# Patient Record
Sex: Female | Born: 1970
Health system: Southern US, Community
[De-identification: ages and names within clinical notes are randomized; demographics above are authoritative.]

## PROBLEM LIST (undated history)

## (undated) DIAGNOSIS — R011 Cardiac murmur, unspecified: Secondary | ICD-10-CM

## (undated) DIAGNOSIS — F32A Depression, unspecified: Secondary | ICD-10-CM

## (undated) DIAGNOSIS — F329 Major depressive disorder, single episode, unspecified: Secondary | ICD-10-CM

## (undated) DIAGNOSIS — I10 Essential (primary) hypertension: Secondary | ICD-10-CM

## (undated) DIAGNOSIS — E1159 Type 2 diabetes mellitus with other circulatory complications: Secondary | ICD-10-CM

## (undated) DIAGNOSIS — K219 Gastro-esophageal reflux disease without esophagitis: Secondary | ICD-10-CM

## (undated) DIAGNOSIS — G35 Multiple sclerosis: Secondary | ICD-10-CM

## (undated) DIAGNOSIS — Z87442 Personal history of urinary calculi: Secondary | ICD-10-CM

## (undated) DIAGNOSIS — E785 Hyperlipidemia, unspecified: Secondary | ICD-10-CM

## (undated) DIAGNOSIS — M1990 Unspecified osteoarthritis, unspecified site: Secondary | ICD-10-CM

## (undated) HISTORY — DX: Hyperlipidemia, unspecified: E78.5

## (undated) HISTORY — PX: EXPLORATORY LAPAROTOMY: SUR591

## (undated) HISTORY — DX: Essential (primary) hypertension: I10

## (undated) HISTORY — DX: Multiple sclerosis: G35

## (undated) HISTORY — PX: WISDOM TOOTH EXTRACTION: SHX21

## (undated) HISTORY — DX: Depression, unspecified: F32.A

## (undated) HISTORY — DX: Major depressive disorder, single episode, unspecified: F32.9

## (undated) HISTORY — PX: LUMBAR DISC SURGERY: SHX700

## (undated) HISTORY — DX: Cardiac murmur, unspecified: R01.1

## (undated) HISTORY — DX: Gastro-esophageal reflux disease without esophagitis: K21.9

## (undated) HISTORY — DX: Type 2 diabetes mellitus with other circulatory complications: E11.59

---

## 1998-05-23 ENCOUNTER — Emergency Department (HOSPITAL_COMMUNITY): Admission: EM | Admit: 1998-05-23 | Discharge: 1998-05-23 | Payer: Self-pay | Admitting: Internal Medicine

## 1998-08-18 ENCOUNTER — Ambulatory Visit (HOSPITAL_COMMUNITY): Admission: RE | Admit: 1998-08-18 | Discharge: 1998-08-18 | Payer: Self-pay | Admitting: Anesthesiology

## 1998-08-18 ENCOUNTER — Encounter: Payer: Self-pay | Admitting: Family Medicine

## 1999-05-05 ENCOUNTER — Encounter (INDEPENDENT_AMBULATORY_CARE_PROVIDER_SITE_OTHER): Payer: Self-pay

## 1999-05-05 ENCOUNTER — Ambulatory Visit (HOSPITAL_COMMUNITY): Admission: RE | Admit: 1999-05-05 | Discharge: 1999-05-05 | Payer: Self-pay | Admitting: Obstetrics and Gynecology

## 1999-05-29 ENCOUNTER — Encounter: Admission: RE | Admit: 1999-05-29 | Discharge: 1999-05-29 | Payer: Self-pay | Admitting: Family Medicine

## 1999-05-29 ENCOUNTER — Encounter: Payer: Self-pay | Admitting: Family Medicine

## 1999-06-02 ENCOUNTER — Encounter: Payer: Self-pay | Admitting: Family Medicine

## 1999-06-02 ENCOUNTER — Ambulatory Visit (HOSPITAL_COMMUNITY): Admission: RE | Admit: 1999-06-02 | Discharge: 1999-06-02 | Payer: Self-pay

## 1999-11-03 ENCOUNTER — Encounter (INDEPENDENT_AMBULATORY_CARE_PROVIDER_SITE_OTHER): Payer: Self-pay

## 1999-11-03 ENCOUNTER — Other Ambulatory Visit: Admission: RE | Admit: 1999-11-03 | Discharge: 1999-11-03 | Payer: Self-pay | Admitting: *Deleted

## 2000-01-15 ENCOUNTER — Other Ambulatory Visit: Admission: RE | Admit: 2000-01-15 | Discharge: 2000-01-15 | Payer: Self-pay | Admitting: *Deleted

## 2000-01-15 ENCOUNTER — Encounter (INDEPENDENT_AMBULATORY_CARE_PROVIDER_SITE_OTHER): Payer: Self-pay

## 2000-07-25 ENCOUNTER — Other Ambulatory Visit: Admission: RE | Admit: 2000-07-25 | Discharge: 2000-07-25 | Payer: Self-pay | Admitting: *Deleted

## 2001-08-29 ENCOUNTER — Other Ambulatory Visit: Admission: RE | Admit: 2001-08-29 | Discharge: 2001-08-29 | Payer: Self-pay | Admitting: *Deleted

## 2002-09-22 ENCOUNTER — Other Ambulatory Visit: Admission: RE | Admit: 2002-09-22 | Discharge: 2002-09-22 | Payer: Self-pay | Admitting: *Deleted

## 2004-07-10 ENCOUNTER — Ambulatory Visit: Payer: Self-pay | Admitting: Internal Medicine

## 2004-08-08 ENCOUNTER — Other Ambulatory Visit: Admission: RE | Admit: 2004-08-08 | Discharge: 2004-08-08 | Payer: Self-pay | Admitting: Obstetrics and Gynecology

## 2004-08-21 ENCOUNTER — Ambulatory Visit: Payer: Self-pay | Admitting: Internal Medicine

## 2004-08-28 ENCOUNTER — Ambulatory Visit: Payer: Self-pay | Admitting: Internal Medicine

## 2005-02-21 ENCOUNTER — Emergency Department (HOSPITAL_COMMUNITY): Admission: EM | Admit: 2005-02-21 | Discharge: 2005-02-21 | Payer: Self-pay | Admitting: Emergency Medicine

## 2005-02-28 ENCOUNTER — Ambulatory Visit: Payer: Self-pay | Admitting: Internal Medicine

## 2005-07-16 ENCOUNTER — Ambulatory Visit: Payer: Self-pay | Admitting: Internal Medicine

## 2006-05-07 ENCOUNTER — Ambulatory Visit: Payer: Self-pay | Admitting: Cardiology

## 2006-05-07 ENCOUNTER — Ambulatory Visit: Payer: Self-pay | Admitting: Internal Medicine

## 2006-07-04 ENCOUNTER — Ambulatory Visit: Payer: Self-pay | Admitting: Internal Medicine

## 2006-07-05 DIAGNOSIS — E119 Type 2 diabetes mellitus without complications: Secondary | ICD-10-CM | POA: Insufficient documentation

## 2006-07-05 DIAGNOSIS — I10 Essential (primary) hypertension: Secondary | ICD-10-CM | POA: Insufficient documentation

## 2006-07-05 DIAGNOSIS — N809 Endometriosis, unspecified: Secondary | ICD-10-CM | POA: Insufficient documentation

## 2006-07-05 DIAGNOSIS — G35 Multiple sclerosis: Secondary | ICD-10-CM | POA: Insufficient documentation

## 2006-07-18 ENCOUNTER — Ambulatory Visit: Payer: Self-pay | Admitting: Internal Medicine

## 2006-07-22 ENCOUNTER — Encounter: Admission: RE | Admit: 2006-07-22 | Discharge: 2006-09-05 | Payer: Self-pay | Admitting: Internal Medicine

## 2006-08-29 ENCOUNTER — Ambulatory Visit: Payer: Self-pay | Admitting: Internal Medicine

## 2006-08-29 DIAGNOSIS — E785 Hyperlipidemia, unspecified: Secondary | ICD-10-CM | POA: Insufficient documentation

## 2006-08-29 LAB — CONVERTED CEMR LAB
Cholesterol, target level: 200 mg/dL
HDL goal, serum: 40 mg/dL
LDL Goal: 100 mg/dL

## 2006-09-03 LAB — CONVERTED CEMR LAB
ALT: 37 units/L — ABNORMAL HIGH (ref 0–35)
AST: 40 units/L — ABNORMAL HIGH (ref 0–37)
Albumin: 3.3 g/dL — ABNORMAL LOW (ref 3.5–5.2)
Alkaline Phosphatase: 64 units/L (ref 39–117)
BUN: 10 mg/dL (ref 6–23)
Bilirubin, Direct: 0.1 mg/dL (ref 0.0–0.3)
CO2: 30 meq/L (ref 19–32)
Calcium: 9.2 mg/dL (ref 8.4–10.5)
Chloride: 104 meq/L (ref 96–112)
Cholesterol: 163 mg/dL (ref 0–200)
Creatinine, Ser: 0.9 mg/dL (ref 0.4–1.2)
Creatinine,U: 144.5 mg/dL
GFR calc Af Amer: 91 mL/min
GFR calc non Af Amer: 75 mL/min
Glucose, Bld: 170 mg/dL — ABNORMAL HIGH (ref 70–99)
HDL: 37.5 mg/dL — ABNORMAL LOW (ref >39.0)
Hgb A1c MFr Bld: 8.3 % — ABNORMAL HIGH (ref 4.6–6.0)
LDL Cholesterol: 86 mg/dL (ref 0–99)
Microalb Creat Ratio: 13.8 mg/g (ref 0.0–30.0)
Microalb, Ur: 2 mg/dL — ABNORMAL HIGH (ref 0.0–1.9)
Potassium: 4.3 meq/L (ref 3.5–5.1)
Sodium: 141 meq/L (ref 135–145)
Total Bilirubin: 0.9 mg/dL (ref 0.3–1.2)
Total CHOL/HDL Ratio: 4.3
Total Protein: 6.4 g/dL (ref 6.0–8.3)
Triglycerides: 198 mg/dL — ABNORMAL HIGH (ref 0–149)
VLDL: 40 mg/dL (ref 0–40)

## 2007-03-04 ENCOUNTER — Encounter: Admission: RE | Admit: 2007-03-04 | Discharge: 2007-03-04 | Payer: Self-pay | Admitting: Allergy and Immunology

## 2007-05-02 ENCOUNTER — Telehealth (INDEPENDENT_AMBULATORY_CARE_PROVIDER_SITE_OTHER): Payer: Self-pay | Admitting: *Deleted

## 2007-12-05 ENCOUNTER — Encounter: Payer: Self-pay | Admitting: Internal Medicine

## 2008-02-25 ENCOUNTER — Ambulatory Visit: Payer: Self-pay | Admitting: Internal Medicine

## 2008-02-25 DIAGNOSIS — F32A Depression, unspecified: Secondary | ICD-10-CM | POA: Insufficient documentation

## 2008-02-25 DIAGNOSIS — F329 Major depressive disorder, single episode, unspecified: Secondary | ICD-10-CM

## 2008-02-26 LAB — CONVERTED CEMR LAB
ALT: 27 units/L (ref 0–35)
AST: 28 units/L (ref 0–37)
BUN: 12 mg/dL (ref 6–23)
CO2: 30 meq/L (ref 19–32)
Calcium: 8.5 mg/dL (ref 8.4–10.5)
Chloride: 107 meq/L (ref 96–112)
Cholesterol: 175 mg/dL (ref 0–200)
Creatinine, Ser: 0.8 mg/dL (ref 0.4–1.2)
GFR calc Af Amer: 103 mL/min
GFR calc non Af Amer: 85 mL/min
Glucose, Bld: 110 mg/dL — ABNORMAL HIGH (ref 70–99)
HDL: 46.1 mg/dL (ref >39.0)
Hgb A1c MFr Bld: 6.6 % — ABNORMAL HIGH (ref 4.6–6.0)
LDL Cholesterol: 105 mg/dL — ABNORMAL HIGH (ref 0–99)
Potassium: 3.9 meq/L (ref 3.5–5.1)
Sodium: 141 meq/L (ref 135–145)
Total CHOL/HDL Ratio: 3.8
Triglycerides: 119 mg/dL (ref 0–149)
VLDL: 24 mg/dL (ref 0–40)

## 2008-03-01 ENCOUNTER — Ambulatory Visit: Payer: Self-pay | Admitting: Gastroenterology

## 2008-03-08 ENCOUNTER — Ambulatory Visit: Payer: Self-pay | Admitting: Gastroenterology

## 2008-03-09 ENCOUNTER — Telehealth: Payer: Self-pay | Admitting: Gastroenterology

## 2008-04-05 ENCOUNTER — Ambulatory Visit: Payer: Self-pay | Admitting: Gastroenterology

## 2008-05-10 ENCOUNTER — Encounter: Payer: Self-pay | Admitting: Internal Medicine

## 2008-06-29 ENCOUNTER — Ambulatory Visit: Payer: Self-pay | Admitting: Internal Medicine

## 2008-06-30 LAB — CONVERTED CEMR LAB
ALT: 27 units/L (ref 0–35)
AST: 26 units/L (ref 0–37)
Albumin: 3.1 g/dL — ABNORMAL LOW (ref 3.5–5.2)
Alkaline Phosphatase: 61 units/L (ref 39–117)
BUN: 12 mg/dL (ref 6–23)
Bilirubin, Direct: 0 mg/dL (ref 0.0–0.3)
CO2: 30 meq/L (ref 19–32)
Calcium: 8.9 mg/dL (ref 8.4–10.5)
Chloride: 103 meq/L (ref 96–112)
Cholesterol: 166 mg/dL (ref 0–200)
Creatinine, Ser: 0.8 mg/dL (ref 0.4–1.2)
GFR calc non Af Amer: 84.91 mL/min (ref >60)
Glucose, Bld: 136 mg/dL — ABNORMAL HIGH (ref 70–99)
HDL: 45.6 mg/dL (ref >39.00)
Hgb A1c MFr Bld: 6.6 % — ABNORMAL HIGH (ref 4.6–6.5)
LDL Cholesterol: 84 mg/dL (ref 0–99)
Potassium: 4.1 meq/L (ref 3.5–5.1)
Sodium: 143 meq/L (ref 135–145)
TSH: 4.79 microintl units/mL (ref 0.35–5.50)
Total Bilirubin: 0.6 mg/dL (ref 0.3–1.2)
Total CHOL/HDL Ratio: 4
Total Protein: 6.7 g/dL (ref 6.0–8.3)
Triglycerides: 180 mg/dL — ABNORMAL HIGH (ref 0.0–149.0)
VLDL: 36 mg/dL (ref 0.0–40.0)

## 2008-08-11 ENCOUNTER — Telehealth: Payer: Self-pay | Admitting: Internal Medicine

## 2008-08-13 ENCOUNTER — Telehealth: Payer: Self-pay | Admitting: Internal Medicine

## 2008-08-13 ENCOUNTER — Ambulatory Visit: Payer: Self-pay | Admitting: Internal Medicine

## 2008-09-17 ENCOUNTER — Ambulatory Visit: Payer: Self-pay | Admitting: Internal Medicine

## 2008-09-29 ENCOUNTER — Ambulatory Visit: Payer: Self-pay | Admitting: Family Medicine

## 2008-09-29 LAB — CONVERTED CEMR LAB: Rapid Strep: NEGATIVE

## 2008-10-22 ENCOUNTER — Ambulatory Visit: Payer: Self-pay | Admitting: Internal Medicine

## 2008-12-15 LAB — HM DIABETES EYE EXAM

## 2009-01-18 ENCOUNTER — Ambulatory Visit: Payer: Self-pay | Admitting: Internal Medicine

## 2009-01-18 LAB — CONVERTED CEMR LAB
CO2: 26 meq/L (ref 19–32)
Chloride: 105 meq/L (ref 96–112)
Glucose, Bld: 171 mg/dL — ABNORMAL HIGH (ref 70–99)
HDL: 43.2 mg/dL (ref >39.00)
Potassium: 4.6 meq/L (ref 3.5–5.1)
Sodium: 139 meq/L (ref 135–145)
Total Bilirubin: 0.7 mg/dL (ref 0.3–1.2)
Total CHOL/HDL Ratio: 4
VLDL: 37.8 mg/dL (ref 0.0–40.0)

## 2009-01-24 ENCOUNTER — Ambulatory Visit: Payer: Self-pay | Admitting: Internal Medicine

## 2009-06-07 ENCOUNTER — Ambulatory Visit: Payer: Self-pay | Admitting: Internal Medicine

## 2009-06-07 LAB — CONVERTED CEMR LAB
AST: 24 units/L (ref 0–37)
Albumin: 3.2 g/dL — ABNORMAL LOW (ref 3.5–5.2)
CO2: 31 meq/L (ref 19–32)
Calcium: 8.5 mg/dL (ref 8.4–10.5)
Chloride: 105 meq/L (ref 96–112)
HDL: 42.2 mg/dL (ref >39.00)
Hgb A1c MFr Bld: 7.4 % — ABNORMAL HIGH (ref 4.6–6.5)
LDL Cholesterol: 91 mg/dL (ref 0–99)
Sodium: 141 meq/L (ref 135–145)
Total CHOL/HDL Ratio: 4
Triglycerides: 159 mg/dL — ABNORMAL HIGH (ref 0.0–149.0)

## 2009-06-14 ENCOUNTER — Ambulatory Visit: Payer: Self-pay | Admitting: Internal Medicine

## 2009-07-05 ENCOUNTER — Telehealth: Payer: Self-pay | Admitting: Internal Medicine

## 2009-08-04 ENCOUNTER — Encounter: Payer: Self-pay | Admitting: Internal Medicine

## 2009-10-13 ENCOUNTER — Ambulatory Visit: Payer: Self-pay | Admitting: Internal Medicine

## 2009-10-13 LAB — CONVERTED CEMR LAB
Albumin: 3.3 g/dL — ABNORMAL LOW (ref 3.5–5.2)
BUN: 13 mg/dL (ref 6–23)
Bilirubin, Direct: 0.1 mg/dL (ref 0.0–0.3)
CO2: 29 meq/L (ref 19–32)
Calcium: 9 mg/dL (ref 8.4–10.5)
Cholesterol: 173 mg/dL (ref 0–200)
Creatinine, Ser: 0.9 mg/dL (ref 0.4–1.2)
Direct LDL: 112.1 mg/dL
Hgb A1c MFr Bld: 7.3 % — ABNORMAL HIGH (ref 4.6–6.5)
Total CHOL/HDL Ratio: 4
Total Protein: 6.6 g/dL (ref 6.0–8.3)
Triglycerides: 215 mg/dL — ABNORMAL HIGH (ref 0.0–149.0)

## 2009-10-21 ENCOUNTER — Ambulatory Visit: Payer: Self-pay | Admitting: Internal Medicine

## 2009-10-21 DIAGNOSIS — L919 Hypertrophic disorder of the skin, unspecified: Secondary | ICD-10-CM

## 2009-10-21 DIAGNOSIS — L909 Atrophic disorder of skin, unspecified: Secondary | ICD-10-CM | POA: Insufficient documentation

## 2010-01-15 LAB — HM DIABETES EYE EXAM

## 2010-02-14 NOTE — Assessment & Plan Note (Signed)
Summary: 4 MNTH ROV//SLM   Vital Signs:  Patient profile:   40 year old female Weight:      216 pounds Temp:     98.2 degrees F oral BP sitting:   122 / 90  (left arm) Cuff size:   large  Vitals Entered By: Sid Falcon LPN (October 21, 2009 8:15 AM)  Primary Care Provider:  [name], MD   History of Present Illness:  Follow-Up Visit      This is a 40 year old woman who presents for Follow-up visit.  The patient denies chest pain and palpitations.  Since the last visit the patient notes no new problems or concerns.  The patient reports taking meds as prescribed but not monitoring blood sugars.  When questioned about possible medication side effects, the patient notes none.  doesn't check CBGs regularly.  All other systems reviewed and were negative   Current Problems (verified): 1)  Depression  (ICD-311) 2)  Hyperlipidemia Nec/nos  (ICD-272.4) 3)  Endometriosis Nos  (ICD-617.9) 4)  Multiple Sclerosis  (ICD-340) 5)  Hypertension  (ICD-401.9) 6)  Diabetes Mellitus, Type II  (ICD-250.00)  Current Medications (verified): 1)  Cymbalta 60 Mg Cpep (Duloxetine Hcl) .... Take 1 Capsule By Mouth At Bedtime 2)  Hydrochlorothiazide 25 Mg Tabs (Hydrochlorothiazide) .... Take 1 Tablet By Mouth Every Morning 3)  Rebif 44 Mcg/0.78ml  Soln (Interferon Beta-1a) .... M-W-F As Directed 1/2 At Present 4)  Lybrel 90-20 Mcg  Tabs (Levonorgestrel-Ethinyl Estrad) .... One Daily 5)  Metformin Hcl 1000 Mg Tabs (Metformin Hcl) .... Take 1 Tablet By Mouth At Bedtime 6)  Provigil 200 Mg Tabs (Modafinil) .... As Needed 7)  Fexofenadine Hcl 180 Mg Tabs (Fexofenadine Hcl) .Marland Kitchen.. 1 Once Daily As Needed Allergies 8)  Insupen Ultrafin 29g X 12mm Misc (Insulin Pen Needle) 9)  Lantus Solostar 100 Unit/ml  Soln (Insulin Glargine) .... Use As Directed--38units Once Daily Subcutaneous 10)  Amlodipine Besylate 10 Mg  Tabs (Amlodipine Besylate) .... Once Daily 11)  Benazepril Hcl 20 Mg  Tabs (Benazepril Hcl) .Marland Kitchen.. 1  By Mouth Daily 12)  Victoza 18 Mg/1ml Soln (Liraglutide) .... 0.6mg  Subcutaneously Once Daily For One Week and Then 1.2 Mg Subcutaneously Once Daily  Allergies (verified): No Known Drug Allergies  Physical Exam  General:  alert and well-developed.   Head:  normocephalic and atraumatic.   Eyes:  pupils equal and pupils round.   Ears:  R ear normal and L ear normal.   Neck:  supple and full ROM.   Chest Wall:  No deformities, masses, or tenderness noted. Lungs:  normal respiratory effort and no intercostal retractions.   Heart:  normal rate and regular rhythm.   Abdomen:  Bowel sounds positive,abdomen soft and non-tender without masses, organomegaly or hernias noted.  overweight Msk:  No deformity or scoliosis noted of thoracic or lumbar spine.   Neurologic:  cranial nerves II-XII intact and gait normal.   Skin:  turgor normal and color normal.     Impression & Recommendations:  Problem # 1:  DEPRESSION (ICD-311) doing well  continue current medications  Her updated medication list for this problem includes:    Cymbalta 60 Mg Cpep (Duloxetine hcl) .Marland Kitchen... Take 1 capsule by mouth at bedtime  Problem # 2:  HYPERLIPIDEMIA NEC/NOS (ICD-272.4) no meds and adequate control Labs Reviewed: SGOT: 23 (10/13/2009)   SGPT: 23 (10/13/2009)  Lipid Goals: Chol Goal: 200 (08/29/2006)   HDL Goal: 40 (08/29/2006)   LDL Goal: 100 (08/29/2006)  TG Goal: 150 (08/29/2006)  Prior 10 Yr Risk Heart Disease: Not enough information (08/29/2006)   HDL:39.00 (10/13/2009), 42.20 (06/07/2009)  LDL:91 (06/07/2009), 104 (04/54/0981)  Chol:173 (10/13/2009), 165 (06/07/2009)  Trig:215.0 (10/13/2009), 159.0 (06/07/2009)  Problem # 3:  MULTIPLE SCLEROSIS (ICD-340) followed by neurology  Problem # 4:  DIABETES MELLITUS, TYPE II (ICD-250.00) adequate control continue current medications  Her updated medication list for this problem includes:    Metformin Hcl 1000 Mg Tabs (Metformin hcl) .Marland Kitchen... Take 1 tablet  by mouth at bedtime    Lantus Solostar 100 Unit/ml Soln (Insulin glargine) ..... Use as directed--44 units once daily subcutaneous    Benazepril Hcl 20 Mg Tabs (Benazepril hcl) .Marland Kitchen... 1 by mouth daily    Victoza 18 Mg/34ml Soln (Liraglutide) .Marland Kitchen... 0.6mg  subcutaneously once daily for one week and then 1.2 mg subcutaneously once daily  Labs Reviewed: Creat: 0.9 (10/13/2009)     Last Eye Exam: normalpt's report (12/15/2008) Reviewed HgBA1c results: 7.3 (10/13/2009)  7.4 (06/07/2009)  Problem # 5:  SKIN TAG (ICD-701.9) excised in office  Complete Medication List: 1)  Cymbalta 60 Mg Cpep (Duloxetine hcl) .... Take 1 capsule by mouth at bedtime 2)  Hydrochlorothiazide 25 Mg Tabs (Hydrochlorothiazide) .... Take 1 tablet by mouth every morning 3)  Rebif 44 Mcg/0.53ml Soln (Interferon beta-1a) .... M-w-f as directed 1/2 at present 4)  Lybrel 90-20 Mcg Tabs (Levonorgestrel-ethinyl estrad) .... One daily 5)  Metformin Hcl 1000 Mg Tabs (Metformin hcl) .... Take 1 tablet by mouth at bedtime 6)  Provigil 200 Mg Tabs (Modafinil) .... As needed 7)  Fexofenadine Hcl 180 Mg Tabs (Fexofenadine hcl) .Marland Kitchen.. 1 once daily as needed allergies 8)  Insupen Ultrafin 29g X 12mm Misc (Insulin pen needle) 9)  Lantus Solostar 100 Unit/ml Soln (Insulin glargine) .... Use as directed--44 units once daily subcutaneous 10)  Amlodipine Besylate 10 Mg Tabs (Amlodipine besylate) .... Once daily 11)  Benazepril Hcl 20 Mg Tabs (Benazepril hcl) .Marland Kitchen.. 1 by mouth daily 12)  Victoza 18 Mg/2ml Soln (Liraglutide) .... 0.6mg  subcutaneously once daily for one week and then 1.2 mg subcutaneously once daily  Patient Instructions: 1)  Please schedule a follow-up appointment in 4 months. 2)  labs one week prior to visit 3)  lipids---272.4 4)  lfts-995.2 5)  bmet-995.2 6)  A1C-250.02 7)

## 2010-02-14 NOTE — Assessment & Plan Note (Signed)
Summary: 3 mo rov/mm   Vital Signs:  Patient profile:   40 year old female Weight:      220 pounds Temp:     97.8 degrees F Pulse rate:   76 / minute Resp:     12 per minute BP sitting:   128 / 94  (left arm)  Vitals Entered By: Gladis Riffle, RN (January 24, 2009 8:43 AM)  Serial Vital Signs/Assessments:  Time      Position  BP       Pulse  Resp  Temp     By                     120/84                         [name] MD    Primary Care Provider:  [name], MD   History of Present Illness:  Follow-Up Visit      This is a 40 year old woman who presents for Follow-up visit.  The patient denies chest pain, palpitations, dizziness, syncope, edema, SOB, DOE, PND, and orthopnea.  Since the last visit the patient notes being seen by a specialist.  The patient reports taking meds as prescribed.  When questioned about possible medication side effects, the patient notes none.    Sees Dr. Epimenio Foot regularly for MS follow up she tells me that LFTS have been elevated with transaminases in the "90s"  All other systems reviewed and were negative   Preventive Screening-Counseling & Management  Alcohol-Tobacco     Alcohol drinks/day: <1     Smoking Status: never  Current Problems (verified): 1)  Depression  (ICD-311) 2)  Hyperlipidemia Nec/nos  (ICD-272.4) 3)  Family History Diabetes 1st Degree Relative  (ICD-V18.0) 4)  Family History Breast Cancer 1st Degree Relative <50  (ICD-V16.3) 5)  Endometriosis Nos  (ICD-617.9) 6)  Multiple Sclerosis  (ICD-340) 7)  Hypertension  (ICD-401.9) 8)  Diabetes Mellitus, Type II  (ICD-250.00)  Current Medications (verified): 1)  Cymbalta 60 Mg Cpep (Duloxetine Hcl) .... Take 1 Capsule By Mouth At Bedtime 2)  Hydrochlorothiazide 25 Mg Tabs (Hydrochlorothiazide) .... Take 1 Tablet By Mouth Every Morning 3)  Lotrel 10-20 Mg Caps (Amlodipine Besy-Benazepril Hcl) .... Take 1 Capsule By Mouth Once A Day 4)  Rebif 44 Mcg/0.41ml  Soln (Interferon  Beta-1a) .... M-W-F As Directed 1/2 At Present 5)  Lybrel 90-20 Mcg  Tabs (Levonorgestrel-Ethinyl Estrad) .... One Daily 6)  Metformin Hcl 1000 Mg Tabs (Metformin Hcl) .... Take 1 Tablet By Mouth At Bedtime 7)  Provigil 200 Mg Tabs (Modafinil) .... As Needed 8)  Fexofenadine Hcl 180 Mg Tabs (Fexofenadine Hcl) .Marland Kitchen.. 1 Once Daily As Needed Allergies 9)  Insupen Ultrafin 29g X 12mm Misc (Insulin Pen Needle) 10)  Lantus Solostar 100 Unit/ml  Soln (Insulin Glargine) .... Use As Directed--26u Once Daily Subcutaneous  Allergies (verified): No Known Drug Allergies  Comments:  Nurse/Medical Assistant: 3 month rov--CBGs not done at home as needs battery--labs done  The patient's medications and allergies were reviewed with the patient and were updated in the Medication and Allergy Lists. Gladis Riffle, RN (January 24, 2009 8:44 AM)  Past History:  Past Medical History: Last updated: 02/25/2008 Diabetes mellitus, type II Hypertension multiple sclerosis endometriosis Depression  Past Surgical History: Last updated: 07/05/2006 discectomy LS spine wisdom teeth Laparotomy-exploratory  Family History: Last updated: 03/01/2008 mother-dm, breast ca Family History Breast cancer 1st degree relative <  50 Family History Diabetes 1st degree relative father-died progressive supranuclear palsy 41 yo, dm No FH of Colon Cancer:  Social History: Last updated: 04/05/2008 Occupation: [employer] Single Never Smoked Alcohol use-yes Regular exercise-no Illicit Drug Use - no  Risk Factors: Alcohol Use: <1 (01/24/2009) Exercise: no (08/29/2006)  Risk Factors: Smoking Status: never (01/24/2009)  Physical Exam  General:  patient is alert nontoxic in appearance Head:  normocephalic and atraumatic.   Ears:  R ear normal and L ear normal.   Neck:  supple with tender enlarged anterior cervical nodes bilaterally Lungs:  Normal respiratory effort, chest expands symmetrically. Lungs are clear  to auscultation, no crackles or wheezes. Heart:  Normal rate and regular rhythm. S1 and S2 normal without gallop, murmur, click, rub or other extra sounds. Abdomen:  Bowel sounds positive,abdomen soft and non-tender without masses, organomegaly or hernias noted.  overweight Msk:  No deformity or scoliosis noted of thoracic or lumbar spine.   Pulses:  R radial normal and L radial normal.   Skin:  turgor normal and color normal.    Diabetes Management Exam:    Eye Exam:       Eye Exam done elsewhere          Date: 12/15/2008          Results: normalpt's report          Done by: ophthal   Impression & Recommendations:  Problem # 1:  DIABETES MELLITUS, TYPE II (ICD-250.00) Assessment Deteriorated she has not done well with diet and exercise she is going to reemphasize diet I will not change meds Her updated medication list for this problem includes:    Lotrel 10-20 Mg Caps (Amlodipine besy-benazepril hcl) .Marland Kitchen... Take 1 capsule by mouth once a day    Metformin Hcl 1000 Mg Tabs (Metformin hcl) .Marland Kitchen... Take 1 tablet by mouth at bedtime    Lantus Solostar 100 Unit/ml Soln (Insulin glargine) ..... Use as directed--26u once daily subcutaneous  Problem # 2:  MULTIPLE SCLEROSIS (ICD-340) followed by dr Epimenio Foot  Problem # 3:  HYPERTENSION (ICD-401.9)  see serial assessment Her updated medication list for this problem includes:    Hydrochlorothiazide 25 Mg Tabs (Hydrochlorothiazide) .Marland Kitchen... Take 1 tablet by mouth every morning    Lotrel 10-20 Mg Caps (Amlodipine besy-benazepril hcl) .Marland Kitchen... Take 1 capsule by mouth once a day  BP today: 128/94 Prior BP: 122/64 (10/22/2008)  Prior 10 Yr Risk Heart Disease: Not enough information (08/29/2006)  Labs Reviewed: K+: 4.6 (01/18/2009) Creat: : 0.8 (01/18/2009)   Chol: 185 (01/18/2009)   HDL: 43.20 (01/18/2009)   LDL: 104 (01/18/2009)   TG: 189.0 (01/18/2009)  Problem # 4:  HYPERLIPIDEMIA NEC/NOS (ICD-272.4) discussed potential need for  treatment Labs Reviewed: SGOT: 40 (01/18/2009)   SGPT: 43 (01/18/2009)  Lipid Goals: Chol Goal: 200 (08/29/2006)   HDL Goal: 40 (08/29/2006)   LDL Goal: 100 (08/29/2006)   TG Goal: 150 (08/29/2006)  Prior 10 Yr Risk Heart Disease: Not enough information (08/29/2006)   HDL:43.20 (01/18/2009), 45.60 (06/29/2008)  LDL:104 (01/18/2009), 84 (28/41/3244)  Chol:185 (01/18/2009), 166 (06/29/2008)  Trig:189.0 (01/18/2009), 180.0 (06/29/2008)  Complete Medication List: 1)  Cymbalta 60 Mg Cpep (Duloxetine hcl) .... Take 1 capsule by mouth at bedtime 2)  Hydrochlorothiazide 25 Mg Tabs (Hydrochlorothiazide) .... Take 1 tablet by mouth every morning 3)  Lotrel 10-20 Mg Caps (Amlodipine besy-benazepril hcl) .... Take 1 capsule by mouth once a day 4)  Rebif 44 Mcg/0.43ml Soln (Interferon beta-1a) .... M-w-f as directed 1/2  at present 5)  Lybrel 90-20 Mcg Tabs (Levonorgestrel-ethinyl estrad) .... One daily 6)  Metformin Hcl 1000 Mg Tabs (Metformin hcl) .... Take 1 tablet by mouth at bedtime 7)  Provigil 200 Mg Tabs (Modafinil) .... As needed 8)  Fexofenadine Hcl 180 Mg Tabs (Fexofenadine hcl) .Marland Kitchen.. 1 once daily as needed allergies 9)  Insupen Ultrafin 29g X 12mm Misc (Insulin pen needle) 10)  Lantus Solostar 100 Unit/ml Soln (Insulin glargine) .... Use as directed--26u once daily subcutaneous  Patient Instructions: 1)  Please schedule a follow-up appointment in 4 months. 2)  labs one week prior to visit 3)  lipids---272.4 4)  lfts-995.2 5)  bmet-995.2 6)  A1C-250.02 7)

## 2010-02-14 NOTE — Progress Notes (Signed)
Summary: Victoza  Phone Note Call from Patient   Caller: Patient Call For: [name] MD Summary of Call: [phone] Pt went to a DM class and would like to try Victoza if Dr. Cato Mulligan would agree? Initial call taken by: Lynann Beaver CMA,  July 05, 2009 3:08 PM  Follow-up for Phone Call        have her review Byetta---I use it more frequently Follow-up by: [name] MD,  July 06, 2009 8:14 AM     Appended Document: Victoza LMTCB  Appended Document: Victoza Pt advised.  Appended Document: Victoza Pt would still like to try victoza as byetta is two times a day and said to cause more nausea.  Pharmacy is [facility].  Appended Document: Victoza    Clinical Lists Changes  Medications: Added new medication of VICTOZA 18 MG/3ML SOLN (LIRAGLUTIDE) 0.6mg  Subcutaneously once daily for one week and then 1.2 mg Subcutaneously once daily - Signed Rx of VICTOZA 18 MG/3ML SOLN (LIRAGLUTIDE) 0.6mg  Subcutaneously once daily for one week and then 1.2 mg Subcutaneously once daily;  #1 x PRN;  Signed;  Entered by: [name] MD;  Authorized by: [name] MD;  Method used: [facility]ically to [facility]  [phone]*, [address], [place], [place]  [address], Ph: [phone] or [phone], Fax: [phone]    Prescriptions: VICTOZA 18 MG/3ML SOLN (LIRAGLUTIDE) 0.6mg  Subcutaneously once daily for one week and then 1.2 mg Subcutaneously once daily  #1 x PRN   Entered and Authorized by:   [name] MD   Signed by:   [name] MD on 07/08/2009   Method used:   [facility]ically to        [facility]  [phone]* (retail)       [address] [place], [place]  [address]       Ph: [phone] or [phone]       Fax: [phone]   RxID:   [id number]

## 2010-02-14 NOTE — Assessment & Plan Note (Signed)
Summary: 4 month rov/njr   Vital Signs:  Patient profile:   40 year old female Weight:      221 pounds Temp:     98.2 degrees F oral Pulse rate:   81 / minute Resp:     12 per minute BP sitting:   110 / 80  Vitals Entered By: Lynann Beaver CMA (Jun 14, 2009 8:31 AM) CC: 4 month rechek Is Patient Diabetic? Yes Pain Assessment Patient in pain? no        Primary Care Provider:  [name], MD  CC:  4 month rechek.  History of Present Illness:  Follow-Up Visit      This is a 40 year old woman who presents for Follow-up visit.  The patient denies chest pain and palpitations.  Since the last visit the patient notes no new problems or concerns and being seen by a specialist.  The patient reports taking meds as prescribed and monitoring blood sugars.  When questioned about possible medication side effects, the patient notes none.  Has regular f/u with neurology  All other systems reviewed and were negative except for chronic fatigue and chronic neurologic complaints she relates to MS  All other systems reviewed and were negative   Current Problems (verified): 1)  Depression  (ICD-311) 2)  Hyperlipidemia Nec/nos  (ICD-272.4) 3)  Family History Diabetes 1st Degree Relative  (ICD-V18.0) 4)  Family History Breast Cancer 1st Degree Relative <50  (ICD-V16.3) 5)  Endometriosis Nos  (ICD-617.9) 6)  Multiple Sclerosis  (ICD-340) 7)  Hypertension  (ICD-401.9) 8)  Diabetes Mellitus, Type II  (ICD-250.00)  Current Medications (verified): 1)  Cymbalta 60 Mg Cpep (Duloxetine Hcl) .... Take 1 Capsule By Mouth At Bedtime 2)  Hydrochlorothiazide 25 Mg Tabs (Hydrochlorothiazide) .... Take 1 Tablet By Mouth Every Morning 3)  Lotrel 10-20 Mg Caps (Amlodipine Besy-Benazepril Hcl) .... Take 1 Capsule By Mouth Once A Day 4)  Rebif 44 Mcg/0.51ml  Soln (Interferon Beta-1a) .... M-W-F As Directed 1/2 At Present 5)  Lybrel 90-20 Mcg  Tabs (Levonorgestrel-Ethinyl Estrad) .... One Daily 6)  Metformin  Hcl 1000 Mg Tabs (Metformin Hcl) .... Take 1 Tablet By Mouth At Bedtime 7)  Provigil 200 Mg Tabs (Modafinil) .... As Needed 8)  Fexofenadine Hcl 180 Mg Tabs (Fexofenadine Hcl) .Marland Kitchen.. 1 Once Daily As Needed Allergies 9)  Insupen Ultrafin 29g X 12mm Misc (Insulin Pen Needle) 10)  Lantus Solostar 100 Unit/ml  Soln (Insulin Glargine) .... Use As Directed--38units Once Daily Subcutaneous  Allergies (verified): No Known Drug Allergies  Past History:  Past Medical History: Last updated: 02/25/2008 Diabetes mellitus, type II Hypertension multiple sclerosis endometriosis Depression  Past Surgical History: Last updated: 07/05/2006 discectomy LS spine wisdom teeth Laparotomy-exploratory  Family History: Last updated: 03/01/2008 mother-dm, breast ca Family History Breast cancer 1st degree relative <50 Family History Diabetes 1st degree relative father-died progressive supranuclear palsy 41 yo, dm No FH of Colon Cancer:  Social History: Last updated: 04/05/2008 Occupation: [employer] fast [employer] Single Never Smoked Alcohol use-yes Regular exercise-no Illicit Drug Use - no  Risk Factors: Alcohol Use: <1 (01/24/2009) Exercise: no (08/29/2006)  Risk Factors: Smoking Status: never (01/24/2009)  Physical Exam  General:  patient is alert nontoxic in appearance Head:  normocephalic and atraumatic.   Eyes:  pupils equal and pupils round.   Ears:  R ear normal and L ear normal.   Neck:  supple and full ROM.   Lungs:  normal respiratory effort and no intercostal retractions.   Heart:  normal rate and regular rhythm.   Abdomen:  Bowel sounds positive,abdomen soft and non-tender without masses, organomegaly or hernias noted.  overweight Msk:  No deformity or scoliosis noted of thoracic or lumbar spine.   Neurologic:  cranial nerves II-XII intact and gait normal.   Skin:  turgor normal and color normal.   Cervical Nodes:  no anterior cervical adenopathy and no posterior cervical  adenopathy.   Psych:  normally interactive and good eye contact.     Impression & Recommendations:  Problem # 1:  MULTIPLE SCLEROSIS (ICD-340) has regular f/u with neurology  Problem # 2:  HYPERTENSION (ICD-401.9) controlled  continue current medications  The following medications were removed from the medication list:    Lotrel 10-20 Mg Caps (Amlodipine besy-benazepril hcl) .Marland Kitchen... Take 1 capsule by mouth once a day Her updated medication list for this problem includes:    Hydrochlorothiazide 25 Mg Tabs (Hydrochlorothiazide) .Marland Kitchen... Take 1 tablet by mouth every morning    Amlodipine Besylate 10 Mg Tabs (Amlodipine besylate) ..... Once daily    Benazepril Hcl 20 Mg Tabs (Benazepril hcl) .Marland Kitchen... 1 by mouth daily  BP today: 110/80 Prior BP: 128/94 (01/24/2009)  Prior 10 Yr Risk Heart Disease: Not enough information (08/29/2006)  Labs Reviewed: K+: 4.3 (06/07/2009) Creat: : 0.7 (06/07/2009)   Chol: 165 (06/07/2009)   HDL: 42.20 (06/07/2009)   LDL: 91 (06/07/2009)   TG: 159.0 (06/07/2009)  Problem # 3:  DIABETES MELLITUS, TYPE II (ICD-250.00) Assessment: Improved better advised weight loss, diet. continue current medications except change lotrel ($) The following medications were removed from the medication list:    Lotrel 10-20 Mg Caps (Amlodipine besy-benazepril hcl) .Marland Kitchen... Take 1 capsule by mouth once a day Her updated medication list for this problem includes:    Metformin Hcl 1000 Mg Tabs (Metformin hcl) .Marland Kitchen... Take 1 tablet by mouth at bedtime    Lantus Solostar 100 Unit/ml Soln (Insulin glargine) ..... Use as directed--38units once daily subcutaneous    Benazepril Hcl 20 Mg Tabs (Benazepril hcl) .Marland Kitchen... 1 by mouth daily  Labs Reviewed: Creat: 0.7 (06/07/2009)     Last Eye Exam: normalpt's report (12/15/2008) Reviewed HgBA1c results: 7.4 (06/07/2009)  8.4 (01/18/2009)  Complete Medication List: 1)  Cymbalta 60 Mg Cpep (Duloxetine hcl) .... Take 1 capsule by mouth at  bedtime 2)  Hydrochlorothiazide 25 Mg Tabs (Hydrochlorothiazide) .... Take 1 tablet by mouth every morning 3)  Rebif 44 Mcg/0.59ml Soln (Interferon beta-1a) .... M-w-f as directed 1/2 at present 4)  Lybrel 90-20 Mcg Tabs (Levonorgestrel-ethinyl estrad) .... One daily 5)  Metformin Hcl 1000 Mg Tabs (Metformin hcl) .... Take 1 tablet by mouth at bedtime 6)  Provigil 200 Mg Tabs (Modafinil) .... As needed 7)  Fexofenadine Hcl 180 Mg Tabs (Fexofenadine hcl) .Marland Kitchen.. 1 once daily as needed allergies 8)  Insupen Ultrafin 29g X 12mm Misc (Insulin pen needle) 9)  Lantus Solostar 100 Unit/ml Soln (Insulin glargine) .... Use as directed--38units once daily subcutaneous 10)  Amlodipine Besylate 10 Mg Tabs (Amlodipine besylate) .... Once daily 11)  Benazepril Hcl 20 Mg Tabs (Benazepril hcl) .Marland Kitchen.. 1 by mouth daily  Patient Instructions: 1)  Please schedule a follow-up appointment in 4 months. 2)  labs one week prior to visit 3)  lipids---272.4 4)  lfts-995.2 5)  bmet-995.2 6)  A1C-250.02 7)     Prescriptions: BENAZEPRIL HCL 20 MG  TABS (BENAZEPRIL HCL) 1 by mouth daily  #90 x 3   Entered and Authorized by:   [name] MD  Signed by:   [name] MD on 06/14/2009   Method used:   Electronically to        [facility]* (mail-order)             ,          Ph: [phone]       Fax: [phone]   RxID:   [id number] AMLODIPINE BESYLATE 10 MG  TABS (AMLODIPINE BESYLATE) once daily  #90 x 3   Entered and Authorized by:   [name] MD   Signed by:   [name] MD on 06/14/2009   Method used:   Electronically to        [facility]* (mail-order)             ,          Ph: [phone]       Fax: [phone]   RxID:   [id number]

## 2010-02-14 NOTE — Letter (Signed)
Summary: [facility]  [facility]   Imported By: Maryln Gottron 08/18/2009 10:55:23  _____________________________________________________________________  External Attachment:    Type:   Image     Comment:   External Document

## 2010-02-17 ENCOUNTER — Ambulatory Visit: Admit: 2010-02-17 | Payer: Self-pay | Admitting: Internal Medicine

## 2010-02-17 ENCOUNTER — Other Ambulatory Visit: Payer: BC Managed Care – PPO | Admitting: Internal Medicine

## 2010-02-17 DIAGNOSIS — E119 Type 2 diabetes mellitus without complications: Secondary | ICD-10-CM

## 2010-02-17 DIAGNOSIS — E785 Hyperlipidemia, unspecified: Secondary | ICD-10-CM

## 2010-02-17 DIAGNOSIS — I10 Essential (primary) hypertension: Secondary | ICD-10-CM

## 2010-02-17 DIAGNOSIS — T887XXA Unspecified adverse effect of drug or medicament, initial encounter: Secondary | ICD-10-CM

## 2010-02-17 LAB — LIPID PANEL
Cholesterol: 170 mg/dL (ref 0–200)
HDL: 39.2 mg/dL (ref >39.00)
LDL Cholesterol: 93 mg/dL (ref 0–99)
Total CHOL/HDL Ratio: 4
Triglycerides: 190 mg/dL — ABNORMAL HIGH (ref 0.0–149.0)
VLDL: 38 mg/dL (ref 0.0–40.0)

## 2010-02-17 LAB — BASIC METABOLIC PANEL
CO2: 29 mEq/L (ref 19–32)
Calcium: 8.9 mg/dL (ref 8.4–10.5)
Creatinine, Ser: 0.9 mg/dL (ref 0.4–1.2)
GFR: 78.51 mL/min (ref >60.00)
Sodium: 142 mEq/L (ref 135–145)

## 2010-02-17 LAB — HEPATIC FUNCTION PANEL
Alkaline Phosphatase: 75 U/L (ref 39–117)
Bilirubin, Direct: 0 mg/dL (ref 0.0–0.3)
Total Bilirubin: 0.1 mg/dL — ABNORMAL LOW (ref 0.3–1.2)

## 2010-02-20 ENCOUNTER — Other Ambulatory Visit: Payer: Self-pay | Admitting: Internal Medicine

## 2010-03-06 ENCOUNTER — Encounter: Payer: Self-pay | Admitting: Internal Medicine

## 2010-03-07 ENCOUNTER — Encounter: Payer: Self-pay | Admitting: Internal Medicine

## 2010-03-07 ENCOUNTER — Ambulatory Visit (INDEPENDENT_AMBULATORY_CARE_PROVIDER_SITE_OTHER): Payer: BC Managed Care – PPO | Admitting: Internal Medicine

## 2010-03-07 DIAGNOSIS — G35 Multiple sclerosis: Secondary | ICD-10-CM

## 2010-03-07 DIAGNOSIS — E785 Hyperlipidemia, unspecified: Secondary | ICD-10-CM

## 2010-03-07 DIAGNOSIS — I10 Essential (primary) hypertension: Secondary | ICD-10-CM

## 2010-03-07 DIAGNOSIS — E119 Type 2 diabetes mellitus without complications: Secondary | ICD-10-CM

## 2010-03-07 MED ORDER — EXENATIDE 5 MCG/0.02ML ~~LOC~~ SOPN: PEN_INJECTOR | SUBCUTANEOUS | Status: DC

## 2010-03-07 MED ORDER — INSULIN GLARGINE 100 UNIT/ML ~~LOC~~ SOLN: 50.0000 [IU] | Freq: Every day | SUBCUTANEOUS | Status: DC

## 2010-03-11 NOTE — Assessment & Plan Note (Signed)
She has regular followup with neurology. Continue current medications.

## 2010-03-11 NOTE — Assessment & Plan Note (Signed)
Tolerating medications. Coninue curent medications.

## 2010-03-11 NOTE — Assessment & Plan Note (Signed)
Poor control. Needs additional therapy. More importantly needs progressive weight loss, diet and exercise therapy. She needs to concentrate on aggressive weight loss. In the meantime will change medications. See medication list. Side effects discussed.

## 2010-03-11 NOTE — Progress Notes (Signed)
  Subjective:    Patient ID: Kristin Mathews, female    DOB: 03/15/1970, 40 y.o.   MRN: [id number]  HPI   patient comes in for followup of multiple medical problems including type 2 diabetes, hyperlipidemia, hypertension. The patient does not check blood sugar or blood pressure at home. The patetient does not follow an exercise or diet program. The patient denies any polyuria, polydipsia.  In the past the patient has gone to diabetic treatment center. The patient is tolerating medications  Without difficulty. The patient does admit to medication compliance.   Past Medical History  Diagnosis Date  . Diabetes mellitus     Type 2  . Hypertension   . Multiple sclerosis   . Endometriosis   . Depression    Past Surgical History  Procedure Date  . Lumbar disc surgery     LS spine  . Wisdom tooth extraction   . Exploratory laparotomy     reports that she has never smoked. She does not have any smokeless tobacco history on file. She reports that she drinks alcohol. She reports that she does not use illicit drugs. family history includes Cancer in her mother; Diabetes in her father and mother; and Other in her father.    No Known Allergies   Review of Systems  patient denies chest pain, shortness of breath, orthopnea. Denies lower extremity edema, abdominal pain, change in appetite, change in bowel movements. Patient denies rashes, musculoskeletal complaints. No other specific complaints in a complete review of systems.      Objective:   Physical Exam  Well-developed well-nourished female in no acute distress. HEENT exam atraumatic, normocephalic, extraocular muscles are intact. Neck is supple. No jugular venous distention no thyromegaly. Chest clear to auscultation without increased work of breathing. Cardiac exam S1 and S2 are regular. Abdominal exam active bowel sounds, soft, nontende, obeser. Extremities no edema. Neurologic exam she is alert without any motor sensory deficits. Gait is  normal.        Assessment & Plan:

## 2010-03-11 NOTE — Assessment & Plan Note (Signed)
Well-controlled. Contine current medications.

## 2010-04-25 ENCOUNTER — Telehealth: Payer: Self-pay | Admitting: *Deleted

## 2010-04-25 MED ORDER — INSULIN NPH ISOPHANE & REGULAR (70-30) 100 UNIT/ML ~~LOC~~ SUSP: 12.0000 [IU] | Freq: Two times a day (BID) | SUBCUTANEOUS | Status: DC

## 2010-04-25 NOTE — Telephone Encounter (Signed)
D/c byetta Start insulin (humalin) 70/30 12 units with breakfast and dinner

## 2010-04-25 NOTE — Telephone Encounter (Addendum)
Pt is concerned that the Byetta is not helping control her BS.  After lunch, it was 220, and her FBSs are running in the 90's.  Interested in insulin.  [facility] ([address])  DC Lantus Solostar???

## 2010-04-26 NOTE — Telephone Encounter (Signed)
Per Dr. Cato Mulligan DC Lantus Solastar.

## 2010-04-26 NOTE — Telephone Encounter (Signed)
Notified pt. 

## 2010-05-29 ENCOUNTER — Other Ambulatory Visit: Payer: Self-pay | Admitting: Internal Medicine

## 2010-06-02 NOTE — Op Note (Signed)
[facility]  Patient:    Kristin Mathews, Kristin Mathews                       MRN: [id number] Adm. Date:  [id number] Attending:  Leonard Schwartz CC:         Dario Guardian, M.D.                           Operative Report  PREOPERATIVE DIAGNOSIS:       Worsening dysmenorrhea.  POSTOPERATIVE DIAGNOSES:      1. Worsening dysmenorrhea.                               2. Endometriosis.                               3. Pelvic adhesions.  PROCEDURE:                    1. Diagnostic laparoscopy.                               2. Laparoscopic lysis of adhesions.                               3. Laparoscopic pelvic biopsies.                               4. Laparoscopic ablation of endometriosis.                               5. ______.  SURGEON:                      Janine Limbo, M.D.  ANESTHESIA:                   General.  DISPOSITION:                  The patient is a 40 year old female gravida 0 who  presents with worsening dysmenorrhea.  She wants to proceed with laparoscopy. he understands the indications for her procedure and she accepts the risks of, but not limited to, anesthetic complications, bleeding, infection, and possible damage o the surrounding organs.  She understands that no guarantees can be given concerning the total relief of her discomfort.  FINDINGS:                     The uterus was normal size and retroflexed.  The fallopian tubes were slightly dilated, but otherwise were normal.  The fimbriated ends of the fallopian tubes were delicate.  The ovaries were slightly larger than average, but again were normal.  The right ovary did have a sign of a recent ovulation.  They were somewhat smooth and pearly white and there is a question f whether or not this patient may have polycystic ovary syndrome.  There were adhesions between the left ovary and the left posterior cul-de-sac.  There was  hyperpigmented lesion measuring 0.5 x  1 cm in the posterior cul-de-sac to the right of the midline.  This  seems consistent with endometriosis.  On the right broad ligament posterior to the fundus of the uterus there were several hyperpigmented lesions measuring less than 0.3 mm in size.  These were felt to be consistent with endometriosis.  The fallopian tubes did fill, although slowly, with dye.  They seemed slightly dilated once the dye entered the tube.  Dye did spill from both  fimbriated ends of the fallopian tubes.  The appendix and the bowel appeared normal.  The upper abdomen appeared normal.  PROCEDURE:                    The patient was taken to the operating room where a general anesthetic was given.  The patients abdomen, perineum, and vagina were prepped with multiple layers of Betadine.  An acorn cannula was placed inside the uterus and Foley catheter was placed in the ______.  The patient was sterilely draped.  The subumbilical area was injected with 4 cc of .25% Marcaine and an incision was made.  The Veress needle was inserted.  Proper placement was confirmed using the saline drop test.  A pneumoperitoneum was obtained.  The laparoscopic  trocar and then the laparoscope were substituted for the Veress needle.  The pelvic structures were visualized with findings as mentioned above.  Two suprapubic incisions were made in the lower abdomen after the skin was injected with .25% MOROCCAN.  Two 5 mm probes were inserted.  Pictures were taken of the patients pelvic and abdominal structures.  The adhesions in the left posterior cul-de-sac were lysed.  Biopsies were taken from the hyperpigmented area consistent with endometriosis.  The remaining hyperpigmented layers were ablated using the bipolar cautery.  Care was taken not to damage the underlying or surrounding structures. Dye was then injected through the acorn cannula and although it was somewhat difficult to fill the fallopian tubes, the tubes did fill  and dye was noted to spill bilaterally.  The pelvis was then vigorously irrigated.  A final check was made for hemostasis and hemostasis was again confirmed to be adequate.  The bowel was checked carefully and there was no evidence of damage from the trocar or other instruments.  The pneumoperitoneum was allowed to escape.  All instruments were  removed.  The incisions were closed using deep and superficial sutures of 4-0 Vicryl.  Sponge, needle, and instrument counts were correct on two occasions. he estimated blood loss was 20 cc.  The patient tolerated her procedure well.  She was awakened from her anesthetic and taken to the recovery room in stable condition.  FOLLOW-UP INSTRUCTIONS:       The patient was given Demerol by her request 50 mg-100 mg q.4h. as needed for pain.  She was given 30 tablets with no refills. She will use ibuprofen or acetaminophen for her discomfort if they will suffice. She was also given a copy of the postoperative instruction sheet as prepared by the [facility] for patients who have undergone laparoscopy.  The  patient will return to see Dr. Stefano Gaul in two weeks for follow-up examination. DD:  05/05/99 TD:  05/07/99 Job: [id number]

## 2010-07-04 ENCOUNTER — Other Ambulatory Visit (INDEPENDENT_AMBULATORY_CARE_PROVIDER_SITE_OTHER): Payer: BC Managed Care – PPO

## 2010-07-04 DIAGNOSIS — Z0000 Encounter for general adult medical examination without abnormal findings: Secondary | ICD-10-CM

## 2010-07-04 LAB — MICROALBUMIN / CREATININE URINE RATIO
Creatinine,U: 169 mg/dL
Microalb Creat Ratio: 1.8 mg/g (ref 0.0–30.0)

## 2010-07-04 LAB — CBC WITH DIFFERENTIAL/PLATELET
Basophils Absolute: 0 10*3/uL (ref 0.0–0.1)
Basophils Relative: 0.5 % (ref 0.0–3.0)
Eosinophils Absolute: 0.4 10*3/uL (ref 0.0–0.7)
Hemoglobin: 13.6 g/dL (ref 12.0–15.0)
Lymphs Abs: 3.2 10*3/uL (ref 0.7–4.0)
MCHC: 33.1 g/dL (ref 30.0–36.0)
MCV: 90.1 fl (ref 78.0–100.0)
Monocytes Absolute: 0.5 10*3/uL (ref 0.1–1.0)
Neutro Abs: 3.6 10*3/uL (ref 1.4–7.7)
RBC: 4.54 Mil/uL (ref 3.87–5.11)
RDW: 14.1 % (ref 11.5–14.6)

## 2010-07-04 LAB — HEPATIC FUNCTION PANEL
Albumin: 3.5 g/dL (ref 3.5–5.2)
Total Protein: 6.6 g/dL (ref 6.0–8.3)

## 2010-07-04 LAB — BASIC METABOLIC PANEL
CO2: 28 mEq/L (ref 19–32)
Chloride: 105 mEq/L (ref 96–112)
Creatinine, Ser: 0.9 mg/dL (ref 0.4–1.2)
Glucose, Bld: 156 mg/dL — ABNORMAL HIGH (ref 70–99)
Sodium: 141 mEq/L (ref 135–145)

## 2010-07-04 LAB — POCT URINALYSIS DIPSTICK
Glucose, UA: NEGATIVE
Leukocytes, UA: NEGATIVE
Nitrite, UA: NEGATIVE
Urobilinogen, UA: 0.2

## 2010-07-04 LAB — HEMOGLOBIN A1C: Hgb A1c MFr Bld: 7.3 % — ABNORMAL HIGH (ref 4.6–6.5)

## 2010-07-04 LAB — LIPID PANEL
Cholesterol: 187 mg/dL (ref 0–200)
HDL: 40.8 mg/dL (ref >39.00)
Triglycerides: 176 mg/dL — ABNORMAL HIGH (ref 0.0–149.0)

## 2010-07-11 ENCOUNTER — Ambulatory Visit (INDEPENDENT_AMBULATORY_CARE_PROVIDER_SITE_OTHER): Payer: BC Managed Care – PPO | Admitting: Internal Medicine

## 2010-07-11 ENCOUNTER — Encounter: Payer: Self-pay | Admitting: Internal Medicine

## 2010-07-11 VITALS — BP 114/80 | HR 94 | Temp 97.6°F | Ht 66.0 in | Wt 222.0 lb

## 2010-07-11 DIAGNOSIS — I10 Essential (primary) hypertension: Secondary | ICD-10-CM

## 2010-07-11 DIAGNOSIS — E119 Type 2 diabetes mellitus without complications: Secondary | ICD-10-CM

## 2010-07-11 DIAGNOSIS — E785 Hyperlipidemia, unspecified: Secondary | ICD-10-CM

## 2010-07-11 NOTE — Assessment & Plan Note (Signed)
Adequate control. Continue current medications. 

## 2010-07-11 NOTE — Assessment & Plan Note (Signed)
She is not on any medications. I'd like her LDL less than 100. I gave her 4 months to accomplish this. She'll concentrate on weight loss, diet and exercise.

## 2010-07-11 NOTE — Progress Notes (Signed)
  Subjective:    Patient ID: Kristin Mathews, female    DOB: 12-Mar-1970, 40 y.o.   MRN: [id number]  HPI  patient comes in for followup of multiple medical problems including type 2 diabetes, hyperlipidemia, hypertension. The patient does not check blood sugar or blood pressure at home. The patetient does not follow an exercise or diet program. The patient denies any polyuria, polydipsia.  In the past the patient has gone to diabetic treatment center. The patient is tolerating medications  Without difficulty. The patient does admit to medication compliance.  Past Medical History  Diagnosis Date  . Diabetes mellitus     Type 2  . Hypertension   . Multiple sclerosis   . Endometriosis   . Depression    Past Surgical History  Procedure Date  . Lumbar disc surgery     LS spine  . Wisdom tooth extraction   . Exploratory laparotomy     reports that she has never smoked. She does not have any smokeless tobacco history on file. She reports that she drinks alcohol. She reports that she does not use illicit drugs. family history includes Cancer in her mother; Diabetes in her father and mother; and Other in her father. No Known Allergies    Review of Systems  patient denies chest pain, shortness of breath, orthopnea. Denies lower extremity edema, abdominal pain, change in appetite, change in bowel movements. Patient denies rashes, musculoskeletal complaints. No other specific complaints in a complete review of systems.      Objective:   Physical Exam  Well-developed well-nourished female in no acute distress. HEENT exam atraumatic, normocephalic, extraocular muscles are intact. Neck is supple. No jugular venous distention no thyromegaly. Chest clear to auscultation without increased work of breathing. Cardiac exam S1 and S2 are regular. Abdominal exam active bowel sounds, soft, nontender. Extremities no edema. Neurologic exam she is alert without any motor sensory deficits. Gait is normal.         Assessment & Plan:

## 2010-07-11 NOTE — Assessment & Plan Note (Signed)
Improved control. Continue current medications. She has started walking some. She has lost about 4 pounds since last visit. I've encouraged her to continue her exercise program and weight loss program.

## 2010-08-03 ENCOUNTER — Other Ambulatory Visit: Payer: Self-pay | Admitting: *Deleted

## 2010-08-03 MED ORDER — HYDROCHLOROTHIAZIDE 25 MG PO TABS: 25.0000 mg | ORAL_TABLET | Freq: Every day | ORAL | Status: DC

## 2010-08-09 ENCOUNTER — Other Ambulatory Visit: Payer: Self-pay | Admitting: *Deleted

## 2010-08-09 MED ORDER — INSULIN NPH ISOPHANE & REGULAR (70-30) 100 UNIT/ML ~~LOC~~ SUSP: 25.0000 [IU] | Freq: Two times a day (BID) | SUBCUTANEOUS | Status: DC

## 2010-08-10 ENCOUNTER — Other Ambulatory Visit: Payer: Self-pay | Admitting: *Deleted

## 2010-08-10 MED ORDER — "INSULIN SYRINGE-NEEDLE U-100 31G X 5/16"" 0.5 ML MISC": Status: DC

## 2010-11-15 ENCOUNTER — Other Ambulatory Visit (INDEPENDENT_AMBULATORY_CARE_PROVIDER_SITE_OTHER): Payer: BC Managed Care – PPO

## 2010-11-15 DIAGNOSIS — E119 Type 2 diabetes mellitus without complications: Secondary | ICD-10-CM

## 2010-11-15 LAB — HEMOGLOBIN A1C: Hgb A1c MFr Bld: 6.5 % (ref 4.6–6.5)

## 2010-11-22 ENCOUNTER — Ambulatory Visit: Payer: BC Managed Care – PPO | Admitting: Internal Medicine

## 2011-03-21 ENCOUNTER — Other Ambulatory Visit: Payer: Self-pay | Admitting: *Deleted

## 2011-03-21 MED ORDER — METFORMIN HCL 1000 MG PO TABS: 1000.0000 mg | ORAL_TABLET | Freq: Two times a day (BID) | ORAL | Status: DC

## 2011-05-07 ENCOUNTER — Other Ambulatory Visit: Payer: Self-pay | Admitting: Internal Medicine

## 2011-06-12 ENCOUNTER — Other Ambulatory Visit: Payer: Self-pay | Admitting: *Deleted

## 2011-06-12 MED ORDER — AMLODIPINE BESYLATE 10 MG PO TABS: 10.0000 mg | ORAL_TABLET | Freq: Every day | ORAL | Status: DC

## 2011-07-30 ENCOUNTER — Other Ambulatory Visit: Payer: Self-pay | Admitting: Internal Medicine

## 2011-08-21 ENCOUNTER — Encounter: Payer: Self-pay | Admitting: Internal Medicine

## 2011-08-21 ENCOUNTER — Ambulatory Visit (INDEPENDENT_AMBULATORY_CARE_PROVIDER_SITE_OTHER): Payer: BC Managed Care – PPO | Admitting: Internal Medicine

## 2011-08-21 VITALS — BP 110/70 | HR 72 | Temp 98.4°F | Resp 16 | Ht 66.0 in | Wt 226.0 lb

## 2011-08-21 DIAGNOSIS — F3289 Other specified depressive episodes: Secondary | ICD-10-CM

## 2011-08-21 DIAGNOSIS — I10 Essential (primary) hypertension: Secondary | ICD-10-CM

## 2011-08-21 DIAGNOSIS — E119 Type 2 diabetes mellitus without complications: Secondary | ICD-10-CM

## 2011-08-21 DIAGNOSIS — E785 Hyperlipidemia, unspecified: Secondary | ICD-10-CM

## 2011-08-21 DIAGNOSIS — F329 Major depressive disorder, single episode, unspecified: Secondary | ICD-10-CM

## 2011-08-21 LAB — BASIC METABOLIC PANEL
BUN: 13 mg/dL (ref 6–23)
Chloride: 101 mEq/L (ref 96–112)
Glucose, Bld: 93 mg/dL (ref 70–99)
Potassium: 3.9 mEq/L (ref 3.5–5.1)

## 2011-08-21 LAB — HEMOGLOBIN A1C: Hgb A1c MFr Bld: 6.8 % — ABNORMAL HIGH (ref 4.6–6.5)

## 2011-08-21 LAB — LIPID PANEL
Cholesterol: 162 mg/dL (ref 0–200)
LDL Cholesterol: 87 mg/dL (ref 0–99)

## 2011-08-21 LAB — HEPATIC FUNCTION PANEL
ALT: 28 U/L (ref 0–35)
AST: 25 U/L (ref 0–37)
Albumin: 3.4 g/dL — ABNORMAL LOW (ref 3.5–5.2)

## 2011-08-21 NOTE — Assessment & Plan Note (Signed)
soing well on meds

## 2011-08-21 NOTE — Progress Notes (Signed)
  Subjective:    Patient ID: Kristin Mathews, female    DOB: 1970-11-16, 41 y.o.   MRN: [id number]  HPI   patient comes in for followup of multiple medical problems including type 2 diabetes, hyperlipidemia, hypertension. The patient does not check blood sugar or blood pressure at home. The patetient does not follow an exercise or diet program. The patient denies any polyuria, polydipsia.  In the past the patient has gone to diabetic treatment center. The patient is tolerating medications  Without difficulty. The patient does admit to medication compliance.   Past Medical History  Diagnosis Date  . Diabetes mellitus     Type 2  . Hypertension   . Multiple sclerosis   . Endometriosis   . Depression    Past Surgical History  Procedure Date  . Lumbar disc surgery     LS spine  . Wisdom tooth extraction   . Exploratory laparotomy     reports that she has never smoked. She does not have any smokeless tobacco history on file. She reports that she drinks alcohol. She reports that she does not use illicit drugs. family history includes Cancer in her mother; Diabetes in her father and mother; and Other in her father. No Known Allergies   Review of Systems  patient denies chest pain, shortness of breath, orthopnea. Denies lower extremity edema, abdominal pain, change in appetite, change in bowel movements. Patient denies rashes, musculoskeletal complaints. No other specific complaints in a complete review of systems.      Objective:   Physical Exam  Well-developed well-nourished female in no acute distress. HEENT exam atraumatic, normocephalic, extraocular muscles are intact. Neck is supple. No jugular venous distention no thyromegaly. Chest clear to auscultation without increased work of breathing. Cardiac exam S1 and S2 are regular. Abdominal exam active bowel sounds, soft, nontender. Extremities no edema. Neurologic exam she is alert without any motor sensory deficits. Gait is  normal.        Assessment & Plan:

## 2011-08-21 NOTE — Assessment & Plan Note (Signed)
BP Readings from Last 3 Encounters:  08/21/11 110/70  07/11/10 114/80  03/07/10 122/84   Well controlled- continue same meds

## 2011-08-21 NOTE — Assessment & Plan Note (Signed)
Needs labs at least every 6 months Scheduled for today and 6 months  Reviewed HM for DM

## 2011-09-28 ENCOUNTER — Other Ambulatory Visit: Payer: Self-pay | Admitting: Internal Medicine

## 2011-11-01 ENCOUNTER — Other Ambulatory Visit: Payer: Self-pay | Admitting: *Deleted

## 2011-11-01 MED ORDER — HYDROCHLOROTHIAZIDE 25 MG PO TABS: 25.0000 mg | ORAL_TABLET | Freq: Every day | ORAL | Status: DC

## 2011-12-19 ENCOUNTER — Other Ambulatory Visit: Payer: Self-pay | Admitting: *Deleted

## 2011-12-19 MED ORDER — AMLODIPINE BESYLATE 10 MG PO TABS: 10.0000 mg | ORAL_TABLET | Freq: Every day | ORAL | Status: DC

## 2011-12-21 ENCOUNTER — Other Ambulatory Visit: Payer: Self-pay | Admitting: *Deleted

## 2011-12-21 MED ORDER — AMLODIPINE BESYLATE 10 MG PO TABS: 10.0000 mg | ORAL_TABLET | Freq: Every day | ORAL | Status: DC

## 2012-01-15 ENCOUNTER — Other Ambulatory Visit: Payer: Self-pay | Admitting: *Deleted

## 2012-01-15 MED ORDER — METFORMIN HCL 1000 MG PO TABS: 1000.0000 mg | ORAL_TABLET | Freq: Two times a day (BID) | ORAL | Status: DC

## 2012-01-18 ENCOUNTER — Other Ambulatory Visit: Payer: Self-pay | Admitting: *Deleted

## 2012-01-18 MED ORDER — AMLODIPINE BESY-BENAZEPRIL HCL 10-20 MG PO CAPS: 1.0000 | ORAL_CAPSULE | Freq: Every day | ORAL | Status: DC

## 2012-02-08 ENCOUNTER — Other Ambulatory Visit: Payer: Self-pay | Admitting: Internal Medicine

## 2012-02-08 MED ORDER — DULOXETINE HCL 60 MG PO CPEP: 60.0000 mg | ORAL_CAPSULE | Freq: Every day | ORAL | Status: DC

## 2012-02-08 MED ORDER — AMLODIPINE BESY-BENAZEPRIL HCL 10-20 MG PO CAPS: 1.0000 | ORAL_CAPSULE | Freq: Every day | ORAL | Status: DC

## 2012-02-08 MED ORDER — HYDROCHLOROTHIAZIDE 25 MG PO TABS: 25.0000 mg | ORAL_TABLET | Freq: Every day | ORAL | Status: DC

## 2012-02-08 MED ORDER — METFORMIN HCL 1000 MG PO TABS: 1000.0000 mg | ORAL_TABLET | Freq: Two times a day (BID) | ORAL | Status: DC

## 2012-02-08 NOTE — Telephone Encounter (Signed)
Pt needs to transfer scripts to new company. [facility]. They require new [place].ript[facility] for first order.. Pt needs  hydrochlorothiazide (HYDRODIURIL) 25 MG tablet. (90 day supply) now Pt is almost out of med. Please change patients pharm info to [facility] from now on. (at least this year) Pt other scripts needed next month are: DULoxetine (CYMBALTA) 60 MG capsule metFORMIN (GLUCOPHAGE) 1000 MG tablet amLODipine-benazepril (LOTREL) 10-20 MG per capsule

## 2012-02-08 NOTE — Telephone Encounter (Signed)
[facility]  Rx's sent in electronically

## 2012-04-29 ENCOUNTER — Telehealth: Payer: Self-pay | Admitting: Internal Medicine

## 2012-04-29 NOTE — Telephone Encounter (Signed)
PT requesting to have her thyroid levels checked. Please add order, if necessary. I'll be calling her soon to schedule additional labs that orders are in for. Thank you!

## 2012-04-29 NOTE — Telephone Encounter (Signed)
Per Dr Cato Mulligan last office note pt only needs lab appt only for cpx labs and hgb a1c.  Please schedule

## 2012-05-01 NOTE — Telephone Encounter (Signed)
[id number]

## 2012-05-04 ENCOUNTER — Other Ambulatory Visit: Payer: Self-pay | Admitting: Internal Medicine

## 2012-05-08 NOTE — Telephone Encounter (Signed)
Patient Information:  Caller Name: Dustin  Phone: [phone]  Patient: Kristin Mathews, Kristin Mathews  Gender: Female  DOB: [birth year]  Age: 42 Years  PCP: [name] (Adults only)  Pregnant: No  Office Follow Up:  Does the office need to follow up with this patient?: Yes  Instructions For The Office: Patient declined triage; she wants to get thyroid tests done with her other labs.  She asks for lab appointment only. Thank you.   Symptoms  Reason For Call & Symptoms: Patient states she requests to have additional tests for her yearly exam; she is currently on birth control and requests to have Free T4 added to the labs to be drawn as she has read that it can be influenced by the use of oral contraceptives which she is on continuously.  States, "I hope there is something wrong with my thyroid that explains why I feel so lousy."  Extreme fatigue, drowsiness/ sleeping; itching skin on calves, and  extreme appetite.  Relates she has to come in for labs every few months and knows A1C will be out of range due to flare up of MS and steroid therapy.  She declined triage; states she only wants to get the thyroid tests completed with her other labs.  Note to office per No Guideline protocol.  Reviewed Health History In EMR: Yes  Reviewed Medications In EMR: Yes  Reviewed Allergies In EMR: Yes  Reviewed Surgeries / Procedures: Yes  Date of Onset of Symptoms: Unknown OB / GYN:  LMP: Unknown  Guideline(s) Used:  No Protocol Available - Sick Adult  Disposition Per Guideline:   See Within 2 Weeks in Office  Reason For Disposition Reached:   Nursing judgment  Advice Given:  Call Back If:  New symptoms develop  You become worse.  Patient Refused Recommendation:  Patient Refused Care Advice  Patient declined triage; she wants to get thyroid tests done with her other labs.  She asks for lab appointment only. Thank you.

## 2012-05-09 NOTE — Telephone Encounter (Signed)
I scheduled lab appt for 05/12/12.  Is it ok to add the T4?

## 2012-05-10 NOTE — Telephone Encounter (Signed)
yes

## 2012-05-12 NOTE — Telephone Encounter (Signed)
Lab added

## 2012-05-13 ENCOUNTER — Other Ambulatory Visit (INDEPENDENT_AMBULATORY_CARE_PROVIDER_SITE_OTHER): Payer: BC Managed Care – PPO

## 2012-05-13 DIAGNOSIS — Z0000 Encounter for general adult medical examination without abnormal findings: Secondary | ICD-10-CM

## 2012-05-13 LAB — CBC WITH DIFFERENTIAL/PLATELET
Basophils Relative: 0.5 % (ref 0.0–3.0)
Eosinophils Relative: 2.8 % (ref 0.0–5.0)
HCT: 41.6 % (ref 36.0–46.0)
Hemoglobin: 14.6 g/dL (ref 12.0–15.0)
Lymphs Abs: 2.5 10*3/uL (ref 0.7–4.0)
Monocytes Relative: 4.8 % (ref 3.0–12.0)
Platelets: 417 10*3/uL — ABNORMAL HIGH (ref 150.0–400.0)
RBC: 4.65 Mil/uL (ref 3.87–5.11)
WBC: 10.1 10*3/uL (ref 4.5–10.5)

## 2012-05-13 LAB — LIPID PANEL: Cholesterol: 164 mg/dL (ref 0–200)

## 2012-05-13 LAB — HEPATIC FUNCTION PANEL
ALT: 25 U/L (ref 0–35)
AST: 24 U/L (ref 0–37)
Bilirubin, Direct: 0.1 mg/dL (ref 0.0–0.3)
Total Bilirubin: 0.7 mg/dL (ref 0.3–1.2)

## 2012-05-13 LAB — POCT URINALYSIS DIPSTICK
Bilirubin, UA: NEGATIVE
Ketones, UA: NEGATIVE
Leukocytes, UA: NEGATIVE

## 2012-05-13 LAB — BASIC METABOLIC PANEL
BUN: 13 mg/dL (ref 6–23)
GFR: 77.66 mL/min (ref >60.00)
Potassium: 4.1 mEq/L (ref 3.5–5.1)
Sodium: 141 mEq/L (ref 135–145)

## 2012-05-13 LAB — MICROALBUMIN / CREATININE URINE RATIO
Microalb Creat Ratio: 2.9 mg/g (ref 0.0–30.0)
Microalb, Ur: 3.5 mg/dL — ABNORMAL HIGH (ref 0.0–1.9)

## 2012-05-13 LAB — HEMOGLOBIN A1C: Hgb A1c MFr Bld: 7.3 % — ABNORMAL HIGH (ref 4.6–6.5)

## 2012-07-14 ENCOUNTER — Telehealth: Payer: Self-pay | Admitting: Internal Medicine

## 2012-07-14 MED ORDER — HYDROCHLOROTHIAZIDE 25 MG PO TABS: 25.0000 mg | ORAL_TABLET | Freq: Every day | ORAL | Status: DC

## 2012-07-14 MED ORDER — DULOXETINE HCL 60 MG PO CPEP: 60.0000 mg | ORAL_CAPSULE | Freq: Every day | ORAL | Status: DC

## 2012-07-14 MED ORDER — AMLODIPINE BESY-BENAZEPRIL HCL 10-20 MG PO CAPS: 1.0000 | ORAL_CAPSULE | Freq: Every day | ORAL | Status: DC

## 2012-07-14 NOTE — Telephone Encounter (Signed)
3 mth supply sent in to [facility] but pt will need an appt before she runs out.  Dr Cato Mulligan seen pt last August 2013

## 2012-07-14 NOTE — Telephone Encounter (Signed)
Called pt adn LMOM that she needs an appt with Dr. Cato Mulligan within 90 days.

## 2012-07-14 NOTE — Telephone Encounter (Signed)
Pt called [facility] to get refills. They told her that her rx were expired - at least 3 of them. They also told her that she had to call us and request that we send in all new rx to [facility]. She needs new rx on:  amLODipine-benazepril (LOTREL) 10-20 MG per capsule DULoxetine (CYMBALTA) 60 MG capsule hydrochlorothiazide (HYDRODIURIL) 25 MG tablet

## 2012-11-04 ENCOUNTER — Telehealth: Payer: Self-pay | Admitting: Internal Medicine

## 2012-11-04 NOTE — Telephone Encounter (Signed)
Ok to see [employer]

## 2012-11-04 NOTE — Telephone Encounter (Signed)
Pt needs cpe before end of Nov. Is it OK for pt to see padonda?

## 2012-11-06 ENCOUNTER — Other Ambulatory Visit: Payer: Self-pay | Admitting: Internal Medicine

## 2012-11-12 ENCOUNTER — Other Ambulatory Visit (INDEPENDENT_AMBULATORY_CARE_PROVIDER_SITE_OTHER): Payer: BC Managed Care – PPO

## 2012-11-12 DIAGNOSIS — Z0000 Encounter for general adult medical examination without abnormal findings: Secondary | ICD-10-CM

## 2012-11-12 LAB — BASIC METABOLIC PANEL
BUN: 16 mg/dL (ref 6–23)
CO2: 29 mEq/L (ref 19–32)
Calcium: 8.9 mg/dL (ref 8.4–10.5)
Creatinine, Ser: 0.9 mg/dL (ref 0.4–1.2)
Glucose, Bld: 150 mg/dL — ABNORMAL HIGH (ref 70–99)

## 2012-11-12 LAB — CBC WITH DIFFERENTIAL/PLATELET
Basophils Absolute: 0.1 10*3/uL (ref 0.0–0.1)
Eosinophils Absolute: 0.3 10*3/uL (ref 0.0–0.7)
Hemoglobin: 14.2 g/dL (ref 12.0–15.0)
Lymphocytes Relative: 40.9 % (ref 12.0–46.0)
MCHC: 33.6 g/dL (ref 30.0–36.0)
Neutro Abs: 3.5 10*3/uL (ref 1.4–7.7)
Platelets: 379 10*3/uL (ref 150.0–400.0)
RDW: 13 % (ref 11.5–14.6)

## 2012-11-12 LAB — HEPATIC FUNCTION PANEL
Albumin: 3.4 g/dL — ABNORMAL LOW (ref 3.5–5.2)
Alkaline Phosphatase: 73 U/L (ref 39–117)

## 2012-11-12 LAB — LIPID PANEL: Cholesterol: 178 mg/dL (ref 0–200)

## 2012-11-12 LAB — HEMOGLOBIN A1C: Hgb A1c MFr Bld: 8 % — ABNORMAL HIGH (ref 4.6–6.5)

## 2012-11-13 LAB — POCT URINALYSIS DIPSTICK
Ketones, UA: NEGATIVE
Leukocytes, UA: NEGATIVE
Protein, UA: NEGATIVE
Urobilinogen, UA: 0.2

## 2012-11-13 LAB — MICROALBUMIN / CREATININE URINE RATIO: Microalb, Ur: 1.8 mg/dL (ref 0.0–1.9)

## 2012-11-14 ENCOUNTER — Other Ambulatory Visit: Payer: Self-pay | Admitting: Internal Medicine

## 2012-11-14 DIAGNOSIS — E119 Type 2 diabetes mellitus without complications: Secondary | ICD-10-CM

## 2012-11-24 ENCOUNTER — Encounter: Payer: Self-pay | Admitting: Internal Medicine

## 2012-11-24 ENCOUNTER — Ambulatory Visit (INDEPENDENT_AMBULATORY_CARE_PROVIDER_SITE_OTHER): Payer: BC Managed Care – PPO | Admitting: Internal Medicine

## 2012-11-24 VITALS — BP 132/88 | HR 100 | Temp 98.1°F | Resp 10 | Ht 66.0 in | Wt 230.9 lb

## 2012-11-24 DIAGNOSIS — E119 Type 2 diabetes mellitus without complications: Secondary | ICD-10-CM

## 2012-11-24 NOTE — Patient Instructions (Addendum)
Please stop insulin 70/30. Start Lantus 25 units at night. Increase metformin to 2000 mg with dinner >> Change to extended release Metformin.  Start Victoza at 0.6 mg daily and increase to 1.2 mg in 1 week  - inject in abdomen once a day in am. Please return in 1 month with your sugar log.   PATIENT INSTRUCTIONS FOR TYPE 2 DIABETES:  **Please join MyChart!** - see attached instructions about how to join   DIET AND EXERCISE Diet and exercise is an important part of diabetic treatment.  We recommended aerobic exercise in the form of brisk walking (working between 40-60% of maximal aerobic capacity, similar to brisk walking) for 150 minutes per week (such as 30 minutes five days per week) along with 3 times per week performing 'resistance' training (using various gauge rubber tubes with handles) 5-10 exercises involving the major muscle groups (upper body, lower body and core) performing 10-15 repetitions (or near fatigue) each exercise. Start at half the above goal but build slowly to reach the above goals. If limited by weight, joint pain, or disability, we recommend daily walking in a swimming pool with water up to waist to reduce pressure from joints while allow for adequate exercise.    BLOOD GLUCOSES Monitoring your blood glucoses is important for continued management of your diabetes. Please check your blood glucoses 2-4 times a day: fasting, before meals and at bedtime (you can rotate these measurements - e.g. one day check before the 3 meals, the next day check before 2 of the meals and before bedtime, etc.   HYPOGLYCEMIA (low blood sugar) Hypoglycemia is usually a reaction to not eating, exercising, or taking too much insulin/ other diabetes drugs.  Symptoms include tremors, sweating, hunger, confusion, headache, etc. Treat IMMEDIATELY with 15 grams of Carbs:   4 glucose tablets    cup regular juice/soda   2 tablespoons raisins   4 teaspoons sugar   1 tablespoon honey Recheck blood  glucose in 15 mins and repeat above if still symptomatic/blood glucose <100. Please contact our office at [phone] if you have questions about how to next handle your insulin.  RECOMMENDATIONS TO REDUCE YOUR RISK OF DIABETIC COMPLICATIONS: * Take your prescribed MEDICATION(S). * Follow a DIABETIC diet: Complex carbs, fiber rich foods, heart healthy fish twice weekly, (monounsaturated and polyunsaturated) fats * AVOID saturated/trans fats, high fat foods, >2,300 mg salt per day. * EXERCISE at least 5 times a week for 30 minutes or preferably daily.  * DO NOT SMOKE OR DRINK more than 1 drink a day. * Check your FEET every day. Do not wear tightfitting shoes. Contact us if you develop an ulcer * See your EYE doctor once a year or more if needed * Get a FLU shot once a year * Get a PNEUMONIA vaccine once before and once after age 36 years  GOALS:  * Your Hemoglobin A1c of <7%  * fasting sugars need to be <130 * after meals sugars need to be <180 (2h after you start eating) * Your Systolic BP should be 140 or lower  * Your Diastolic BP should be 80 or lower  * Your HDL (Good Cholesterol) should be 40 or higher  * Your LDL (Bad Cholesterol) should be 100 or lower  * Your Triglycerides should be 150 or lower  * Your Urine microalbumin (kidney function) should be <30 * Your Body Mass Index should be 25 or lower   We will be glad to help you achieve these goals.  Our telephone number is: [phone].

## 2012-11-24 NOTE — Progress Notes (Signed)
Patient ID: Kristin Mathews, female   DOB: 1970/05/30, 42 y.o.   MRN: [id number]  HPI: Kristin Mathews is a 42 y.o.-year-old female, referred by her PCP, Dr. Cato Mulligan, for management of DM2, insulin-dependent, uncontrolled, with complications.  Patient has been diagnosed with diabetes in 2009; she started insulin 2009 while on steroids for MS. Last hemoglobin A1c was: Lab Results  Component Value Date   HGBA1C 8.0* 11/12/2012   HGBA1C 7.3* 05/13/2012   HGBA1C 6.8* 08/21/2011   Pt is on a regimen of: - Metformin 1000 mg po at night >> cannot take am dose b/c diarrhea - Humulin 70/30 25 units bid She was on Lantus before.  Januvia - did not help.   Pt checks her sugars seldom only if feels poorly.   Has lows once a mo.. Lowest sugar was 50s; she has hypoglycemia awareness at 50s.  Highest sugar was during an MS exacerebation in 02/2012 (iv solumedrol) >> HI. Off solumedrol, mid 200s.  Pt's meals are: - Breakfast: yoghurt + granola, cereals - Lunch: sandwich, sushi, [place] - Dinner: home cooked meal: stews, etc. - Snacks: 3-4  - no CKD, last BUN/creatinine:  Lab Results  Component Value Date   BUN 16 11/12/2012   CREATININE 0.9 11/12/2012  On benazepril.  - last set of lipids: Lab Results  Component Value Date   CHOL 178 11/12/2012   HDL 42.80 11/12/2012   LDLCALC 105* 11/12/2012   LDLDIRECT 112.1 10/13/2009   TRIG 153.0* 11/12/2012   CHOLHDL 4 11/12/2012  Not on a statin.  - last eye exam was last year.  No DR.  - mild numbness and tingling in her feet.  I reviewed her chart and she also has a history of MS (neuro Dr Despina Arias) - last 2 atacks: 02/2012, 2010; also HTN, borderline HL, fatty liver, endometriosis.  Pt has FH of DM in mother, father, GM.   ROS: Constitutional: no weight gain/loss, + fatigue, + subjective hyperthermia Eyes: + blurry vision, no xerophthalmia ENT: no sore throat, no nodules palpated in throat, no dysphagia/odynophagia, no  hoarseness Cardiovascular: no CP/SOB/palpitations/leg swelling Respiratory: no cough/SOB Gastrointestinal: no N/V/+ D/no C Musculoskeletal: + both  muscle/joint aches Skin: no rashes, + itching Neurological: no tremors/numbness/tingling/dizziness  Past Medical History  Diagnosis Date  . Diabetes mellitus     Type 2  . Hypertension   . Multiple sclerosis   . Endometriosis   . Depression    Past Surgical History  Procedure Laterality Date  . Lumbar disc surgery      LS spine  . Wisdom tooth extraction    . Exploratory laparotomy     History   Social History  . Marital Status: Single    Spouse Name: N/A    Number of Children: 0   Social History Main Topics  . Smoking status: Never Smoker   . Smokeless tobacco: Not on file  . Alcohol Use: Yes  . Drug Use: No  . 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 amLODipine-benazepril (LOTREL) 10-20 MG per capsule Take 1 capsule by mouth daily.  90 capsule  0  . cetirizine (ZYRTEC) 10 MG tablet Take 10 mg by mouth daily.      . DULoxetine (CYMBALTA) 60 MG capsule Take 1  capsule (60 mg total) by mouth at bedtime.  90 capsule  0  . HUMULIN 70/30 (70-30) 100 UNIT/ML injection INJECT 25 UNITS INTO THE SKIN 2 TIMES DAILY WITH BREAKFAST AND DINNER  20 mL  5  . hydrochlorothiazide (HYDRODIURIL) 25 MG tablet TAKE 1 TABLET DAILY  90 tablet  1  . Insulin Syringe-Needle U-100 (BD INSULIN SYRINGE ULTRAFINE) 31G X 5/16" 0.5 ML MISC Use twice daily as directed  100 each  11  . interferon beta-1a (REBIF) 44 MCG/0.5ML injection Inject 22 mcg into the skin 3 (three) times a week. M-W-F as directed 1/2 at present      . levonorgestrel-ethinyl estradiol (LYBREL) 90-20 MCG per tablet Take 1 tablet by mouth daily.        . metFORMIN  (GLUCOPHAGE) 1000 MG tablet Take 1 tablet (1,000 mg total) by mouth 2 (two) times daily with a meal.  180 tablet  1  . modafinil (PROVIGIL) 200 MG tablet Take 200 mg by mouth as needed.         No current facility-administered medications on file prior to visit.   No Known Allergies Family History  Problem Relation Age of Onset  . Cancer Mother     Breast  . Diabetes Mother   . Diabetes Father   . Other Father     progressive supranuclear palsy   PE: BP 132/88  Pulse 100  Temp(Src) 98.1 F (36.7 C) (Oral)  Resp 10  Ht 5\' 6"  (1.676 m)  Wt 230 lb 14.4 oz (104.736 kg)  BMI 37.29 kg/m2  SpO2 95% Wt Readings from Last 3 Encounters:  11/24/12 230 lb 14.4 oz (104.736 kg)  08/21/11 226 lb (102.513 kg)  07/11/10 222 lb (100.699 kg)   Constitutional: overweight, in NAD Eyes: PERRLA, EOMI, no exophthalmos ENT: moist mucous membranes, no thyromegaly, no cervical lymphadenopathy Cardiovascular: RRR, No MRG Respiratory: CTA B Gastrointestinal: abdomen soft, NT, ND, BS+ Musculoskeletal: no deformities, strength intact in all 4 Skin: moist, warm, no rashes Neurological: no tremor with outstretched hands  ASSESSMENT: 1. DM2, non-insulin-dependent, uncontrolled, without complications  PLAN:  1. Patient with a 5 year h/o DM2 (excarbated by steroid use for MS), recently more uncontrolled, on a regimen of Premixed insulin + metformin, which became insufficient. She is wondering whether she can come off insulin, and it may be possible, but it is difficult to know without sugar checks and while on 50 units of insulin a day >> we need her to check sugars and slowly titrate insulin down.  - We discussed about options for treatment, and I suggested to:  Please stop insulin 70/30. Start Lantus 25 units at night. Increase metformin to 2000 mg with dinner >> Change to extended release Metformin.  Start Victoza at 0.6 mg daily and increase to 1.2 mg in 1 week  - inject in abdomen once a day in  am. Please return in 1 month with your sugar log.  - given samples of Glumetza, Victoza and Levemir (we did not have Lantus). - I advised her to call me in 2 weeks to let me know how she is doing. She will need Rxs for all meds at that time, if the regimen is working well.  - will need mealtime insulin if starts solumedrol again - discussed about lower glycemic index foods (esp for b'fast) - start checking sugars at different times of the day - check 2 times a day, rotating checks - given sugar log and advised how to fill it and  to bring it at next appt  - given foot care handout and explained the principles  - given instructions for hypoglycemia management "15-15 rule"  - advised for yearly eye exams >> she is up to date - had the flu vaccine this season - Return to clinic in 1 mo with sugar log

## 2012-11-26 ENCOUNTER — Encounter: Payer: BC Managed Care – PPO | Admitting: Family

## 2012-11-27 ENCOUNTER — Encounter: Payer: Self-pay | Admitting: Family

## 2012-11-27 ENCOUNTER — Ambulatory Visit (INDEPENDENT_AMBULATORY_CARE_PROVIDER_SITE_OTHER): Payer: BC Managed Care – PPO | Admitting: Family

## 2012-11-27 VITALS — BP 138/90 | HR 82 | Ht 66.5 in | Wt 232.0 lb

## 2012-11-27 DIAGNOSIS — E7800 Pure hypercholesterolemia, unspecified: Secondary | ICD-10-CM | POA: Insufficient documentation

## 2012-11-27 DIAGNOSIS — E1159 Type 2 diabetes mellitus with other circulatory complications: Secondary | ICD-10-CM

## 2012-11-27 DIAGNOSIS — IMO0002 Reserved for concepts with insufficient information to code with codable children: Secondary | ICD-10-CM | POA: Insufficient documentation

## 2012-11-27 DIAGNOSIS — E1151 Type 2 diabetes mellitus with diabetic peripheral angiopathy without gangrene: Secondary | ICD-10-CM | POA: Insufficient documentation

## 2012-11-27 DIAGNOSIS — E119 Type 2 diabetes mellitus without complications: Secondary | ICD-10-CM | POA: Insufficient documentation

## 2012-11-27 DIAGNOSIS — Z0000 Encounter for general adult medical examination without abnormal findings: Secondary | ICD-10-CM

## 2012-11-27 HISTORY — DX: Type 2 diabetes mellitus with other circulatory complications: E11.59

## 2012-11-27 MED ORDER — SIMVASTATIN 10 MG PO TABS: 10.0000 mg | ORAL_TABLET | Freq: Every day | ORAL | Status: DC

## 2012-11-27 NOTE — Progress Notes (Signed)
Pre-visit discussion using our clinic review tool. No additional management support is needed unless otherwise documented below in the visit note.  

## 2012-11-27 NOTE — Patient Instructions (Signed)

## 2012-11-27 NOTE — Progress Notes (Signed)
Subjective:    Patient ID: Kristin Mathews, female    DOB: 1970/12/18, 42 y.o.   MRN: [id number]  HPI 42 year old WF, nonsmoker, patient of Dr. Cato Mulligan, is in for a routine physical examination for this healthy  Female. Reviewed all health maintenance protocols including mammography colonoscopy bone density and reviewed appropriate screening labs. Her immunization history was reviewed as well as her current medications and allergies refills of her chronic medications were given and the plan for yearly health maintenance was discussed all orders and referrals were made as appropriate.   Review of Systems  Constitutional: Negative.   HENT: Negative.   Eyes: Negative.   Respiratory: Negative.   Cardiovascular: Negative.   Gastrointestinal: Negative.   Endocrine: Negative.   Genitourinary: Negative.   Musculoskeletal: Negative.   Skin: Negative.   Allergic/Immunologic: Negative.   Neurological: Negative.   Hematological: Negative.   Psychiatric/Behavioral: Negative.    Past Medical History  Diagnosis Date  . Diabetes mellitus     Type 2  . Hypertension   . Multiple sclerosis   . Endometriosis   . Depression     History   Social History  . Marital Status: Single    Spouse Name: N/A    Number of Children: N/A  . Years of Education: N/A   Occupational History  . Not on file.   Social History Main Topics  . Smoking status: Never Smoker   . Smokeless tobacco: Not on file  . Alcohol Use: Yes  . Drug Use: No  . Sexual Activity: Not Currently   Other Topics Concern  . 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 Lumbar disc surgery      LS spine  . Wisdom tooth extraction    . Exploratory laparotomy      Family History  Problem Relation Age of Onset  . Cancer Mother     Breast  . Diabetes Mother   .  Diabetes Father   . Other Father     progressive supranuclear palsy    No Known Allergies  Current Outpatient Prescriptions on File Prior to Visit  Medication Sig Dispense Refill  . amLODipine-benazepril (LOTREL) 10-20 MG per capsule Take 1 capsule by mouth daily.  90 capsule  0  . cetirizine (ZYRTEC) 10 MG tablet Take 10 mg by mouth daily.      . DULoxetine (CYMBALTA) 60 MG capsule Take 1 capsule (60 mg total) by mouth at bedtime.  90 capsule  0  . HUMULIN 70/30 (70-30) 100 UNIT/ML injection INJECT 25 UNITS INTO THE SKIN 2 TIMES DAILY WITH BREAKFAST AND DINNER  20 mL  5  . hydrochlorothiazide (HYDRODIURIL) 25 MG tablet TAKE 1 TABLET DAILY  90 tablet  1  . Insulin Syringe-Needle U-100 (BD INSULIN SYRINGE ULTRAFINE) 31G X 5/16" 0.5 ML MISC Use twice daily as directed  100 each  11  . interferon beta-1a (REBIF) 44 MCG/0.5ML injection Inject 22 mcg into the skin 3 (three) times a week. M-W-F as directed 1/2 at present      . levonorgestrel-ethinyl estradiol (LYBREL) 90-20 MCG per tablet Take 1 tablet by mouth daily.        . metFORMIN (GLUCOPHAGE) 1000 MG tablet Take 1 tablet (1,000 mg total) by mouth 2 (two) times daily with a meal.  180 tablet  1  . modafinil (PROVIGIL) 200 MG tablet Take 200 mg by mouth as needed.         No current facility-administered medications on file prior to visit.    BP 138/90  Pulse 82  Ht 5' 6.5" (1.689 m)  Wt 232 lb (105.235 kg)  BMI 36.89 kg/m2chart     Objective:   Physical Exam  Constitutional: She appears well-developed and well-nourished.  HENT:  Right Ear: External ear normal.  Left Ear: External ear normal.  Nose: Nose normal.  Mouth/Throat: Oropharynx is clear and moist.  Eyes: Conjunctivae are normal. Pupils are equal, round, and reactive to light.  Neck: Normal range of motion. Neck supple.  Cardiovascular: Normal rate, regular rhythm and normal heart sounds.   Pulmonary/Chest: Effort normal and breath sounds normal.  Abdominal: Soft.  Bowel sounds are normal.  Musculoskeletal: Normal range of motion.  Neurological: She is alert. She has normal reflexes. She displays normal reflexes. No cranial nerve deficit. Coordination normal.  Skin: Skin is warm and dry.  Psychiatric: She has a normal mood and affect.          Assessment & Plan:  Assessment:  1. CPX 2. type 2 diabetes 3. Hyperlipidemia 4. Depression  Plan: Start simvastatin 10 mg once daily. Return in 6 weeks for recheck cholesterol. Continue seeing endocrinology. Encouraged healthy diet, exercise, weight reduction. Follow up in 6 weeks and sooner as needed.  Patient to schedule her mammogram follow up with gynecology as needed.Marland Kitchen

## 2012-12-01 ENCOUNTER — Telehealth: Payer: Self-pay

## 2012-12-01 NOTE — Telephone Encounter (Signed)
Lvm for pt to return call.

## 2012-12-01 NOTE — Telephone Encounter (Signed)
Carollee Herter, Can you find out what her sugars are? And on what doses of meds? Thank you.

## 2012-12-01 NOTE — Telephone Encounter (Signed)
The patient called and is hoping to get a call back from the nurse regarding her medication.  She states the new prescriptions are not working. Callback - [phone]  Thanks!

## 2012-12-02 ENCOUNTER — Other Ambulatory Visit: Payer: Self-pay | Admitting: Internal Medicine

## 2012-12-02 MED ORDER — GLUCOSE BLOOD VI STRP: ORAL_STRIP | Status: DC

## 2012-12-02 NOTE — Telephone Encounter (Signed)
Called and discussed with the patient about her sugars.  - am: 150-175 - lunch time: Last week 211-235, this week 135-154, however she mentions that she was afraid to eat much this week and she believes that is why they are better. - Dinner time: 208-228.  She just switched to the higher dose of Victoza, 1.2 milligrams, yesterday.  Advised to: - Continue Glumetza 2000 mg daily - Continue Levemir 25 units at night - Continue Victoza 1.2 mg x 5-7 days, and then increase to 1.8 mg daily - coronary back in a week if sugars still higher than 180

## 2012-12-02 NOTE — Telephone Encounter (Signed)
Pt returned call, lvm stating that she is on 2000 mg of metformin/glumetza, victoza as directed and 25 units of levemir. Last night her bg was 290 and she did 30 units of levemir.  Fasting bg has been between 140-170 with an average of 155. During the day avg 250.  Pt states she feels bad. She is concerned that her bg has been too high for too long. [phone]). Please advise

## 2012-12-05 ENCOUNTER — Other Ambulatory Visit: Payer: Self-pay | Admitting: *Deleted

## 2012-12-05 ENCOUNTER — Telehealth: Payer: Self-pay | Admitting: *Deleted

## 2012-12-05 MED ORDER — INSULIN DETEMIR 100 UNIT/ML FLEXPEN: PEN_INJECTOR | SUBCUTANEOUS | Status: DC

## 2012-12-05 MED ORDER — LIRAGLUTIDE 18 MG/3ML ~~LOC~~ SOPN: PEN_INJECTOR | SUBCUTANEOUS | Status: DC

## 2012-12-05 MED ORDER — GLUMETZA 1000 MG PO TB24: 1000.0000 mg | ORAL_TABLET | Freq: Two times a day (BID) | ORAL | Status: DC

## 2012-12-05 NOTE — Telephone Encounter (Signed)
Great! OK to refill all - please send the Rx DAW  for Glumetza.

## 2012-12-05 NOTE — Telephone Encounter (Signed)
Pt called stating that she is doing well on the medications that Dr Elvera Lennox. Pt stated she would like an rx sent to her pharmacy, [facility] on [address]. Glumetza, Levemir and Victoza. Please advise.

## 2012-12-17 ENCOUNTER — Emergency Department (HOSPITAL_COMMUNITY)
Admission: EM | Admit: 2012-12-17 | Discharge: 2012-12-17 | Disposition: A | Discharge status: Home / Self Care | Payer: BC Managed Care – PPO | Attending: Emergency Medicine | Admitting: Emergency Medicine

## 2012-12-17 ENCOUNTER — Encounter (HOSPITAL_COMMUNITY): Payer: Self-pay | Admitting: Emergency Medicine

## 2012-12-17 DIAGNOSIS — F3289 Other specified depressive episodes: Secondary | ICD-10-CM | POA: Insufficient documentation

## 2012-12-17 DIAGNOSIS — E119 Type 2 diabetes mellitus without complications: Secondary | ICD-10-CM | POA: Insufficient documentation

## 2012-12-17 DIAGNOSIS — Z79899 Other long term (current) drug therapy: Secondary | ICD-10-CM | POA: Insufficient documentation

## 2012-12-17 DIAGNOSIS — Z8742 Personal history of other diseases of the female genital tract: Secondary | ICD-10-CM | POA: Insufficient documentation

## 2012-12-17 DIAGNOSIS — F329 Major depressive disorder, single episode, unspecified: Secondary | ICD-10-CM | POA: Insufficient documentation

## 2012-12-17 DIAGNOSIS — Z794 Long term (current) use of insulin: Secondary | ICD-10-CM | POA: Insufficient documentation

## 2012-12-17 DIAGNOSIS — I10 Essential (primary) hypertension: Secondary | ICD-10-CM | POA: Insufficient documentation

## 2012-12-17 DIAGNOSIS — T783XXA Angioneurotic edema, initial encounter: Secondary | ICD-10-CM | POA: Insufficient documentation

## 2012-12-17 DIAGNOSIS — G35 Multiple sclerosis: Secondary | ICD-10-CM | POA: Insufficient documentation

## 2012-12-17 MED ORDER — FAMOTIDINE 20 MG PO TABS
20.0000 mg | ORAL_TABLET | Freq: Once | ORAL | Status: AC
  Administered 2012-12-17: 20 mg via ORAL
  Filled 2012-12-17: qty 1

## 2012-12-17 MED ORDER — AMLODIPINE BESYLATE 10 MG PO TABS: 10.0000 mg | ORAL_TABLET | Freq: Every day | ORAL | Status: DC

## 2012-12-17 MED ORDER — PREDNISONE 20 MG PO TABS
60.0000 mg | ORAL_TABLET | Freq: Once | ORAL | Status: AC
  Administered 2012-12-17: 60 mg via ORAL
  Filled 2012-12-17: qty 3

## 2012-12-17 NOTE — ED Provider Notes (Signed)
Medical screening examination/treatment/procedure(s) were performed by non-physician practitioner and as supervising physician I was immediately available for consultation/collaboration.    Dayron Odland M Dakari Cregger, MD 12/17/12 0547 

## 2012-12-17 NOTE — ED Notes (Signed)
Pt started with lip swelling at 8pm; worse thru the night; presents with large amount upper lip swelling; denies any difficulty breathing; no itching

## 2012-12-17 NOTE — ED Provider Notes (Signed)
CSN: [id number]     Arrival date & time 12/17/12  0301 History   First MD Initiated Contact with Patient 12/17/12 0308     Chief Complaint  Patient presents with  . Allergic Reaction   (Consider location/radiation/quality/duration/timing/severity/associated sxs/prior Treatment) HPI Comments: Patient states she noticed the right side of her upper lip swelling.  About 8:00 last night about 10 the swelling had spread to the left side of her upper lip.  She took 50 mg of Benadryl, without any change came to the emergency department for further evaluation.  Denies any difficulty swallowing, breathing rash, previous history of any allergic reaction to foods or medications.  Patient, states she's been taking amlodipine, benazepril for years.  She's recently seen a endocrinologist, who has changed some of her diabetic medications approximately 6 weeks, ago.  Her sugars have been under better control since that time  Patient is a 42 y.o. female presenting with allergic reaction. The history is provided by the patient.  Allergic Reaction Presenting symptoms: swelling   Presenting symptoms: no difficulty breathing, no difficulty swallowing, no itching, no rash and no wheezing   Severity:  Moderate Prior allergic episodes:  No prior episodes Context: medications   Relieved by:  Nothing Ineffective treatments:  Antihistamines   Past Medical History  Diagnosis Date  . Diabetes mellitus     Type 2  . Hypertension   . Multiple sclerosis   . Endometriosis   . Depression    Past Surgical History  Procedure Laterality Date  . Lumbar disc surgery      LS spine  . Wisdom tooth extraction    . Exploratory laparotomy     Family History  Problem Relation Age of Onset  . Cancer Mother     Breast  . Diabetes Mother   . Diabetes Father   . Other Father     progressive supranuclear palsy   History  Substance Use Topics  . Smoking status: Never Smoker   . Smokeless tobacco: Not on file  .  Alcohol Use: Yes   OB History   Grav Para Term Preterm Abortions TAB SAB Ect Mult Living                 Review of Systems  Constitutional: Negative for fever.  HENT: Positive for facial swelling. Negative for drooling, trouble swallowing and voice change.   Respiratory: Negative for cough, shortness of breath and wheezing.   Skin: Negative for itching and rash.  Neurological: Negative for dizziness and headaches.  All other systems reviewed and are negative.    Allergies  Review of patient's allergies indicates no known allergies.  Home Medications   Current Outpatient Rx  Name  Route  Sig  Dispense  Refill  . cetirizine (ZYRTEC) 10 MG tablet   Oral   Take 10 mg by mouth daily.         . DULoxetine (CYMBALTA) 60 MG capsule   Oral   Take 1 capsule (60 mg total) by mouth at bedtime.   90 capsule   0   . hydrochlorothiazide (HYDRODIURIL) 25 MG tablet      TAKE 1 TABLET DAILY   90 tablet   1   . insulin detemir (LEVEMIR) 100 UNIT/ML injection   Subcutaneous   Inject 30 Units into the skin at bedtime.         . interferon beta-1a (REBIF) 44 MCG/0.5ML injection   Subcutaneous   Inject 22 mcg into the skin 3 (three) times  a week. M-W-F as directed 1/2 at present         . levonorgestrel-ethinyl estradiol (LYBREL) 90-20 MCG per tablet   Oral   Take 1 tablet by mouth daily.           . Liraglutide 18 MG/3ML SOPN      Inject 1.2 mg daily   2 pen   2   . metFORMIN (GLUMETZA) 1000 MG (MOD) 24 hr tablet   Oral   Take 2,000 mg by mouth at bedtime.         Marland Kitchen amLODipine (NORVASC) 10 MG tablet   Oral   Take 1 tablet (10 mg total) by mouth daily.   30 tablet   3   . modafinil (PROVIGIL) 200 MG tablet   Oral   Take 200 mg by mouth daily as needed.           BP 149/98  Pulse 116  Temp(Src) 98.1 F (36.7 C) (Oral)  Resp 22  SpO2 96% Physical Exam  Nursing note and vitals reviewed. Constitutional: She is oriented to person, place, and time. She  appears well-nourished.  HENT:  Head: Normocephalic.  Mouth/Throat: Uvula is midline, oropharynx is clear and moist and mucous membranes are normal. No oropharyngeal exudate, posterior oropharyngeal edema, posterior oropharyngeal erythema or tonsillar abscesses.    Eyes: Pupils are equal, round, and reactive to light.  Cardiovascular: Normal rate and regular rhythm.   Pulmonary/Chest: Effort normal and breath sounds normal.  Musculoskeletal: Normal range of motion.  Lymphadenopathy:    She has no cervical adenopathy.  Neurological: She is alert and oriented to person, place, and time.  Skin: Skin is warm. No rash noted.    ED Course  Procedures (including critical care time) Labs Review Labs Reviewed - No data to display Imaging Review No results found.  EKG Interpretation   None       MDM   1. Angioedema of lips, initial encounter    Slight improvement in swelling  Will change meds to Norvasc 10 mg I have sent Dr. Timoteo Gaul message to contact patient with any changes he feels are necessary prior to her trip on Saturday      Arman Filter, NP 12/17/12 [phone]

## 2012-12-19 ENCOUNTER — Emergency Department (HOSPITAL_COMMUNITY)
Admission: EM | Admit: 2012-12-19 | Discharge: 2012-12-19 | Disposition: A | Discharge status: Home / Self Care | Payer: BC Managed Care – PPO | Attending: Emergency Medicine | Admitting: Emergency Medicine

## 2012-12-19 ENCOUNTER — Encounter (HOSPITAL_COMMUNITY): Payer: Self-pay | Admitting: Emergency Medicine

## 2012-12-19 ENCOUNTER — Emergency Department (HOSPITAL_COMMUNITY): Payer: BC Managed Care – PPO

## 2012-12-19 DIAGNOSIS — IMO0002 Reserved for concepts with insufficient information to code with codable children: Secondary | ICD-10-CM | POA: Insufficient documentation

## 2012-12-19 DIAGNOSIS — Z3202 Encounter for pregnancy test, result negative: Secondary | ICD-10-CM | POA: Insufficient documentation

## 2012-12-19 DIAGNOSIS — Z8742 Personal history of other diseases of the female genital tract: Secondary | ICD-10-CM | POA: Insufficient documentation

## 2012-12-19 DIAGNOSIS — M546 Pain in thoracic spine: Secondary | ICD-10-CM | POA: Insufficient documentation

## 2012-12-19 DIAGNOSIS — G35 Multiple sclerosis: Secondary | ICD-10-CM | POA: Insufficient documentation

## 2012-12-19 DIAGNOSIS — R079 Chest pain, unspecified: Secondary | ICD-10-CM

## 2012-12-19 DIAGNOSIS — F419 Anxiety disorder, unspecified: Secondary | ICD-10-CM

## 2012-12-19 DIAGNOSIS — R002 Palpitations: Secondary | ICD-10-CM | POA: Insufficient documentation

## 2012-12-19 DIAGNOSIS — R0789 Other chest pain: Secondary | ICD-10-CM | POA: Insufficient documentation

## 2012-12-19 DIAGNOSIS — I10 Essential (primary) hypertension: Secondary | ICD-10-CM | POA: Insufficient documentation

## 2012-12-19 DIAGNOSIS — Z794 Long term (current) use of insulin: Secondary | ICD-10-CM | POA: Insufficient documentation

## 2012-12-19 DIAGNOSIS — F411 Generalized anxiety disorder: Secondary | ICD-10-CM | POA: Insufficient documentation

## 2012-12-19 DIAGNOSIS — F3289 Other specified depressive episodes: Secondary | ICD-10-CM | POA: Insufficient documentation

## 2012-12-19 DIAGNOSIS — Z79899 Other long term (current) drug therapy: Secondary | ICD-10-CM | POA: Insufficient documentation

## 2012-12-19 DIAGNOSIS — F329 Major depressive disorder, single episode, unspecified: Secondary | ICD-10-CM | POA: Insufficient documentation

## 2012-12-19 DIAGNOSIS — E119 Type 2 diabetes mellitus without complications: Secondary | ICD-10-CM | POA: Insufficient documentation

## 2012-12-19 LAB — URINALYSIS, ROUTINE W REFLEX MICROSCOPIC
Glucose, UA: >1000 mg/dL — AB
Hgb urine dipstick: NEGATIVE
Ketones, ur: NEGATIVE mg/dL
Nitrite: NEGATIVE
Protein, ur: 100 mg/dL — AB
Specific Gravity, Urine: 1.028 (ref 1.005–1.030)
Urobilinogen, UA: 0.2 mg/dL (ref 0.0–1.0)

## 2012-12-19 LAB — URINE MICROSCOPIC-ADD ON

## 2012-12-19 LAB — BASIC METABOLIC PANEL
Calcium: 9.7 mg/dL (ref 8.4–10.5)
Creatinine, Ser: 0.9 mg/dL (ref 0.50–1.10)
GFR calc Af Amer: 89 mL/min — ABNORMAL LOW (ref >90)
GFR calc non Af Amer: 77 mL/min — ABNORMAL LOW (ref >90)
Glucose, Bld: 251 mg/dL — ABNORMAL HIGH (ref 70–99)
Potassium: 3.2 mEq/L — ABNORMAL LOW (ref 3.5–5.1)
Sodium: 136 mEq/L (ref 135–145)

## 2012-12-19 LAB — CBC
Hemoglobin: 15.5 g/dL — ABNORMAL HIGH (ref 12.0–15.0)
MCH: 30 pg (ref 26.0–34.0)
MCHC: 34.6 g/dL (ref 30.0–36.0)
MCV: 86.8 fL (ref 78.0–100.0)
Platelets: 412 10*3/uL — ABNORMAL HIGH (ref 150–400)
RDW: 12.2 % (ref 11.5–15.5)

## 2012-12-19 LAB — TROPONIN I: Troponin I: <0.3 ng/mL (ref <0.30)

## 2012-12-19 LAB — POCT I-STAT TROPONIN I: Troponin i, poc: 0.01 ng/mL (ref 0.00–0.08)

## 2012-12-19 MED ORDER — LORAZEPAM 1 MG PO TABS: 1.0000 mg | ORAL_TABLET | Freq: Three times a day (TID) | ORAL | Status: DC | PRN

## 2012-12-19 MED ORDER — POTASSIUM CHLORIDE CRYS ER 20 MEQ PO TBCR: 40.0000 meq | EXTENDED_RELEASE_TABLET | Freq: Every day | ORAL | Status: DC

## 2012-12-19 MED ORDER — LORAZEPAM 1 MG PO TABS
1.0000 mg | ORAL_TABLET | Freq: Once | ORAL | Status: AC
  Administered 2012-12-19: 1 mg via ORAL
  Filled 2012-12-19: qty 1

## 2012-12-19 MED ORDER — HYDROCHLOROTHIAZIDE 25 MG PO TABS: 50.0000 mg | ORAL_TABLET | Freq: Every day | ORAL | Status: DC

## 2012-12-19 NOTE — ED Notes (Signed)
Pt states for the past week or so she has had pain in her back right above her kidney area that is like a dull nagging ache  Pt states she has had nausea for about 3 weeks since she started a new medication  Pt states this afternoon she started having pain in her left chest she describes as a soreness that radiates into her left arm  Pt states she has experienced some lightheadedness today too

## 2012-12-19 NOTE — ED Provider Notes (Signed)
CSN: [id number]     Arrival date & time 12/19/12  1954 History   First MD Initiated Contact with Patient 12/19/12 2128     Chief Complaint  Patient presents with  . Chest Pain  . Back Pain   (Consider location/radiation/quality/duration/timing/severity/associated sxs/prior Treatment) Patient is a 42 y.o. female presenting with chest pain and back pain. The history is provided by the patient. No language interpreter was used.  Chest Pain Associated symptoms: back pain, palpitations and shortness of breath   Associated symptoms: no cough and no fever   Associated symptoms comment:  She presents with chest tightness, upper back discomfort, a sense of restlessness and agitation - 'can't relax, can't sit still". She was seen 2 days ago in the Ed for angioedema and taken off Benacar. She feels symptoms of facial swelling are resolved. She is having shortness of breath with the chest tightness, without cough or fever. No vomiting but she has had nausea. No fever. She also reportsa recent medication adjustment to her diabetic medications which occurred on November 12 - Metformin to [facility] and she started Victoza. She has been doing well on these medications.  Back Pain Associated symptoms: chest pain   Associated symptoms: no fever     Past Medical History  Diagnosis Date  . Diabetes mellitus     Type 2  . Hypertension   . Multiple sclerosis   . Endometriosis   . Depression    Past Surgical History  Procedure Laterality Date  . Lumbar disc surgery      LS spine  . Wisdom tooth extraction    . Exploratory laparotomy     Family History  Problem Relation Age of Onset  . Cancer Mother     Breast  . Diabetes Mother   . Diabetes Father   . Other Father     progressive supranuclear palsy   History  Substance Use Topics  . Smoking status: Never Smoker   . Smokeless tobacco: Not on file  . Alcohol Use: Yes   OB History   Grav Para Term Preterm Abortions TAB SAB Ect Mult Living                  Review of Systems  Constitutional: Negative for fever and chills.  HENT: Negative.   Respiratory: Positive for shortness of breath. Negative for cough.   Cardiovascular: Positive for chest pain and palpitations.  Gastrointestinal: Negative.   Genitourinary: Negative.   Musculoskeletal: Positive for back pain.  Skin: Negative.   Neurological: Negative.   Psychiatric/Behavioral: Positive for agitation. The patient is nervous/anxious.     Allergies  Ace inhibitors  Home Medications   Current Outpatient Rx  Name  Route  Sig  Dispense  Refill  . amLODipine (NORVASC) 10 MG tablet   Oral   Take 1 tablet (10 mg total) by mouth daily.   30 tablet   3   . cetirizine (ZYRTEC) 10 MG tablet   Oral   Take 10 mg by mouth daily.         . cholecalciferol (VITAMIN D) 1000 UNITS tablet   Oral   Take 1,000 Units by mouth daily.         . DULoxetine (CYMBALTA) 60 MG capsule   Oral   Take 1 capsule (60 mg total) by mouth at bedtime.   90 capsule   0   . hydrochlorothiazide (HYDRODIURIL) 25 MG tablet      TAKE 1 TABLET DAILY   90 tablet  1   . insulin detemir (LEVEMIR) 100 UNIT/ML injection   Subcutaneous   Inject 30 Units into the skin at bedtime.         . interferon beta-1a (REBIF) 44 MCG/0.5ML injection   Subcutaneous   Inject 22 mcg into the skin 3 (three) times a week. S-T-Th as directed 1/2 at present         . Lactobacillus Rhamnosus, GG, (CULTURELLE) CAPS   Oral   Take 2 capsules by mouth daily. Taking daily to get ready for an overseas trip out of country         . levonorgestrel-ethinyl estradiol (LYBREL) 90-20 MCG per tablet   Oral   Take 1 tablet by mouth daily.           . Liraglutide 18 MG/3ML SOPN      Inject 1.2 mg daily   2 pen   2   . metFORMIN (GLUMETZA) 1000 MG (MOD) 24 hr tablet   Oral   Take 2,000 mg by mouth at bedtime.         . modafinil (PROVIGIL) 200 MG tablet   Oral   Take 200 mg by mouth daily as needed.           . hydrochlorothiazide (HYDRODIURIL) 25 MG tablet   Oral   Take 2 tablets (50 mg total) by mouth daily.   20 tablet   0   . LORazepam (ATIVAN) 1 MG tablet   Oral   Take 1 tablet (1 mg total) by mouth every 8 (eight) hours as needed for anxiety.   15 tablet   0   . potassium chloride SA (K-DUR,KLOR-CON) 20 MEQ tablet   Oral   Take 2 tablets (40 mEq total) by mouth daily.   3 tablet   0    BP 159/100  Pulse 91  Temp(Src) 98.4 F (36.9 C) (Oral)  Resp 18  SpO2 98% Physical Exam  Constitutional: She is oriented to person, place, and time. She appears well-developed and well-nourished.  HENT:  Head: Normocephalic.  Neck: Normal range of motion. Neck supple.  Cardiovascular: Normal rate and regular rhythm.   Pulmonary/Chest: Effort normal and breath sounds normal.  Abdominal: Soft. Bowel sounds are normal. There is no tenderness. There is no rebound and no guarding.  Musculoskeletal: Normal range of motion.  Neurological: She is alert and oriented to person, place, and time.  Skin: Skin is warm and dry. No rash noted.  Psychiatric: She has a normal mood and affect.    ED Course  Procedures (including critical care time) Labs Review Labs Reviewed  CBC - Abnormal; Notable for the following:    RBC 5.16 (*)    Hemoglobin 15.5 (*)    Platelets 412 (*)    All other components within normal limits  BASIC METABOLIC PANEL - Abnormal; Notable for the following:    Potassium 3.2 (*)    Chloride 94 (*)    Glucose, Bld 251 (*)    GFR calc non Af Amer 77 (*)    GFR calc Af Amer 89 (*)    All other components within normal limits  URINALYSIS, ROUTINE W REFLEX MICROSCOPIC - Abnormal; Notable for the following:    APPearance CLOUDY (*)    Glucose, UA >1000 (*)    Bilirubin Urine SMALL (*)    Protein, ur 100 (*)    All other components within normal limits  URINE MICROSCOPIC-ADD ON - Abnormal; Notable for the following:    Squamous  Epithelial / LPF FEW (*)     Bacteria, UA MANY (*)    All other components within normal limits  URINE CULTURE  TROPONIN I  POCT I-STAT TROPONIN I  POCT PREGNANCY, URINE   Results for orders placed during the hospital encounter of 12/19/12  URINE CULTURE      Result Value Range   Specimen Description URINE, CLEAN CATCH     Special Requests NONE     Culture  Setup Time       Value: 12/20/2012 03:35     Performed at [facility] Count       Value: 55,000 COLONIES/ML     Performed at [facility]   Culture       Value: Multiple bacterial morphotypes present, none predominant. Suggest appropriate recollection if clinically indicated.     Performed at [facility]   Report Status 12/21/2012 FINAL    CBC      Result Value Range   WBC 8.0  4.0 - 10.5 K/uL   RBC 5.16 (*) 3.87 - 5.11 MIL/uL   Hemoglobin 15.5 (*) 12.0 - 15.0 g/dL   HCT 16.1  09.6 - 04.5 %   MCV 86.8  78.0 - 100.0 fL   MCH 30.0  26.0 - 34.0 pg   MCHC 34.6  30.0 - 36.0 g/dL   RDW 40.9  81.1 - 91.4 %   Platelets 412 (*) 150 - 400 K/uL  BASIC METABOLIC PANEL      Result Value Range   Sodium 136  135 - 145 mEq/L   Potassium 3.2 (*) 3.5 - 5.1 mEq/L   Chloride 94 (*) 96 - 112 mEq/L   CO2 28  19 - 32 mEq/L   Glucose, Bld 251 (*) 70 - 99 mg/dL   BUN 13  6 - 23 mg/dL   Creatinine, Ser 7.82  0.50 - 1.10 mg/dL   Calcium 9.7  8.4 - 95.6 mg/dL   GFR calc non Af Amer 77 (*) >90 mL/min   GFR calc Af Amer 89 (*) >90 mL/min  URINALYSIS, ROUTINE W REFLEX MICROSCOPIC      Result Value Range   Color, Urine YELLOW  YELLOW   APPearance CLOUDY (*) CLEAR   Specific Gravity, Urine 1.028  1.005 - 1.030   pH 6.0  5.0 - 8.0   Glucose, UA >1000 (*) NEGATIVE mg/dL   Hgb urine dipstick NEGATIVE  NEGATIVE   Bilirubin Urine SMALL (*) NEGATIVE   Ketones, ur NEGATIVE  NEGATIVE mg/dL   Protein, ur 213 (*) NEGATIVE mg/dL   Urobilinogen, UA 0.2  0.0 - 1.0 mg/dL   Nitrite NEGATIVE  NEGATIVE   Leukocytes, UA NEGATIVE  NEGATIVE  TROPONIN  I      Result Value Range   Troponin I <0.30  <0.30 ng/mL  URINE MICROSCOPIC-ADD ON      Result Value Range   Squamous Epithelial / LPF FEW (*) RARE   WBC, UA 3-6  <3 WBC/hpf   Bacteria, UA MANY (*) RARE   Urine-Other MUCOUS PRESENT    POCT I-STAT TROPONIN I      Result Value Range   Troponin i, poc 0.01  0.00 - 0.08 ng/mL   Comment 3           POCT PREGNANCY, URINE      Result Value Range   Preg Test, Ur NEGATIVE  NEGATIVE  Dg Chest Port 1 View  12/19/2012   CLINICAL DATA:  Chest  tightness.  EXAM: PORTABLE CHEST - 1 VIEW  COMPARISON:  None.  FINDINGS: The heart size and mediastinal contours are within normal limits. Both lungs are clear. The visualized skeletal structures are unremarkable.  IMPRESSION: No active disease.   Electronically Signed   By: Signa Kell M.D.   On: 12/19/2012 21:23    Imaging Review No results found.  EKG Interpretation    Date/Time:  Friday December 19 2012 20:19:54 EST Ventricular Rate:  96 PR Interval:  157 QRS Duration: 72 QT Interval:  328 QTC Calculation: 414 R Axis:   -31 Text Interpretation:  Sinus rhythm Nonspecific T wave abnormality No previous tracing Confirmed by Denton Lank  MD, KEVIN ([id number]) on 12/19/2012 10:14:51 PM            MDM   1. Chest pain   2. Anxiety    1. Chest pain 2. Anxiety  She has been comfortable here. No concerning EKG abnormalities, neg CXR/labs. History of similar symptoms and appears stable now. She can go home safely and should follow up with her PCP.    Arnoldo Hooker, PA-C 12/29/12 [phone]

## 2012-12-21 LAB — URINE CULTURE

## 2012-12-25 ENCOUNTER — Ambulatory Visit: Payer: BC Managed Care – PPO | Admitting: Internal Medicine

## 2012-12-25 DIAGNOSIS — Z0289 Encounter for other administrative examinations: Secondary | ICD-10-CM

## 2012-12-29 ENCOUNTER — Encounter: Payer: Self-pay | Admitting: Internal Medicine

## 2012-12-29 ENCOUNTER — Ambulatory Visit (INDEPENDENT_AMBULATORY_CARE_PROVIDER_SITE_OTHER): Payer: BC Managed Care – PPO | Admitting: Internal Medicine

## 2012-12-29 VITALS — BP 138/98 | HR 106 | Temp 97.8°F | Resp 10 | Wt 216.0 lb

## 2012-12-29 DIAGNOSIS — E1159 Type 2 diabetes mellitus with other circulatory complications: Secondary | ICD-10-CM

## 2012-12-29 MED ORDER — METFORMIN HCL ER (MOD) 1000 MG PO TB24: 2000.0000 mg | ORAL_TABLET | Freq: Every day | ORAL | Status: DC

## 2012-12-29 NOTE — Patient Instructions (Signed)
Continue Metformin (Glumetza) 2000 mg at night, as tolerated. Continue Victoza at 1.8 mg daily in am. Continue Levemir 30 units at night. Start Invokana 100 mg in am and increase to 300 mg after a week. Stay hydrated while on Invokana. Can stop the second HCTZ dose while you are on Invokana. Please return in 1 month with your sugar log.

## 2012-12-29 NOTE — Progress Notes (Signed)
Patient ID: Kristin Mathews, female   DOB: [birth year], 42 y.o.   MRN: [id number]  HPI: Kristin Mathews is a 42 y.o.-year-old female, referred by her PCP, Dr. Cato Mulligan, for management of DM2, dx 2009, insulin-dependent since 2009, uncontrolled, with complications.  She was in the ED with Angioedema from ACEI 12/19/2012 >>< given prednisone >> sugars subsequently 200s, then anxiety attack.   She was in [place] in vacation until last week.   Last hemoglobin A1c was: Lab Results  Component Value Date   HGBA1C 8.0* 11/12/2012   HGBA1C 7.3* 05/13/2012   HGBA1C 6.8* 08/21/2011   Pt is on a regimen of: - Glumetza 1000 mg po at night >> nausea << had to decrease from 2000 mg daily - Levemir 30 units at night - Victoza 1.8 mg daily >> tolerates it well We stopped Humulin 70/30 25 units bid. Januvia - did not help.   She lost 16 lbs in last month.  Pt checks her sugars 3-4x a day: - am: 107-231  - 2h after b'fast: 197 - lunch: 136-236 - 2h after lunch: 189-257 - dinner: 137-222 - 2h after dinner: 227-241 - bedtime: 156-251 Has lows once a mo.Lowest sugar was 50s; she has hypoglycemia awareness at 50s.  Highest sugar was during an MS exacerebation in 02/2012 (iv solumedrol) >> HI. Off solumedrol, mid 200s.  Pt's meals are: - Breakfast: yoghurt + granola, cereals - Lunch: sandwich, sushi, [place] - Dinner: home cooked meal: stews, etc. - Snacks: 3-4  - no CKD, last BUN/creatinine:  Lab Results  Component Value Date   BUN 13 12/19/2012   CREATININE 0.90 12/19/2012  On benazepril.  - last set of lipids: Lab Results  Component Value Date   CHOL 178 11/12/2012   HDL 42.80 11/12/2012   LDLCALC 105* 11/12/2012   LDLDIRECT 112.1 10/13/2009   TRIG 153.0* 11/12/2012   CHOLHDL 4 11/12/2012  Not on a statin.  - last eye exam was last year.  No DR.  - mild numbness and tingling in her feet.  She also has a history of MS (neuro Dr Despina Arias) - last 2 atacks: 02/2012, 2010; also HTN,  borderline HL, fatty liver, endometriosis.  I reviewed pt's medications, allergies, PMH, social hx, family hx and no changes required, except as mentioned above.  ROS: Constitutional: + weight loss, + decreased appetite, + fatigue, no subjective hyperthermia Eyes: + blurry vision, no xerophthalmia ENT: no sore throat, no nodules palpated in throat, no dysphagia/odynophagia, no hoarseness Cardiovascular: no CP/SOB/palpitations/leg swelling Respiratory: no cough/SOB Gastrointestinal: no N/V/D/no C Musculoskeletal: + muscle aches/no joint aches Skin: no rashes Neurological: no tremors/numbness/tingling/dizziness  PE: BP 138/98  Pulse 106  Temp(Src) 97.8 F (36.6 C) (Oral)  Resp 10  Wt 216 lb (97.977 kg)  SpO2 98% Wt Readings from Last 3 Encounters:  12/29/12 216 lb (97.977 kg)  11/27/12 232 lb (105.235 kg)  11/24/12 230 lb 14.4 oz (104.736 kg)   Constitutional: overweight, in NAD Eyes: PERRLA, EOMI, no exophthalmos ENT: moist mucous membranes, no thyromegaly, no cervical lymphadenopathy Cardiovascular: RRR, No MRG Respiratory: CTA B Gastrointestinal: abdomen soft, NT, ND, BS+ Musculoskeletal: no deformities, strength intact in all 4 Skin: moist, warm, no rashes Neurological: no tremor with outstretched hands  ASSESSMENT: 1. DM2, insulin-dependent, uncontrolled, without complications  PLAN:  1. Patient with a 5 year h/o DM2 (exacerrbated by steroid use for MS), recently more controlled after switching to Metformin + Victoza + Lantus from Premixed insulin + metformin. I congratulated her  for 16 pound weight loss in the last month! - We discussed about options for treatment, and I suggested to:  Patient Instructions  Continue Metformin (Glumetza) 2000 mg at night, as tolerated. Continue Victoza at 1.8 mg daily in am. Continue Levemir 30 units at night. Start Invokana 100 mg in am and increase to 300 mg after a week. Stay hydrated while on Invokana. Can stop the second HCTZ  dose while you are on Invokana. Please return in 1 month with your sugar log.  - we discussed about SEs of Invokana, which are: dizziness (advised to be careful when stands from sitting position), decreased BP - usually not < normal (BP today is not low), and fungal UTIs (advised to let me know if develops one).  - she was taken off the ACE inhibitor during her ED visit for angioedema, and advised to take 2 hydrochlorothiazide tablets daily (50 mg total). She is also on amlodipine. Her blood pressure today is 138/98, with slightly higher diastolic. I advised her to drop the HCTZ dose to 25 mg daily while on Invokana. - sent Rx for of Glumetza - will need mealtime insulin if starts solumedrol again, she had only 2 days of Prednisone for ACEI-related angioedema - continue checking sugars at different times of the day - check 2 times a day, rotating checks - given new sugar logs  - she is up to date with yearly eye exams - had the flu vaccine this season - Return to clinic in 1 mo with sugar log

## 2012-12-29 NOTE — ED Provider Notes (Signed)
Medical screening examination/treatment/procedure(s) were performed by non-physician practitioner and as supervising physician I was immediately available for consultation/collaboration.  EKG Interpretation    Date/Time:  Friday December 19 2012 20:19:54 EST Ventricular Rate:  96 PR Interval:  157 QRS Duration: 72 QT Interval:  328 QTC Calculation: 414 R Axis:   -31 Text Interpretation:  Sinus rhythm Nonspecific T wave abnormality No previous tracing Confirmed by Denton Lank  MD, Lorrain Rivers ([id number]) on 12/19/2012 10:14:51 PM              Lyanne Co, MD 12/29/12 [phone]

## 2012-12-30 ENCOUNTER — Telehealth: Payer: Self-pay | Admitting: Internal Medicine

## 2012-12-30 NOTE — Telephone Encounter (Signed)
Monitor bp at home 3 times weekly  Start carvedilol 6.25 mg po bid. #180/3 refills

## 2012-12-30 NOTE — Telephone Encounter (Signed)
Patient Information:  Caller Name: Shericka  Phone: [phone]  Patient: Kristin Mathews, Kristin Mathews  Gender: Female  DOB: [birth year]  Age: 42 Years  PCP: N/A  Pregnant: No  Office Follow Up:  Does the office need to follow up with this patient?: Yes  Instructions For The Office: Medication questions/changes krs/can  RN Note:  Patient states started ACE inhibitor, and her face swelled overnight 12/17/12.  Seen in ED and told to get off all BP medications except her amlodipine.  States is taking amlodipine only, but needs a different class of medications to control her blood pressure.  States she was told Dr. Cato Mulligan would call back with new Rx, but she has not heard back in over 10 days.  Is back in the country now and needs to speak with MD or have him change her BP medications.  States ED MD was to email Dr. Cato Mulligan.  Per Epic, ED note is present in chart; info to office for provder review/Rx/callback.  Uses [facility] for trial, or [facility] for long-term Rx.  May reach patient at [phone].  krs/can  Symptoms  Reason For Call & Symptoms: allergic reaction to BP medication  Reviewed Health History In EMR: Yes  Reviewed Medications In EMR: Yes  Reviewed Allergies In EMR: Yes  Reviewed Surgeries / Procedures: Yes  Date of Onset of Symptoms: 12/30/2012 OB / GYN:  LMP: Unknown  Guideline(s) Used:  No Protocol Available - Information Only  Disposition Per Guideline:   Discuss with PCP and Callback by Nurse Today  Reason For Disposition Reached:   Nursing judgment  Advice Given:  N/A  Patient Will Follow Care Advice:  YES

## 2012-12-31 ENCOUNTER — Telehealth: Payer: Self-pay | Admitting: Internal Medicine

## 2012-12-31 ENCOUNTER — Other Ambulatory Visit: Payer: Self-pay | Admitting: *Deleted

## 2012-12-31 MED ORDER — CARVEDILOL 6.25 MG PO TABS: 6.2500 mg | ORAL_TABLET | Freq: Two times a day (BID) | ORAL | Status: DC

## 2012-12-31 MED ORDER — GLUCOSE BLOOD VI STRP: ORAL_STRIP | Status: DC

## 2012-12-31 NOTE — Telephone Encounter (Signed)
Left message for pt to call back  °

## 2012-12-31 NOTE — Telephone Encounter (Signed)
30 day supply sent in to [facility] per pt request

## 2012-12-31 NOTE — Telephone Encounter (Signed)
Caller: [name]/Patient; Phone: [phone]; Reason for Call: Transferred to [place], CMA.

## 2013-01-07 ENCOUNTER — Other Ambulatory Visit: Payer: BC Managed Care – PPO

## 2013-01-12 ENCOUNTER — Other Ambulatory Visit: Payer: Self-pay | Admitting: Internal Medicine

## 2013-01-14 ENCOUNTER — Other Ambulatory Visit: Payer: Self-pay | Admitting: *Deleted

## 2013-01-14 MED ORDER — CANAGLIFLOZIN 300 MG PO TABS: ORAL_TABLET | ORAL | Status: DC

## 2013-01-14 NOTE — Telephone Encounter (Signed)
Pt called and states she is doing well on the Invokana and asked for a rx for 300 mg be sent to [facility]. Rx sent to [facility].

## 2013-01-26 ENCOUNTER — Other Ambulatory Visit: Payer: Self-pay | Admitting: *Deleted

## 2013-01-26 MED ORDER — LIRAGLUTIDE 18 MG/3ML ~~LOC~~ SOPN: PEN_INJECTOR | SUBCUTANEOUS | Status: DC

## 2013-02-02 ENCOUNTER — Ambulatory Visit: Payer: BC Managed Care – PPO | Admitting: Internal Medicine

## 2013-02-04 ENCOUNTER — Other Ambulatory Visit: Payer: Self-pay | Admitting: Internal Medicine

## 2013-02-13 ENCOUNTER — Other Ambulatory Visit: Payer: BC Managed Care – PPO

## 2013-03-30 ENCOUNTER — Other Ambulatory Visit: Payer: Self-pay | Admitting: *Deleted

## 2013-03-30 ENCOUNTER — Telehealth: Payer: Self-pay | Admitting: Internal Medicine

## 2013-03-30 MED ORDER — LIRAGLUTIDE 18 MG/3ML ~~LOC~~ SOPN: PEN_INJECTOR | SUBCUTANEOUS | Status: DC

## 2013-03-30 MED ORDER — CARVEDILOL 6.25 MG PO TABS: ORAL_TABLET | ORAL | Status: DC

## 2013-03-30 MED ORDER — INSULIN DETEMIR 100 UNIT/ML FLEXPEN: PEN_INJECTOR | SUBCUTANEOUS | Status: DC

## 2013-03-30 NOTE — Telephone Encounter (Signed)
[facility] requesting re-fill on carvedilol (COREG) 6.25 MG tablet

## 2013-03-30 NOTE — Telephone Encounter (Signed)
rx sent in electronically 

## 2013-04-01 ENCOUNTER — Other Ambulatory Visit: Payer: Self-pay | Admitting: *Deleted

## 2013-04-01 ENCOUNTER — Telehealth: Payer: Self-pay | Admitting: Internal Medicine

## 2013-04-01 ENCOUNTER — Telehealth: Payer: Self-pay | Admitting: *Deleted

## 2013-04-01 MED ORDER — AMLODIPINE BESYLATE 10 MG PO TABS: 10.0000 mg | ORAL_TABLET | Freq: Every day | ORAL | Status: DC

## 2013-04-01 MED ORDER — METFORMIN HCL ER 500 MG PO TB24: ORAL_TABLET | ORAL | Status: DC

## 2013-04-01 MED ORDER — GLUCOSE BLOOD VI STRP: ORAL_STRIP | Status: DC

## 2013-04-01 NOTE — Telephone Encounter (Signed)
Pt request 3 mo supply amLODipine (NORVASC) 10 MG tablet [facility]

## 2013-04-01 NOTE — Telephone Encounter (Signed)
Insurance states needs to change rx from [place]? To XR metformin

## 2013-04-01 NOTE — Telephone Encounter (Signed)
Rx sent 

## 2013-04-01 NOTE — Telephone Encounter (Signed)
I am not sure if she could tolarate Metformin XR in the past >> can you please check with her. If she did not try a generic XR, we can try that instead of Glumetza.... She needs a new appt soon.

## 2013-04-01 NOTE — Telephone Encounter (Signed)
No, we can try it now so we can find out by then if Metformin XR works for her. Can you please send?

## 2013-04-01 NOTE — Telephone Encounter (Signed)
Called pt and she stated she is not leaving until April 12th. Do you want to discuss change of medication during a follow up appt? Please advise.

## 2013-04-01 NOTE — Telephone Encounter (Signed)
Per Kristin Mathews, pt called stating insurance nees to change rx from [place]? To XR metformin. Please advise.

## 2013-04-01 NOTE — Telephone Encounter (Signed)
rx sent in electronically 

## 2013-04-01 NOTE — Telephone Encounter (Signed)
Spoke with pt, she needed a 30 day supply sent to her local pharmacy so she can begin her medication and a 90 day supply to [facility]. Pt will be leaving to move to the [place] for 4 mos for work.

## 2013-04-01 NOTE — Telephone Encounter (Signed)
Called pt and asked her if she has taken Metformin XR in the past. Pt stated she has not taken it before. Also, pt will be leaving the country to work abroad for 4 months. Advised pt that she needs to be seen prior to this. We will schedule an appt this afternoon for her follow up. Please advise.

## 2013-04-01 NOTE — Telephone Encounter (Signed)
I am off this afternoon.Marland KitchenMarland Kitchen

## 2013-04-03 LAB — HM DIABETES EYE EXAM

## 2013-04-10 LAB — HM DIABETES EYE EXAM

## 2013-04-14 ENCOUNTER — Encounter: Payer: Self-pay | Admitting: Internal Medicine

## 2013-04-14 ENCOUNTER — Ambulatory Visit (INDEPENDENT_AMBULATORY_CARE_PROVIDER_SITE_OTHER): Payer: BC Managed Care – PPO | Admitting: Internal Medicine

## 2013-04-14 VITALS — BP 126/82 | HR 90 | Temp 97.9°F | Resp 12 | Wt 203.0 lb

## 2013-04-14 DIAGNOSIS — E1159 Type 2 diabetes mellitus with other circulatory complications: Secondary | ICD-10-CM

## 2013-04-14 LAB — HEMOGLOBIN A1C: HEMOGLOBIN A1C: 7.1 % — AB (ref 4.6–6.5)

## 2013-04-14 MED ORDER — METFORMIN HCL ER (OSM) 1000 MG PO TB24: ORAL_TABLET | ORAL | Status: DC

## 2013-04-14 MED ORDER — FLUCONAZOLE 150 MG PO TABS: 150.0000 mg | ORAL_TABLET | Freq: Once | ORAL | Status: DC

## 2013-04-14 MED ORDER — INSULIN DETEMIR 100 UNIT/ML FLEXPEN: PEN_INJECTOR | SUBCUTANEOUS | Status: DC

## 2013-04-14 MED ORDER — CANAGLIFLOZIN 300 MG PO TABS: ORAL_TABLET | ORAL | Status: DC

## 2013-04-14 MED ORDER — LIRAGLUTIDE 18 MG/3ML ~~LOC~~ SOPN: PEN_INJECTOR | SUBCUTANEOUS | Status: DC

## 2013-04-14 NOTE — Progress Notes (Signed)
Patient ID: Kristin Mathews, female   DOB: [birth year], 43 y.o.   MRN: [id number]  HPI: Kristin Mathews is a 43 y.o.-year-old female, returning for f/u for DM2, dx 2009, insulin-dependent since 2009, uncontrolled, with complications.   Last hemoglobin A1c was: Lab Results  Component Value Date   HGBA1C 8.0* 11/12/2012   HGBA1C 7.3* 05/13/2012   HGBA1C 6.8* 08/21/2011   Pt is on a regimen of: - Metformin ER 1000 mg po bid - Levemir 30 units at night - Victoza 1.8 mg daily  - Invokana 100 >> 300 >> had few yeast infections, has one now. Tried OTC meds >> recurred.  We stopped Humulin 70/30 25 units bid. Januvia - did not help.  Glumetza not covered by insurance.  She lost 16 lbs before last visit and 13 more lbs since last visit.  Pt checks her sugars 3-4x a day (does not bring log): - am: 107-231 >> 110-120 (150) - 2h after b'fast: 197 >> n/c - lunch: 136-236 >> 150-200 - 2h after lunch: 189-257 >> n/c - dinner: 137-222 >> n/c - 2h after dinner: 227-241 >> n/c - bedtime: 156-251>> 160-210 No lows; she has hypoglycemia awareness at 50s.   Pt's meals are: - Breakfast: yoghurt + granola, cereals - Lunch: sandwich, sushi, [place] - Dinner: home cooked meal: stews, etc. - Snacks: 3-4  - no CKD, last BUN/creatinine:  Lab Results  Component Value Date   BUN 13 12/19/2012   CREATININE 0.90 12/19/2012  Was on benazepril. Angioedema from ACEI 12/19/2012! - last set of lipids: Lab Results  Component Value Date   CHOL 178 11/12/2012   HDL 42.80 11/12/2012   LDLCALC 105* 11/12/2012   LDLDIRECT 112.1 10/13/2009   TRIG 153.0* 11/12/2012   CHOLHDL 4 11/12/2012  Not on a statin.  - last eye exam was 04/10/2013.  No DR.  - mild numbness and tingling in her feet.  She also has a history of MS (neuro Dr Arlice Colt) - last 2 atacks: 02/2012, 2010; also HTN, borderline HL, fatty liver, endometriosis.  I reviewed pt's medications, allergies, PMH, social hx, family hx and no changes required,  except as mentioned above.  ROS: Constitutional: + weight loss, + decreased appetite, + fatigue, no subjective hyperthermia Eyes: + blurry vision, no xerophthalmia ENT: no sore throat, no nodules palpated in throat, no dysphagia/odynophagia, no hoarseness Cardiovascular: no CP/SOB/palpitations/leg swelling Respiratory: no cough/SOB Gastrointestinal: no N/V/D/no C Musculoskeletal: + muscle aches/no joint aches Skin: no rashes Neurological: no tremors/numbness/tingling/dizziness  PE: BP 126/82  Pulse 90  Temp(Src) 97.9 F (36.6 C) (Oral)  Resp 12  Wt 203 lb (92.08 kg)  SpO2 95% Wt Readings from Last 3 Encounters:  04/14/13 203 lb (92.08 kg)  12/29/12 216 lb (97.977 kg)  11/27/12 232 lb (105.235 kg)   Constitutional: overweight, in NAD Eyes: PERRLA, EOMI, no exophthalmos ENT: moist mucous membranes, no thyromegaly, no cervical lymphadenopathy Cardiovascular: RRR, No MRG Respiratory: CTA B Gastrointestinal: abdomen soft, NT, ND, BS+ Musculoskeletal: no deformities, strength intact in all 4 Skin: moist, warm, no rashes Neurological: no tremor with outstretched hands  ASSESSMENT: 1. DM2, insulin-dependent, uncontrolled, without complications  PLAN:  1. Patient with a 5 year h/o DM2 (exacerbated by steroid use for MS), recently more controlled after switching to Metformin + Victoza + Lantus from Premixed insulin + metformin. Sugars not quite at goal, but improved. She continues to lose weight! She is leaving next month to spend the summer in [place]. She needs refills of her  meds. -  I suggested to:  Patient Instructions  Please continue current regimen. Please return in 6 months with your sugar log.  - she has SEs of Invokana (yeast inf) but would like to continue Invokana. She tried only OTC meds for this >> I called in Fluconazole and gave her 2 tabs: take 1 now and take one with her in [place]. No dizziness, orthostasis. - sent Rx's for all her DM meds for 3 mo - will need  mealtime insulin if starts steroids for MS again - continue checking sugars at different times of the day - check 2 times a day, rotating checks >> need log at next visit - she is up to date with yearly eye exams - check A1c today - Return to clinic in 6 mo with sugar log   Office Visit on 04/14/2013  Component Date Value Ref Range Status  . HM Diabetic Eye Exam 04/10/2013 No Retinopathy  No Retinopathy Final  . Hemoglobin A1C 04/14/2013 7.1* 4.6 - 6.5 % Final   Glycemic Control Guidelines for People with Diabetes:Non Diabetic:  <6%Goal of Therapy: <7%Additional Action Suggested:  >8%    Excellent A1c!

## 2013-04-14 NOTE — Patient Instructions (Signed)
Please continue current regimen. Please return in 6 months with your sugar log.

## 2013-04-21 ENCOUNTER — Telehealth: Payer: Self-pay | Admitting: Internal Medicine

## 2013-04-21 NOTE — Telephone Encounter (Signed)
Pt is needing new rx DULoxetine (CYMBALTA) 60 MG capsule, [facility].

## 2013-04-22 NOTE — Telephone Encounter (Signed)
Pt was seen 11/27/12 by Abby Potash, pt wants to know why she has to come in, states she has a rx for DULoxetine (CYMBALTA) 60 MG capsule, for 3 months however she is needing a 6 month supply because she is leaving to go out of the country and wants to ensure she has enough to last her while she is gone.

## 2013-04-22 NOTE — Telephone Encounter (Signed)
Pt needs an appt with Padonda or Matt.  Has not been seen since 2013

## 2013-04-28 NOTE — Telephone Encounter (Signed)
Pt did see NP for CPE 11/2012, however, office note says she was to return in 6 weeks for a follow up. Cymbalta was filled by Dr. Leanne Chang in Jan 2015. Looks like pt needs a follow with [place] or [employer]

## 2013-04-28 NOTE — Telephone Encounter (Signed)
Pt seen np in nov 2014 for annual exam.

## 2013-04-28 NOTE — Telephone Encounter (Signed)
lmom for pt to return my call. 

## 2013-04-29 NOTE — Telephone Encounter (Signed)
lmom for pt to return my call. 

## 2013-04-29 NOTE — Telephone Encounter (Signed)
Pt is sch with dr Elease Hashimoto tomorrow

## 2013-04-30 ENCOUNTER — Ambulatory Visit (INDEPENDENT_AMBULATORY_CARE_PROVIDER_SITE_OTHER): Payer: BC Managed Care – PPO | Admitting: Family Medicine

## 2013-04-30 ENCOUNTER — Encounter: Payer: Self-pay | Admitting: Family Medicine

## 2013-04-30 VITALS — BP 130/78 | HR 85 | Temp 97.5°F | Wt 204.0 lb

## 2013-04-30 DIAGNOSIS — F3289 Other specified depressive episodes: Secondary | ICD-10-CM

## 2013-04-30 DIAGNOSIS — F329 Major depressive disorder, single episode, unspecified: Secondary | ICD-10-CM

## 2013-04-30 MED ORDER — DULOXETINE HCL 60 MG PO CPEP: 60.0000 mg | ORAL_CAPSULE | Freq: Every day | ORAL | Status: DC

## 2013-04-30 NOTE — Progress Notes (Signed)
   Subjective:    Patient ID: Kristin Mathews, female    DOB: 1970/11/25, 43 y.o.   MRN: [id number]  HPI Patient has multiple chronic medical problems. She is seen today basically needing refills of Cymbalta which she is taking for depression for several years. She's getting ready to move to [place] for the summer for several months and is trying to get 6 month prescriptions for all of her medication. Her depression is stable. She has other chronic problems including history of obesity, type 2 diabetes, hypertension, dyslipidemia, multiple sclerosis, and history of endometriosis. She is followed by endocrinology and neurology. She's had all of her other medications refilled. Diabetes has been improving. She has lost weight with change of diabetes medications recently.  Past Medical History  Diagnosis Date  . Diabetes mellitus     Type 2  . Hypertension   . Multiple sclerosis   . Endometriosis   . Depression    Past Surgical History  Procedure Laterality Date  . Lumbar disc surgery      LS spine  . Wisdom tooth extraction    . Exploratory laparotomy      reports that she has never smoked. She does not have any smokeless tobacco history on file. She reports that she drinks alcohol. She reports that she does not use illicit drugs. family history includes Cancer in her mother; Diabetes in her father and mother; Other in her father. Allergies  Allergen Reactions  . Ace Inhibitors Swelling      Review of Systems  Constitutional: Negative for appetite change.  Respiratory: Negative for shortness of breath.   Cardiovascular: Negative for chest pain.  Psychiatric/Behavioral: Negative for dysphoric mood.       Objective:   Physical Exam  Constitutional: She appears well-developed and well-nourished.  Cardiovascular: Normal rate and regular rhythm.   Pulmonary/Chest: Effort normal and breath sounds normal. No respiratory distress. She has no wheezes. She has no rales.    Musculoskeletal: She exhibits no edema.  Psychiatric: She has a normal mood and affect. Her behavior is normal.          Assessment & Plan:  History of depression which is currently stable. Refill Cymbalta for one year. She will continue close followup with endocrinology.

## 2013-04-30 NOTE — Progress Notes (Signed)
Pre visit review using our clinic review tool, if applicable. No additional management support is needed unless otherwise documented below in the visit note. 

## 2013-08-19 ENCOUNTER — Telehealth: Payer: Self-pay

## 2013-08-19 NOTE — Telephone Encounter (Signed)
Diabetic Bundle. Pt scheduled for 10/13/2013 with Dr. Cruzita Lederer. Lipid will not be able to be collected till after 10/29.

## 2013-09-03 ENCOUNTER — Other Ambulatory Visit: Payer: Self-pay | Admitting: Internal Medicine

## 2013-09-05 ENCOUNTER — Other Ambulatory Visit: Payer: Self-pay | Admitting: Internal Medicine

## 2013-10-13 ENCOUNTER — Ambulatory Visit: Payer: BC Managed Care – PPO | Admitting: Internal Medicine

## 2013-11-02 ENCOUNTER — Encounter: Payer: Self-pay | Admitting: Internal Medicine

## 2013-11-02 ENCOUNTER — Other Ambulatory Visit: Payer: Self-pay | Admitting: *Deleted

## 2013-11-02 ENCOUNTER — Ambulatory Visit (INDEPENDENT_AMBULATORY_CARE_PROVIDER_SITE_OTHER): Payer: BC Managed Care – PPO | Admitting: Internal Medicine

## 2013-11-02 VITALS — BP 124/82 | HR 80 | Temp 98.3°F | Resp 12 | Wt 201.8 lb

## 2013-11-02 DIAGNOSIS — Z23 Encounter for immunization: Secondary | ICD-10-CM

## 2013-11-02 DIAGNOSIS — E1159 Type 2 diabetes mellitus with other circulatory complications: Secondary | ICD-10-CM

## 2013-11-02 MED ORDER — GLIPIZIDE ER 10 MG PO TB24: 10.0000 mg | ORAL_TABLET | Freq: Every day | ORAL | Status: DC

## 2013-11-02 NOTE — Patient Instructions (Signed)
Please continue: - Metformin ER but change to 1000 mg 2x a day - Levemir 40 units at night  - Victoza 1.8 mg daily in am Add Glipizide XL 10 mg daily in am.  Please return in 1.5 month with your sugar log.   Please stop at the lab.

## 2013-11-02 NOTE — Progress Notes (Signed)
Patient ID: Kristin Mathews, female   DOB: 1970/02/16, 43 y.o.   MRN: [id number]  HPI: Kristin Mathews is a 43 y.o.-year-old female, returning for f/u for DM2, dx 2009, insulin-dependent since 2009, uncontrolled, with complications.   Last hemoglobin A1c was: Lab Results  Component Value Date   HGBA1C 7.1* 04/14/2013   HGBA1C 8.0* 11/12/2012   HGBA1C 7.3* 05/13/2012   Pt is on a regimen of: - Metformin ER 2000 mg at bedtime - Levemir 30 >> 40 units at night - Victoza 1.8 mg daily  She stopped Invokana 100 >> 300 >> had few yeast infections - stopped in 05/2013.  We stopped Humulin 70/30 25 units bid. Januvia - did not help.  Glumetza not covered by insurance.  Pt checks her sugars 3-4x a day (reviewed log) - sugars higher after stopping Invokana, especially later in the day: - am: 107-231 >> 110-120 (150) >> 111-155 - 2h after b'fast: 197 >> n/c >> 127 - lunch: 136-236 >> 150-200 >> 105-187, 271 - 2h after lunch: 189-257 >> n/c >> 240 - dinner: 137-222 >> n/c >> 98, 148-220 - 2h after dinner: 227-241 >> n/c  - bedtime: 156-251>> 160-210 >> 147, 275-316 No lows; she has hypoglycemia awareness at 50s.   Pt's meals are: - Breakfast: yoghurt + granola, cereals - Lunch: sandwich, sushi, [place] - Dinner: home cooked meal: stews, etc. - Snacks: 3-4  - no CKD, last BUN/creatinine:  Lab Results  Component Value Date   BUN 13 12/19/2012   CREATININE 0.90 12/19/2012  Was on benazepril. Angioedema from ACEI 12/19/2012! - last set of lipids: Lab Results  Component Value Date   CHOL 178 11/12/2012   HDL 42.80 11/12/2012   LDLCALC 105* 11/12/2012   LDLDIRECT 112.1 10/13/2009   TRIG 153.0* 11/12/2012   CHOLHDL 4 11/12/2012  Not on a statin.  - last eye exam was 04/10/2013.  No DR.  - mild numbness and tingling in her feet.  She also has a history of MS (neuro Dr Arlice Colt) - last 2 atacks: 02/2012, 2010; also HTN, borderline HL, fatty liver, endometriosis.  I reviewed pt's  medications, allergies, PMH, social hx, family hx and no changes required, except as mentioned above.  ROS: Constitutional: no weight loss, + fatigue, no subjective hyperthermia Eyes: no blurry vision, no xerophthalmia ENT: no sore throat, no nodules palpated in throat, no dysphagia/odynophagia, no hoarseness Cardiovascular: no CP/SOB/palpitations/leg swelling Respiratory: no cough/SOB Gastrointestinal: + N/no V/+ D/no C Musculoskeletal: no muscle aches/no joint aches Skin: no rashes, + hair loss Neurological: no tremors/numbness/tingling/dizziness  PE: BP 124/82  Pulse 80  Temp(Src) 98.3 F (36.8 C) (Oral)  Resp 12  Wt 201 lb 12.8 oz (91.536 kg)  SpO2 96% Wt Readings from Last 3 Encounters:  11/02/13 201 lb 12.8 oz (91.536 kg)  04/30/13 204 lb (92.534 kg)  04/14/13 203 lb (92.08 kg)   Constitutional: overweight, in NAD Eyes: PERRLA, EOMI, no exophthalmos ENT: moist mucous membranes, no thyromegaly, no cervical lymphadenopathy Cardiovascular: RRR, No MRG Respiratory: CTA B Gastrointestinal: abdomen soft, NT, ND, BS+ Musculoskeletal: no deformities, strength intact in all 4 Skin: moist, warm, no rashes Neurological: no tremor with outstretched hands  ASSESSMENT: 1. DM2, insulin-dependent, uncontrolled, without complications  PLAN:  1. Patient with a 5 year h/o DM2 (exacerbated by steroid use for MS), recently less well controlled after stopping Invokana -  I suggested to add Glipizide XL as her sugars have a staircase effect - sugars higher as the day goes  by, a sign of not enough mealtime coverage:  Patient Instructions  Please continue: - Metformin ER but change to 1000 mg 2x a day - Levemir 40 units at night  - Victoza 1.8 mg daily in am Add Glipizide XL 10 mg daily in am.  Please return in 1.5 month with your sugar log.   Please stop at the lab.   - will check Lipids and HbA1c today, she is not sure if a CMP was checked by her urologist >> will look and send me  the results - will need mealtime insulin if starts steroids for MS again - continue checking sugars at different times of the day - check 2 times a day, rotating checks >> need log at next visit - she is up to date with yearly eye exams - given flu vaccine today - Return to clinic in 1.5 mo with sugar log   Office Visit on 11/02/2013  Component Date Value Ref Range Status  . Hemoglobin A1C 11/02/2013 9.3* 4.6 - 6.5 % Final   Glycemic Control Guidelines for People with Diabetes:Non Diabetic:  <6%Goal of Therapy: <7%Additional Action Suggested:  >8%   . Cholesterol 11/02/2013 179  0 - 200 mg/dL Final   ATP III Classification       Desirable:  < 200 mg/dL               Borderline High:  200 - 239 mg/dL          High:  > = 240 mg/dL  . Triglycerides 11/02/2013 222.0* 0.0 - 149.0 mg/dL Final   Normal:  <150 mg/dLBorderline High:  150 - 199 mg/dL  . HDL 11/02/2013 32.70* >39.00 mg/dL Final  . VLDL 11/02/2013 44.4* 0.0 - 40.0 mg/dL Final  . Total CHOL/HDL Ratio 11/02/2013 5   Final                  Men          Women1/2 Average Risk     3.4          3.3Average Risk          5.0          4.42X Average Risk          9.6          7.13X Average Risk          15.0          11.0                      . NonHDL 11/02/2013 146.30   Final   NOTE:  Non-HDL goal should be 30 mg/dL higher than patient's LDL goal (i.e. LDL goal of < 70 mg/dL, would have non-HDL goal of < 100 mg/dL)  . Direct LDL 11/02/2013 109.2   Final   Optimal:  <100 mg/dLNear or Above Optimal:  100-129 mg/dLBorderline High:  130-159 mg/dLHigh:  160-189 mg/dLVery High:  >190 mg/dL   HbA1c higher, as expected. See plan above.

## 2013-11-03 LAB — LIPID PANEL
Cholesterol: 179 mg/dL (ref 0–200)
HDL: 32.7 mg/dL — AB (ref >39.00)
NonHDL: 146.3
Total CHOL/HDL Ratio: 5
Triglycerides: 222 mg/dL — ABNORMAL HIGH (ref 0.0–149.0)
VLDL: 44.4 mg/dL — AB (ref 0.0–40.0)

## 2013-11-03 LAB — HEMOGLOBIN A1C: Hgb A1c MFr Bld: 9.3 % — ABNORMAL HIGH (ref 4.6–6.5)

## 2013-11-03 LAB — LDL CHOLESTEROL, DIRECT: LDL DIRECT: 109.2 mg/dL

## 2013-11-04 ENCOUNTER — Encounter: Payer: Self-pay | Admitting: *Deleted

## 2013-11-04 LAB — HM PAP SMEAR: HM Pap smear: NORMAL

## 2013-12-01 ENCOUNTER — Telehealth: Payer: Self-pay | Admitting: Internal Medicine

## 2013-12-01 MED ORDER — AMLODIPINE BESYLATE 10 MG PO TABS: 10.0000 mg | ORAL_TABLET | Freq: Every day | ORAL | Status: DC

## 2013-12-01 NOTE — Telephone Encounter (Signed)
Pt needs refill on amlodipine 10 mg # 30 [facility] and also rx [facility]lodip[facility]. Pt has an appt to est with dr hunter on 05-10-14

## 2013-12-01 NOTE — Telephone Encounter (Signed)
Medication refilled and sent to both pharmacies

## 2013-12-09 LAB — HM MAMMOGRAPHY: HM Mammogram: NORMAL

## 2013-12-14 ENCOUNTER — Ambulatory Visit: Payer: BC Managed Care – PPO | Admitting: Internal Medicine

## 2013-12-21 ENCOUNTER — Encounter: Payer: Self-pay | Admitting: Internal Medicine

## 2013-12-21 ENCOUNTER — Ambulatory Visit (INDEPENDENT_AMBULATORY_CARE_PROVIDER_SITE_OTHER): Payer: BC Managed Care – PPO | Admitting: Internal Medicine

## 2013-12-21 VITALS — BP 122/78 | HR 87 | Temp 98.2°F | Resp 12 | Wt 212.0 lb

## 2013-12-21 DIAGNOSIS — E1165 Type 2 diabetes mellitus with hyperglycemia: Principal | ICD-10-CM

## 2013-12-21 DIAGNOSIS — IMO0002 Reserved for concepts with insufficient information to code with codable children: Secondary | ICD-10-CM

## 2013-12-21 DIAGNOSIS — E1151 Type 2 diabetes mellitus with diabetic peripheral angiopathy without gangrene: Secondary | ICD-10-CM

## 2013-12-21 DIAGNOSIS — E1159 Type 2 diabetes mellitus with other circulatory complications: Secondary | ICD-10-CM

## 2013-12-21 MED ORDER — INSULIN PEN NEEDLE 32G X 4 MM MISC: Status: DC

## 2013-12-21 NOTE — Patient Instructions (Signed)
Please continue: - Metformin ER but change to 1000 mg 2x a day - Levemir 40 units at night  - Victoza 1.8 mg daily in am - Glipizide XL 10 mg daily in am.  Please return in 3 months with your sugar log.   Please stop at the lab.

## 2013-12-21 NOTE — Progress Notes (Signed)
Patient ID: Kristin Mathews, female   DOB: 1970-02-19, 43 y.o.   MRN: [id number]  HPI: Kristin Mathews is a 43 y.o.-year-old female, returning for f/u for DM2, dx 2009, insulin-dependent since 2009, uncontrolled, with complications. Last visit 1.5 mo.   Last hemoglobin A1c was: Lab Results  Component Value Date   HGBA1C 9.3* 11/02/2013   HGBA1C 7.1* 04/14/2013   HGBA1C 8.0* 11/12/2012   Pt is on a regimen of: - Metformin ER 2000 mg at bedtime - Levemir 40 units at night - Victoza 1.8 mg daily  - Glipizide XL 10 mg in am  - added 10/2013 She stopped Invokana 100 >> 300 >> had few yeast infections - stopped in 05/2013.  We stopped Humulin 70/30 25 units bid. Januvia - did not help.  Glumetza not covered by insurance.  Pt checks her sugars 3-4x a day (reviewed log) - sugars greatly improved after adding Glipizide: - am: 107-231 >> 110-120 (150) >> 111-155 >> 83-122 - 2h after b'fast: 197 >> n/c >> 127 >> 148 - lunch: 136-236 >> 150-200 >> 105-187, 271 >> 105-145, 157 - 2h after lunch: 189-257 >> n/c >> 240 >> n/c - dinner: 137-222 >> n/c >> 98, 148-220 >> 115-136 - 2h after dinner: 227-241 >> n/c  >> 217 - bedtime: 156-251>> 160-210 >> 147, 275-316 >> 177-219 No lows; she has hypoglycemia awareness at 50s.   Pt's meals are: - Breakfast: yoghurt + granola, cereals - Lunch: sandwich, sushi, [place] - Dinner: home cooked meal: stews, etc. - Snacks: 3-4  - no CKD, last BUN/creatinine:  Lab Results  Component Value Date   BUN 13 12/19/2012   CREATININE 0.90 12/19/2012  Was on benazepril. Angioedema from ACEI 12/19/2012! - last set of lipids: Lab Results  Component Value Date   CHOL 179 11/02/2013   HDL 32.70* 11/02/2013   LDLCALC 105* 11/12/2012   LDLDIRECT 109.2 11/02/2013   TRIG 222.0* 11/02/2013   CHOLHDL 5 11/02/2013  Not on a statin.  - last eye exam was 04/10/2013.  No DR.  - mild numbness and tingling in her feet.  She also has a history of MS (neuro Dr Kristin Mathews)  - last 2 atacks: 02/2012, 2010; also HTN, borderline HL, fatty liver, endometriosis.  I reviewed pt's medications, allergies, PMH, social hx, family hx and no changes required, except as mentioned above. Started Tysabri - had 2 infusions.  ROS: Constitutional: + weight gain, no fatigue, no subjective hyperthermia Eyes: no blurry vision, no xerophthalmia ENT: no sore throat, no nodules palpated in throat, no dysphagia/odynophagia, no hoarseness Cardiovascular: no CP/SOB/palpitations/leg swelling Respiratory: no cough/SOB Gastrointestinal: no N/V/D/no C Musculoskeletal: no muscle aches/no joint aches Skin: no rashes, no hair loss Neurological: no tremors/numbness/tingling/dizziness  PE: BP 122/78 mmHg  Pulse 87  Temp(Src) 98.2 F (36.8 C) (Oral)  Resp 12  Wt 212 lb (96.163 kg)  SpO2 95% Wt Readings from Last 3 Encounters:  12/21/13 212 lb (96.163 kg)  11/02/13 201 lb 12.8 oz (91.536 kg)  04/30/13 204 lb (92.534 kg)   Constitutional: overweight, in NAD Eyes: PERRLA, EOMI, no exophthalmos ENT: moist mucous membranes, no thyromegaly, no cervical lymphadenopathy Cardiovascular: RRR, No MRG Respiratory: CTA B Gastrointestinal: abdomen soft, NT, ND, BS+ Musculoskeletal: no deformities, strength intact in all 4 Skin: moist, warm, no rashes Neurological: no tremor with outstretched hands  ASSESSMENT: 1. DM2, insulin-dependent, uncontrolled, without complications  PLAN:  1. Patient with a h/o DM2 (exacerbated by steroid use for MS), recently less well controlled  after stopping Invokana, but with much improved sugars after starting Glipizide.  -  I advised her to:  Patient Instructions  Please continue: - Metformin ER 1000 mg 2x a day - Levemir 40 units at night  - Victoza 1.8 mg daily in am Add Glipizide XL 10 mg daily in am.  Please return in 1.5 month with your sugar log.   Please stop at the lab.   - will check a CMP today - will need mealtime insulin if starts  steroids for MS again - continue checking sugars at different times of the day - check 2 times a day, rotating checks >> need log at next visit - she is up to date with yearly eye exams - given flu vaccine at last visit - Return to clinic in 3 mo with sugar log   Office Visit on 12/21/2013  Component Date Value Ref Range Status  . Sodium 12/21/2013 138  135 - 145 mEq/L Final  . Potassium 12/21/2013 4.7  3.5 - 5.1 mEq/L Final  . Chloride 12/21/2013 102  96 - 112 mEq/L Final  . CO2 12/21/2013 28  19 - 32 mEq/L Final  . Glucose, Bld 12/21/2013 227* 70 - 99 mg/dL Final  . BUN 12/21/2013 15  6 - 23 mg/dL Final  . Creatinine, Ser 12/21/2013 0.9  0.4 - 1.2 mg/dL Final  . Total Bilirubin 12/21/2013 0.2  0.2 - 1.2 mg/dL Final  . Alkaline Phosphatase 12/21/2013 76  39 - 117 U/L Final  . AST 12/21/2013 22  0 - 37 U/L Final  . ALT 12/21/2013 28  0 - 35 U/L Final  . Total Protein 12/21/2013 7.0  6.0 - 8.3 g/dL Final  . Albumin 12/21/2013 3.6  3.5 - 5.2 g/dL Final  . Calcium 12/21/2013 9.2  8.4 - 10.5 mg/dL Final  . GFR 12/21/2013 73.10  >60.00 mL/min Final   Glu higher then expected.

## 2013-12-22 LAB — COMPREHENSIVE METABOLIC PANEL
ALK PHOS: 76 U/L (ref 39–117)
ALT: 28 U/L (ref 0–35)
AST: 22 U/L (ref 0–37)
Albumin: 3.6 g/dL (ref 3.5–5.2)
BUN: 15 mg/dL (ref 6–23)
CO2: 28 meq/L (ref 19–32)
CREATININE: 0.9 mg/dL (ref 0.4–1.2)
Calcium: 9.2 mg/dL (ref 8.4–10.5)
Chloride: 102 mEq/L (ref 96–112)
GFR: 73.1 mL/min (ref >60.00)
GLUCOSE: 227 mg/dL — AB (ref 70–99)
Potassium: 4.7 mEq/L (ref 3.5–5.1)
SODIUM: 138 meq/L (ref 135–145)
Total Bilirubin: 0.2 mg/dL (ref 0.2–1.2)
Total Protein: 7 g/dL (ref 6.0–8.3)

## 2014-01-02 ENCOUNTER — Other Ambulatory Visit: Payer: Self-pay | Admitting: Internal Medicine

## 2014-01-25 ENCOUNTER — Telehealth: Payer: Self-pay | Admitting: Internal Medicine

## 2014-01-25 MED ORDER — CARVEDILOL 6.25 MG PO TABS: ORAL_TABLET | ORAL | Status: DC

## 2014-01-25 NOTE — Telephone Encounter (Signed)
Refills OK. 

## 2014-01-25 NOTE — Telephone Encounter (Signed)
I can't send this in under Dr Leanne Chang name since he has not seen her since 2013.  She seen you on 04/2013.  Can you approve this till she sees [place]?

## 2014-01-25 NOTE — Telephone Encounter (Signed)
Pt has an appt with dr hunter on 05/11/14. Pt needs new rx carvedilol 6.25 mg #30 with [facility]

## 2014-01-25 NOTE — Telephone Encounter (Signed)
rx sent in electronically 

## 2014-02-06 ENCOUNTER — Other Ambulatory Visit: Payer: Self-pay | Admitting: Family Medicine

## 2014-02-12 ENCOUNTER — Telehealth: Payer: Self-pay | Admitting: *Deleted

## 2014-02-12 NOTE — Telephone Encounter (Signed)
Patient was suppose to have infusion last week at [facility] but they told patient they did not have any meds. Patient is calling because she is going on week 6 and they still do not have meds. Please call patient and advise.

## 2014-02-12 NOTE — Telephone Encounter (Signed)
Spoke with Kristin Mathews--it has been 6 weeks since last Tysabri infusion. She denies worsening or new sx.  Appt moved to 02-15-14 at 1130 in order to expedite Tysabri infusion as much as possible./fim

## 2014-02-14 ENCOUNTER — Other Ambulatory Visit: Payer: Self-pay | Admitting: Internal Medicine

## 2014-02-15 ENCOUNTER — Ambulatory Visit (INDEPENDENT_AMBULATORY_CARE_PROVIDER_SITE_OTHER): Payer: BLUE CROSS/BLUE SHIELD | Admitting: Neurology

## 2014-02-15 ENCOUNTER — Encounter: Payer: Self-pay | Admitting: Neurology

## 2014-02-15 ENCOUNTER — Other Ambulatory Visit: Payer: Self-pay | Admitting: Family Medicine

## 2014-02-15 VITALS — BP 150/82 | HR 84 | Resp 14 | Ht 67.0 in | Wt 218.4 lb

## 2014-02-15 DIAGNOSIS — R202 Paresthesia of skin: Secondary | ICD-10-CM

## 2014-02-15 DIAGNOSIS — R5382 Chronic fatigue, unspecified: Secondary | ICD-10-CM

## 2014-02-15 DIAGNOSIS — R200 Anesthesia of skin: Secondary | ICD-10-CM

## 2014-02-15 DIAGNOSIS — R3915 Urgency of urination: Secondary | ICD-10-CM

## 2014-02-15 DIAGNOSIS — R260 Ataxic gait: Secondary | ICD-10-CM

## 2014-02-15 DIAGNOSIS — G35 Multiple sclerosis: Secondary | ICD-10-CM

## 2014-02-15 DIAGNOSIS — E1142 Type 2 diabetes mellitus with diabetic polyneuropathy: Secondary | ICD-10-CM

## 2014-02-15 MED ORDER — MODAFINIL 200 MG PO TABS: 200.0000 mg | ORAL_TABLET | Freq: Every day | ORAL | Status: DC | PRN

## 2014-02-15 NOTE — Progress Notes (Signed)
[facility]  PATIENT: Kristin Mathews DOB: 12/29/1970  REFERRING CLINICIAN: Dr. Leanne Chang  HISTORY FROM: Patient REASON FOR VISIT: MS   HISTORICAL  CHIEF COMPLAINT:  Chief Complaint  Patient presents with  . Multiple Sclerosis    Last Tysabri approx 01-01-14.   Has c/o some vertigo 1st thing in the morning, for the last 2 days only--vertigo resolves as the day goes on./fim    HISTORY OF PRESENT ILLNESS:  Kristin Mathews is a 44 yo woman who presented with unilateral numbness in May 2001 and was diagnosed with transverse myelitis.    MRI only showed one spot and an LP was non-diagnostic.   She had another episode of numbness 6 months later and MRI showed several newer lesions.    She was placed on REbif as part of the Rebiject study.     She had an exacerbation in 2007 and in 2010 with numbness and then optic neuritis.   In 2015, she had another numbness exacerbation and we decided to switch to Tysabri.   She has had 3 doses, last dose was 12/1/8/205.    Insurance Nurse, mental health) will not cover Tysabri in a hospital outpt center and we are trying to find out how best to continue her infusions.     She tolerates Tysabri well.  Currently, she is doing well with mild tingling and some fatigue.   Her tingling is mostly in the toes and bottoms of her feet.    Of note, she also has NIDDM, diagnosed in 2007.     She denies fixed numbness or tingling in her hands or face.     She denies any weakness but has spasticity in her legs when she wakes up in the mornings.   Also has some with sitting a long time.    She denies any significant gait issue.     Fatigue is daily.  Her fatigue ois more physical than cognitive and she gets some benefit from Provigil.   She tolerates it well.  She gets some bladder urgency and rare (q 6 months) incontinence.   Usually, she knows she has to go but has only a minute warning.  Mood is doing ok .   She denies significant depression or anxiety but has stress at  work.    Cognitively, she is doing well.   REVIEW OF SYSTEMS:  Constitutional: No fevers, chills, sweats, or change in appetite.   Has fatigue Eyes: No visual changes, double vision, eye pain Ear, nose and throat: No hearing loss, ear pain, nasal congestion, sore throat Cardiovascular: No chest pain, palpitations Respiratory:  No shortness of breath at rest or with exertion.   No wheezes GastrointestinaI: No nausea, vomiting, diarrhea, abdominal pain, fecal incontinence Genitourinary:  No dysuria, urinary retention or frequency.  No nocturia.   She reports urgency with rare incontinence Musculoskeletal:  No neck pain, back pain Integumentary: No rash, pruritus, skin lesions Neurological: as above Psychiatric: No depression at this time.  No anxiety Endocrine: No palpitations, diaphoresis, change in appetite, change in weigh or increased thirst Hematologic/Lymphatic:  No anemia, purpura, petechiae. Allergic/Immunologic: No itchy/runny eyes, nasal congestion, recent allergic reactions, rashes  ALLERGIES: Allergies  Allergen Reactions  . Ace Inhibitors Swelling    HOME MEDICATIONS: Outpatient Prescriptions Prior to Visit  Medication Sig Dispense Refill  . amLODipine (NORVASC) 10 MG tablet Take 1 tablet (10 mg total) by mouth daily. 30 tablet 0  . carvedilol (COREG) 6.25 MG tablet TAKE 1 TABLET TWICE A  DAY WITH A MEAL 30 tablet 4  . cetirizine (ZYRTEC) 10 MG tablet Take 10 mg by mouth daily.    . cholecalciferol (VITAMIN D) 1000 UNITS tablet Take 1,000 Units by mouth daily.    . DULoxetine (CYMBALTA) 60 MG capsule Take 1 capsule (60 mg total) by mouth daily. 90 capsule 3  . fluconazole (DIFLUCAN) 150 MG tablet Take 1 tablet (150 mg total) by mouth once. 2 tablet 0  . glipiZIDE (GLUCOTROL XL) 10 MG 24 hr tablet TAKE 1 TABLET (10 MG TOTAL) BY MOUTH DAILY WITH BREAKFAST. 30 tablet 2  . glucose blood test strip Test blood sugar 2 times a day, vary times as instructed. 200 each 2  .  Insulin Detemir (LEVEMIR FLEXTOUCH) 100 UNIT/ML Pen Inject 30 units into the skin at bedtime. 30 mL 3  . Insulin Pen Needle (BD PEN NEEDLE NANO U/F) 32G X 4 MM MISC Use 2x a day 200 each 3  . Lactobacillus Rhamnosus, GG, (CULTURELLE) CAPS Take 2 capsules by mouth daily. Taking daily to get ready for an [place] trip out of country    . metformin (FORTAMET) 1000 MG (OSM) 24 hr tablet Take 1,000 mg by mouth 2 (two) times daily with a meal.    . modafinil (PROVIGIL) 200 MG tablet Take 200 mg by mouth daily as needed.     Marland Kitchen amLODipine (NORVASC) 10 MG tablet TAKE 1 TABLET DAILY 90 tablet 0  . insulin detemir (LEVEMIR) 100 UNIT/ML injection Inject 30 Units into the skin at bedtime.    . interferon beta-1a (REBIF) 44 MCG/0.5ML injection Inject 22 mcg into the skin 3 (three) times a week. S-T-Th as directed 1/2 at present    . levonorgestrel-ethinyl estradiol (LYBREL) 90-20 MCG per tablet Take 1 tablet by mouth daily.      . Liraglutide 18 MG/3ML SOPN Inject 1.8 mg in the AM 9 pen 3  . LORazepam (ATIVAN) 1 MG tablet Take 1 tablet (1 mg total) by mouth every 8 (eight) hours as needed for anxiety. 15 tablet 0   No facility-administered medications prior to visit.    PAST MEDICAL HISTORY: Past Medical History  Diagnosis Date  . Diabetes mellitus     Type 2  . Hypertension   . Multiple sclerosis   . Endometriosis   . Depression   . Type II or unspecified type diabetes mellitus with peripheral circulatory disorders, uncontrolled(250.72) 11/27/2012    PAST SURGICAL HISTORY: Past Surgical History  Procedure Laterality Date  . Lumbar disc surgery      LS spine  . Wisdom tooth extraction    . Exploratory laparotomy      FAMILY HISTORY: Family History  Problem Relation Age of Onset  . Cancer Mother     Breast  . Diabetes Mother   . Diabetes Father   . Other Father     progressive supranuclear palsy    SOCIAL HISTORY:  History   Social History  . Marital Status: Single    Spouse  Name: N/A    Number of Children: N/A  . Years of Education: N/A   Occupational History  . Not on file.   Social History Main Topics  . Smoking status: Never Smoker   . Smokeless tobacco: Not on file  . Alcohol Use: 0.0 oz/week    0 Not specified per week     Comment: occasionl  . Drug Use: No  . Sexual Activity: Not Currently   Other Topics Concern  . 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   General: The patient is well-developed and well-nourished and in no acute distress  Eyes:  Funduscopic exam shows normal optic discs and retinal vessels.  Neck: The neck is supple, no carotid bruits are noted.  The neck is nontender.  Respiratory: The respiratory examination is clear.  Cardiovascular: The cardiovascular examination reveals a regular rate and rhythm, no murmurs, gallops or rubs are noted.  Skin: Extremities are without significant edema.  Neurologic Exam  Mental status: The patient is alert and oriented x 3 at the time of the examination. The patient has apparent normal recent and remote memory, with an apparently normal attention span and concentration ability.   Speech is normal.  Cranial nerves: Extraocular movements are full. Pupils are equal, round, and reactive to light and accomodation.  Visual fields are full.  Facial symmetry is present. There is good facial sensation to soft touch bilaterally.Facial strength is normal.  Trapezius and sternocleidomastoid strength is normal. No dysarthria is noted.  The tongue is midline, and the patient has symmetric elevation of the soft palate. No obvious hearing deficits are noted.  Motor:  Muscle bulk and tone are normal.  Strength is  5 / 5 in all 4 extremities.   Sensory: Sensory testing is intact to pinprick, soft touch, vibration sensation, and position sense in her hands but she reports decreased vibration sensation in both feet, predominantly at the toes   Coordination: Cerebellar testing reveals good finger-nose-finger and heel-to-shin bilaterally.  Gait and station: Station and gait are normal. Tandem gait is wide. Romberg is negative.   Reflexes: Deep tendon reflexes are symmetric and normal bilaterally. Plantar responses are normal.    DIAGNOSTIC DATA (LABS, IMAGING, TESTING) - I reviewed patient records, labs, notes, testing and imaging myself where available.  Lab Results  Component Value Date   WBC 8.0 12/19/2012   HGB 15.5* 12/19/2012   HCT 44.8 12/19/2012   MCV 86.8 12/19/2012   PLT 412* 12/19/2012      Component Value Date/Time   NA 138 12/21/2013 1705   K 4.7 12/21/2013 1705   CL 102 12/21/2013 1705   CO2 28 12/21/2013 1705   GLUCOSE 227* 12/21/2013 1705   BUN 15 12/21/2013 1705   CREATININE 0.9 12/21/2013 1705   CALCIUM 9.2 12/21/2013 1705   PROT 7.0 12/21/2013 1705   ALBUMIN 3.6 12/21/2013 1705   AST 22 12/21/2013 1705   ALT 28 12/21/2013 1705   ALKPHOS 76 12/21/2013 1705   BILITOT 0.2 12/21/2013 1705   GFRNONAA 77* 12/19/2012 2113   GFRAA 89* 12/19/2012 2113   Lab Results  Component Value Date   CHOL 179 11/02/2013   HDL 32.70* 11/02/2013   LDLCALC 105* 11/12/2012   LDLDIRECT 109.2 11/02/2013   TRIG 222.0* 11/02/2013   CHOLHDL 5 11/02/2013   Lab Results  Component Value Date   HGBA1C 9.3* 11/02/2013   No results found for: VITAMINB12 Lab Results  Component Value Date   TSH 2.77 11/12/2012   No components found for: VITAMIND     ASSESSMENT AND PLAN  Multiple sclerosis  Diabetic polyneuropathy associated with  type 2 diabetes mellitus  Numbness and tingling of both legs below knees  Chronic fatigue  Urinary urgency  Ataxic gait   In  summary, Kristin Mathews, is a 44 year old woman with multiple sclerosis for the past 15 years who had been suboptimally treated on Rebif with several exacerbations. She recently switched to Tysabri therapy. She is JCV antibody negative and wishes to continue. With the new practice location, we are having some difficulty getting her arthritis for infusion and will look at the options available. If I cannot get her to an infusion center within a couple weeks (she is already two weeks late) I will have her use some of her remaining Rebif while we continue to try to get her into an approved site.   She has some numbness in her toes, predominantly to vibration sensation. This is as likely to be due to a mild diabetic polyneuropathy as to MS. However, her mild gait ataxia is more likely due to the MS.  She will return to see [place] in 4 months as scheduled or sooner if we begin doing infusions in the office. She is to call earlier if she has new or worsening neurologic symptoms.   Kristin Mathews A. Felecia Shelling, MD, PhD 02/16/2977, 89:21 PM Certified in Neurology, Clinical Neurophysiology, Sleep Medicine, Pain Medicine and Neuroimaging  [facility] [address], [place], [place] [address] [phone]

## 2014-02-18 ENCOUNTER — Ambulatory Visit: Payer: Self-pay | Admitting: Neurology

## 2014-03-02 ENCOUNTER — Telehealth: Payer: Self-pay | Admitting: Neurology

## 2014-03-02 NOTE — Telephone Encounter (Signed)
LMTC./fim 

## 2014-03-02 NOTE — Telephone Encounter (Signed)
Patient is calling to check of status of infusion lab.  She would like to know if there is any way she can get a prescription called in for Tysabri and have it administered here.  Please call.

## 2014-03-03 NOTE — Telephone Encounter (Signed)
Patient returning [place], RN's call.

## 2014-03-04 NOTE — Telephone Encounter (Signed)
LMOM to call./fim

## 2014-03-05 ENCOUNTER — Other Ambulatory Visit: Payer: Self-pay | Admitting: Family Medicine

## 2014-03-08 NOTE — Telephone Encounter (Signed)
Will wait to hear back from pt./fim

## 2014-03-11 ENCOUNTER — Telehealth: Payer: Self-pay | Admitting: Neurology

## 2014-03-11 NOTE — Telephone Encounter (Signed)
LMOM for Kristin Mathews that pt. will be having Tysabri infusions at [facility] w/i the next 1-2 weeks/fim

## 2014-03-11 NOTE — Telephone Encounter (Signed)
[place] with [employer] @ [facility], questioning where will patient have infusions.  Please call and advise.

## 2014-03-12 NOTE — Telephone Encounter (Signed)
Attempted to contact Kristin Mathews to get update on MS sx.  VM is full/fim

## 2014-03-15 ENCOUNTER — Telehealth: Payer: Self-pay | Admitting: *Deleted

## 2014-03-15 ENCOUNTER — Encounter: Payer: Self-pay | Admitting: *Deleted

## 2014-03-15 NOTE — Telephone Encounter (Signed)
Patient wants to information about the infusion lab and wants to know when can she be set up.

## 2014-03-15 NOTE — Telephone Encounter (Signed)
Duplicate task/fim

## 2014-03-15 NOTE — Telephone Encounter (Signed)
LMTC./fim 

## 2014-03-16 NOTE — Telephone Encounter (Signed)
LMOM for Kristin Mathews--she does not need to return my call--just wanted her to know that she should be receiving a call over the next few business days to set up Tysabri infusion./fim

## 2014-03-22 ENCOUNTER — Ambulatory Visit: Payer: BC Managed Care – PPO | Admitting: Internal Medicine

## 2014-04-09 ENCOUNTER — Other Ambulatory Visit: Payer: Self-pay | Admitting: Family Medicine

## 2014-04-15 ENCOUNTER — Other Ambulatory Visit: Payer: Self-pay | Admitting: *Deleted

## 2014-04-15 MED ORDER — CARVEDILOL 6.25 MG PO TABS: ORAL_TABLET | ORAL | Status: DC

## 2014-04-19 ENCOUNTER — Encounter: Payer: Self-pay | Admitting: *Deleted

## 2014-04-20 ENCOUNTER — Emergency Department (HOSPITAL_COMMUNITY)
Admission: EM | Admit: 2014-04-20 | Discharge: 2014-04-20 | Disposition: A | Discharge status: Home / Self Care | Payer: BLUE CROSS/BLUE SHIELD | Attending: Emergency Medicine | Admitting: Emergency Medicine

## 2014-04-20 ENCOUNTER — Emergency Department (HOSPITAL_COMMUNITY): Payer: BLUE CROSS/BLUE SHIELD

## 2014-04-20 ENCOUNTER — Encounter (HOSPITAL_COMMUNITY): Payer: Self-pay | Admitting: Emergency Medicine

## 2014-04-20 DIAGNOSIS — Y9289 Other specified places as the place of occurrence of the external cause: Secondary | ICD-10-CM | POA: Insufficient documentation

## 2014-04-20 DIAGNOSIS — S0990XA Unspecified injury of head, initial encounter: Secondary | ICD-10-CM | POA: Diagnosis present

## 2014-04-20 DIAGNOSIS — Z8742 Personal history of other diseases of the female genital tract: Secondary | ICD-10-CM | POA: Insufficient documentation

## 2014-04-20 DIAGNOSIS — W07XXXA Fall from chair, initial encounter: Secondary | ICD-10-CM | POA: Insufficient documentation

## 2014-04-20 DIAGNOSIS — G35 Multiple sclerosis: Secondary | ICD-10-CM | POA: Diagnosis not present

## 2014-04-20 DIAGNOSIS — Y92009 Unspecified place in unspecified non-institutional (private) residence as the place of occurrence of the external cause: Secondary | ICD-10-CM

## 2014-04-20 DIAGNOSIS — I10 Essential (primary) hypertension: Secondary | ICD-10-CM | POA: Insufficient documentation

## 2014-04-20 DIAGNOSIS — Z793 Long term (current) use of hormonal contraceptives: Secondary | ICD-10-CM | POA: Diagnosis not present

## 2014-04-20 DIAGNOSIS — Y9389 Activity, other specified: Secondary | ICD-10-CM | POA: Insufficient documentation

## 2014-04-20 DIAGNOSIS — Y998 Other external cause status: Secondary | ICD-10-CM | POA: Diagnosis not present

## 2014-04-20 DIAGNOSIS — S0101XA Laceration without foreign body of scalp, initial encounter: Secondary | ICD-10-CM

## 2014-04-20 DIAGNOSIS — Z79899 Other long term (current) drug therapy: Secondary | ICD-10-CM | POA: Insufficient documentation

## 2014-04-20 DIAGNOSIS — E1159 Type 2 diabetes mellitus with other circulatory complications: Secondary | ICD-10-CM | POA: Diagnosis not present

## 2014-04-20 DIAGNOSIS — Z794 Long term (current) use of insulin: Secondary | ICD-10-CM | POA: Diagnosis not present

## 2014-04-20 DIAGNOSIS — F329 Major depressive disorder, single episode, unspecified: Secondary | ICD-10-CM | POA: Diagnosis not present

## 2014-04-20 DIAGNOSIS — W01198A Fall on same level from slipping, tripping and stumbling with subsequent striking against other object, initial encounter: Secondary | ICD-10-CM | POA: Insufficient documentation

## 2014-04-20 DIAGNOSIS — W19XXXA Unspecified fall, initial encounter: Secondary | ICD-10-CM

## 2014-04-20 MED ORDER — ACETAMINOPHEN 500 MG PO TABS
1000.0000 mg | ORAL_TABLET | Freq: Once | ORAL | Status: DC
  Filled 2014-04-20: qty 2

## 2014-04-20 MED ORDER — LIDOCAINE-EPINEPHRINE (PF) 2 %-1:200000 IJ SOLN
20.0000 mL | Freq: Once | INTRAMUSCULAR | Status: AC
  Administered 2014-04-20: 20 mL
  Filled 2014-04-20: qty 20

## 2014-04-20 NOTE — ED Notes (Signed)
Pt was changing the smoke detector at her house and fell off of a chair, striking her head on a dresser. No loc. Bleeding controlled at this time. Ambulatory. Hypertensive. Has not taken nighttime meds yet.

## 2014-04-20 NOTE — ED Notes (Signed)
Bed: WA03 Expected date:  Expected time:  Means of arrival:  Comments: EMS 44 yo female fall/hit back of head on corner of dresser/HTN 182/100

## 2014-04-20 NOTE — ED Provider Notes (Signed)
CSN: [id number]     Arrival date & time 04/20/14  0012 History   First MD Initiated Contact with Patient 04/20/14 0015     Chief Complaint  Patient presents with  . Head Injury     (Consider location/radiation/quality/duration/timing/severity/associated sxs/prior Treatment) HPI  Patient reports she was changing the battery in her smoke detector by standing up on a chair. She states she has MS and sometimes her legs are weak and she either lost balance or her legs gave way and she fell backwards off the chair and hit her right head on a dresser. She denies loss of consciousness. She states she does have a headache but denies neck pain. She denies any other injury including chest, abdominal pain, or other extremity pain. She denies nausea, vomiting, blurred vision, any numbness or to move her extremities. Her last tetanus is less than 10 years ago.  PCP Dr  Leanne Chang  Past Medical History  Diagnosis Date  . Diabetes mellitus     Type 2  . Hypertension   . Multiple sclerosis   . Endometriosis   . Depression   . Type II or unspecified type diabetes mellitus with peripheral circulatory disorders, uncontrolled(250.72) 11/27/2012   Past Surgical History  Procedure Laterality Date  . Lumbar disc surgery      LS spine  . Wisdom tooth extraction    . Exploratory laparotomy     Family History  Problem Relation Age of Onset  . Cancer Mother     Breast  . Diabetes Mother   . Diabetes Father   . Other Father     progressive supranuclear palsy   History  Substance Use Topics  . Smoking status: Never Smoker   . Smokeless tobacco: Not on file  . Alcohol Use: 0.0 oz/week    0 Standard drinks or equivalent per week     Comment: occasionl   Lives at home Lives alone employed  OB History    No data available     Review of Systems  All other systems reviewed and are negative.     Allergies  Ace inhibitors  Home Medications   Prior to Admission medications   Medication Sig  Start Date End Date Taking? Authorizing Provider  amLODipine (NORVASC) 10 MG tablet Take 1 tablet (10 mg total) by mouth daily. 12/01/13  Yes Marin Olp, MD  carvedilol (COREG) 6.25 MG tablet TAKE 1 TABLET BY MOUTH TWICE DAILY WITH A MEAL 04/15/14  Yes Britt Bottom, MD  DULoxetine (CYMBALTA) 60 MG capsule Take 1 capsule (60 mg total) by mouth daily. 04/30/13  Yes Eulas Post, MD  glipiZIDE (GLUCOTROL XL) 10 MG 24 hr tablet TAKE 1 TABLET (10 MG TOTAL) BY MOUTH DAILY WITH BREAKFAST. 02/14/14  Yes [name], MD  glucose blood test strip Test blood sugar 2 times a day, vary times as instructed. 04/01/13  Yes [name], MD  Insulin Detemir (LEVEMIR FLEXTOUCH) 100 UNIT/ML Pen Inject 30 units into the skin at bedtime. Patient taking differently: Inject 42 Units into the skin at bedtime. Inject 30 units into the skin at bedtime. 04/14/13  Yes [name], MD  Insulin Pen Needle (BD PEN NEEDLE NANO U/F) 32G X 4 MM MISC Use 2x a day 12/21/13  Yes [name], MD  levonorgestrel-ethinyl estradiol (LYBREL,AMETHYST) 90-20 MCG tablet Take 1 tablet by mouth daily.    Yes Historical Provider, MD  Liraglutide 18 MG/3ML SOPN Inject 1.8 mLs into the skin daily.    Yes  Historical Provider, MD  metformin (FORTAMET) 1000 MG (OSM) 24 hr tablet Take 1,000 mg by mouth 2 (two) times daily with a meal. 04/14/13  Yes [name], MD  modafinil (PROVIGIL) 200 MG tablet Take 1 tablet (200 mg total) by mouth daily as needed. Patient taking differently: Take 200 mg by mouth daily.  02/15/14  Yes Britt Bottom, MD  natalizumab (TYSABRI) 300 MG/15ML injection Inject 300 mg into the vein every 30 (thirty) days.   Yes Historical Provider, MD  cetirizine (ZYRTEC) 10 MG tablet Take 10 mg by mouth every evening.     Historical Provider, MD  fluconazole (DIFLUCAN) 150 MG tablet Take 1 tablet (150 mg total) by mouth once. Patient not taking: Reported on 04/20/2014 04/14/13   [name], MD  Naproxen  Sodium 220 MG CAPS Take 440 mg by mouth 2 (two) times daily as needed (for pain.).     Historical Provider, MD   BP 167/98 mmHg  Pulse 91  Temp(Src) 98.5 F (36.9 C) (Oral)  Resp 16  SpO2 96%  Vital signs normal   Physical Exam  Constitutional: She is oriented to person, place, and time. She appears well-developed and well-nourished.  Non-toxic appearance. She does not appear ill. No distress.  HENT:  Head: Normocephalic.  Right Ear: External ear normal.  Left Ear: External ear normal.  Nose: Nose normal. No mucosal edema or rhinorrhea.  Mouth/Throat: Oropharynx is clear and moist and mucous membranes are normal. No dental abscesses or uvula swelling.  Patient has a lot of dried blood in her hair. She appears to have a laceration of her right scalp.  Eyes: Conjunctivae and EOM are normal. Pupils are equal, round, and reactive to light.  Neck: Normal range of motion and full passive range of motion without pain. Neck supple.  Cardiovascular: Normal rate, regular rhythm and normal heart sounds.  Exam reveals no gallop and no friction rub.   No murmur heard. Pulmonary/Chest: Effort normal and breath sounds normal. No respiratory distress. She has no wheezes. She has no rhonchi. She has no rales. She exhibits no tenderness and no crepitus.  Abdominal: Soft. Normal appearance and bowel sounds are normal. She exhibits no distension. There is no tenderness. There is no rebound and no guarding.  Musculoskeletal: Normal range of motion. She exhibits no edema or tenderness.  Moves all extremities well.   Neurological: She is alert and oriented to person, place, and time. She has normal strength. No cranial nerve deficit.  Skin: Skin is warm, dry and intact. No rash noted. No erythema. No pallor.  Psychiatric: She has a normal mood and affect. Her speech is normal and behavior is normal. Her mood appears not anxious.  Nursing note and vitals reviewed.   ED Course  Procedures (including  critical care time)  Medications  lidocaine-EPINEPHrine (XYLOCAINE W/EPI) 2 %-1:200000 (PF) injection 20 mL (not administered)  acetaminophen (TYLENOL) tablet 1,000 mg (not administered)    LACERATION REPAIR Performed by: Rolland Porter L Authorized by: Janice Norrie Consent: Verbal consent obtained. Risks and benefits: risks, benefits and alternatives were discussed Consent given by: patient Patient identity confirmed: provided demographic data Prepped and Draped in normal sterile fashion Wound explored  Laceration Location: right scalp  Laceration Length: 3.5 cm  No Foreign Bodies seen or palpated  Anesthesia: local infiltration  Local anesthetic: lidocaine 1%+ 1%  epinephrine  Anesthetic total: 12 ml  Irrigation method: syringe Amount of cleaning: standard  Skin closure: staples   Number of staples #8  Patient tolerance: Patient tolerated the procedure well with no immediate complications.   Labs Review Labs Reviewed - No data to display  Imaging Review Ct Head Wo Contrast  04/20/2014   CLINICAL DATA:  [place] off of a chair wall changing battery in the smoke detector. Struck head on a dresser with laceration to the posterior parietal area. No loss of consciousness. History of multiple sclerosis.  EXAM: CT HEAD WITHOUT CONTRAST  TECHNIQUE: Contiguous axial images were obtained from the base of the skull through the vertex without intravenous contrast.  COMPARISON:  CT sinuses 03/04/2007.  FINDINGS: Ventricles and sulci appear symmetrical. No mass effect or midline shift. No abnormal extra-axial fluid collections. Gray-white matter junctions are distinct. Basal cisterns are not effaced. No evidence of acute intracranial hemorrhage. No depressed skull fractures. Small retention cysts in the right maxillary antrum. Mastoid air cells are not opacified. Subcutaneous scalp laceration over the right posterior parietal region.  IMPRESSION: No acute intracranial abnormalities.    Electronically Signed   By: Lucienne Capers M.D.   On: 04/20/2014 01:50     EKG Interpretation None      MDM   Final diagnoses:  Fall at home, initial encounter  Laceration of scalp, initial encounter    Plan discharge  Rolland Porter, MD, Barbette Or, MD 04/20/14 [phone]

## 2014-04-20 NOTE — Discharge Instructions (Signed)
Ice packs over the tender area for comfort. Take acetaminophen 1000 mg 4 times a day for pain. You need to have someone call you about every 2 hours to make sure you are okay the first 24 hrs. Return to the ED for any problems listed on the head injury sheet.

## 2014-04-21 ENCOUNTER — Telehealth: Payer: Self-pay | Admitting: Family Medicine

## 2014-04-21 NOTE — Telephone Encounter (Signed)
Pt need to have staples remove from her head from a fall. May I use SDA 1:45  4/12 /16

## 2014-04-21 NOTE — Telephone Encounter (Signed)
Ok to schedule.

## 2014-04-22 NOTE — Telephone Encounter (Signed)
yes

## 2014-04-22 NOTE — Telephone Encounter (Signed)
Pt has been scheduled.  °

## 2014-04-27 ENCOUNTER — Encounter: Payer: Self-pay | Admitting: Family Medicine

## 2014-04-27 ENCOUNTER — Ambulatory Visit (INDEPENDENT_AMBULATORY_CARE_PROVIDER_SITE_OTHER): Payer: BLUE CROSS/BLUE SHIELD | Admitting: Family Medicine

## 2014-04-27 VITALS — BP 112/80 | HR 91 | Temp 98.2°F | Wt 220.0 lb

## 2014-04-27 DIAGNOSIS — I10 Essential (primary) hypertension: Secondary | ICD-10-CM | POA: Diagnosis not present

## 2014-04-27 DIAGNOSIS — G44309 Post-traumatic headache, unspecified, not intractable: Secondary | ICD-10-CM | POA: Diagnosis not present

## 2014-04-27 DIAGNOSIS — M545 Low back pain, unspecified: Secondary | ICD-10-CM | POA: Insufficient documentation

## 2014-04-27 DIAGNOSIS — J309 Allergic rhinitis, unspecified: Secondary | ICD-10-CM | POA: Insufficient documentation

## 2014-04-27 DIAGNOSIS — Z4802 Encounter for removal of sutures: Secondary | ICD-10-CM | POA: Diagnosis not present

## 2014-04-27 MED ORDER — AMLODIPINE BESYLATE 10 MG PO TABS: 10.0000 mg | ORAL_TABLET | Freq: Every day | ORAL | Status: DC

## 2014-04-27 MED ORDER — CARVEDILOL 6.25 MG PO TABS: ORAL_TABLET | ORAL | Status: DC

## 2014-04-27 NOTE — Progress Notes (Signed)
Garret Reddish, MD Phone: [phone]  Subjective:   Kristin Mathews is a 44 y.o. year old very pleasant female patient who presents with the following:  Headache -Patient had a fall off of a chair when trying to do some work at home about a week ago. She has MS and admits her legs got weak on her with the fall (not atypical occurrence). She realizes she should not have been doing what she was doing. She went to ER with laceration and had 8 staples placed and requests removal today. She has some aching of her head near the staples but otherwise feels fine ROS- no blurry vision or diffuse headache-all local near staples  Hypertension-excellent control  BP Readings from Last 3 Encounters:  04/27/14 112/80  04/20/14 158/76  02/15/14 150/82   Home BP monitoring-no Compliant with medications-yes without side effects ROS-Denies any CP, HA, SOB, blurry vision.  Past Medical History- Patient Active Problem List   Diagnosis Date Noted  . Pure hypercholesterolemia 11/27/2012  . Uncontrolled type 2 diabetes mellitus with peripheral circulatory disorder 11/27/2012  . DEPRESSION 02/25/2008  . HYPERLIPIDEMIA NEC/NOS 08/29/2006  . DIABETES MELLITUS, TYPE II 07/05/2006  . MULTIPLE SCLEROSIS 07/05/2006  . HYPERTENSION 07/05/2006  . ENDOMETRIOSIS NOS 07/05/2006   Medications- reviewed and updated Current Outpatient Prescriptions  Medication Sig Dispense Refill  . amLODipine (NORVASC) 10 MG tablet Take 1 tablet (10 mg total) by mouth daily. 30 tablet 0  . carvedilol (COREG) 6.25 MG tablet TAKE 1 TABLET BY MOUTH TWICE DAILY WITH A MEAL 60 tablet 2  . cetirizine (ZYRTEC) 10 MG tablet Take 10 mg by mouth every evening.     . DULoxetine (CYMBALTA) 60 MG capsule Take 1 capsule (60 mg total) by mouth daily. 90 capsule 3  . glipiZIDE (GLUCOTROL XL) 10 MG 24 hr tablet TAKE 1 TABLET (10 MG TOTAL) BY MOUTH DAILY WITH BREAKFAST. 30 tablet 2  . glucose blood test strip Test blood sugar 2 times a day, vary  times as instructed. 200 each 2  . Insulin Detemir (LEVEMIR FLEXTOUCH) 100 UNIT/ML Pen Inject 30 units into the skin at bedtime. (Patient taking differently: Inject 42 Units into the skin at bedtime. Inject 30 units into the skin at bedtime.) 30 mL 3  . Insulin Pen Needle (BD PEN NEEDLE NANO U/F) 32G X 4 MM MISC Use 2x a day 200 each 3  . levonorgestrel-ethinyl estradiol (LYBREL,AMETHYST) 90-20 MCG tablet Take 1 tablet by mouth daily.     . Liraglutide 18 MG/3ML SOPN Inject 1.8 mLs into the skin daily.     . metformin (FORTAMET) 1000 MG (OSM) 24 hr tablet Take 1,000 mg by mouth 2 (two) times daily with a meal.    . Naproxen Sodium 220 MG CAPS Take 440 mg by mouth 2 (two) times daily as needed (for pain.).     Marland Kitchen natalizumab (TYSABRI) 300 MG/15ML injection Inject 300 mg into the vein every 30 (thirty) days.    . modafinil (PROVIGIL) 200 MG tablet Take 1 tablet (200 mg total) by mouth daily as needed. (Patient not taking: Reported on 04/27/2014) 30 tablet 5   Objective: BP 112/80 mmHg  Pulse 91  Temp(Src) 98.2 F (36.8 C)  Wt 220 lb (99.791 kg) Gen: NAD, resting comfortably R back scalp with 8 staples (each removed). Some brisk bleeding from one of them, stopped with direct pressure, area healing well otherwise with some scab formation CV: RRR no murmurs rubs or gallops Lungs: CTAB no crackles, wheeze, rhonchi  Abdomen: soft/nontender/nondistended/normal bowel sounds. No rebound or guarding.  Ext: no edema Skin: warm, dry, no rash   Assessment/Plan:  Essential hypertension Controlled on current rx so refilled Amlodipine 10mg , carvedilol 6.25mg  BID   Headache-all local from staples. After removal today x8, she felt some relief in her tension.   Follows up for CPE in next few weeks. Return precautions advised.   Meds ordered this encounter  Medications  . carvedilol (COREG) 6.25 MG tablet    Sig: TAKE 1 TABLET BY MOUTH TWICE DAILY WITH A MEAL    Dispense:  180 tablet    Refill:  3  .  amLODipine (NORVASC) 10 MG tablet    Sig: Take 1 tablet (10 mg total) by mouth daily.    Dispense:  90 tablet    Refill:  3

## 2014-04-27 NOTE — Assessment & Plan Note (Signed)
Controlled on current rx so refilled Amlodipine 10mg , carvedilol 6.25mg  BID

## 2014-04-27 NOTE — Patient Instructions (Signed)
8 staples removed. Wash hair just like you have been, scab may fall off  See you in a few weeks.   I would usually get CBC, CMET, Lipids, a1c, TSH as part of your physical. Either Dr. Cruzita Lederer can get these and forward to me or we can do at the time of your visit with me.

## 2014-04-30 ENCOUNTER — Encounter: Payer: Self-pay | Admitting: Internal Medicine

## 2014-04-30 ENCOUNTER — Ambulatory Visit (INDEPENDENT_AMBULATORY_CARE_PROVIDER_SITE_OTHER): Payer: Self-pay | Admitting: Internal Medicine

## 2014-04-30 VITALS — BP 114/68 | HR 98 | Temp 97.8°F | Resp 12 | Wt 219.0 lb

## 2014-04-30 DIAGNOSIS — IMO0002 Reserved for concepts with insufficient information to code with codable children: Secondary | ICD-10-CM

## 2014-04-30 DIAGNOSIS — E1151 Type 2 diabetes mellitus with diabetic peripheral angiopathy without gangrene: Secondary | ICD-10-CM

## 2014-04-30 DIAGNOSIS — E1165 Type 2 diabetes mellitus with hyperglycemia: Principal | ICD-10-CM

## 2014-04-30 DIAGNOSIS — E1159 Type 2 diabetes mellitus with other circulatory complications: Secondary | ICD-10-CM | POA: Diagnosis not present

## 2014-04-30 LAB — CBC
HCT: 41.6 % (ref 36.0–46.0)
HEMOGLOBIN: 14 g/dL (ref 12.0–15.0)
MCHC: 33.8 g/dL (ref 30.0–36.0)
MCV: 84.5 fl (ref 78.0–100.0)
PLATELETS: 370 10*3/uL (ref 150.0–400.0)
RBC: 4.92 Mil/uL (ref 3.87–5.11)
RDW: 14 % (ref 11.5–15.5)
WBC: 10 10*3/uL (ref 4.0–10.5)

## 2014-04-30 LAB — MICROALBUMIN / CREATININE URINE RATIO
CREATININE, U: 258 mg/dL
MICROALB/CREAT RATIO: 3.4 mg/g (ref 0.0–30.0)
Microalb, Ur: 8.9 mg/dL — ABNORMAL HIGH (ref 0.0–1.9)

## 2014-04-30 LAB — HEMOGLOBIN A1C: Hgb A1c MFr Bld: 8.6 % — ABNORMAL HIGH (ref 4.6–6.5)

## 2014-04-30 LAB — TSH: TSH: 2.25 u[IU]/mL (ref 0.35–4.50)

## 2014-04-30 MED ORDER — GLIPIZIDE ER 10 MG PO TB24: ORAL_TABLET | ORAL | Status: DC

## 2014-04-30 NOTE — Progress Notes (Signed)
Patient ID: Kristin Mathews, female   DOB: 01/09/1971, 44 y.o.   MRN: [id number]  HPI: Kristin Mathews is a 44 y.o.-year-old female, returning for f/u for DM2, dx 2009, insulin-dependent since 2009, uncontrolled, with complications. Last visit 4 mo.  She fell and had a head laceration 2 weeks ago.    Last hemoglobin A1c was: Lab Results  Component Value Date   HGBA1C 9.3* 11/02/2013   HGBA1C 7.1* 04/14/2013   HGBA1C 8.0* 11/12/2012   Pt is on a regimen of: - Metformin ER 1000 mg 2x a day - Levemir 40-42 units at night - Victoza 1.8 mg daily  - Glipizide XL 10 mg in am  - added 10/2013 She stopped Invokana 100 >> 300 >> had few yeast infections - stopped in 05/2013.  We stopped Humulin 70/30 25 units bid. Januvia - did not help.  Glumetza not covered by insurance.  Pt checks her sugars 0-1x a day (reviewed log - few checks!): - am: 107-231 >> 110-120 (150) >> 111-155 >> 83-122 >> 104-145 - 2h after b'fast: 197 >> n/c >> 127 >> 148 - lunch: 136-236 >> 150-200 >> 105-187, 271 >> 105-145, 157 >> n/c - 2h after lunch: 189-257 >> n/c >> 240 >> n/c - dinner: 137-222 >> n/c >> 98, 148-220 >> 115-136 >> 118-190 - 2h after dinner: 227-241 >> n/c  >> 217 >> 179-207 - bedtime: 156-251>> 160-210 >> 147, 275-316 >> 177-219 >> 207 No lows; she has hypoglycemia awareness at 50s.   Pt's meals are: - Breakfast: yoghurt + granola, cereals - Lunch: sandwich, sushi, [place] - Dinner: home cooked meal: stews, etc. - Snacks: 3-4  - no CKD, last BUN/creatinine:  Lab Results  Component Value Date   BUN 15 12/21/2013   CREATININE 0.9 12/21/2013  Was on benazepril. Angioedema from ACEI 12/19/2012! - last set of lipids: Lab Results  Component Value Date   CHOL 179 11/02/2013   HDL 32.70* 11/02/2013   LDLCALC 105* 11/12/2012   LDLDIRECT 109.2 11/02/2013   TRIG 222.0* 11/02/2013   CHOLHDL 5 11/02/2013  Not on a statin.  - last eye exam was 04/10/2013.  No DR.  - mild numbness and tingling in her  feet.  She also has a history of MS (neuro Dr Arlice Colt) - last 2 atacks: 02/2012, 2010; also HTN, borderline HL, fatty liver, endometriosis.  I reviewed pt's medications, allergies, PMH, social hx, family hx, and changes were documented in the history of present illness. Otherwise, unchanged from my initial visit note.  ROS: Constitutional: + weight gain, + fatigue, no subjective hyperthermia Eyes: no blurry vision, no xerophthalmia ENT: no sore throat, no nodules palpated in throat, no dysphagia/odynophagia, no hoarseness Cardiovascular: no CP/SOB/palpitations/leg swelling Respiratory: no cough/SOB Gastrointestinal: no N/V/+ D/no C Musculoskeletal: no muscle aches/no joint aches Skin: no rashes, no hair loss Neurological: no tremors/numbness/tingling/dizziness  PE: BP 114/68 mmHg  Pulse 98  Temp(Src) 97.8 F (36.6 C) (Oral)  Resp 12  Wt 219 lb (99.338 kg)  SpO2 96% Wt Readings from Last 3 Encounters:  04/30/14 219 lb (99.338 kg)  04/27/14 220 lb (99.791 kg)  02/15/14 218 lb 6.4 oz (99.066 kg)   Constitutional: overweight, in NAD Eyes: PERRLA, EOMI, no exophthalmos ENT: moist mucous membranes, no thyromegaly, no cervical lymphadenopathy Cardiovascular: tachycardia, RR, No MRG Respiratory: CTA B Gastrointestinal: abdomen soft, NT, ND, BS+ Musculoskeletal: no deformities, strength intact in all 4 Skin: moist, warm, no rashes Neurological: no tremor with outstretched hands  ASSESSMENT: 1.  DM2, insulin-dependent, uncontrolled, without complications  PLAN:  1. Patient with a h/o DM2 (exacerbated by steroid use for MS), recently less well controlled after an initial improvement after starting Glipizide. We discussed about possible addition of Cycloset but refuses for now. She also refuses a referral to nutrition. I made several suggestions for improving her diet and given her handouts. I also suggested mild exercise before dinner. She will work on these until next visit. -   I advised her to:  Patient Instructions  Please continue: - Metformin ER 1000 mg 2x a day - Levemir 40 units at night  - Victoza 1.8 mg daily in am - Glipizide XL 10 mg daily in am.  Please return in 3 months with your sugar log.   Please stop at the lab.   - may need mealtime insulin if starts steroids for MS again - continue checking sugars at different times of the day - check 2 times a day, rotating checks  - needs a new eye exam >> advised to schedule - refilled Glipizide XL - will check HbA1c, ACR,  and several other labs as per PCP request - Return to clinic in 3 mo with sugar log   Office Visit on 04/30/2014  Component Date Value Ref Range Status  . Hgb A1c MFr Bld 04/30/2014 8.6* 4.6 - 6.5 % Final   Glycemic Control Guidelines for People with Diabetes:Non Diabetic:  <6%Goal of Therapy: <7%Additional Action Suggested:  >8%   . TSH 04/30/2014 2.25  0.35 - 4.50 uIU/mL Final  . Sodium 04/30/2014 140  135 - 145 mEq/L Final  . Potassium 04/30/2014 4.1  3.5 - 5.3 mEq/L Final  . Chloride 04/30/2014 103  96 - 112 mEq/L Final  . CO2 04/30/2014 25  19 - 32 mEq/L Final  . Glucose, Bld 04/30/2014 222* 70 - 99 mg/dL Final  . BUN 04/30/2014 12  6 - 23 mg/dL Final  . Creat 04/30/2014 0.73  0.50 - 1.10 mg/dL Final  . Total Bilirubin 04/30/2014 0.4  0.2 - 1.2 mg/dL Final  . Alkaline Phosphatase 04/30/2014 72  39 - 117 U/L Final  . AST 04/30/2014 24  0 - 37 U/L Final  . ALT 04/30/2014 26  0 - 35 U/L Final  . Total Protein 04/30/2014 6.4  6.0 - 8.3 g/dL Final  . Albumin 04/30/2014 3.7  3.5 - 5.2 g/dL Final  . Calcium 04/30/2014 9.1  8.4 - 10.5 mg/dL Final  . GFR, Est African American 04/30/2014 >89   Final  . GFR, Est Non African American 04/30/2014 >89   Final   Comment:   The estimated GFR is a calculation valid for adults (>=88 years old) that uses the CKD-EPI algorithm to adjust for age and sex. It is   not to be used for children, pregnant women, hospitalized patients,     patients on dialysis, or with rapidly changing kidney function. According to the NKDEP, eGFR >89 is normal, 60-89 shows mild impairment, 30-59 shows moderate impairment, 15-29 shows severe impairment and <15 is ESRD.     . WBC 04/30/2014 10.0  4.0 - 10.5 K/uL Final  . RBC 04/30/2014 4.92  3.87 - 5.11 Mil/uL Final  . Platelets 04/30/2014 370.0  150.0 - 400.0 K/uL Final  . Hemoglobin 04/30/2014 14.0  12.0 - 15.0 g/dL Final  . HCT 04/30/2014 41.6  36.0 - 46.0 % Final  . MCV 04/30/2014 84.5  78.0 - 100.0 fl Final  . MCHC 04/30/2014 33.8  30.0 - 36.0  g/dL Final  . RDW 04/30/2014 14.0  11.5 - 15.5 % Final  . Microalb, Ur 04/30/2014 8.9* 0.0 - 1.9 mg/dL Final  . Creatinine,U 04/30/2014 258.0   Final  . Microalb Creat Ratio 04/30/2014 3.4  0.0 - 30.0 mg/g Final   HbA1c better. Glu high. The rest of the labs are normal, including the TSH.

## 2014-04-30 NOTE — Patient Instructions (Addendum)
Please continue: - Metformin ER 1000 mg 2x a day - Levemir 40 units at night  - Victoza 1.8 mg daily in am - Glipizide XL 10 mg daily in am.  Please return in 3 months with your sugar log.   Please stop at the lab.   Please consider the following ways to cut down carbs and fat and increase fiber and micronutrients in your diet: - substitute whole grain for white bread or pasta - substitute brown rice for white rice - substitute 90-calorie flat bread pieces for slices of bread when possible - substitute sweet potatoes or yams for white potatoes - substitute humus for margarine - substitute tofu for cheese when possible - substitute almond or rice milk for regular milk (would not drink soy milk daily due to concern for soy estrogen influence on breast cancer risk) - substitute dark chocolate for other sweets when possible - substitute water - can add lemon or orange slices for taste - for diet sodas (artificial sweeteners will trick your body that you can eat sweets without getting calories and will lead you to overeating and weight gain in the long run) - do not skip breakfast or other meals (this will slow down the metabolism and will result in more weight gain over time)  - can try smoothies made from fruit and almond/rice milk in am instead of regular breakfast - can also try old-fashioned (not instant) oatmeal made with almond/rice milk in am - order the dressing on the side when eating salad at a restaurant (pour less than half of the dressing on the salad) - eat as little meat as possible - can try juicing, but should not forget that juicing will get rid of the fiber, so would alternate with eating raw veg./fruits or drinking smoothies - use as little oil as possible, even when using olive oil - can dress a salad with a mix of balsamic vinegar and lemon juice, for e.g. - use agave nectar, stevia sugar, or regular sugar rather than artificial sweateners - steam or broil/roast veggies   - snack on veggies/fruit/nuts (unsalted, preferably) when possible, rather than processed foods - reduce or eliminate aspartame in diet (it is in diet sodas, chewing gum, etc) Read the labels!  Try to replace snacking on these with drinking/eating: * soy or almond milk * veggies with humus or other low calorie/low fat dip * low glycemic index fruits (higher glycemic index = higher risk to increase your sugars):          http://www.health.http://flores-mcbride.com/ * fruit/veggie smoothies          Ninja blender recipes:          https://www.moore-west.com/ * unsalted nuts Etc.  Try to read Dr. Janene Harvey book: "Program for Reversing Diabetes" for the vegan concept and other ideas for healthy eating.

## 2014-05-01 LAB — COMPLETE METABOLIC PANEL WITH GFR
ALK PHOS: 72 U/L (ref 39–117)
ALT: 26 U/L (ref 0–35)
AST: 24 U/L (ref 0–37)
Albumin: 3.7 g/dL (ref 3.5–5.2)
BUN: 12 mg/dL (ref 6–23)
CO2: 25 mEq/L (ref 19–32)
CREATININE: 0.73 mg/dL (ref 0.50–1.10)
Calcium: 9.1 mg/dL (ref 8.4–10.5)
Chloride: 103 mEq/L (ref 96–112)
Glucose, Bld: 222 mg/dL — ABNORMAL HIGH (ref 70–99)
POTASSIUM: 4.1 meq/L (ref 3.5–5.3)
SODIUM: 140 meq/L (ref 135–145)
Total Bilirubin: 0.4 mg/dL (ref 0.2–1.2)
Total Protein: 6.4 g/dL (ref 6.0–8.3)

## 2014-05-10 ENCOUNTER — Ambulatory Visit (INDEPENDENT_AMBULATORY_CARE_PROVIDER_SITE_OTHER): Payer: BLUE CROSS/BLUE SHIELD | Admitting: Family Medicine

## 2014-05-10 ENCOUNTER — Encounter: Payer: Self-pay | Admitting: Family Medicine

## 2014-05-10 VITALS — BP 118/84 | HR 81 | Temp 98.5°F | Wt 220.0 lb

## 2014-05-10 DIAGNOSIS — E1165 Type 2 diabetes mellitus with hyperglycemia: Secondary | ICD-10-CM

## 2014-05-10 DIAGNOSIS — E785 Hyperlipidemia, unspecified: Secondary | ICD-10-CM

## 2014-05-10 DIAGNOSIS — Z0000 Encounter for general adult medical examination without abnormal findings: Secondary | ICD-10-CM | POA: Diagnosis not present

## 2014-05-10 DIAGNOSIS — E1151 Type 2 diabetes mellitus with diabetic peripheral angiopathy without gangrene: Secondary | ICD-10-CM

## 2014-05-10 DIAGNOSIS — IMO0002 Reserved for concepts with insufficient information to code with codable children: Secondary | ICD-10-CM

## 2014-05-10 DIAGNOSIS — I10 Essential (primary) hypertension: Secondary | ICD-10-CM

## 2014-05-10 NOTE — Assessment & Plan Note (Signed)
S: followed by endocrine. A1c decreasing from >9 to 8.6. Working on transitioning cereal to other choices in Am and working on exercise though this is an uphill battle with her fatigue levels A/P: encouraged patient on healthy lifestyle changes. Also completed diabetic foot exam.

## 2014-05-10 NOTE — Assessment & Plan Note (Signed)
S: controlled on Amlodipine 10mg , carvedilol 6.25mg  BID A/P: continue current therapy.

## 2014-05-10 NOTE — Assessment & Plan Note (Signed)
S:  LDL around 110, healthy lifestyle changes planned. Exercise limited by MS and fatigue as well as stress from work. Trig also slightly above 200.  A/P:Hold off on repeat levels until some dietary/exercse changes. Discussed if not able to turn the corner with lifestyle changes, consider statin at follow up physical.

## 2014-05-10 NOTE — Patient Instructions (Addendum)
Sign release of information at the front desk for your ob/gyn for your pap smear and mammogram and breast exam report. Or  Have pap results faxed to [place] at [phone].   Please schedule eye exam and Please also have your eye doctor fax [place] their report at [phone].   Please ask your neurologist if they would recommend a repeat pneumovax vaccine based on your tysabri. You received your first one in 2009. Can schedule visit with Bevelyn Ngo for this if they recommend the 2nd one before age 86.   See you back in a year. Overall things look great. Stay off of chairs!

## 2014-05-10 NOTE — Progress Notes (Signed)
Kristin Reddish, MD Phone: [phone]  Subjective:  Patient presents today for their annual physical. Chief complaint-noted.   See problem oriented charting- ROS- full  review of systems was completed and negative  With pertinent positives: fatigue, weakness at times as part of MS. Pertinent negatives, no chest pain, shortness of breath, abdominal pain, nausea, vomiting, diarrhea.   The following were reviewed and entered/updated in epic: Past Medical History  Diagnosis Date  . Diabetes mellitus     Type 2  . Hypertension   . Multiple sclerosis   . Endometriosis   . Depression   . Type II or unspecified type diabetes mellitus with peripheral circulatory disorders, uncontrolled(250.72) 11/27/2012   Patient Active Problem List   Diagnosis Date Noted  . Uncontrolled type 2 diabetes mellitus with peripheral circulatory disorder 11/27/2012    Priority: High  . Multiple sclerosis 07/05/2006    Priority: High  . Depression 02/25/2008    Priority: Medium  . Hyperlipidemia 08/29/2006    Priority: Medium  . Essential hypertension 07/05/2006    Priority: Medium  . Allergic rhinitis 04/27/2014    Priority: Low  . Low back pain 04/27/2014    Priority: Low  . Endometriosis 07/05/2006    Priority: Low   Past Surgical History  Procedure Laterality Date  . Lumbar disc surgery      LS spine 2005 or so  . Wisdom tooth extraction    . Exploratory laparotomy      endometriosis    Family History  Problem Relation Age of Onset  . Breast cancer Mother     early 73s  . Diabetes Mother     maternal grandmother as well  . Diabetes Father   . Other Father     progressive supranuclear palsy    Medications- reviewed and updated Current Outpatient Prescriptions  Medication Sig Dispense Refill  . amLODipine (NORVASC) 10 MG tablet Take 1 tablet (10 mg total) by mouth daily. 90 tablet 3  . carvedilol (COREG) 6.25 MG tablet TAKE 1 TABLET BY MOUTH TWICE DAILY WITH A MEAL 180 tablet 3    . cetirizine (ZYRTEC) 10 MG tablet Take 10 mg by mouth every evening.     . DULoxetine (CYMBALTA) 60 MG capsule Take 1 capsule (60 mg total) by mouth daily. 90 capsule 3  . glipiZIDE (GLUCOTROL XL) 10 MG 24 hr tablet TAKE 1 TABLET (10 MG TOTAL) BY MOUTH DAILY WITH BREAKFAST. 90 tablet 3  . glucose blood test strip Test blood sugar 2 times a day, vary times as instructed. 200 each 2  . Insulin Detemir (LEVEMIR FLEXTOUCH) 100 UNIT/ML Pen Inject 30 units into the skin at bedtime. (Patient taking differently: Inject 42 Units into the skin at bedtime. Inject 30 units into the skin at bedtime.) 30 mL 3  . Insulin Pen Needle (BD PEN NEEDLE NANO U/F) 32G X 4 MM MISC Use 2x a day 200 each 3  . levonorgestrel-ethinyl estradiol (LYBREL,AMETHYST) 90-20 MCG tablet Take 1 tablet by mouth daily.     . Liraglutide 18 MG/3ML SOPN Inject 1.8 mLs into the skin daily.     . metformin (FORTAMET) 1000 MG (OSM) 24 hr tablet Take 1,000 mg by mouth 2 (two) times daily with a meal.    . modafinil (PROVIGIL) 200 MG tablet Take 1 tablet (200 mg total) by mouth daily as needed. 30 tablet 5  . Naproxen Sodium 220 MG CAPS Take 440 mg by mouth 2 (two) times daily as needed (for pain.).     Marland Kitchen  natalizumab (TYSABRI) 300 MG/15ML injection Inject 300 mg into the vein every 30 (thirty) days.    . [DISCONTINUED] amLODipine-benazepril (LOTREL) 10-20 MG per capsule Take 1 capsule by mouth daily. 90 capsule 0   No current facility-administered medications for this visit.    Allergies-reviewed and updated Allergies  Allergen Reactions  . Ace Inhibitors Swelling    Edema    History   Social History  . Marital Status: Single    Spouse Name: N/A  . Number of Children: N/A  . Years of Education: N/A   Social History Main Topics  . Smoking status: Never Smoker   . Smokeless tobacco: Not on file  . Alcohol Use: 0.0 oz/week    0 Standard drinks or equivalent per week     Comment: occasional  . Drug Use: No  . Sexual  Activity: Not Currently   Other Topics Concern  . None   Social History Narrative   Single. Lives alone. No children. Pet cat.       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 Oropharynx normal Declines breast and gyn exam to Ob/gyn.  Neck: no thyromegaly CV: RRR no murmurs rubs or gallops Lungs: CTAB no crackles, wheeze, rhonchi Abdomen: soft/nontender/nondistended/normal bowel sounds. No rebound or guarding.  Ext: no edema Skin: warm, dry, no rash, no signs of precancerous lesions or back or abdomen-declines full skin exam Neuro: grossly normal, moves all extremities, PERRLA  Diabetic Foot Exam - Simple   Simple Foot Form  Diabetic Foot exam was performed with the following findings:  Yes 05/10/2014  2:03 PM  Visual Inspection  No deformities, no ulcerations, no other skin breakdown bilaterally:  Yes  Sensation Testing  Intact to touch and monofilament testing bilaterally:  Yes  Pulse Check  Posterior Tibialis and Dorsalis pulse intact bilaterally:  Yes  Comments     Assessment/Plan:  44 y.o. female presenting for annual physical.  Health Maintenance counseling: 1. Anticipatory guidance: Patient counseled regarding regular dental exams, wearing seatbelts, wearing sunscreen. Does not see dermatologist.  2. Risk factor reduction:  Advised patient of need for regular exercise (no exercise currently-struggling with new job and fatigue) and diet rich and fruits and vegetables to reduce risk of heart attack and stroke (also working on switching breakfast). Has had weight gain and working to reverse.   3. Immunizations/screenings/ancillary studies Health Maintenance Due  Topic Date  Due  . HIV Screening - next labs 07/30/1984  . FOOT EXAM - today 11/27/2013  . PAP SMEAR - get records 01/15/2014  . OPHTHALMOLOGY EXAM - to schedule 04/11/2014   Hyperlipidemia S:  LDL around 110, healthy lifestyle changes planned. Exercise limited by MS and fatigue as well as stress from work. Trig also slightly above 200.  A/P:Hold off on repeat levels until some dietary/exercse changes. Discussed if not able to turn the corner with lifestyle changes, consider statin at follow up physical.      Essential hypertension S: controlled on Amlodipine 10mg , carvedilol 6.25mg  BID A/P: continue current therapy.     Uncontrolled type 2 diabetes mellitus with peripheral circulatory disorder S: followed by endocrine. A1c decreasing from >  9 to 8.6. Working on transitioning cereal to other choices in Am and working on exercise though this is an uphill battle with her fatigue levels A/P: encouraged patient on healthy lifestyle changes. Also completed diabetic foot exam.     Check in on pneumovax with neurology. May benefit from repeat as last in 2009 and on tysabri.  1 year CPE as long as following with endocrine and neuro.

## 2014-05-12 ENCOUNTER — Encounter: Payer: Self-pay | Admitting: Family Medicine

## 2014-05-13 ENCOUNTER — Other Ambulatory Visit: Payer: Self-pay | Admitting: *Deleted

## 2014-05-13 MED ORDER — MODAFINIL 200 MG PO TABS: 200.0000 mg | ORAL_TABLET | Freq: Every day | ORAL | Status: DC | PRN

## 2014-05-13 NOTE — Telephone Encounter (Signed)
Modafinil r/f during appt. for Tysabri infusion/fim

## 2014-05-17 ENCOUNTER — Other Ambulatory Visit: Payer: Self-pay | Admitting: *Deleted

## 2014-05-17 MED ORDER — INSULIN DETEMIR 100 UNIT/ML FLEXPEN: PEN_INJECTOR | SUBCUTANEOUS | Status: DC

## 2014-05-17 MED ORDER — LIRAGLUTIDE 18 MG/3ML ~~LOC~~ SOPN: 1.8000 mL | PEN_INJECTOR | Freq: Every day | SUBCUTANEOUS | Status: DC

## 2014-05-20 ENCOUNTER — Other Ambulatory Visit: Payer: Self-pay | Admitting: *Deleted

## 2014-05-20 MED ORDER — LIRAGLUTIDE 18 MG/3ML ~~LOC~~ SOPN: 1.8000 mL | PEN_INJECTOR | Freq: Every day | SUBCUTANEOUS | Status: DC

## 2014-05-20 NOTE — Telephone Encounter (Signed)
Pt needed 90 day supply

## 2014-06-09 ENCOUNTER — Other Ambulatory Visit: Payer: Self-pay | Admitting: Internal Medicine

## 2014-06-09 ENCOUNTER — Encounter (HOSPITAL_BASED_OUTPATIENT_CLINIC_OR_DEPARTMENT_OTHER): Payer: Self-pay

## 2014-06-09 ENCOUNTER — Emergency Department (HOSPITAL_BASED_OUTPATIENT_CLINIC_OR_DEPARTMENT_OTHER): Payer: BLUE CROSS/BLUE SHIELD

## 2014-06-09 ENCOUNTER — Emergency Department (HOSPITAL_BASED_OUTPATIENT_CLINIC_OR_DEPARTMENT_OTHER)
Admission: EM | Admit: 2014-06-09 | Discharge: 2014-06-09 | Disposition: A | Discharge status: Home / Self Care | Payer: BLUE CROSS/BLUE SHIELD | Attending: Emergency Medicine | Admitting: Emergency Medicine

## 2014-06-09 DIAGNOSIS — G35 Multiple sclerosis: Secondary | ICD-10-CM | POA: Insufficient documentation

## 2014-06-09 DIAGNOSIS — Z8742 Personal history of other diseases of the female genital tract: Secondary | ICD-10-CM | POA: Insufficient documentation

## 2014-06-09 DIAGNOSIS — R109 Unspecified abdominal pain: Secondary | ICD-10-CM

## 2014-06-09 DIAGNOSIS — N201 Calculus of ureter: Secondary | ICD-10-CM | POA: Diagnosis not present

## 2014-06-09 DIAGNOSIS — E1159 Type 2 diabetes mellitus with other circulatory complications: Secondary | ICD-10-CM | POA: Diagnosis not present

## 2014-06-09 DIAGNOSIS — R1031 Right lower quadrant pain: Secondary | ICD-10-CM | POA: Diagnosis present

## 2014-06-09 DIAGNOSIS — Z79899 Other long term (current) drug therapy: Secondary | ICD-10-CM | POA: Diagnosis not present

## 2014-06-09 DIAGNOSIS — Z794 Long term (current) use of insulin: Secondary | ICD-10-CM | POA: Diagnosis not present

## 2014-06-09 DIAGNOSIS — I10 Essential (primary) hypertension: Secondary | ICD-10-CM | POA: Insufficient documentation

## 2014-06-09 DIAGNOSIS — F329 Major depressive disorder, single episode, unspecified: Secondary | ICD-10-CM | POA: Insufficient documentation

## 2014-06-09 DIAGNOSIS — Z3202 Encounter for pregnancy test, result negative: Secondary | ICD-10-CM | POA: Insufficient documentation

## 2014-06-09 LAB — URINE MICROSCOPIC-ADD ON

## 2014-06-09 LAB — URINALYSIS, ROUTINE W REFLEX MICROSCOPIC
Bilirubin Urine: NEGATIVE
Glucose, UA: >1000 mg/dL — AB
KETONES UR: 15 mg/dL — AB
Leukocytes, UA: NEGATIVE
Nitrite: NEGATIVE
PH: 6 (ref 5.0–8.0)
Protein, ur: 30 mg/dL — AB
SPECIFIC GRAVITY, URINE: 1.04 — AB (ref 1.005–1.030)
UROBILINOGEN UA: 0.2 mg/dL (ref 0.0–1.0)

## 2014-06-09 LAB — PREGNANCY, URINE: Preg Test, Ur: NEGATIVE

## 2014-06-09 MED ORDER — OXYCODONE-ACETAMINOPHEN 5-325 MG PO TABS
1.0000 | ORAL_TABLET | ORAL | Status: DC | PRN
Start: 2014-06-09 — End: 2015-02-22

## 2014-06-09 NOTE — ED Notes (Addendum)
RLQ pain, hematuria x 4 days-sent from [facility]

## 2014-06-09 NOTE — ED Notes (Signed)
C/o rlq abd pain x 4 days   Pain is dull and mild,  Nausea at times,  Not at present,  Bilateral back pain and blood in urine

## 2014-06-09 NOTE — ED Provider Notes (Signed)
CSN: [id number]     Arrival date & time 06/09/14  1847 History  This chart was scribed for Ripley Fraise, MD by Molli Posey, ED Scribe. This patient was seen in room MH05/MH05 and the patient's care was started 7:32 PM.   Chief Complaint  Patient presents with  . Abdominal Pain   Patient is a 44 y.o. female presenting with abdominal pain. The history is provided by the patient. No language interpreter was used.  Abdominal Pain Pain location:  RLQ, R flank and L flank Pain quality: aching and dull   Pain radiates to:  Does not radiate Pain severity:  Mild Duration:  4 days Timing:  Constant Associated symptoms: hematuria   Associated symptoms: no chest pain, no dysuria, no fever, no shortness of breath, no vaginal bleeding, no vaginal discharge and no vomiting    HPI Comments: Kristin Mathews is a 44 y.o. female who presents to the Emergency Department complaining of mild bilateral flank pain and RLQ pain that started 4 days ago. Pt describes her pain as dull and achy. She reports her flank pain does not radiate. Pt was sent from UC and states that they detected blood in her urine. Pt reports a similar episode 3 months and states her OBGYN prescribed her Abx. She says she was told it was either kidney stones that self resolved or a possible infection. Pt reports a hx of kidney stones and states her last episode was a long time ago. She denies fever, vomiting, CP, SOB, dysuria, vaginal bleeding or discharge or a decrease in appetite.    Past Medical History  Diagnosis Date  . Diabetes mellitus     Type 2  . Hypertension   . Multiple sclerosis   . Endometriosis   . Depression   . Type II or unspecified type diabetes mellitus with peripheral circulatory disorders, uncontrolled(250.72) 11/27/2012   Past Surgical History  Procedure Laterality Date  . Lumbar disc surgery      LS spine 2005 or so  . Wisdom tooth extraction    . Exploratory laparotomy      endometriosis   Family  History  Problem Relation Age of Onset  . Breast cancer Mother     early 69s  . Diabetes Mother     maternal grandmother as well  . Diabetes Father   . Other Father     progressive supranuclear palsy   History  Substance Use Topics  . Smoking status: Never Smoker   . Smokeless tobacco: Not on file  . Alcohol Use: 0.0 oz/week    0 Standard drinks or equivalent per week     Comment: occasional   OB History    No data available     Review of Systems  Constitutional: Negative for fever and appetite change.  Respiratory: Negative for shortness of breath.   Cardiovascular: Negative for chest pain.  Gastrointestinal: Positive for abdominal pain. Negative for vomiting.  Genitourinary: Positive for hematuria. Negative for dysuria, vaginal bleeding, vaginal discharge and difficulty urinating.  All other systems reviewed and are negative.     Allergies  Ace inhibitors  Home Medications   Prior to Admission medications   Medication Sig Start Date End Date Taking? Authorizing Provider  amLODipine (NORVASC) 10 MG tablet Take 1 tablet (10 mg total) by mouth daily. 04/27/14   Marin Olp, MD  carvedilol (COREG) 6.25 MG tablet TAKE 1 TABLET BY MOUTH TWICE DAILY WITH A MEAL 04/27/14   Marin Olp, MD  cetirizine (ZYRTEC) 10 MG tablet Take 10 mg by mouth every evening.     Historical Provider, MD  DULoxetine (CYMBALTA) 60 MG capsule Take 1 capsule (60 mg total) by mouth daily. 04/30/13   Eulas Post, MD  glipiZIDE (GLUCOTROL XL) 10 MG 24 hr tablet TAKE 1 TABLET (10 MG TOTAL) BY MOUTH DAILY WITH BREAKFAST. 04/30/14   [name], MD  glucose blood test strip Test blood sugar 2 times a day, vary times as instructed. 04/01/13   [name], MD  Insulin Detemir (LEVEMIR FLEXTOUCH) 100 UNIT/ML Pen Inject 40 units into the skin at bedtime. 05/17/14   [name], MD  Insulin Pen Needle (BD PEN NEEDLE NANO U/F) 32G X 4 MM MISC Use 2x a day 12/21/13   [name], MD   levonorgestrel-ethinyl estradiol (LYBREL,AMETHYST) 90-20 MCG tablet Take 1 tablet by mouth daily.     Historical Provider, MD  Liraglutide 18 MG/3ML SOPN Inject 1.8 mLs (10.8 mg total) into the skin daily. 05/20/14   [name], MD  metformin (FORTAMET) 1000 MG (OSM) 24 hr tablet Take 1,000 mg by mouth 2 (two) times daily with a meal. 04/14/13   [name], MD  modafinil (PROVIGIL) 200 MG tablet Take 1 tablet (200 mg total) by mouth daily as needed. 05/13/14   Britt Bottom, MD  Naproxen Sodium 220 MG CAPS Take 440 mg by mouth 2 (two) times daily as needed (for pain.).     Historical Provider, MD  natalizumab (TYSABRI) 300 MG/15ML injection Inject 300 mg into the vein every 30 (thirty) days.    Historical Provider, MD   BP 153/101 mmHg  Pulse 92  Temp(Src) 98.6 F (37 C) (Oral)  Resp 18  Ht 5\' 7"  (1.702 m)  Wt 222 lb 10.6 oz (100.999 kg)  BMI 34.87 kg/m2  SpO2 99% Physical Exam CONSTITUTIONAL: Well developed/well nourished HEAD: Normocephalic/atraumatic EYES: EOMI/PERRL ENMT: Mucous membranes moist NECK: supple no meningeal signs SPINE/BACK:entire spine nontender CV: S1/S2 noted, no murmurs/rubs/gallops noted LUNGS: Lungs are clear to auscultation bilaterally, no apparent distress ABDOMEN: soft, mild RLQ tenderness, no rebound or guarding, bowel sounds noted throughout abdomen GU:no cva tenderness NEURO: Pt is awake/alert/appropriate, moves all extremitiesx4.  No facial droop.   EXTREMITIES: pulses normal/equal, full ROM SKIN: warm, color normal PSYCH: no abnormalities of mood noted, alert and oriented to situation  ED Course  Procedures   DIAGNOSTIC STUDIES: Oxygen Saturation is 99% on RA, normal by my interpretation.    COORDINATION OF CARE: 7:37 PM Discussed treatment plan with pt at bedside and pt agreed to plan. Pt improved She would like go home She already has urology followup  Labs Review Labs Reviewed  URINALYSIS, [place] (NOT AT  [facility]) - Abnormal; Notable for the following:    APPearance CLOUDY (*)    Specific Gravity, Urine 1.040 (*)    Glucose, UA >1000 (*)    Hgb urine dipstick LARGE (*)    Ketones, ur 15 (*)    Protein, ur 30 (*)    All other components within normal limits  URINE MICROSCOPIC-ADD ON - Abnormal; Notable for the following:    Bacteria, UA FEW (*)    All other components within normal limits  PREGNANCY, URINE    Imaging Review Ct Renal Stone Study  06/09/2014   CLINICAL DATA:  Right-sided flank pain since Sunday. Hematuria. History of renal stones.  EXAM: CT ABDOMEN AND PELVIS WITHOUT CONTRAST  TECHNIQUE: Multidetector CT imaging of the abdomen and pelvis  was performed following the standard protocol without IV contrast.  COMPARISON:  None.  FINDINGS: The lack of intravenous contrast limits the ability to evaluate solid abdominal organs.  There is a punctate (approximately 6 mm) stone within the distal aspect of the right ureter, immediately cranial to the right UPJ (representative axial image 75, series 2, coronal image 66, series 5). This finding is without associated associated upstream ureterectasis or pelvicaliectasis.  Note is made of an additional punctate (approximately 2 mm) nonobstructing stone within the superior pole of the right kidney. No left-sided nephrolithiasis. No renal stones are seen along the expected location of the left ureter. Normal noncontrast appearance of the urinary bladder given degree distention.  Normal hepatic contour. The gallbladder is underdistended and the suboptimally evaluated. No ascites.  Normal noncontrast appearance of the bilateral adrenal glands, pancreas and spleen. Incidental note is made of a small splenule.  Moderate to large colonic stool burden without evidence of enteric obstruction. The bowel is normal in course and caliber without wall thickening. Normal noncontrast appearance of the retrocecal appendix. No pneumoperitoneum, pneumatosis or portal venous  gas.  Normal caliber of the abdominal aorta.  No bulky retroperitoneal, mesenteric, pelvic or inguinal lymphadenopathy.  Normal noncontrast appearance of the pelvic organs. No discrete adnexal lesion. No free fluid in the pelvic cul-de-sac.  Limited visualization of lower thorax demonstrates minimal subpleural dependent ground-glass atelectasis. No focal airspace opacities. No pleural effusion.  Normal heart size.  No pericardial effusion.  No acute or aggressive osseous abnormalities. Mild to moderate multilevel lumbar spine DDD, worse at L5-S1 and to a lesser extent, L4-L5 with disc space height loss, endplate irregularity and sclerosis.  Calcifications are noted within the subcutaneous tissues about the ventral aspect of the lower abdomen, likely granulomas of the sequela of prior subcutaneous medication administration. There is a minimal on subcutaneous edema at the level of the midline of the low back.  IMPRESSION: 1. Punctate (approximately 6 mm) nonobstructing stone within the distal aspect of the right ureter likely serves as the etiology of the patient's right-sided flank pain and hematuria. 2. Additional punctate (approximately 2 mm) nonobstructing stone within the superior pole of the right kidney. 3. No evidence of left-sided nephrolithiasis or urinary obstruction.   Electronically Signed   By: Sandi Mariscal M.D.   On: 06/09/2014 20:26      MDM   Final diagnoses:  Flank pain  Right ureteral stone    Nursing notes including past medical history and social history reviewed and considered in documentation Labs/vital reviewed myself and considered during evaluation    I personally performed the services described in this documentation, which was scribed in my presence. The recorded information has been reviewed and is accurate.        Ripley Fraise, MD 06/09/14 2044

## 2014-06-14 ENCOUNTER — Other Ambulatory Visit: Payer: Self-pay | Admitting: Family Medicine

## 2014-08-03 ENCOUNTER — Encounter: Payer: Self-pay | Admitting: Internal Medicine

## 2014-08-03 ENCOUNTER — Ambulatory Visit (INDEPENDENT_AMBULATORY_CARE_PROVIDER_SITE_OTHER): Payer: BLUE CROSS/BLUE SHIELD | Admitting: Internal Medicine

## 2014-08-03 ENCOUNTER — Other Ambulatory Visit (INDEPENDENT_AMBULATORY_CARE_PROVIDER_SITE_OTHER): Payer: BLUE CROSS/BLUE SHIELD | Admitting: *Deleted

## 2014-08-03 VITALS — BP 122/80 | HR 92 | Temp 98.0°F | Resp 12 | Wt 218.0 lb

## 2014-08-03 DIAGNOSIS — E1159 Type 2 diabetes mellitus with other circulatory complications: Secondary | ICD-10-CM

## 2014-08-03 DIAGNOSIS — IMO0002 Reserved for concepts with insufficient information to code with codable children: Secondary | ICD-10-CM

## 2014-08-03 DIAGNOSIS — E1151 Type 2 diabetes mellitus with diabetic peripheral angiopathy without gangrene: Secondary | ICD-10-CM

## 2014-08-03 DIAGNOSIS — E1165 Type 2 diabetes mellitus with hyperglycemia: Secondary | ICD-10-CM

## 2014-08-03 LAB — POCT GLYCOSYLATED HEMOGLOBIN (HGB A1C): Hemoglobin A1C: 8.2

## 2014-08-03 MED ORDER — INSULIN LISPRO 100 UNIT/ML (KWIKPEN): 6.0000 [IU] | PEN_INJECTOR | Freq: Every day | SUBCUTANEOUS | Status: DC

## 2014-08-03 MED ORDER — INSULIN DETEMIR 100 UNIT/ML FLEXPEN: PEN_INJECTOR | SUBCUTANEOUS | Status: DC

## 2014-08-03 NOTE — Patient Instructions (Signed)
Please continue: - Metformin ER 1000 mg 2x a day - Victoza 1.8 mg daily  - Glipizide XL 10 mg in am   Decrease Levemir to 35 units at bedtime.  Please add Novolog: - 6 units with a smaller meal >> if sugars at bedtime still >180 >> increase to 8 units - 10 units with a larger meal >> if sugars at bedtime still >180 >> increase to 12 units  Please return in 1.5 month with your sugar log.

## 2014-08-03 NOTE — Progress Notes (Signed)
Patient ID: Kristin Mathews, female   DOB: 10-20-1970, 44 y.o.   MRN: [id number]  HPI: Kristin Mathews is a 44 y.o.-year-old female, returning for f/u for DM2, dx 2009, insulin-dependent since 2009, uncontrolled, with complications. Last visit 3 mo ago.  She fell and had a kidney stone 05/2014 >> very high sugars.   Last hemoglobin A1c was: Lab Results  Component Value Date   HGBA1C 8.6* 04/30/2014   HGBA1C 9.3* 11/02/2013   HGBA1C 7.1* 04/14/2013   Pt is on a regimen of: - Metformin ER 1000 mg 2x a day - Levemir 42 units at night - Victoza 1.8 mg daily  - Glipizide XL 10 mg in am  - added 10/2013 She stopped Invokana 100 >> 300 >> had few yeast infections - stopped in 05/2013.  We stopped Humulin 70/30 25 units bid. Januvia - did not help.  Glumetza not covered by insurance.  Pt checks her sugars 0-1x a day (reviewed log) >> above target esp. after meals: - am: 107-231 >> 110-120 (150) >> 111-155 >> 83-122 >> 104-145 >> 84-129,153 - 2h after b'fast: 197 >> n/c >> 127 >> 148 >> 147-193 - lunch: 136-236 >> 150-200 >> 105-187, 271 >> 105-145, 157 >> n/c >> 128-149 - 2h after lunch: 189-257 >> n/c >> 240 >> n/c >> 105-194, 237, 295 - dinner: 137-222 >> n/c >> 98, 148-220 >> 115-136 >> 118-190 >> 110-139 - 2h after dinner: 227-241 >> n/c  >> 217 >> 179-207 >> 170-230 - bedtime: 156-251>> 160-210 >> 147, 275-316 >> 177-219 >> 207 >> 181-280 No lows; she has hypoglycemia awareness at 50s.   Pt's meals are: - Breakfast: yoghurt + granola, cereals - Lunch: sandwich, sushi, [place] - Dinner: home cooked meal: stews, etc. - Snacks: 3-4  - no CKD, last BUN/creatinine:  Lab Results  Component Value Date   BUN 12 04/30/2014   CREATININE 0.73 04/30/2014  Was on benazepril. Angioedema from ACEI 12/19/2012! - last set of lipids: Lab Results  Component Value Date   CHOL 179 11/02/2013   HDL 32.70* 11/02/2013   LDLCALC 105* 11/12/2012   LDLDIRECT 109.2 11/02/2013   TRIG 222.0* 11/02/2013    CHOLHDL 5 11/02/2013  Not on a statin.  - last eye exam was 04/10/2013.  No DR.  - mild numbness and tingling in her feet.  She also has a history of MS (neuro Dr Arlice Colt) - last 2 atacks: 02/2012, 2010; also HTN, borderline HL, fatty liver, endometriosis.  I reviewed pt's medications, allergies, PMH, social hx, family hx, and changes were documented in the history of present illness. Otherwise, unchanged from my initial visit note.  ROS: Constitutional: no weight gain, no fatigue, no subjective hyperthermia Eyes: no blurry vision, no xerophthalmia ENT: no sore throat, no nodules palpated in throat, no dysphagia/odynophagia, no hoarseness Cardiovascular: no CP/SOB/palpitations/leg swelling Respiratory: no cough/SOB Gastrointestinal: no N/V/D/C Musculoskeletal: no muscle aches/no joint aches Skin: no rashes, no hair loss Neurological: no tremors/numbness/tingling/dizziness  PE: BP 122/80 mmHg  Pulse 92  Temp(Src) 98 F (36.7 C) (Oral)  Resp 12  Wt 218 lb (98.884 kg)  SpO2 97% Body mass index is 34.14 kg/(m^2). Wt Readings from Last 3 Encounters:  08/03/14 218 lb (98.884 kg)  06/09/14 222 lb 10.6 oz (100.999 kg)  05/10/14 220 lb (99.791 kg)   Constitutional: overweight, in NAD Eyes: PERRLA, EOMI, no exophthalmos ENT: moist mucous membranes, no thyromegaly, no cervical lymphadenopathy Cardiovascular: tachycardia, RR, No MRG Respiratory: CTA B Gastrointestinal: abdomen soft,  NT, ND, BS+ Musculoskeletal: no deformities, strength intact in all 4 Skin: moist, warm, no rashes Neurological: no tremor with outstretched hands  ASSESSMENT: 1. DM2, insulin-dependent, uncontrolled, without complications  PLAN:  1. Patient with a h/o DM2 (exacerbated by steroid use for MS), recently less well controlled after an initial improvement after starting Glipizide. We will add Humalog before dinner, but I anticipate will need to add this with all meals. Will decrease Levemir as she  drops her sugars from eve to am by ~50%. - discussed about possibly adding a VGo  - she appears interested in this, if needed -  I advised her to:  Patient Instructions  Please continue: - Metformin ER 1000 mg 2x a day - Victoza 1.8 mg daily  - Glipizide XL 10 mg in am   Decrease Levemir to 35 units at bedtime.  Please add Novolog: - 6 units with a smaller meal >> if sugars at bedtime still >180 >> increase to 8 units - 10 units with a larger meal >> if sugars at bedtime still >180 >> increase to 12 units  Please return in 1.5 month with your sugar log.    - continue checking sugars at different times of the day - check 2 times a day, rotating checks  - needs a new eye exam >> advised to schedule! - will check HbA1c today >> 8.2% (improved!) - Return to clinic in 1.5 mo with sugar log

## 2014-08-28 ENCOUNTER — Other Ambulatory Visit: Payer: Self-pay | Admitting: Internal Medicine

## 2014-09-23 LAB — HM DIABETES EYE EXAM

## 2014-09-27 ENCOUNTER — Ambulatory Visit: Payer: BLUE CROSS/BLUE SHIELD | Admitting: Internal Medicine

## 2014-10-12 ENCOUNTER — Other Ambulatory Visit: Payer: Self-pay | Admitting: *Deleted

## 2014-10-12 ENCOUNTER — Other Ambulatory Visit (INDEPENDENT_AMBULATORY_CARE_PROVIDER_SITE_OTHER): Payer: Self-pay

## 2014-10-12 DIAGNOSIS — Z79899 Other long term (current) drug therapy: Secondary | ICD-10-CM

## 2014-10-12 DIAGNOSIS — G35 Multiple sclerosis: Secondary | ICD-10-CM

## 2014-10-12 DIAGNOSIS — Z0289 Encounter for other administrative examinations: Secondary | ICD-10-CM

## 2014-10-13 LAB — HEPATIC FUNCTION PANEL
ALT: 33 IU/L — AB (ref 0–32)
AST: 25 IU/L (ref 0–40)
Albumin: 4.1 g/dL (ref 3.5–5.5)
Alkaline Phosphatase: 92 IU/L (ref 39–117)
BILIRUBIN, DIRECT: 0.11 mg/dL (ref 0.00–0.40)
Bilirubin Total: 0.3 mg/dL (ref 0.0–1.2)
Total Protein: 6.7 g/dL (ref 6.0–8.5)

## 2014-10-14 ENCOUNTER — Telehealth: Payer: Self-pay | Admitting: *Deleted

## 2014-10-14 NOTE — Telephone Encounter (Signed)
-----   Message from Britt Bottom, MD sent at 10/13/2014  7:13 PM EDT ----- Liver tests are fine. JCV is not back yet and we can let her know those results at her next infusion.

## 2014-10-14 NOTE — Telephone Encounter (Signed)
I have spoken with Kristin Mathews this morning, and per RAS, advised that lft's are ok, jcv ab is not back--I can give her these results at next infusion, or will call her if they come back high.  She verbalized understanding of same/fim

## 2014-10-21 ENCOUNTER — Ambulatory Visit (INDEPENDENT_AMBULATORY_CARE_PROVIDER_SITE_OTHER): Payer: BLUE CROSS/BLUE SHIELD | Admitting: Internal Medicine

## 2014-10-21 ENCOUNTER — Encounter: Payer: Self-pay | Admitting: Internal Medicine

## 2014-10-21 VITALS — BP 124/88 | HR 103 | Temp 98.5°F | Resp 12 | Wt 218.8 lb

## 2014-10-21 DIAGNOSIS — E1151 Type 2 diabetes mellitus with diabetic peripheral angiopathy without gangrene: Secondary | ICD-10-CM

## 2014-10-21 DIAGNOSIS — IMO0002 Reserved for concepts with insufficient information to code with codable children: Secondary | ICD-10-CM

## 2014-10-21 DIAGNOSIS — E1165 Type 2 diabetes mellitus with hyperglycemia: Secondary | ICD-10-CM

## 2014-10-21 MED ORDER — V-GO 40 KIT: PACK | Status: DC

## 2014-10-21 MED ORDER — INSULIN LISPRO 100 UNIT/ML ~~LOC~~ SOLN: 76.0000 [IU] | Freq: Once | SUBCUTANEOUS | Status: DC

## 2014-10-21 NOTE — Patient Instructions (Addendum)
Please Continue Please continue: - Metformin ER 1000 mg 2x a day - Victoza 1.8 mg daily  - Glipizide XL 10 mg in am  - Levemir 35 units at bedtime. - Humalog 15-18 units with lunch and dinner  When you get the VGo pump, continue: - Metformin ER 1000 mg 2x a day - Victoza 1.8 mg daily  - Glipizide XL 10 mg in am  - Bolus 7-9 pushes before lunch and dinner.  Please schedule an appt with Kristin Mathews for diabetes education.  Please return in 1.5 month with your sugar log.

## 2014-10-21 NOTE — Progress Notes (Signed)
Patient ID: Kristin Mathews, female   DOB: Jan 21, 1970, 44 y.o.   MRN: [id number]  HPI: Kristin Mathews is a 44 y.o.-year-old female, returning for f/u for DM2, dx 2009, insulin-dependent since 2009, uncontrolled, with complications. Last visit 2.5 mo ago.   Last hemoglobin A1c was: Lab Results  Component Value Date   HGBA1C 8.2 08/03/2014   HGBA1C 8.6* 04/30/2014   HGBA1C 9.3* 11/02/2013   Pt is on a regimen of: - Metformin ER 1000 mg 2x a day - Levemir 42 >> 35 units at night - Victoza 1.8 mg daily  - Glipizide XL 10 mg in am  - added 10/2013 - Humalog - added 07/2014: - 15 units with a smaller dinner - 18 units with a larger dinner  She stopped Invokana 100 >> 300 >> had few yeast infections - stopped in 05/2013.  We stopped Humulin 70/30 25 units bid. Januvia - did not help.  Glumetza not covered by insurance.  Pt checks her sugars 0-1x a day (reviewed log) >> above target esp. after meals: - am: 107-231 >> 110-120 (150) >> 111-155 >> 83-122 >> 104-145 >> 84-129,153 >> 86-147 - 2h after b'fast: 197 >> n/c >> 127 >> 148 >> 147-193 >> 166 - lunch: 136-236 >> 150-200 >> 105-187, 271 >> 105-145, 157 >> n/c >> 128-149 >> 83-134, 160 - 2h after lunch: 189-257 >> n/c >> 240 >> n/c >> 105-194, 237, 295 >> 169-206, 273 - dinner: 137-222 >> n/c >> 98, 148-220 >> 115-136 >> 118-190 >> 110-139 >> 108-213, 260 - 2h after dinner: 227-241 >> n/c  >> 217 >> 179-207 >> 170-230 >> 153-272 - bedtime: 156-251>> 160-210 >> 147, 275-316 >> 177-219 >> 207 >> 181-280 >> 128-243 No lows; she has hypoglycemia awareness at 50s.   Pt's meals are: - Breakfast: yoghurt + granola, cereals - Lunch: sandwich, sushi, [place] - Dinner: home cooked meal: stews, etc. - Snacks: 3-4  - no CKD, last BUN/creatinine:  Lab Results  Component Value Date   BUN 12 04/30/2014   CREATININE 0.73 04/30/2014  Was on benazepril. Angioedema from ACEI 12/19/2012! - last set of lipids: Lab Results  Component Value Date   CHOL 179 11/02/2013   HDL 32.70* 11/02/2013   LDLCALC 105* 11/12/2012   LDLDIRECT 109.2 11/02/2013   TRIG 222.0* 11/02/2013   CHOLHDL 5 11/02/2013  Not on a statin.  - last eye exam was 09/2014.  No DR.  - mild numbness and tingling in her feet.  She also has a history of MS (neuro Dr Arlice Colt) - last 2 atacks: 02/2012, 2010; also HTN, borderline HL, fatty liver, endometriosis.  I reviewed pt's medications, allergies, PMH, social hx, family hx, and changes were documented in the history of present illness. Otherwise, unchanged from my initial visit note.  She is going to [place] next week.  ROS: Constitutional: no weight gain, + fatigue, no subjective hyperthermia Eyes: no blurry vision, no xerophthalmia ENT: no sore throat, no nodules palpated in throat, no dysphagia/odynophagia, no hoarseness Cardiovascular: no CP/SOB/palpitations/leg swelling Respiratory: no cough/SOB Gastrointestinal: no N/V/D/C Musculoskeletal: no muscle aches/no joint aches Skin: no rashes, no hair loss Neurological: no tremors/numbness/tingling/dizziness  PE: BP 124/88 mmHg  Pulse 103  Temp(Src) 98.5 F (36.9 C) (Oral)  Resp 12  Wt 218 lb 12.8 oz (99.247 kg)  SpO2 97% Body mass index is 34.26 kg/(m^2). Wt Readings from Last 3 Encounters:  10/21/14 218 lb 12.8 oz (99.247 kg)  08/03/14 218 lb (98.884 kg)  06/09/14  222 lb 10.6 oz (100.999 kg)   Constitutional: overweight, in NAD Eyes: PERRLA, EOMI, no exophthalmos ENT: moist mucous membranes, no thyromegaly, no cervical lymphadenopathy Cardiovascular: tachycardia, RR, No MRG Respiratory: CTA B Gastrointestinal: abdomen soft, NT, ND, BS+ Musculoskeletal: no deformities, strength intact in all 4 Skin: moist, warm, no rashes Neurological: no tremor with outstretched hands  ASSESSMENT: 1. DM2, insulin-dependent, uncontrolled, without complications  PLAN:  1. Patient with a h/o DM2 (exacerbated by steroid use for MS), with high sugars after  lunch and dinner. At last visit, we started rapid acting insulin before dinner >> this is not enough and she needs this before lunch, also. We discussed about possibly adding a VGo at last visit >> she agrees with this. -  I advised her to:  Patient Instructions  Please continue: - Metformin ER 1000 mg 2x a day - Victoza 1.8 mg daily  - Glipizide XL 10 mg in am  - Levemir 35 units at bedtime. - Humalog 15-18 units with lunch and dinner  When you get the VGo pump, continue: - Metformin ER 1000 mg 2x a day - Victoza 1.8 mg daily  - Glipizide XL 10 mg in am  - Bolus 7-9 pushes before lunch and dinner.  Please schedule an appt with Leonia Reader for diabetes education.  Please return in 1.5 month with your sugar log.   - continue checking sugars at different times of the day - check 2-3 times a day, rotating checks  - UTD with eye exams - will reviewed HbA1c from last visit >> improved - Return to clinic in 1.5 mo with sugar log

## 2014-10-22 ENCOUNTER — Encounter: Payer: Self-pay | Admitting: *Deleted

## 2014-10-25 ENCOUNTER — Encounter: Payer: Self-pay | Admitting: Internal Medicine

## 2014-11-09 ENCOUNTER — Other Ambulatory Visit: Payer: Self-pay | Admitting: Internal Medicine

## 2014-11-15 ENCOUNTER — Other Ambulatory Visit: Payer: Self-pay | Admitting: Internal Medicine

## 2014-11-22 ENCOUNTER — Encounter: Payer: BLUE CROSS/BLUE SHIELD | Attending: Internal Medicine | Admitting: Nutrition

## 2014-11-22 DIAGNOSIS — Z794 Long term (current) use of insulin: Secondary | ICD-10-CM | POA: Insufficient documentation

## 2014-11-22 DIAGNOSIS — IMO0002 Reserved for concepts with insufficient information to code with codable children: Secondary | ICD-10-CM

## 2014-11-22 DIAGNOSIS — Z713 Dietary counseling and surveillance: Secondary | ICD-10-CM | POA: Diagnosis not present

## 2014-11-22 DIAGNOSIS — E1165 Type 2 diabetes mellitus with hyperglycemia: Secondary | ICD-10-CM | POA: Insufficient documentation

## 2014-11-22 DIAGNOSIS — E1151 Type 2 diabetes mellitus with diabetic peripheral angiopathy without gangrene: Secondary | ICD-10-CM | POA: Diagnosis present

## 2014-11-22 NOTE — Patient Instructions (Signed)
Fill and attach a new V-go every day. Take no more Levemir Give 7 to 8 button presses before lunch and supper. Test blood sugar readings before all meals and at bedtime. Call me tomorrow morning with blood sugar readings Fax or call Larene [place] on Friday with blood sugar readings

## 2014-11-22 NOTE — Progress Notes (Signed)
Patient was instructed on the how to fill, apply and use the V-go 40.  She filled a V-go 40 with Humalog vial that she brought from home.  She took her Levemir last night at 9PM, so was told to attach this V-go tonight at [facility], and to take no more levemir.   She filled a V-go as instructed, and we reviewed how and where  to attach this, and how to give the meal time boluses.  She re demonstrated how to do this, and reverbalized that she will do 7-8 button presses before lunch and supper.   She was told that each button press equals 2 units of insulin, and reported good understanding of this.   She was told to test her blood sugars before meals and at bedtime and was given a sheet to record these, along with the amount of insulin taken before lunch and supper.  She will call me in the AM and let me know how she did tonight, and will fax/call Larene [facility] with the blood sugar readings on Friday.  Fax number and telephone numbers given.    She had no final questions.

## 2014-11-23 ENCOUNTER — Telehealth: Payer: Self-pay | Admitting: Nutrition

## 2014-11-23 NOTE — Telephone Encounter (Signed)
Message left on my voice mail.  Pt. Inserted the V-go 40 acS without difficulty.  Blood sugar was 193 at 7PM, at 9PM she ate some raspberries and took 4u for this.  Blood sugar at HS was 106.  She was afraid that blood sugar would drop low, so she took it off overnight.  FBS today was 150.   I phoned her back at 4:55PM and left a message on her voice mail, saying that she should not take any additional insulin for the fruit  I told her to call me in the AM

## 2014-11-24 NOTE — Telephone Encounter (Signed)
Phone call to patient.  She had no difficulty filling V-go and applying it.  Said FBS was 90 today with no low blood sugars during the night. Reminded her to call readings on Friday.

## 2014-12-24 ENCOUNTER — Other Ambulatory Visit: Payer: Self-pay | Admitting: Internal Medicine

## 2014-12-27 ENCOUNTER — Ambulatory Visit: Payer: BLUE CROSS/BLUE SHIELD | Admitting: Internal Medicine

## 2014-12-28 ENCOUNTER — Other Ambulatory Visit: Payer: Self-pay | Admitting: *Deleted

## 2014-12-28 ENCOUNTER — Ambulatory Visit: Payer: BLUE CROSS/BLUE SHIELD | Admitting: Internal Medicine

## 2014-12-28 MED ORDER — V-GO 40 KIT: PACK | Status: DC

## 2014-12-28 MED ORDER — INSULIN LISPRO 100 UNIT/ML ~~LOC~~ SOLN: 76.0000 [IU] | Freq: Once | SUBCUTANEOUS | Status: DC

## 2015-02-22 ENCOUNTER — Ambulatory Visit (INDEPENDENT_AMBULATORY_CARE_PROVIDER_SITE_OTHER): Payer: BLUE CROSS/BLUE SHIELD | Admitting: Internal Medicine

## 2015-02-22 ENCOUNTER — Encounter: Payer: Self-pay | Admitting: Internal Medicine

## 2015-02-22 ENCOUNTER — Telehealth: Payer: Self-pay | Admitting: Internal Medicine

## 2015-02-22 VITALS — BP 116/68 | HR 95 | Temp 98.7°F | Resp 12 | Wt 222.0 lb

## 2015-02-22 DIAGNOSIS — E1151 Type 2 diabetes mellitus with diabetic peripheral angiopathy without gangrene: Secondary | ICD-10-CM | POA: Diagnosis not present

## 2015-02-22 DIAGNOSIS — E1165 Type 2 diabetes mellitus with hyperglycemia: Secondary | ICD-10-CM

## 2015-02-22 DIAGNOSIS — IMO0002 Reserved for concepts with insufficient information to code with codable children: Secondary | ICD-10-CM

## 2015-02-22 LAB — LIPID PANEL
CHOL/HDL RATIO: 5
CHOLESTEROL: 195 mg/dL (ref 0–200)
HDL: 42.5 mg/dL (ref >39.00)
NONHDL: 152.77
Triglycerides: 211 mg/dL — ABNORMAL HIGH (ref 0.0–149.0)
VLDL: 42.2 mg/dL — AB (ref 0.0–40.0)

## 2015-02-22 LAB — HEMOGLOBIN A1C: Hgb A1c MFr Bld: 8.5 % — ABNORMAL HIGH (ref 4.6–6.5)

## 2015-02-22 LAB — LDL CHOLESTEROL, DIRECT: LDL DIRECT: 125 mg/dL

## 2015-02-22 NOTE — Patient Instructions (Signed)
Please continue: - Metformin ER 1000 mg 2x a day - Victoza 1.8 mg daily  - Glipizide XL 10 mg in am   Please continue VGo 40 increase: - Breakfast: 3-4 pushes - Lunch: 5 pushes >> 6 >> 7  - Dinner: 7 pushes - Snacks: 2 pushes  Please let me know if the sugars are consistently <80 or >200.  Please stop at the lab.  Please come back for a follow-up appointment in 3 months.

## 2015-02-22 NOTE — Progress Notes (Signed)
Patient ID: Kristin Mathews, female   DOB: August 08, 1970, 45 y.o.   MRN: [id number]  HPI: Kristin Mathews is a 45 y.o.-year-old female, returning for f/u for DM2, dx 2009, insulin-dependent since 2009, uncontrolled, with complications. Last visit 4 mo ago.  She just came back from [place] 2 weeks ago (was there for 2.5 weeks  - forgot insulin at home). Since she cam back: 50,    Last hemoglobin A1c was: Lab Results  Component Value Date   HGBA1C 8.2 08/03/2014   HGBA1C 8.6* 04/30/2014   HGBA1C 9.3* 11/02/2013   She uses frequent steroid courses for MS.  Pt was on a regimen of: - Metformin ER 1000 mg 2x a day - Levemir 42 >> 35 units at night - Victoza 1.8 mg daily  - Glipizide XL 10 mg in am  - added 10/2013 - Humalog - added 07/2014: - 15 units with a smaller dinner - 18 units with a larger dinner  After last visit, she started a VGo 40 >> she likes it: - Bolus 7-9 pushes before lunch and dinner. - Metformin ER 1000 mg 2x a day - Victoza 1.8 mg daily  - Glipizide XL 10 mg in am   She was on Invokana 100 >> 300 >> had few yeast infections - stopped in 05/2013.  We stopped Humulin 70/30 25 units bid. Januvia - did not help.  Glumetza not covered by insurance.  Pt checks her sugars 0-1x a day (forgot log): - am: 107-231 >> 110-120 (150) >> 111-155 >> 83-122 >> 104-145 >> 84-129,153 >> 86-147 >> 50s, 77-130  - 2h after b'fast: 197 >> n/c >> 127 >> 148 >> 147-193 >> 166 >> n/c - lunch: 136-236 >> 150-200 >> 105-187, 271 >> 105-145, 157 >> n/c >> 128-149 >> 83-134, 160 >> 130s - 2h after lunch: 189-257 >> n/c >> 240 >> n/c >> 105-194, 237, 295 >> 169-206, 273 >> n/c - dinner: 137-222 >> n/c >> 98, 148-220 >> 115-136 >> 118-190 >> 110-139 >> 108-213, 260 >> 120-190 - 2h after dinner: 227-241 >> n/c  >> 217 >> 179-207 >> 170-230 >> 153-272 - bedtime: 156-251>> 160-210 >> 147, 275-316 >> 177-219 >> 207 >> 181-280 >> 128-243 >> 150s No lows; she has hypoglycemia awareness at 50s. Highest  200s.  Pt's meals are: - Breakfast: yoghurt + granola, cereals (3-4 pushes) - Lunch: sandwich, sushi, [place] (5 pushes) - Dinner: home cooked meal: stews, etc.(7 pushes) - Snacks: 3-4 (2 pushes)   - no CKD, last BUN/creatinine:  Lab Results  Component Value Date   BUN 12 04/30/2014   CREATININE 0.73 04/30/2014  Was on benazepril. Angioedema from ACEI 12/19/2012! - last set of lipids: Lab Results  Component Value Date   CHOL 179 11/02/2013   HDL 32.70* 11/02/2013   LDLCALC 105* 11/12/2012   LDLDIRECT 109.2 11/02/2013   TRIG 222.0* 11/02/2013   CHOLHDL 5 11/02/2013  Not on a statin.  - last eye exam was 09/2014.  No DR.  - mild numbness and tingling in her feet.  She also has a history of MS (neuro Dr Arlice Colt) - last 2 atacks: 02/2012, 2010; also HTN, borderline HL, fatty liver, endometriosis.  I reviewed pt's medications, allergies, PMH, social hx, family hx, and changes were documented in the history of present illness. Otherwise, unchanged from my initial visit note.  ROS: Constitutional: no weight gain, + fatigue, no subjective hyperthermia Eyes: no blurry vision, no xerophthalmia ENT: no sore throat, no nodules  palpated in throat, no dysphagia/odynophagia, no hoarseness Cardiovascular: no CP/SOB/palpitations/leg swelling Respiratory: no cough/SOB Gastrointestinal: no N/V/D/C Musculoskeletal: no muscle aches/no joint aches Skin: no rashes, no hair loss Neurological: no tremors/numbness/tingling/dizziness  PE: BP 116/68 mmHg  Pulse 95  Temp(Src) 98.7 F (37.1 C) (Oral)  Resp 12  Wt 222 lb (100.699 kg)  SpO2 96% Body mass index is 34.76 kg/(m^2). Wt Readings from Last 3 Encounters:  02/22/15 222 lb (100.699 kg)  10/21/14 218 lb 12.8 oz (99.247 kg)  08/03/14 218 lb (98.884 kg)   Constitutional: overweight, in NAD Eyes: PERRLA, EOMI, no exophthalmos ENT: moist mucous membranes, no thyromegaly, no cervical lymphadenopathy Cardiovascular: tachycardia, RR, No  MRG Respiratory: CTA B Gastrointestinal: abdomen soft, NT, ND, BS+ Musculoskeletal: no deformities, strength intact in all 4 Skin: moist, warm, no rashes Neurological: no tremor with outstretched hands  ASSESSMENT: 1. DM2, insulin-dependent, uncontrolled, without complications  PLAN:  1. Patient with a h/o DM2 (exacerbated by steroid use for MS), with high sugars after lunch but improved after adding the VGo. Will increase insulin with lunch. She had 1 low in am >> advised her to let me know if this happens again >> will need to switch then to a VGo 30. -  I advised her to:  Patient Instructions  Please continue: - Metformin ER 1000 mg 2x a day - Victoza 1.8 mg daily  - Glipizide XL 10 mg in am   Please continue VGo 40 increase: - Breakfast: 3-4 pushes - Lunch: 5 pushes >> 6 >> 7  - Dinner: 7 pushes - Snacks: 2 pushes  Please let me know if the sugars are consistently <80 or >200.  Please stop at the lab.  Please come back for a follow-up appointment in 3 months.  - continue checking sugars at different times of the day - check 2-3 times a day, rotating checks  - UTD with eye exams - check HbA1c and Lipids today - Return to clinic in 65mo with sugar log   Component     Latest Ref Rng 02/22/2015  Cholesterol     0 - 200 mg/dL 195  Triglycerides     0.0 - 149.0 mg/dL 211.0 (H)  HDL Cholesterol     >39.00 mg/dL 42.50  VLDL     0.0 - 40.0 mg/dL 42.2 (H)  Total CHOL/HDL Ratio      5  NonHDL      152.77  Hemoglobin A1C     4.6 - 6.5 % 8.5 (H)  Direct LDL      125.0  LDL and HbA1c higher... Possibly b/c of being w/o the insulin for almost 3 weeks recently. Please see plan above. Will need to discuss about adding a statin  - at next visit. In the meantime >> work on diet and exercise.

## 2015-03-11 ENCOUNTER — Other Ambulatory Visit: Payer: Self-pay | Admitting: Family Medicine

## 2015-03-25 ENCOUNTER — Other Ambulatory Visit: Payer: Self-pay | Admitting: Internal Medicine

## 2015-04-04 ENCOUNTER — Telehealth: Payer: Self-pay | Admitting: Internal Medicine

## 2015-04-04 NOTE — Telephone Encounter (Signed)
Spoke to [place] at [facility] and was advised that the Metformin ordered will need a Prior authorization. [place] stated that they have Metformin ER 500 mg and 750 mg tablets and wants to know if the script can be changed to either of one of these? [place] requested a call back ASAP.

## 2015-04-04 NOTE — Telephone Encounter (Signed)
Pharmacy Tech from [facility] called regarding Pt's Metformin Rx that is not covered under her preferred list and needs to speak with someone about changing this. She said it is time sensative. CB# [phone] PT reference # [id number]

## 2015-04-04 NOTE — Telephone Encounter (Signed)
Absolutely - can use Metformin ER 500 mg

## 2015-04-05 MED ORDER — METFORMIN HCL ER 500 MG PO TB24: 1000.0000 mg | ORAL_TABLET | Freq: Two times a day (BID) | ORAL | Status: DC

## 2015-04-05 NOTE — Addendum Note (Signed)
Addended by: Rockie Neighbours B on: 04/05/2015 02:53 PM   Modules accepted: Orders, Medications

## 2015-04-05 NOTE — Telephone Encounter (Signed)
Called [facility] and advised them per Dr Arman Filter note. New rx sent to [facility] with the correct dosage of Metformin.

## 2015-04-23 ENCOUNTER — Other Ambulatory Visit: Payer: Self-pay | Admitting: Internal Medicine

## 2015-04-25 ENCOUNTER — Encounter: Payer: Self-pay | Admitting: *Deleted

## 2015-04-25 ENCOUNTER — Other Ambulatory Visit: Payer: Self-pay | Admitting: *Deleted

## 2015-04-30 ENCOUNTER — Other Ambulatory Visit: Payer: Self-pay | Admitting: Internal Medicine

## 2015-05-11 ENCOUNTER — Ambulatory Visit (INDEPENDENT_AMBULATORY_CARE_PROVIDER_SITE_OTHER): Payer: BLUE CROSS/BLUE SHIELD | Admitting: Family Medicine

## 2015-05-11 ENCOUNTER — Other Ambulatory Visit: Payer: Self-pay | Admitting: Internal Medicine

## 2015-05-11 ENCOUNTER — Other Ambulatory Visit: Payer: Self-pay | Admitting: Family Medicine

## 2015-05-11 ENCOUNTER — Encounter: Payer: Self-pay | Admitting: Family Medicine

## 2015-05-11 VITALS — BP 130/78 | HR 86 | Temp 98.1°F | Ht 66.0 in | Wt 228.0 lb

## 2015-05-11 DIAGNOSIS — E785 Hyperlipidemia, unspecified: Secondary | ICD-10-CM

## 2015-05-11 DIAGNOSIS — Z20828 Contact with and (suspected) exposure to other viral communicable diseases: Secondary | ICD-10-CM | POA: Diagnosis not present

## 2015-05-11 DIAGNOSIS — I10 Essential (primary) hypertension: Secondary | ICD-10-CM

## 2015-05-11 DIAGNOSIS — E1151 Type 2 diabetes mellitus with diabetic peripheral angiopathy without gangrene: Secondary | ICD-10-CM

## 2015-05-11 DIAGNOSIS — IMO0002 Reserved for concepts with insufficient information to code with codable children: Secondary | ICD-10-CM

## 2015-05-11 DIAGNOSIS — E1165 Type 2 diabetes mellitus with hyperglycemia: Secondary | ICD-10-CM

## 2015-05-11 MED ORDER — PRAVASTATIN SODIUM 20 MG PO TABS: 20.0000 mg | ORAL_TABLET | Freq: Every day | ORAL | Status: DC

## 2015-05-11 NOTE — Assessment & Plan Note (Signed)
S:  controlled on amlodipine 10mg , coreg 6.25mg  BID BP Readings from Last 3 Encounters:  05/11/15 130/78  02/22/15 116/68  10/21/14 124/88  A/P:Continue current meds, doing well

## 2015-05-11 NOTE — Assessment & Plan Note (Signed)
S: follows with endocrine- on glipizide, humalog, metformin, victoza Exercise and diet- really struggles to exercise, working on cutting refined sugars  Lab Results  Component Value Date   HGBA1C 8.5* 02/22/2015   HGBA1C 8.2 08/03/2014   HGBA1C 8.6* 04/30/2014   A/P: manged by endocrine but we discussed some healthy lifestyle changes (seems to have low motivation due to work stressors and issues with MS). Also updated ofot exam

## 2015-05-11 NOTE — Progress Notes (Signed)
Phone: [phone]  Subjective:  Patient presents today for their annual physical. Chief complaint-noted.   See problem oriented charting- ROS- full  review of systems was completed and negative except for: some muscle involuntarily movements at times, some numbness and tingling in extremities. No chest pain or shortness of breath. No headache or blurry vision.   The following were reviewed and entered/updated in epic: Past Medical History  Diagnosis Date  . Diabetes mellitus     Type 2  . Hypertension   . Multiple sclerosis (HCC)   . Endometriosis   . Depression   . Type II or unspecified type diabetes mellitus with peripheral circulatory disorders, uncontrolled(250.72) 11/27/2012   Patient Active Problem List   Diagnosis Date Noted  . Uncontrolled type 2 diabetes mellitus with peripheral circulatory disorder (HCC) 11/27/2012    Priority: High  . Multiple sclerosis (HCC) 07/05/2006    Priority: High  . Depression 02/25/2008    Priority: Medium  . Hyperlipidemia 08/29/2006    Priority: Medium  . Essential hypertension 07/05/2006    Priority: Medium  . Allergic rhinitis 04/27/2014    Priority: Low  . Low back pain 04/27/2014    Priority: Low  . Endometriosis 07/05/2006    Priority: Low   Past Surgical History  Procedure Laterality Date  . Lumbar disc surgery      LS spine 2005 or so  . Wisdom tooth extraction    . Exploratory laparotomy      endometriosis    Family History  Problem Relation Age of Onset  . Breast cancer Mother     early 22s  . Diabetes Mother     maternal grandmother as well  . Diabetes Father   . Other Father     progressive supranuclear palsy    Medications- reviewed and updated Current Outpatient Prescriptions  Medication Sig Dispense Refill  . amLODipine (NORVASC) 10 MG tablet TAKE 1 TABLET DAILY 90 tablet 3  . carvedilol (COREG) 6.25 MG tablet TAKE 1 TABLET BY MOUTH TWICE DAILY WITH A MEAL 180 tablet 3  . cetirizine (ZYRTEC) 10 MG  tablet Take 10 mg by mouth every evening.     . DULoxetine (CYMBALTA) 60 MG capsule TAKE 1 CAPSULE DAILY 90 capsule 1  . FREESTYLE TEST STRIPS test strip TEST BLOOD SUGAR TWICE A DAY. VARY TIMES AS DIRECTED. 200 each 3  . GLIPIZIDE XL 10 MG 24 hr tablet TAKE 1 TABLET DAILY WITH BREAKFAST 90 tablet 1  . HUMALOG 100 UNIT/ML injection USE WITH VGO 40 TO INJECT 76 UNITS UNDER THE SKIN ONCE AS DIRECTED 70 mL 0  . Insulin Disposable Pump (V-GO 40) KIT USE DAILY 90 kit 0  . Insulin Pen Needle (BD PEN NEEDLE NANO U/F) 32G X 4 MM MISC Use 2x a day 200 each 3  . levonorgestrel-ethinyl estradiol (LYBREL,AMETHYST) 90-20 MCG tablet Take 1 tablet by mouth daily.     . metFORMIN (GLUCOPHAGE XR) 500 MG 24 hr tablet Take 2 tablets (1,000 mg total) by mouth 2 (two) times daily with a meal. 360 tablet 1  . modafinil (PROVIGIL) 200 MG tablet Take 1 tablet (200 mg total) by mouth daily as needed. 90 tablet 1  . Naproxen Sodium 220 MG CAPS Take 440 mg by mouth 2 (two) times daily as needed (for pain.).     Marland Kitchen natalizumab (TYSABRI) 300 MG/15ML injection Inject 300 mg into the vein every 30 (thirty) days.    Marland Kitchen VICTOZA 18 MG/3ML SOPN INJECT 1.8 MG INTO THE  SKIN DAILY AS DIRECTED 27 mL 1  . [DISCONTINUED] amLODipine-benazepril (LOTREL) 10-20 MG per capsule Take 1 capsule by mouth daily. 90 capsule 0   No current facility-administered medications for this visit.    Allergies-reviewed and updated Allergies  Allergen Reactions  . Ace Inhibitors Swelling    Edema    Social History   Social History  . Marital Status: Single    Spouse Name: N/A  . Number of Children: N/A  . Years of Education: N/A   Social History Main Topics  . Smoking status: Never Smoker   . Smokeless tobacco: None  . Alcohol Use: 0.0 oz/week    0 Standard drinks or equivalent per week     Comment: occasional  . Drug Use: No  . Sexual Activity: Not Asked   Other Topics Concern  . None   Social History Narrative   Single. Lives alone.  No children. Pet cat.       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 Oropharynx normal Neck: no thyromegaly CV: RRR no murmurs rubs or gallops Lungs: CTAB no crackles, wheeze, rhonchi Abdomen: soft/nontender/nondistended/normal bowel sounds. No rebound or guarding. obese Ext: no edema Skin: warm, dry Neuro: grossly normal, moves all extremities, PERRLA  Diabetic Foot Exam - Simple   Simple Foot Form  Diabetic Foot exam was performed with the following findings:  Yes 05/11/2015  9:29 AM  Visual Inspection  No deformities, no ulcerations, no other skin breakdown bilaterally:  Yes  Sensation Testing  Intact to touch and monofilament testing bilaterally:  Yes  Pulse Check  Posterior Tibialis and Dorsalis pulse intact bilaterally:  Yes  Comments     Assessment/Plan:  45 y.o. Kristin Mathews presenting for annual physical.  Health Maintenance counseling: 1. Anticipatory guidance: Patient counseled regarding regular dental exams, eye exams, wearing seatbelts.  2. Risk factor reduction:  Advised patient of need for regular exercise (high stress at work, little time for self- works on it as able) and diet rich and fruits and vegetables to reduce risk of heart attack and stroke. Eats large portions of good foods but also some bad foods.  3. Immunizations/screenings/ancillary studies Immunization History  Administered Date(s) Administered  . Influenza Whole 10/14/2008  . Influenza,inj,Quad PF,36+ Mos 11/02/2013  . Pneumococcal Polysaccharide-23 01/16/2007  . Tdap 01/16/2007   Health Maintenance Due  Topic Date Due  . HIV Screening - next labs 07/30/1984  .  FOOT EXAM - normal today 05/10/2015   4. Cervical cancer screening- up to date-will get copy of most recent pap 5. Breast cancer screening-  breast exam through GYN and mammogram through mobile unit that come her to her work 11/2014 was normal. Will get next one sent to [place] 6. Colon cancer screening - start at age 64 for screening, had early one in 2010 for urgency issues  MS-[facility]s [facility]- on tysabri and provigil. On cymbalta for depression through Dr. Felecia Shelling as well. Some flexing of muscles involuntarily that she wasn't before but otherwise.   We completed CPE elements in addition to below but patient had CPE with  gyn 2 weeks ago, will code as problem oriented visit primarily  Hyperlipidemia S: poorly controlled on no statin. No myalgias.  Lab Results  Component Value Date   CHOL 195 02/22/2015   HDL 42.50 02/22/2015   LDLCALC 105* 11/12/2012   LDLDIRECT 125.0 02/22/2015   TRIG 211.0* 02/22/2015   CHOLHDL 5 02/22/2015   A/P: start pravastatin 56m. Follow up LDL in 8 weeks, may need bump in dose but patient wants lowest effective dose to get LDL at least under 100    Essential hypertension S:  controlled on amlodipine 182m coreg 6.2513mID BP Readings from Last 3 Encounters:  05/11/15 130/78  02/22/15 116/68  10/21/14 124/88  A/P:Continue current meds, doing well    Uncontrolled type 2 diabetes mellitus with peripheral circulatory disorder S: follows with endocrine- on glipizide, humalog, metformin, victoza Exercise and diet- really struggles to exercise, working on cutting refined sugars  Lab Results  Component Value Date   HGBA1C 8.5* 02/22/2015   HGBA1C 8.2 08/03/2014   HGBA1C 8.6* 04/30/2014   A/P: manged by endocrine but we discussed some healthy lifestyle changes (seems to have low motivation due to work stressors and issues with MS). Also updated ofot exam     Return in about 6 months (around 11/10/2015) for follow up- or sooner if  needed. 1 year follow up at latest. Return precautions advised.   Orders Placed This Encounter  Procedures  . LDL cholesterol, direct    [place]    Standing Status: Future     Number of Occurrences:      Standing Expiration Date: 05/10/2016  . HIV antibody    Standing Status: Future     Number of Occurrences:      Standing Expiration Date: 05/10/2016    Meds ordered this encounter  Medications  . pravastatin (PRAVACHOL) 20 MG tablet    Sig: Take 1 tablet (20 mg total) by mouth daily.    Dispense:  90 tablet    Refill:  3   The duration of face-to-face time during this visit was 30 minutes. Greater than 50% of this time was spent in counseling, explanation of diagnosis, planning of further management, and/or coordination of care.     SteGarret ReddishD

## 2015-05-11 NOTE — Patient Instructions (Addendum)
Ask GYn to send [place] pap results when available  Only change today- start pravastatin 20mg  for cholesterol  Schedule a lab visit at the check out desk for 2 months. Return for future fasting labs. Nothing but water after midnight please.

## 2015-05-11 NOTE — Assessment & Plan Note (Signed)
S: poorly controlled on no statin. No myalgias.  Lab Results  Component Value Date   CHOL 195 02/22/2015   HDL 42.50 02/22/2015   LDLCALC 105* 11/12/2012   LDLDIRECT 125.0 02/22/2015   TRIG 211.0* 02/22/2015   CHOLHDL 5 02/22/2015   A/P: start pravastatin 20mg . Follow up LDL in 8 weeks, may need bump in dose but patient wants lowest effective dose to get LDL at least under 100

## 2015-05-17 ENCOUNTER — Other Ambulatory Visit: Payer: Self-pay | Admitting: Family Medicine

## 2015-05-19 ENCOUNTER — Telehealth: Payer: Self-pay | Admitting: Internal Medicine

## 2015-05-19 MED ORDER — EXENATIDE ER 2 MG ~~LOC~~ PEN: 2.0000 mg | PEN_INJECTOR | SUBCUTANEOUS | Status: DC

## 2015-05-19 NOTE — Telephone Encounter (Signed)
[facility] calling back about victoza, not covered by plan and need to know if an alternative will be written.  It is time sensative. [phone] Ref# [id number]

## 2015-05-19 NOTE — Telephone Encounter (Signed)
[facility] note dated 05/18/15 at 5:32 PM Caller states [facility] is [facility] and she needs to switch a rx. REF # [id number]. Needs to change victoza to an approved alternate. Metformin HCL, byetta inj pen, glipizide, bydureon are approved.

## 2015-05-19 NOTE — Telephone Encounter (Signed)
Let's send Bydureon 2 mg sq once a week, #4 pens with 5 refills

## 2015-05-19 NOTE — Telephone Encounter (Signed)
Please advise thanks.

## 2015-05-19 NOTE — Telephone Encounter (Signed)
rx submitted.  

## 2015-05-26 ENCOUNTER — Ambulatory Visit: Payer: BLUE CROSS/BLUE SHIELD | Admitting: Internal Medicine

## 2015-06-14 ENCOUNTER — Telehealth: Payer: Self-pay | Admitting: *Deleted

## 2015-06-14 MED ORDER — MODAFINIL 200 MG PO TABS: 200.0000 mg | ORAL_TABLET | Freq: Every day | ORAL | Status: DC | PRN

## 2015-06-14 NOTE — Telephone Encounter (Signed)
Rx. faxed to [facility] per pt's request/fim

## 2015-06-15 ENCOUNTER — Encounter: Payer: Self-pay | Admitting: *Deleted

## 2015-07-21 ENCOUNTER — Ambulatory Visit: Payer: BLUE CROSS/BLUE SHIELD | Admitting: Internal Medicine

## 2015-08-03 ENCOUNTER — Encounter: Payer: Self-pay | Admitting: Adult Health

## 2015-08-03 ENCOUNTER — Ambulatory Visit (INDEPENDENT_AMBULATORY_CARE_PROVIDER_SITE_OTHER): Payer: BLUE CROSS/BLUE SHIELD | Admitting: Adult Health

## 2015-08-03 VITALS — BP 104/62 | Temp 98.1°F | Wt 227.0 lb

## 2015-08-03 DIAGNOSIS — H109 Unspecified conjunctivitis: Secondary | ICD-10-CM

## 2015-08-03 DIAGNOSIS — J0110 Acute frontal sinusitis, unspecified: Secondary | ICD-10-CM

## 2015-08-03 MED ORDER — DOXYCYCLINE HYCLATE 100 MG PO CAPS: 100.0000 mg | ORAL_CAPSULE | Freq: Two times a day (BID) | ORAL | Status: DC

## 2015-08-03 MED ORDER — HYDROCODONE-HOMATROPINE 5-1.5 MG/5ML PO SYRP: 5.0000 mL | ORAL_SOLUTION | Freq: Three times a day (TID) | ORAL | Status: DC | PRN

## 2015-08-03 NOTE — Patient Instructions (Addendum)
It was great meeting you today!  Your exam is consistent with sinusitis as well as viral conjuntivitis.   I have prescribed doxycycline ( antibioitc) and cough syrup/.   If you are not improving in the next 2 days then please start the antibiotic  Use Flonase and Mucinex to help with the symptoms as well as a allergy eye drop   Make sure you wash your hands throughout the day     Viral Conjunctivitis Viral conjunctivitis is an inflammation of the clear membrane that covers the white part of your eye and the inner surface of your eyelid (conjunctiva). The inflammation is caused by a viral infection. The blood vessels in the conjunctiva become inflamed, causing the eye to become red or pink, and often itchy. Viral conjunctivitis can easily be passed from one person to another (contagious). CAUSES  Viral conjunctivitis is caused by a virus. A virus is a type of contagious germ. It can be spread by touching objects that have been contaminated with the virus, such as doorknobs or towels.  SYMPTOMS  Symptoms of viral conjunctivitis may include:   Eye redness.  Tearing or watery eyes.  Itchy eyes.  Burning feeling in the eyes.  Clear drainage from the eye.  Swollen eyelids.  A gritty feeling in the eye.  Light sensitivity. DIAGNOSIS  Viral conjunctivitis may be diagnosed with a medical history and physical exam. If you have discharge from your eye, the discharge may be tested to rule out other causes of conjunctivitis.  TREATMENT  Viral conjunctivitis does not respond to medicines that kill bacteria (antibiotics). Treatment for viral conjunctivitis is directed at stopping a bacterial infection from developing in addition to the viral infection. Treatment also aims to relieve your symptoms, such as itching. This may be done with antihistamine drops or other eye medicines. HOME CARE INSTRUCTIONS  Take medicines only as directed by your health care provider.  Avoid touching or  rubbing your eyes.  Apply a warm, clean washcloth to your eye for 10-20 minutes, 3-4 times per day.  If you wear contact lenses, do not wear them until the inflammation is gone and your health care provider says it is safe to wear them again. Ask your health care provider how to sterilize or replace your contact lenses before using them again. Wear glasses until you can resume wearing contacts.  Avoid wearing eye makeup until the inflammation is gone. Throw away any old eye cosmetics that may be contaminated.  Change or wash your pillowcase every day.  Do not share towels or washcloths. This may spread the infection.  Wash your hands often with soap and water. Use paper towels to dry your hands.  Gently wipe away any drainage from your eye with a warm, wet washcloth or a cotton ball.  Be very careful to avoid touching the edge of the eyelid with the eye drop bottle or ointment tube when applying medicines to the affected eye. This will stop you from spreading the infection to the other eye or to other people. SEEK MEDICAL CARE IF:   Your symptoms do not improve with treatment.  You have increased pain.  Your vision becomes blurry.  You have a fever.  You have facial pain, redness, or swelling.  You have new symptoms.  Your symptoms get worse.   This information is not intended to replace advice given to you by your health care provider. Make sure you discuss any questions you have with your health care provider.  Document Released: 03/24/2002 Document Revised: 06/25/2005 Document Reviewed: 10/13/2013 Elsevier Interactive Patient Education [employer].

## 2015-08-03 NOTE — Progress Notes (Signed)
Subjective:    Patient ID: Kristin Mathews, female    DOB: November 01, 1970, 45 y.o.   MRN: [id number]  Sinusitis This is a new problem. The current episode started in the past 7 days. The problem has been gradually worsening since onset. There has been no fever. Associated symptoms include congestion, coughing (semi productive ), a hoarse voice and sinus pressure. Pertinent negatives include no headaches, shortness of breath, sore throat or swollen glands. Past treatments include oral decongestants and spray decongestants. The treatment provided no relief.  Conjunctivitis  The current episode started 3 to 5 days ago. The problem has been gradually improving. The problem is mild. Nothing relieves the symptoms. Associated symptoms include eye itching, congestion, rhinorrhea, cough (semi productive ), eye discharge (clear) and eye redness. Pertinent negatives include no photophobia, no headaches, no sore throat, no swollen glands and no eye pain. The eye pain is mild. The left eye is affected. The eye pain is not associated with movement. The eyelid exhibits no abnormality.      Review of Systems  Constitutional: Negative.   HENT: Positive for congestion, hoarse voice, postnasal drip, rhinorrhea and sinus pressure. Negative for sore throat.   Eyes: Positive for discharge (clear), redness and itching. Negative for photophobia and pain.  Respiratory: Positive for cough (semi productive ). Negative for shortness of breath.   Cardiovascular: Negative.   Neurological: Negative.  Negative for headaches.  All other systems reviewed and are negative.  Past Medical History  Diagnosis Date  . Diabetes mellitus     Type 2  . Hypertension   . Multiple sclerosis ([place])   . Endometriosis   . Depression   . Type II or unspecified type diabetes mellitus with peripheral circulatory disorders, uncontrolled(250.72) 11/27/2012    Social History   Social History  . Marital Status: Single    Spouse Name: N/A    . Number of Children: N/A  . Years of Education: N/A   Occupational History  . Not on file.   Social History Main Topics  . Smoking status: Never Smoker   . Smokeless tobacco: Not on file  . Alcohol Use: 0.0 oz/week    0 Standard drinks or equivalent per week     Comment: occasional  . Drug Use: No  . Sexual Activity: Not on file   Other Topics Concern  . Not on file   Social History Narrative   Single. Lives alone. No children. Pet cat.       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 Lumbar disc surgery      LS spine 2005 or so  . Wisdom tooth extraction    . Exploratory laparotomy      endometriosis    Family History  Problem Relation Age of Onset  . Breast cancer Mother     early 66s  . Diabetes Mother     maternal grandmother as well  . Diabetes Father   . Other Father     progressive supranuclear palsy    Allergies  Allergen Reactions  . Ace Inhibitors Swelling    Edema    Current Outpatient Prescriptions on File Prior to Visit  Medication Sig Dispense Refill  . amLODipine (NORVASC) 10 MG tablet TAKE 1 TABLET DAILY 90 tablet  3  . carvedilol (COREG) 6.25 MG tablet TAKE 1 TABLET TWICE A DAY WITH A MEAL 180 tablet 2  . cetirizine (ZYRTEC) 10 MG tablet Take 10 mg by mouth every evening.     . DULoxetine (CYMBALTA) 60 MG capsule TAKE 1 CAPSULE DAILY 90 capsule 1  . Exenatide ER (BYDUREON) 2 MG PEN Inject 2 mg into the skin once a week. 12 each 0  . FREESTYLE TEST STRIPS test strip TEST BLOOD SUGAR TWICE A DAY. VARY TIMES AS DIRECTED. 200 each 3  . GLIPIZIDE XL 10 MG 24 hr tablet TAKE 1 TABLET DAILY WITH BREAKFAST 90 tablet 1  . HUMALOG 100 UNIT/ML injection USE WITH VGO 40 TO INJECT 76 UNITS UNDER THE SKIN ONCE AS DIRECTED 70 mL 0  . Insulin Disposable Pump (V-GO 40) KIT USE  DAILY 90 kit 0  . Insulin Pen Needle (BD PEN NEEDLE NANO U/F) 32G X 4 MM MISC Use 2x a day 200 each 3  . levonorgestrel-ethinyl estradiol (LYBREL,AMETHYST) 90-20 MCG tablet Take 1 tablet by mouth daily.     . metFORMIN (GLUCOPHAGE XR) 500 MG 24 hr tablet Take 2 tablets (1,000 mg total) by mouth 2 (two) times daily with a meal. 360 tablet 1  . modafinil (PROVIGIL) 200 MG tablet Take 1 tablet (200 mg total) by mouth daily as needed. 90 tablet 1  . Naproxen Sodium 220 MG CAPS Take 440 mg by mouth 2 (two) times daily as needed (for pain.).     Marland Kitchen natalizumab (TYSABRI) 300 MG/15ML injection Inject 300 mg into the vein every 30 (thirty) days.    . pravastatin (PRAVACHOL) 20 MG tablet Take 1 tablet (20 mg total) by mouth daily. 90 tablet 3  . [DISCONTINUED] amLODipine-benazepril (LOTREL) 10-20 MG per capsule Take 1 capsule by mouth daily. 90 capsule 0   No current facility-administered medications on file prior to visit.    BP 104/62 mmHg  Temp(Src) 98.1 F (36.7 C) (Oral)  Wt 227 lb (102.967 kg)       Objective:   Physical Exam  Constitutional: She is oriented to person, place, and time. She appears well-developed and well-nourished. No distress.  HENT:  Head: Normocephalic and atraumatic.  Right Ear: Hearing, tympanic membrane, external ear and ear canal normal.  Left Ear: Hearing, tympanic membrane, external ear and ear canal normal.  Nose: Mucosal edema and rhinorrhea present. Right sinus exhibits maxillary sinus tenderness. Left sinus exhibits maxillary sinus tenderness.  Mouth/Throat: Oropharyngeal exudate present. No posterior oropharyngeal edema or posterior oropharyngeal erythema.  Eyes: EOM are normal. Pupils are equal, round, and reactive to light. Right eye exhibits discharge (watery ). Left eye exhibits discharge (watery).  Cardiovascular: Normal rate and intact distal pulses.  Exam reveals no gallop and no friction rub.   No murmur heard. Pulmonary/Chest: Effort normal and  breath sounds normal. No respiratory distress. She has no wheezes. She has no rales. She exhibits no tenderness.  Neurological: She is alert and oriented to person, place, and time.  Skin: Skin is warm and dry. No rash noted. She is not diaphoretic. No erythema. No pallor.  Psychiatric: She has a normal mood and affect. Her behavior is normal. Judgment and thought content normal.  Nursing note and vitals reviewed.     Assessment & Plan:  1. Acute frontal sinusitis, recurrence not specified -  - HYDROcodone-homatropine (HYCODAN) 5-1.5 MG/5ML syrup; Take 5 mLs by mouth every 8 (eight) hours as needed for cough.  Dispense: 120 mL; Refill: 0 -  doxycycline (VIBRAMYCIN) 100 MG capsule; Take 1 capsule (100 mg total) by mouth 2 (two) times daily.  Dispense: 14 capsule; Refill: 0 - Paper prescription given for doxycycline. Advised to wait two additional days and if no improvement then please start abx.  - Mucinex and flonase - Follow up if no improvement   2. Conjunctivitis of left eye - Appears viral  - can use allergy eye drops  - Can also do cool moist compresses - Follow up if no improvement  Dorothyann Peng, NP

## 2015-08-09 DIAGNOSIS — G35 Multiple sclerosis: Secondary | ICD-10-CM | POA: Diagnosis not present

## 2015-08-10 ENCOUNTER — Other Ambulatory Visit: Payer: Self-pay | Admitting: Internal Medicine

## 2015-08-23 ENCOUNTER — Telehealth: Payer: Self-pay | Admitting: General Practice

## 2015-08-23 NOTE — Telephone Encounter (Signed)
Please advise.  PLEASE NOTE: All timestamps contained within this report are represented as [place] Standard Time. CONFIDENTIALTY NOTICE: This fax transmission is intended only for the addressee. It contains information that is legally privileged, confidential or otherwise protected from use or disclosure. If you are not the intended recipient, you are strictly prohibited from reviewing, disclosing, copying using or disseminating any of this information or taking any action in reliance on or regarding this information. If you have received this fax in error, please notify us immediately by telephone so that we can arrange for its return to [place]. Phone: [phone], Toll-Free: [phone], Fax: [phone] Page: 1 of 1 Call Id: [id number] [facility] - [facility] [place] Patient Name: Kristin Mathews Gender: Female DOB: 12/13/1970 Age: 45 Y 22 D Return Phone Number: [phone] (Primary), [phone] (Secondary) Address: City/State/Zip: [place] Client [facility] - Client [facility] - Day Physician Garret Reddish - MD Contact Type Call Who Is Calling Patient / Member / Family / Caregiver Call Type Triage / Clinical Relationship To Patient Self Return Phone Number [phone] (Secondary) Chief Complaint Cough Reason for Call Symptomatic / Request for [place] states was out of the country and got sick. Returned home and went to the doctor. Was prescribed an antibiotic. Got 95% better. Going out of the country again next week and starting to get sick again (cough and raspy voice). Wants to know if there is anything preventative she can do or if something can be called in. Appointment Disposition EMR Caller Not Reached Info pasted into Epic No Translation No Nurse Assessment Guidelines Guideline Title Affirmed Question Affirmed  Notes Nurse Date/Time (Eastern Time) Disp. Time Eilene Ghazi Time) Disposition Final User 08/22/2015 5:08:42 PM Attempt made - message left Lillia Corporal, RN, Lenell Antu 08/22/2015 6:01:48 PM FINAL ATTEMPT MADE - no message left Yes Lillia Corporal, RN, Lenell Antu

## 2015-08-23 NOTE — Telephone Encounter (Signed)
Best prevention- good handwashing, lots of rest, stay hydrated  Beyond that- she could be seen if she wants to be evaluated for current symptoms

## 2015-08-24 NOTE — Telephone Encounter (Signed)
Best prevention- good handwashing, lots of rest, stay hydrated  Beyond that- she could be seen if she wants to be evaluated for current symptoms

## 2015-08-25 NOTE — Telephone Encounter (Signed)
Called patient and left a voicemail message for return phone call.

## 2015-09-07 ENCOUNTER — Other Ambulatory Visit: Payer: Self-pay | Admitting: Family Medicine

## 2015-09-12 ENCOUNTER — Other Ambulatory Visit: Payer: Self-pay | Admitting: Neurology

## 2015-09-12 ENCOUNTER — Other Ambulatory Visit (INDEPENDENT_AMBULATORY_CARE_PROVIDER_SITE_OTHER): Payer: Self-pay

## 2015-09-12 DIAGNOSIS — Z0289 Encounter for other administrative examinations: Secondary | ICD-10-CM

## 2015-09-12 DIAGNOSIS — G35 Multiple sclerosis: Secondary | ICD-10-CM

## 2015-09-13 LAB — CBC WITH DIFFERENTIAL/PLATELET
BASOS: 1 %
Basophils Absolute: 0.1 10*3/uL (ref 0.0–0.2)
EOS (ABSOLUTE): 0.5 10*3/uL — AB (ref 0.0–0.4)
EOS: 4 %
HEMATOCRIT: 44.9 % (ref 34.0–46.6)
Hemoglobin: 14.8 g/dL (ref 11.1–15.9)
IMMATURE GRANULOCYTES: 0 %
Immature Grans (Abs): 0 10*3/uL (ref 0.0–0.1)
Lymphocytes Absolute: 5.2 10*3/uL — ABNORMAL HIGH (ref 0.7–3.1)
Lymphs: 37 %
MCH: 28.5 pg (ref 26.6–33.0)
MCHC: 33 g/dL (ref 31.5–35.7)
MCV: 87 fL (ref 79–97)
MONOS ABS: 0.6 10*3/uL (ref 0.1–0.9)
Monocytes: 4 %
NEUTROS ABS: 7.7 10*3/uL — AB (ref 1.4–7.0)
Neutrophils: 54 %
PLATELETS: 364 10*3/uL (ref 150–379)
RBC: 5.19 x10E6/uL (ref 3.77–5.28)
RDW: 14.4 % (ref 12.3–15.4)
WBC: 14.1 10*3/uL — ABNORMAL HIGH (ref 3.4–10.8)

## 2015-09-15 ENCOUNTER — Other Ambulatory Visit: Payer: Self-pay | Admitting: Internal Medicine

## 2015-09-20 ENCOUNTER — Encounter: Payer: Self-pay | Admitting: *Deleted

## 2015-09-26 ENCOUNTER — Encounter: Payer: Self-pay | Admitting: Internal Medicine

## 2015-09-26 ENCOUNTER — Ambulatory Visit (INDEPENDENT_AMBULATORY_CARE_PROVIDER_SITE_OTHER): Payer: BLUE CROSS/BLUE SHIELD | Admitting: Internal Medicine

## 2015-09-26 VITALS — BP 138/90 | HR 94 | Ht 66.5 in | Wt 230.0 lb

## 2015-09-26 DIAGNOSIS — E1151 Type 2 diabetes mellitus with diabetic peripheral angiopathy without gangrene: Secondary | ICD-10-CM

## 2015-09-26 DIAGNOSIS — IMO0002 Reserved for concepts with insufficient information to code with codable children: Secondary | ICD-10-CM

## 2015-09-26 DIAGNOSIS — E1165 Type 2 diabetes mellitus with hyperglycemia: Secondary | ICD-10-CM

## 2015-09-26 LAB — POCT GLYCOSYLATED HEMOGLOBIN (HGB A1C): HEMOGLOBIN A1C: 9

## 2015-09-26 MED ORDER — CARVEDILOL 6.25 MG PO TABS: ORAL_TABLET | ORAL | 2 refills | Status: DC

## 2015-09-26 MED ORDER — CANAGLIFLOZIN 100 MG PO TABS: 100.0000 mg | ORAL_TABLET | Freq: Every day | ORAL | 2 refills | Status: DC

## 2015-09-26 NOTE — Progress Notes (Signed)
Patient ID: Kristin Mathews, female   DOB: 1970/12/07, 45 y.o.   MRN: [id number]  HPI: Kristin Mathews is a 45 y.o.-year-old female, returning for f/u for DM2, dx 2009, insulin-dependent since 2009, uncontrolled, without long term complications. Last visit 7 mo ago.  She traveled a lot for work this summer: [place], [place], and will go to [place] next month/.   Last hemoglobin A1c was: Lab Results  Component Value Date   HGBA1C 9.0 09/26/2015   HGBA1C 8.5 (H) 02/22/2015   HGBA1C 8.2 08/03/2014   She uses frequent steroid courses for MS.  Pt was on a regimen of: - Metformin ER 1000 mg 2x a day - Levemir 42 >> 35 units at night - Victoza 1.8 mg daily  - Glipizide XL 10 mg in am  - added 10/2013 - Humalog - added 07/2014: - 15 units with a smaller dinner - 18 units with a larger dinner  Now on a VGo 40 >> she likes it: - Metformin ER 1000 mg 2x a day - Bydureon 2 mg weekly - Glipizide XL 10 mg in am  - VGo 40  - Breakfast: 3-4 pushes - Lunch: 7-10 pushes - Dinner: 7-10 pushes - Snacks: 1-2 pushes  She was on Invokana 100 >> 300 >> had few yeast infections - stopped in 05/2013.  We stopped Humulin 70/30 25 units bid. Januvia - did not help.  Glumetza not covered by insurance.  Pt checks her sugars 0-1x a day (forgot log): - am: 110-120 (150) >> 111-155 >> 83-122 >> 104-145 >> 84-129,153 >> 86-147 >> 50s, 77-130 >> 64-117, 150, 163 - 2h after b'fast: 197 >> n/c >> 127 >> 148 >> 147-193 >> 166 >> n/c - lunch: 150-200 >> 105-187, 271 >> 105-145, 157 >> n/c >> 128-149 >> 83-134, 160 >> 130s >> n/c - 2h after lunch: 189-257 >> n/c >> 240 >> n/c >> 105-194, 237, 295 >> 169-206, 273 >> n/c  - dinner: 98, 148-220 >> 115-136 >> 118-190 >> 110-139 >> 108-213, 260 >> 120-190 >> 116, 139, 179, 237 - 2h after dinner: 227-241 >> n/c  >> 217 >> 179-207 >> 170-230 >> 153-272 >> 165 - bedtime: 156-251>> 160-210 >> 147, 275-316 >> 177-219 >> 207 >> 181-280 >> 128-243 >> 150s >> 122-246 + lows >>  21!! (at night), 64.(injected too much insulin with dinner); she has hypoglycemia awareness at 50s. Highest 200s >> 246.  Pt's meals are: - Breakfast: yoghurt + granola, cereals (3-4 pushes) - Lunch: sandwich, sushi, [place] (5 pushes) - Dinner: home cooked meal: stews, etc.(7 pushes) - Snacks: 3-4 (2 pushes)   - no CKD, last BUN/creatinine:  Lab Results  Component Value Date   BUN 12 04/30/2014   CREATININE 0.73 04/30/2014  Was on benazepril. Angioedema from ACEI 12/19/2012! - last set of lipids: Lab Results  Component Value Date   CHOL 195 02/22/2015   HDL 42.50 02/22/2015   LDLCALC 105 (H) 11/12/2012   LDLDIRECT 125.0 02/22/2015   TRIG 211.0 (H) 02/22/2015   CHOLHDL 5 02/22/2015  Started a statin: Pravachol 20 mg. - last eye exam was 09/2014.  No DR.  - mild numbness and tingling in her feet. Last foot exam normal 04/2015.  She also has a history of MS (neuro Dr Arlice Colt) - last 2 atacks: 02/2012, 2010; also HTN, borderline HL, fatty liver, endometriosis.  I reviewed pt's medications, allergies, PMH, social hx, family hx, and changes were documented in the history of present illness. Otherwise,  unchanged from my initial visit note.  ROS: Constitutional: no weight gain, no fatigue, no subjective hyperthermia Eyes: no blurry vision, no xerophthalmia ENT: no sore throat, no nodules palpated in throat, no dysphagia/odynophagia, no hoarseness Cardiovascular: no CP/SOB/palpitations/leg swelling Respiratory: no cough/SOB Gastrointestinal: no N/V/D/C Musculoskeletal: no muscle aches/no joint aches Skin: no rashes, no hair loss Neurological: no tremors/numbness/tingling/dizziness  PE: Ht 5' 6.5" (1.689 m)   Wt 230 lb (104.3 kg)   BMI 36.57 kg/m  Body mass index is 36.57 kg/m. Wt Readings from Last 3 Encounters:  09/26/15 230 lb (104.3 kg)  08/03/15 227 lb (103 kg)  05/11/15 228 lb (103.4 kg)   Constitutional: overweight, in NAD Eyes: PERRLA, EOMI, no  exophthalmos ENT: moist mucous membranes, no thyromegaly, no cervical lymphadenopathy Cardiovascular: RRR, No MRG Respiratory: CTA B Gastrointestinal: abdomen soft, NT, ND, BS+ Musculoskeletal: no deformities, strength intact in all 4 Skin: moist, warm, no rashes Neurological: no tremor with outstretched hands  ASSESSMENT: 1. DM2, insulin-dependent, uncontrolled, with hyperglycemia  PLAN:  1. Patient with a h/o DM2 (exacerbated by steroid use for MS), with very fluctuating sugars, higher later in the day. Her HbA1c is 9.0% (higher) but this is not reflected in the CBGs in her meter. Based on her home sugars, de to the variability, we cannot change the insulin doses. I suggested to retry Invokana (may need terconazole cream if she develops a new yeast inf) but if she cannot continue Invokana, will need a PA to get back Victoza, which was working much better for he than Bydureon. I also suggested that she started a regula insulin pump rather than the VGo. Will refer her to DM education for a more in depth discussion about pumps.  -  I advised her to:   Patient Instructions  Please continue: - Metformin ER 1000 mg 2x a day - Bydureon 2 mg weekly - Glipizide XL 10 mg in am  - VGo 40  - Breakfast: 3-4 pushes - Lunch: 7 pushes - Dinner: 7 pushes - Snacks: 2 pushes  Please restart Invokana 100 mg daily in am.  Please schedule an appt with Leonia Reader for a discussion about pumps.  Please come back for a follow-up appointment in 2 months.  - continue checking sugars at different times of the day - check 2-3 times a day, rotating checks  - UTD with eye exams - check HbA1c today >> 9.0% (higher!) - Return to clinic in 2 mo with sugar log   Kristin Kingdom, MD PhD [facility] Endocrin[facility]

## 2015-09-26 NOTE — Patient Instructions (Addendum)
Patient Instructions  Please continue: - Metformin ER 1000 mg 2x a day - Bydureon 2 mg weekly - Glipizide XL 10 mg in am  - VGo 40  - Breakfast: 3-4 pushes - Lunch: 7 pushes - Dinner: 7 pushes - Snacks: 2 pushes  Please restart Invokana 100 mg daily in am.  Please schedule an appt with Leonia Reader for a discussion about pumps.  Please come back for a follow-up appointment in 2 months.

## 2015-09-27 ENCOUNTER — Encounter: Payer: BLUE CROSS/BLUE SHIELD | Attending: Internal Medicine | Admitting: Nutrition

## 2015-09-27 DIAGNOSIS — E1151 Type 2 diabetes mellitus with diabetic peripheral angiopathy without gangrene: Secondary | ICD-10-CM

## 2015-09-27 DIAGNOSIS — IMO0002 Reserved for concepts with insufficient information to code with codable children: Secondary | ICD-10-CM

## 2015-09-27 DIAGNOSIS — E1165 Type 2 diabetes mellitus with hyperglycemia: Principal | ICD-10-CM

## 2015-09-27 NOTE — Assessment & Plan Note (Signed)
Patient is here today to learn about insulin pump therapy.  We discussed how the insulin pump works, compared to her V-go.  We discussed the advantages and disadvantages of pump therapy.  She was shown the different models and their respective differences.  She has chosen the Medtronic pump and has filled out the paperwork to check her insurance payment for this.  She was given the brochure to review, and encouraged to go on line to see how this works.  She was told to call if questions.   I faxed in the paperwork and she was told to call me when she gets her pump in the mail.  We will arrange for an appt. To begin this.  She had no final questions.

## 2015-10-01 ENCOUNTER — Other Ambulatory Visit: Payer: Self-pay | Admitting: Internal Medicine

## 2015-10-03 ENCOUNTER — Other Ambulatory Visit: Payer: Self-pay

## 2015-10-03 MED ORDER — INSULIN LISPRO 100 UNIT/ML ~~LOC~~ SOLN: SUBCUTANEOUS | 0 refills | Status: DC

## 2015-10-21 ENCOUNTER — Encounter: Payer: Self-pay | Admitting: *Deleted

## 2015-10-24 ENCOUNTER — Other Ambulatory Visit: Payer: Self-pay | Admitting: Internal Medicine

## 2015-10-31 ENCOUNTER — Other Ambulatory Visit: Payer: Self-pay | Admitting: Internal Medicine

## 2015-11-03 ENCOUNTER — Telehealth: Payer: Self-pay

## 2015-11-03 NOTE — Telephone Encounter (Signed)
Called and left message, advising of pump not being covered, and I needed to give her the information to Roxann, the medtronic rep; so she can discuss maybe getting an older pump ordered instead of the [id number] pump that was denied. Gave call back number to office.

## 2015-11-10 ENCOUNTER — Telehealth: Payer: Self-pay | Admitting: Family Medicine

## 2015-11-10 ENCOUNTER — Other Ambulatory Visit: Payer: Self-pay | Admitting: Family Medicine

## 2015-11-10 NOTE — Telephone Encounter (Signed)
Pt need new Rx for amlodipine   Pharm:  [facility] [address]  Pt is out and would like to know if you can call her in a few days until she receive hers from the mail order.

## 2015-11-10 NOTE — Telephone Encounter (Signed)
Sent 1 month in today

## 2015-11-12 ENCOUNTER — Emergency Department (HOSPITAL_COMMUNITY): Payer: BLUE CROSS/BLUE SHIELD

## 2015-11-12 ENCOUNTER — Emergency Department (HOSPITAL_COMMUNITY)
Admission: EM | Admit: 2015-11-12 | Discharge: 2015-11-12 | Disposition: A | Discharge status: Home / Self Care | Payer: BLUE CROSS/BLUE SHIELD | Attending: Emergency Medicine | Admitting: Emergency Medicine

## 2015-11-12 ENCOUNTER — Encounter (HOSPITAL_COMMUNITY): Payer: Self-pay | Admitting: *Deleted

## 2015-11-12 DIAGNOSIS — M545 Low back pain: Secondary | ICD-10-CM | POA: Diagnosis not present

## 2015-11-12 DIAGNOSIS — M549 Dorsalgia, unspecified: Secondary | ICD-10-CM | POA: Diagnosis present

## 2015-11-12 DIAGNOSIS — Z79899 Other long term (current) drug therapy: Secondary | ICD-10-CM | POA: Diagnosis not present

## 2015-11-12 DIAGNOSIS — G8929 Other chronic pain: Secondary | ICD-10-CM | POA: Insufficient documentation

## 2015-11-12 DIAGNOSIS — I10 Essential (primary) hypertension: Secondary | ICD-10-CM | POA: Insufficient documentation

## 2015-11-12 DIAGNOSIS — R109 Unspecified abdominal pain: Secondary | ICD-10-CM | POA: Diagnosis not present

## 2015-11-12 DIAGNOSIS — R0789 Other chest pain: Secondary | ICD-10-CM | POA: Insufficient documentation

## 2015-11-12 DIAGNOSIS — M546 Pain in thoracic spine: Secondary | ICD-10-CM | POA: Insufficient documentation

## 2015-11-12 DIAGNOSIS — E119 Type 2 diabetes mellitus without complications: Secondary | ICD-10-CM | POA: Diagnosis not present

## 2015-11-12 DIAGNOSIS — Z791 Long term (current) use of non-steroidal anti-inflammatories (NSAID): Secondary | ICD-10-CM | POA: Diagnosis not present

## 2015-11-12 DIAGNOSIS — Z794 Long term (current) use of insulin: Secondary | ICD-10-CM | POA: Insufficient documentation

## 2015-11-12 DIAGNOSIS — R079 Chest pain, unspecified: Secondary | ICD-10-CM | POA: Diagnosis not present

## 2015-11-12 LAB — URINALYSIS, ROUTINE W REFLEX MICROSCOPIC
Bilirubin Urine: NEGATIVE
Glucose, UA: 500 mg/dL — AB
Hgb urine dipstick: NEGATIVE
Ketones, ur: NEGATIVE mg/dL
LEUKOCYTES UA: NEGATIVE
Nitrite: NEGATIVE
PH: 6 (ref 5.0–8.0)
Protein, ur: NEGATIVE mg/dL
SPECIFIC GRAVITY, URINE: 1.021 (ref 1.005–1.030)

## 2015-11-12 LAB — COMPREHENSIVE METABOLIC PANEL
ALT: 18 U/L (ref 14–54)
AST: 17 U/L (ref 15–41)
Albumin: 3.6 g/dL (ref 3.5–5.0)
Alkaline Phosphatase: 80 U/L (ref 38–126)
Anion gap: 10 (ref 5–15)
BUN: 13 mg/dL (ref 6–20)
CO2: 24 mmol/L (ref 22–32)
Calcium: 9 mg/dL (ref 8.9–10.3)
Chloride: 104 mmol/L (ref 101–111)
Creatinine, Ser: 0.86 mg/dL (ref 0.44–1.00)
GFR calc Af Amer: >60 mL/min (ref >60)
GFR calc non Af Amer: >60 mL/min (ref >60)
Glucose, Bld: 241 mg/dL — ABNORMAL HIGH (ref 65–99)
Potassium: 4.3 mmol/L (ref 3.5–5.1)
Sodium: 138 mmol/L (ref 135–145)
Total Bilirubin: 0.6 mg/dL (ref 0.3–1.2)
Total Protein: 7 g/dL (ref 6.5–8.1)

## 2015-11-12 LAB — CBC WITH DIFFERENTIAL/PLATELET
Basophils Absolute: 0.1 10*3/uL (ref 0.0–0.1)
Basophils Relative: 1 %
Eosinophils Absolute: 0.7 10*3/uL (ref 0.0–0.7)
Eosinophils Relative: 6 %
HCT: 41.4 % (ref 36.0–46.0)
Hemoglobin: 13.8 g/dL (ref 12.0–15.0)
Lymphocytes Relative: 42 %
Lymphs Abs: 5 10*3/uL — ABNORMAL HIGH (ref 0.7–4.0)
MCH: 28 pg (ref 26.0–34.0)
MCHC: 33.3 g/dL (ref 30.0–36.0)
MCV: 84.1 fL (ref 78.0–100.0)
Monocytes Absolute: 0.6 10*3/uL (ref 0.1–1.0)
Monocytes Relative: 5 %
Neutro Abs: 5.5 10*3/uL (ref 1.7–7.7)
Neutrophils Relative %: 46 %
Platelets: 329 10*3/uL (ref 150–400)
RBC: 4.92 MIL/uL (ref 3.87–5.11)
RDW: 14 % (ref 11.5–15.5)
WBC: 11.9 10*3/uL — ABNORMAL HIGH (ref 4.0–10.5)

## 2015-11-12 LAB — I-STAT TROPONIN, ED: Troponin i, poc: 0 ng/mL (ref 0.00–0.08)

## 2015-11-12 LAB — LIPASE, BLOOD: Lipase: 37 U/L (ref 11–51)

## 2015-11-12 MED ORDER — CYCLOBENZAPRINE HCL 5 MG PO TABS: 5.0000 mg | ORAL_TABLET | Freq: Three times a day (TID) | ORAL | 0 refills | Status: DC | PRN

## 2015-11-12 NOTE — ED Notes (Signed)
Pt c/o intermittent L side chest discomfort x 1 week and intermittent L lower back/flank pain x 4 days.  Pain score 4/10.  Denies injuries and denies associated chest pain symptoms.

## 2015-11-12 NOTE — ED Triage Notes (Signed)
Pt complains of left flank pain. Pt states she noticed pain on Tuesday, which went away and came back last night. Pt has hx of kidney stones, states pain feels similar. Pt denies hematuria, dysuria or increased urinary frequency.

## 2015-11-12 NOTE — Discharge Instructions (Signed)
Read the information below.  Your labs and imaging are re-assuring. This may be musculoskeletal. Try taking tylenol 650mg  or motrin 400mg  every 6hrs. I have also prescribed flexeril. This can make you drowsy, do not drive after taking.  Use the prescribed medication as directed.  Please discuss all new medications with your pharmacist.   If your symptoms persist please follow up with your primary provider early next week  You may return to the Emergency Department at any time for worsening condition or any new symptoms that concern you. Return to ED if you develop fever, constant pain, blood in urine, blood in stool, unable to keep food/fluids down, chest pain, shortness of breath, or any new/concerning symptoms.

## 2015-11-12 NOTE — ED Provider Notes (Signed)
[facility] DEPT Provider Note   CSN: [id number] Arrival date & time: 11/12/15  1333     History   Chief Complaint Chief Complaint  Patient presents with  . Flank Pain    HPI Kristin Mathews is a 45 y.o. female.  Kristin Mathews is a 45 y.o. female with h/o depression, T2DM, endometriosis, IBS, HTN, MS, T2DM presents to ED with complaint of left back pain. Patient reports pain initially started on Tuesday, resolved on Wednesday, and returned last night. Pain is non-radiating, intermittent, and sharp in nature. No particular pattern to when pain initiates. No aggravating or alleviating factors. Patient reports pain feels similar to when she has had kidney stones in past. No trauma. Patient endorses chronic abdominal pain that is no different from use secondary to IBS. No dysuria, hematuria, N/V, fever, vaginal discharge, vaginal bleeding, pelvic pain, and vaginal pain. No loss of bowel or bladder function. No h/o IVDU, cancer, or chronic steroid use. Patient also complains of intermittent chest pain described as a "pulled muscle"x 1 week. No particular pattern to when pain starts. No aggravating or alleviating factors. No SOB, cough, neck pain, numbness, or weakness. No recent trauma or changes in activity. No recent long distance travel/surgery/hospitalization, leg swelling/pain, hemoptysis, or h/o cancer.      Past Medical History:  Diagnosis Date  . Depression   . Diabetes mellitus    Type 2  . Endometriosis   . Hypertension   . Multiple sclerosis ([place])   . Type II or unspecified type diabetes mellitus with peripheral circulatory disorders, uncontrolled(250.72) 11/27/2012    Patient Active Problem List   Diagnosis Date Noted  . Allergic rhinitis 04/27/2014  . Low back pain 04/27/2014  . Uncontrolled type 2 diabetes mellitus with peripheral circulatory disorder ([place]) 11/27/2012  . Depression 02/25/2008  . Hyperlipidemia 08/29/2006  . Multiple sclerosis ([place]) 07/05/2006  .  Essential hypertension 07/05/2006  . Endometriosis 07/05/2006    Past Surgical History:  Procedure Laterality Date  . EXPLORATORY LAPAROTOMY     endometriosis  . LUMBAR DISC SURGERY     LS spine 2005 or so  . WISDOM TOOTH EXTRACTION      OB History    No data available       Home Medications    Prior to Admission medications   Medication Sig Start Date End Date Taking? Authorizing Provider  amLODipine (NORVASC) 10 MG tablet TAKE 1 TABLET (10 MG TOTAL) BY MOUTH DAILY. 11/10/15  Yes Marin Olp, MD  BYDUREON 2 MG PEN INJECT 2 MG UNDER THE SKIN ONCE A WEEK 08/10/15  Yes [name], MD  carvedilol (COREG) 6.25 MG tablet TAKE 1 TABLET TWICE A DAY WITH A MEAL 09/26/15  Yes [name], MD  cetirizine (ZYRTEC) 10 MG tablet Take 10 mg by mouth every evening.    Yes Historical Provider, MD  DULoxetine (CYMBALTA) 60 MG capsule TAKE 1 CAPSULE DAILY 09/07/15  Yes Marin Olp, MD  glipiZIDE (GLUCOTROL XL) 10 MG 24 hr tablet TAKE 1 TABLET DAILY WITH BREAKFAST 10/31/15  Yes [name], MD  insulin lispro (HUMALOG) 100 UNIT/ML injection USE WITH VGO 40 TO INJECT 76 UNITS UNDER THE SKIN ONCE AS DIRECTED 10/03/15  Yes [name], MD  levonorgestrel-ethinyl estradiol (LYBREL,AMETHYST) 90-20 MCG tablet Take 1 tablet by mouth daily.    Yes Historical Provider, MD  metFORMIN (GLUCOPHAGE-XR) 500 MG 24 hr tablet TAKE 2 TABLETS (1,000 MG TOTAL) TWICE A DAY WITH A MEAL 09/15/15  Yes Salena Saner  Gherghe, MD  modafinil (PROVIGIL) 200 MG tablet Take 1 tablet (200 mg total) by mouth daily as needed. Patient taking differently: Take 200 mg by mouth daily as needed (For fatigue).  06/14/15  Yes Britt Bottom, MD  Naproxen Sodium 220 MG CAPS Take 440 mg by mouth 2 (two) times daily as needed (for pain.).    Yes Historical Provider, MD  natalizumab (TYSABRI) 300 MG/15ML injection Inject 300 mg into the vein every 30 (thirty) days.   Yes Historical Provider, MD  canagliflozin (INVOKANA)  100 MG TABS tablet Take 1 tablet (100 mg total) by mouth daily. Patient not taking: Reported on 11/12/2015 09/26/15   [name], MD  cyclobenzaprine (FLEXERIL) 5 MG tablet Take 1 tablet (5 mg total) by mouth 3 (three) times daily as needed for muscle spasms. 11/12/15   Roxanna Mew, PA-C  FREESTYLE TEST STRIPS test strip TEST BLOOD SUGAR TWICE A DAY. VARY TIMES AS DIRECTED. 08/30/14   [name], MD  Insulin Disposable Pump (V-GO 40) KIT USE DAILY 10/25/15   [name], MD  pravastatin (PRAVACHOL) 20 MG tablet Take 1 tablet (20 mg total) by mouth daily. Patient not taking: Reported on 11/12/2015 05/11/15   Marin Olp, MD    Family History Family History  Problem Relation Age of Onset  . Breast cancer Mother     early 76s  . Diabetes Mother     maternal grandmother as well  . Diabetes Father   . Other Father     progressive supranuclear palsy    Social History Social History  Substance Use Topics  . Smoking status: Never Smoker  . Smokeless tobacco: Never Used  . Alcohol use 0.0 oz/week     Comment: occasional     Allergies   Ace inhibitors   Review of Systems Review of Systems  Constitutional: Negative for fever.  HENT: Negative for trouble swallowing.   Eyes: Negative for visual disturbance.  Respiratory: Negative for cough and shortness of breath.   Cardiovascular: Positive for chest pain.  Gastrointestinal: Positive for abdominal pain (chronic, unchanged). Negative for nausea and vomiting.  Genitourinary: Negative for dysuria, frequency, hematuria, pelvic pain, vaginal bleeding, vaginal discharge and vaginal pain.  Musculoskeletal: Positive for back pain. Negative for neck pain.  Skin: Negative for rash.  Neurological: Negative for weakness and numbness.     Physical Exam Updated Vital Signs BP 151/85   Pulse 85   Temp 98.5 F (36.9 C) (Oral)   Resp 18   SpO2 94%   Physical Exam  Constitutional: She appears well-developed and  well-nourished. No distress.  HENT:  Head: Normocephalic and atraumatic.  Mouth/Throat: Oropharynx is clear and moist. No oropharyngeal exudate.  Eyes: Conjunctivae and EOM are normal. Pupils are equal, round, and reactive to light. Right eye exhibits no discharge. Left eye exhibits no discharge. No scleral icterus.  Neck: Normal range of motion and phonation normal. Neck supple. No neck rigidity. Normal range of motion present.  Cardiovascular: Normal rate, regular rhythm, normal heart sounds and intact distal pulses.   No murmur heard. Pulmonary/Chest: Effort normal and breath sounds normal. No stridor. No respiratory distress. She has no wheezes. She has no rales.     She exhibits tenderness.    TTP of left anterior chest wall and posterior, inferior thorax.   Abdominal: Soft. Bowel sounds are normal. She exhibits no distension. There is no tenderness. There is no rigidity, no rebound, no guarding and no CVA tenderness.  No TTP, guarding,  rigidity, or peritoneal signs.   Musculoskeletal: Normal range of motion.  Lymphadenopathy:    She has no cervical adenopathy.  Neurological: She is alert. She has normal strength. She is not disoriented. No sensory deficit. Coordination and gait normal. GCS eye subscore is 4. GCS verbal subscore is 5. GCS motor subscore is 6.  Patients moves all extremities, sensation and strength intact.   Skin: Skin is warm and dry. She is not diaphoretic.  Psychiatric: She has a normal mood and affect. Her behavior is normal.     ED Treatments / Results  Labs (all labs ordered are listed, but only abnormal results are displayed) Labs Reviewed  URINALYSIS, ROUTINE W REFLEX MICROSCOPIC (NOT AT [facility]) - Abnormal; Notable for the following:       Result Value   APPearance CLOUDY (*)    Glucose, UA 500 (*)    All other components within normal limits  COMPREHENSIVE METABOLIC PANEL - Abnormal; Notable for the following:    Glucose, Bld 241 (*)    All other  components within normal limits  CBC WITH DIFFERENTIAL/PLATELET - Abnormal; Notable for the following:    WBC 11.9 (*)    Lymphs Abs 5.0 (*)    All other components within normal limits  LIPASE, BLOOD  I-STAT TROPOININ, ED    EKG  EKG Interpretation None       Radiology Dg Chest 2 View  Result Date: 11/12/2015 CLINICAL DATA:  Left anterior chest pain x1 week EXAM: CHEST  2 VIEW COMPARISON:  12/19/2012 FINDINGS: Lungs are clear.  No pleural effusion or pneumothorax. The heart is normal in size. Visualized osseous structures are within normal limits. IMPRESSION: Normal chest radiographs. Electronically Signed   By: Julian Hy M.D.   On: 11/12/2015 17:36    Procedures Procedures (including critical care time)  Medications Ordered in ED Medications - No data to display   Initial Impression / Assessment and Plan / ED Course  I have reviewed the triage vital signs and the nursing notes.  Pertinent labs & imaging results that were available during my care of the patient were reviewed by me and considered in my medical decision making (see chart for details).  Clinical Course  Value Comment By Time  DG Chest 2 View Normal cardiac silhouette. No evidence of consolidation, effusion, or PTX. No free air under diaphragm.  Roxanna Mew, [place] 10/28 1800    Patient presents to ED with complaint of left sided mid back pain. Patient is afebrile and non-toxic appearing in NAD. VSS. Physical exam remarkable for TTP of left anterior chest wall, and left posterior, inferior thorax. No midline spinal tenderness. No focal neurologic deficits. Patient is ambulatory. Benign abdominal exam. No CVA tenderness. U/A  Unremarkable. Glycosuria; however, improved compared to previous; no hematuria. Suspect left back pain is MSK in nature. Given intermittent chest pain will check labs, EKG, and CXR. Patient currently denies anything for pain. Discussed patient with Dr. Lacinda Axon, agrees with plan.     CMP remarkable for hyperglycemia without evidence of DKA. LFTs and bilirubin unremarkable. Lipase nml. CBC remarkable for mild leukocytosis - ?stress response. Troponin negative. EKG shows sinus rhythm, normal intervals, no ST/T wave changes. Low suspicion for ACS. CXR show no acute cardiopulmonary process. Patient not tachypneic, tachycardic, or hypoxic. Well's 0, low suspicion for PE. Suspect intermittent chest pain is MSK as pain is reproducible with palpation of chest wall.   Discussed results and plan with patient. Symptomatic management discussed to include heat/ice,  tylenol/motrin. Rx flexeril for muscle pain. Follow up with PCP Monday if sxs persist. Return precautions given. Pt voiced understanding and is agreeable.   Final Clinical Impressions(s) / ED Diagnoses   Final diagnoses:  Acute left-sided thoracic back pain    New Prescriptions Discharge Medication List as of 11/12/2015  6:56 PM    START taking these medications   Details  cyclobenzaprine (FLEXERIL) 5 MG tablet Take 1 tablet (5 mg total) by mouth 3 (three) times daily as needed for muscle spasms., Starting Sat 11/12/2015, Print         [place], [place] 11/13/15 [place], MD 11/13/15 1121

## 2015-11-12 NOTE — ED Notes (Signed)
PA at bedside.

## 2015-11-12 NOTE — ED Notes (Signed)
Verbalized understanding discharge instructions, prescription, and follow-up. In no acute distress.

## 2015-11-17 DIAGNOSIS — Z1231 Encounter for screening mammogram for malignant neoplasm of breast: Secondary | ICD-10-CM | POA: Diagnosis not present

## 2015-11-21 ENCOUNTER — Ambulatory Visit: Payer: BLUE CROSS/BLUE SHIELD | Admitting: Internal Medicine

## 2015-12-13 DIAGNOSIS — G35 Multiple sclerosis: Secondary | ICD-10-CM | POA: Diagnosis not present

## 2016-01-11 DIAGNOSIS — G35 Multiple sclerosis: Secondary | ICD-10-CM | POA: Diagnosis not present

## 2016-01-23 ENCOUNTER — Other Ambulatory Visit: Payer: Self-pay

## 2016-01-23 ENCOUNTER — Ambulatory Visit (INDEPENDENT_AMBULATORY_CARE_PROVIDER_SITE_OTHER): Payer: BLUE CROSS/BLUE SHIELD | Admitting: Internal Medicine

## 2016-01-23 ENCOUNTER — Encounter: Payer: Self-pay | Admitting: Internal Medicine

## 2016-01-23 VITALS — BP 134/88 | HR 86 | Ht 67.0 in | Wt 231.0 lb

## 2016-01-23 DIAGNOSIS — E1165 Type 2 diabetes mellitus with hyperglycemia: Secondary | ICD-10-CM | POA: Diagnosis not present

## 2016-01-23 DIAGNOSIS — E1151 Type 2 diabetes mellitus with diabetic peripheral angiopathy without gangrene: Secondary | ICD-10-CM | POA: Diagnosis not present

## 2016-01-23 DIAGNOSIS — IMO0002 Reserved for concepts with insufficient information to code with codable children: Secondary | ICD-10-CM

## 2016-01-23 LAB — POCT GLYCOSYLATED HEMOGLOBIN (HGB A1C): Hemoglobin A1C: 7.6

## 2016-01-23 MED ORDER — INSULIN LISPRO 100 UNIT/ML ~~LOC~~ SOLN: SUBCUTANEOUS | 0 refills | Status: DC

## 2016-01-23 MED ORDER — EXENATIDE ER 2 MG ~~LOC~~ PEN: PEN_INJECTOR | SUBCUTANEOUS | 1 refills | Status: DC

## 2016-01-23 MED ORDER — V-GO 40 KIT: PACK | 0 refills | Status: DC

## 2016-01-23 NOTE — Patient Instructions (Addendum)
Please continue: - Metformin ER 1000 mg 2x a day - Glipizide XL 10 mg in am  - Bydureon 2 mg weekly  Please change: - VGo 40: insulin to carb ratio of 1:5 (1 click per 10 g carbs)  Try to stop Glipizide for few days.  Please come back for a follow-up appointment in 3 months.

## 2016-01-23 NOTE — Progress Notes (Signed)
Patient ID: SKYELYN BILLINGSLEA, female   DOB: December 28, 1970, 46 y.o.   MRN: [id number]  HPI: LILLEY FLEEGE is a 46 y.o.-year-old female, returning for f/u for DM2, dx 2009, insulin-dependent since 2009, uncontrolled, without long term complications. Last visit 4 mo ago.  Since last visit, she saw the diabetes educator for a discussion about insulin pumps >> decided for a [employer] pump.   Last hemoglobin A1c was: Lab Results  Component Value Date   HGBA1C 9.0 09/26/2015   HGBA1C 8.5 (H) 02/22/2015   HGBA1C 8.2 08/03/2014   She uses frequent steroid courses for MS.  Now on a VGo 40: Please continue: - Metformin ER 1000 mg 2x a day - Glipizide XL 10 mg in am  - Bydureon 2 mg weekly - VGo 40  - Breakfast: 3-4 >> 2 pushes - Lunch: 7 >> 5 pushes - Dinner: 7 >> 7-10 pushes - Snacks: 2 pushes She was on Invokana 100 >> 300 >> had few yeast infections - stopped in 05/2013 and tried again in 2017 >> stopped again b/c yeast inf. We stopped Humulin 70/30 25 units bid. Januvia - did not help.  Glumetza not covered by insurance.  Pt checks her sugars 0-1x a day (forgot log): - am:  [phone] >> 86-147 >> 50s, 77-130 >> 64-117, 150, 163 >> 69-131, 150 - 2h after b'fast: 197 >> n/c >> 127 >> 148 >> 147-193 >> 166 >> n/c - lunch: 105-145, 157 >> n/c >> 128-149 >> 83-134, 160 >> 130s >> n/c >> 82-148, 172, 203 - 2h after lunch:  240 >> n/c >> 105-194, 237, 295 >> 169-206, 273 >> n/c  - dinner:  110-139 >> 108-213, 260 >> 120-190 >> 116, 139, 179, 237 >> 45, 47, 55, 74-137, 283 - 2h after dinner: 227-241 >> n/c  >> 217 >> 179-207 >> 170-230 >> 153-272 >> 165 >> 208 - bedtime:  177-219 >> 207 >> 181-280 >> 128-243 >> 150s >> 122-246 >> 134, 152, 314  - nighttime: 41, 43, 60, 134 + lows >> 21!! (at night), 64 (injected too much insulin with dinner) >> 41; she has hypoglycemia awareness at 50s.  Highest 200s >> 246 >> 314 (potato).  Pt's meals are: - Breakfast: yoghurt + granola, cereals (3-4  pushes) - Lunch: sandwich, sushi, [place] (7 pushes) - Dinner: home cooked meal: stews, etc.(7 pushes) - Snacks: 3-4 (2 pushes)   - no CKD, last BUN/creatinine:  Lab Results  Component Value Date   BUN 13 11/12/2015   CREATININE 0.86 11/12/2015  Was on benazepril. Angioedema from ACEI 12/19/2012! - last set of lipids: Lab Results  Component Value Date   CHOL 195 02/22/2015   HDL 42.50 02/22/2015   LDLCALC 105 (H) 11/12/2012   LDLDIRECT 125.0 02/22/2015   TRIG 211.0 (H) 02/22/2015   CHOLHDL 5 02/22/2015  Started a statin: Pravachol 20 mg. - last eye exam was 09/2014.  No DR.  - mild numbness and tingling in her feet. Last foot exam normal 04/2015.  She also has a history of MS (neuro Dr Arlice Colt) - last 2 atacks: 02/2012, 2010; also HTN, borderline HL, fatty liver, endometriosis.  I reviewed pt's medications, allergies, PMH, social hx, family hx, and changes were documented in the history of present illness. Otherwise, unchanged from my initial visit note.  ROS: Constitutional: no weight gain, no fatigue, no subjective hyperthermia Eyes: no blurry vision, no xerophthalmia ENT: no sore throat, no nodules palpated in throat, no dysphagia/odynophagia, no hoarseness Cardiovascular:  no CP/SOB/palpitations/leg swelling Respiratory: no cough/SOB Gastrointestinal: no N/V/D/C Musculoskeletal: no muscle aches/no joint aches Skin: no rashes, no hair loss Neurological: no tremors/numbness/tingling/dizziness  PE: BP 134/88 (BP Location: Left Arm, Patient Position: Sitting)   Pulse 86   Ht 5\' 7"  (1.702 m)   Wt 231 lb (104.8 kg)   SpO2 98%   BMI 36.18 kg/m  Body mass index is 36.18 kg/m. Wt Readings from Last 3 Encounters:  01/23/16 231 lb (104.8 kg)  09/26/15 230 lb (104.3 kg)  08/03/15 227 lb (103 kg)   Constitutional: overweight, in NAD Eyes: PERRLA, EOMI, no exophthalmos ENT: moist mucous membranes, no thyromegaly, no cervical lymphadenopathy Cardiovascular: RRR, No  MRG Respiratory: CTA B Gastrointestinal: abdomen soft, NT, ND, BS+ Musculoskeletal: no deformities, strength intact in all 4 Skin: moist, warm, no rashes Neurological: no tremor with outstretched hands  ASSESSMENT: 1. DM2, insulin-dependent, uncontrolled, with hyperglycemia  PLAN:  1. Patient with a h/o DM2 (exacerbated by steroid use for MS), with very fluctuating sugars, higher later in the day. At last visit, we added Invokana, which she used in the past, but she again stopped it due to yeast infections. She is also on Bydureon, however, she has been on Victoza, which was working much better for he than Bydureon, but will continue with this for now as she is doing better. - However, her sugars are better and she even had several lows in the 40s, especially in the second half of the day. She tells me she sometimes overbolused with her meals. Will give her an ICR and also advised her to try to stop Glipizide to see if this reduces the frequency of her low CBGs. - Last HbA1c was higher: 9.0% >> I suggested a regular insulin pump >> she decided against this -  I advised her to:   Patient Instructions  Please continue: - Metformin ER 1000 mg 2x a day - Glipizide XL 10 mg in am  - Bydureon 2 mg weekly  Please change: - VGo 40: insulin to carb ratio of 1:5 (1 click per 10 g carbs)  Try to stop Glipizide for few days.  Please come back for a follow-up appointment in 3 months.  - continue checking sugars at different times of the day - check 2-3 times a day, rotating checks  - needs a new eye exam - UTD with flu shot - check HbA1c today >> 7.6% (better!)  - Return to clinic in 3 mo with sugar log   [name], MD PhD [facility] Endocrinology

## 2016-01-23 NOTE — Addendum Note (Signed)
Addended by: Caprice Beaver T on: 01/23/2016 09:33 AM   Modules accepted: Orders

## 2016-02-08 DIAGNOSIS — G35 Multiple sclerosis: Secondary | ICD-10-CM | POA: Diagnosis not present

## 2016-02-16 DIAGNOSIS — M9903 Segmental and somatic dysfunction of lumbar region: Secondary | ICD-10-CM | POA: Diagnosis not present

## 2016-02-16 DIAGNOSIS — M5136 Other intervertebral disc degeneration, lumbar region: Secondary | ICD-10-CM | POA: Diagnosis not present

## 2016-02-16 DIAGNOSIS — M545 Low back pain: Secondary | ICD-10-CM | POA: Diagnosis not present

## 2016-02-16 DIAGNOSIS — M62830 Muscle spasm of back: Secondary | ICD-10-CM | POA: Diagnosis not present

## 2016-02-21 DIAGNOSIS — M9903 Segmental and somatic dysfunction of lumbar region: Secondary | ICD-10-CM | POA: Diagnosis not present

## 2016-02-21 DIAGNOSIS — M62830 Muscle spasm of back: Secondary | ICD-10-CM | POA: Diagnosis not present

## 2016-02-21 DIAGNOSIS — M545 Low back pain: Secondary | ICD-10-CM | POA: Diagnosis not present

## 2016-02-21 DIAGNOSIS — M5136 Other intervertebral disc degeneration, lumbar region: Secondary | ICD-10-CM | POA: Diagnosis not present

## 2016-02-27 DIAGNOSIS — M62830 Muscle spasm of back: Secondary | ICD-10-CM | POA: Diagnosis not present

## 2016-02-27 DIAGNOSIS — M5136 Other intervertebral disc degeneration, lumbar region: Secondary | ICD-10-CM | POA: Diagnosis not present

## 2016-02-27 DIAGNOSIS — M9903 Segmental and somatic dysfunction of lumbar region: Secondary | ICD-10-CM | POA: Diagnosis not present

## 2016-02-27 DIAGNOSIS — M545 Low back pain: Secondary | ICD-10-CM | POA: Diagnosis not present

## 2016-03-01 DIAGNOSIS — M545 Low back pain: Secondary | ICD-10-CM | POA: Diagnosis not present

## 2016-03-01 DIAGNOSIS — M9903 Segmental and somatic dysfunction of lumbar region: Secondary | ICD-10-CM | POA: Diagnosis not present

## 2016-03-01 DIAGNOSIS — M62830 Muscle spasm of back: Secondary | ICD-10-CM | POA: Diagnosis not present

## 2016-03-01 DIAGNOSIS — M5136 Other intervertebral disc degeneration, lumbar region: Secondary | ICD-10-CM | POA: Diagnosis not present

## 2016-03-05 ENCOUNTER — Other Ambulatory Visit: Payer: Self-pay | Admitting: Family Medicine

## 2016-03-06 DIAGNOSIS — M62830 Muscle spasm of back: Secondary | ICD-10-CM | POA: Diagnosis not present

## 2016-03-06 DIAGNOSIS — M545 Low back pain: Secondary | ICD-10-CM | POA: Diagnosis not present

## 2016-03-06 DIAGNOSIS — M5136 Other intervertebral disc degeneration, lumbar region: Secondary | ICD-10-CM | POA: Diagnosis not present

## 2016-03-06 DIAGNOSIS — M9903 Segmental and somatic dysfunction of lumbar region: Secondary | ICD-10-CM | POA: Diagnosis not present

## 2016-03-08 DIAGNOSIS — M545 Low back pain: Secondary | ICD-10-CM | POA: Diagnosis not present

## 2016-03-08 DIAGNOSIS — M5136 Other intervertebral disc degeneration, lumbar region: Secondary | ICD-10-CM | POA: Diagnosis not present

## 2016-03-08 DIAGNOSIS — M9903 Segmental and somatic dysfunction of lumbar region: Secondary | ICD-10-CM | POA: Diagnosis not present

## 2016-03-08 DIAGNOSIS — M62830 Muscle spasm of back: Secondary | ICD-10-CM | POA: Diagnosis not present

## 2016-03-13 ENCOUNTER — Other Ambulatory Visit: Payer: Self-pay | Admitting: Internal Medicine

## 2016-03-13 DIAGNOSIS — M5136 Other intervertebral disc degeneration, lumbar region: Secondary | ICD-10-CM | POA: Diagnosis not present

## 2016-03-13 DIAGNOSIS — M9903 Segmental and somatic dysfunction of lumbar region: Secondary | ICD-10-CM | POA: Diagnosis not present

## 2016-03-13 DIAGNOSIS — M545 Low back pain: Secondary | ICD-10-CM | POA: Diagnosis not present

## 2016-03-13 DIAGNOSIS — M62830 Muscle spasm of back: Secondary | ICD-10-CM | POA: Diagnosis not present

## 2016-03-13 DIAGNOSIS — G35 Multiple sclerosis: Secondary | ICD-10-CM | POA: Diagnosis not present

## 2016-03-14 ENCOUNTER — Telehealth: Payer: Self-pay | Admitting: Internal Medicine

## 2016-03-14 ENCOUNTER — Other Ambulatory Visit: Payer: Self-pay

## 2016-03-14 MED ORDER — GLUCOSE BLOOD VI STRP: ORAL_STRIP | 3 refills | Status: DC

## 2016-03-14 NOTE — Telephone Encounter (Signed)
Refill on the test strips of the freestyle meter called [facility] 3 month supply

## 2016-03-14 NOTE — Telephone Encounter (Signed)
Submitted

## 2016-03-22 ENCOUNTER — Telehealth: Payer: Self-pay | Admitting: Internal Medicine

## 2016-03-22 DIAGNOSIS — M9903 Segmental and somatic dysfunction of lumbar region: Secondary | ICD-10-CM | POA: Diagnosis not present

## 2016-03-22 DIAGNOSIS — M5136 Other intervertebral disc degeneration, lumbar region: Secondary | ICD-10-CM | POA: Diagnosis not present

## 2016-03-22 DIAGNOSIS — M545 Low back pain: Secondary | ICD-10-CM | POA: Diagnosis not present

## 2016-03-22 DIAGNOSIS — M62830 Muscle spasm of back: Secondary | ICD-10-CM | POA: Diagnosis not present

## 2016-03-22 NOTE — Telephone Encounter (Signed)
Called and spoke with [facility], Patient had advised with them regarding which test strips were needed.

## 2016-03-22 NOTE — Telephone Encounter (Signed)
[facility] called in to verify the test strips that were sent over. CB# [phone] Ref # [id number]

## 2016-04-10 ENCOUNTER — Other Ambulatory Visit: Payer: Self-pay | Admitting: *Deleted

## 2016-04-10 ENCOUNTER — Telehealth: Payer: Self-pay | Admitting: *Deleted

## 2016-04-10 ENCOUNTER — Other Ambulatory Visit (INDEPENDENT_AMBULATORY_CARE_PROVIDER_SITE_OTHER): Payer: Self-pay

## 2016-04-10 DIAGNOSIS — G35 Multiple sclerosis: Secondary | ICD-10-CM

## 2016-04-10 DIAGNOSIS — Z79899 Other long term (current) drug therapy: Secondary | ICD-10-CM | POA: Diagnosis not present

## 2016-04-10 DIAGNOSIS — Z0289 Encounter for other administrative examinations: Secondary | ICD-10-CM

## 2016-04-10 MED ORDER — MODAFINIL 200 MG PO TABS: 200.0000 mg | ORAL_TABLET | Freq: Every day | ORAL | 1 refills | Status: DC | PRN

## 2016-04-10 NOTE — Telephone Encounter (Signed)
Provigil rx. faxed to [facility] per request.  F/U with RAS scheduled to eval increased spasticity. JCV ab/CBC ordered/fim

## 2016-04-11 LAB — CBC WITH DIFFERENTIAL/PLATELET
BASOS: 1 %
Basophils Absolute: 0.1 10*3/uL (ref 0.0–0.2)
EOS (ABSOLUTE): 0.6 10*3/uL — AB (ref 0.0–0.4)
Eos: 4 %
Hematocrit: 44.6 % (ref 34.0–46.6)
Hemoglobin: 15 g/dL (ref 11.1–15.9)
IMMATURE GRANULOCYTES: 1 %
Immature Grans (Abs): 0.1 10*3/uL (ref 0.0–0.1)
Lymphocytes Absolute: 4.9 10*3/uL — ABNORMAL HIGH (ref 0.7–3.1)
Lymphs: 39 %
MCH: 28.6 pg (ref 26.6–33.0)
MCHC: 33.6 g/dL (ref 31.5–35.7)
MCV: 85 fL (ref 79–97)
MONOS ABS: 0.5 10*3/uL (ref 0.1–0.9)
Monocytes: 4 %
NEUTROS PCT: 51 %
Neutrophils Absolute: 6.5 10*3/uL (ref 1.4–7.0)
PLATELETS: 352 10*3/uL (ref 150–379)
RBC: 5.24 x10E6/uL (ref 3.77–5.28)
RDW: 15.1 % (ref 12.3–15.4)
WBC: 12.6 10*3/uL — AB (ref 3.4–10.8)

## 2016-04-17 ENCOUNTER — Encounter: Payer: Self-pay | Admitting: Neurology

## 2016-04-17 ENCOUNTER — Ambulatory Visit (INDEPENDENT_AMBULATORY_CARE_PROVIDER_SITE_OTHER): Payer: BLUE CROSS/BLUE SHIELD | Admitting: Neurology

## 2016-04-17 VITALS — BP 141/92 | HR 72 | Wt 230.5 lb

## 2016-04-17 DIAGNOSIS — R3915 Urgency of urination: Secondary | ICD-10-CM | POA: Diagnosis not present

## 2016-04-17 DIAGNOSIS — R269 Unspecified abnormalities of gait and mobility: Secondary | ICD-10-CM

## 2016-04-17 DIAGNOSIS — R0683 Snoring: Secondary | ICD-10-CM

## 2016-04-17 DIAGNOSIS — G4710 Hypersomnia, unspecified: Secondary | ICD-10-CM | POA: Diagnosis not present

## 2016-04-17 DIAGNOSIS — R202 Paresthesia of skin: Secondary | ICD-10-CM | POA: Diagnosis not present

## 2016-04-17 DIAGNOSIS — G35 Multiple sclerosis: Secondary | ICD-10-CM

## 2016-04-17 NOTE — Progress Notes (Signed)
[facility]  PATIENT: Kristin Mathews DOB: November 05, 1970  REFERRING CLINICIAN: Dr. Leanne Chang  HISTORY FROM: Patient REASON FOR VISIT: MS   HISTORICAL  CHIEF COMPLAINT:  Chief Complaint  Patient presents with  . Multiple Sclerosis    Sts. she continues to tolerate Tysabri well.  JCV ab status checked on 04/10/16; results are pending/fim    HISTORY OF PRESENT ILLNESS:  Kristin Mathews is a 46 yo woman who presented with unilateral numbness in May 2001 and was diagnosed with transverse myelitis.    MRI only showed one spot and an LP was non-diagnostic.   She had another episode of numbness 6 months later and MRI showed several newer lesions.    She was placed on REbif as part of the Rebiject study.     She had an exacerbation in 2007 and in 2010 with numbness and then optic neuritis.   In 2015, she had another numbness exacerbation and we decided to switch to Tysabri.   She has had 3 doses, last dose was 12/1/8/205.    Insurance Nurse, mental health) will not cover Tysabri in a hospital outpt center and we are trying to find out how best to continue her infusions.     She tolerates Tysabri well.  Gait/strength/sensation   She notes mild spasticity but no weakness.   Gait is fine but she feels off balanced if she stands on a chair or ladder.   She fell and hit her head once while changing a smoke alarm battery.  Her tingling is mostly in the toes and bottoms of her feet.    Bladder:    She has bowel frequency and rare incontinence, especially if sugars are high.   Rare bowel incontinence, also if sugars very high  Vision:   She notes mild visual blurring.   She has been told in the past that her allergies are affecting her vision.   Blurriness improves after she is up a while.   She is going to see ophtho.     Fatigue is often a problem.   Thi is physical > mental.    She has poor sleep with a lot of nocturnal awakenings.    Usually she falls back asleep when she wakes up.    She snores.   No  reports of apnea.  She is sleepy during the day, helped byu Provigil  EPWORTH SLEEPINESS SCALE  On a scale of 0 - 3 what is the chance of dozing:  Sitting and Reading:   3 Watching TV:    3 Sitting inactive in a public place: 0 Passenger in car for one hour: 3 Lying down to rest in the afternoon: 3 Sitting and talking to someone: 0 Sitting quietly after lunch:  3 In a car, stopped in traffic:  0  Total (out of 24):    15/24 (moderate sleepiness)  Mood:   She denies any difficulty with depression or anxiety.   No cognitive issues   REVIEW OF SYSTEMS:  Constitutional: No fevers, chills, sweats, or change in appetite.   Has fatigue.  Has Poor sleep Eyes: No visual changes, double vision, eye pain Ear, nose and throat: No hearing loss, ear pain, nasal congestion, sore throat Cardiovascular: No chest pain, palpitations Respiratory:  No shortness of breath at rest or with exertion.   No wheezes.   Snoring GastrointestinaI: No nausea, vomiting, diarrhea, abdominal pain.   Rare fecal incontinence Genitourinary:    She reports urgency with rare incontinence Musculoskeletal:  No  neck pain, back pain Integumentary: No rash, pruritus, skin lesions Neurological: as above Psychiatric: No depression at this time.  No anxiety Endocrine: No palpitations, diaphoresis, change in appetite, change in weigh or increased thirst Hematologic/Lymphatic:  No anemia, purpura, petechiae. Allergic/Immunologic: No itchy/runny eyes, nasal congestion, recent allergic reactions, rashes  ALLERGIES: Allergies  Allergen Reactions  . Ace Inhibitors Swelling    Edema    HOME MEDICATIONS: Outpatient Medications Prior to Visit  Medication Sig Dispense Refill  . amLODipine (NORVASC) 10 MG tablet TAKE 1 TABLET (10 MG TOTAL) BY MOUTH DAILY. 30 tablet 0  . carvedilol (COREG) 6.25 MG tablet TAKE 1 TABLET TWICE A DAY WITH A MEAL 180 tablet 2  . cetirizine (ZYRTEC) 10 MG tablet Take 10 mg by mouth every evening.       . DULoxetine (CYMBALTA) 60 MG capsule TAKE 1 CAPSULE DAILY 90 capsule 0  . Exenatide ER (BYDUREON) 2 MG PEN INJECT 2 MG UNDER THE SKIN ONCE A WEEK 12 each 1  . glipiZIDE (GLUCOTROL XL) 10 MG 24 hr tablet TAKE 1 TABLET DAILY WITH BREAKFAST 90 tablet 1  . glucose blood (FREESTYLE TEST STRIPS) test strip TEST BLOOD SUGAR TWICE A DAY. VARY TIMES AS DIRECTED. 200 each 3  . Insulin Disposable Pump (V-GO 40) KIT USE DAILY 90 kit 0  . insulin lispro (HUMALOG) 100 UNIT/ML injection USE WITH VGO 40 TO INJECT 76 UNITS UNDER THE SKIN ONCE AS DIRECTED 70 mL 0  . levonorgestrel-ethinyl estradiol (LYBREL,AMETHYST) 90-20 MCG tablet Take 1 tablet by mouth daily.     . metFORMIN (GLUCOPHAGE-XR) 500 MG 24 hr tablet TAKE 2 TABLETS (1,000 MG TOTAL) TWICE A DAY WITH A MEAL 360 tablet 1  . modafinil (PROVIGIL) 200 MG tablet Take 1 tablet (200 mg total) by mouth daily as needed. 90 tablet 1  . Naproxen Sodium 220 MG CAPS Take 440 mg by mouth 2 (two) times daily as needed (for pain.).     Marland Kitchen natalizumab (TYSABRI) 300 MG/15ML injection Inject 300 mg into the vein every 30 (thirty) days.    . canagliflozin (INVOKANA) 100 MG TABS tablet Take 1 tablet (100 mg total) by mouth daily. 30 tablet 2  . pravastatin (PRAVACHOL) 20 MG tablet Take 1 tablet (20 mg total) by mouth daily. 90 tablet 3  . cyclobenzaprine (FLEXERIL) 5 MG tablet Take 1 tablet (5 mg total) by mouth 3 (three) times daily as needed for muscle spasms. 15 tablet 0   No facility-administered medications prior to visit.     PAST MEDICAL HISTORY: Past Medical History:  Diagnosis Date  . Depression   . Diabetes mellitus    Type 2  . Endometriosis   . Hypertension   . Multiple sclerosis ([place])   . Type II or unspecified type diabetes mellitus with peripheral circulatory disorders, uncontrolled(250.72) 11/27/2012    PAST SURGICAL HISTORY: Past Surgical History:  Procedure Laterality Date  . EXPLORATORY LAPAROTOMY     endometriosis  . LUMBAR DISC SURGERY      LS spine 2005 or so  . WISDOM TOOTH EXTRACTION      FAMILY HISTORY: Family History  Problem Relation Age of Onset  . Breast cancer Mother     early 23s  . Diabetes Mother     maternal grandmother as well  . Diabetes Father   . Other Father     progressive supranuclear palsy    SOCIAL HISTORY:  Social History   Social History  . Marital status: Single  Spouse name: N/A  . Number of children: N/A  . Years of education: N/A   Occupational History  . Not on file.   Social History Main Topics  . Smoking status: Never Smoker  . Smokeless tobacco: Never Used  . Alcohol use 0.0 oz/week     Comment: occasional  . Drug use: No  . Sexual activity: Not on file   Other Topics Concern  . Not on file   Social History Narrative   Single. Lives alone. No children. Pet cat.       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   General: The patient is well-developed and well-nourished and in no acute distress   Neurologic Exam  Mental status: The patient is alert and oriented x 3 at the time of the examination. The patient has apparent normal recent and remote memory, with an apparently normal attention span and concentration ability.   Speech is normal.  Cranial nerves: Extraocular movements are full. Pupils are equal, round, and reactive to light and accomodation.  Visual fields are full.  Facial symmetry is present. There is mild reduced right facial sensation to soft touch.  Facial strength is normal.  Trapezius and sternocleidomastoid strength is normal. No dysarthria is noted.  The tongue is midline, and the patient has symmetric elevation of the soft palate. No obvious hearing deficits are  noted.  Motor:  Muscle bulk and tone are normal. Strength is  5 / 5 in all 4 extremities.   Sensory: Sensory testing is intact to pinprick, soft touch, vibration sensation, and position sense in her hands but she reports decreased vibration sensation in both feet, predominantly at the toes   Coordination: Cerebellar testing reveals Good finger-to-nose and heel-to-shin bilaterally.  Gait and station: Station and gait are normal. Tandem gait is wide. Romberg is negative.   Reflexes: Deep tendon reflexes are symmetric and normal bilaterally.        DIAGNOSTIC DATA (LABS, IMAGING, TESTING) - I reviewed patient records, labs, notes, testing and imaging myself where available.  Lab Results  Component Value Date   WBC 12.6 (H) 04/10/2016   HGB 13.8 11/12/2015   HCT 44.6 04/10/2016   MCV 85 04/10/2016   PLT 352 04/10/2016      Component Value Date/Time   NA 138 11/12/2015 1542   K 4.3 11/12/2015 1542   CL 104 11/12/2015 1542   CO2 24 11/12/2015 1542   GLUCOSE 241 (H) 11/12/2015 1542   BUN 13 11/12/2015 1542   CREATININE 0.86 11/12/2015 1542   CREATININE 0.73 04/30/2014 1109   CALCIUM 9.0 11/12/2015 1542   PROT 7.0 11/12/2015 1542   PROT 6.7 10/12/2014 1331   ALBUMIN 3.6 11/12/2015 1542   ALBUMIN 4.1 10/12/2014 1331   AST 17 11/12/2015 1542   ALT 18 11/12/2015 1542   ALKPHOS 80 11/12/2015 1542   BILITOT 0.6 11/12/2015 1542   BILITOT 0.3 10/12/2014 1331   GFRNONAA >60 11/12/2015 1542   GFRNONAA >89 04/30/2014 1109   GFRAA >60 11/12/2015 1542   GFRAA >89 04/30/2014 1109   Lab Results  Component Value Date   CHOL 195  02/22/2015   HDL 42.50 02/22/2015   LDLCALC 105 (H) 11/12/2012   LDLDIRECT 125.0 02/22/2015   TRIG 211.0 (H) 02/22/2015   CHOLHDL 5 02/22/2015   Lab Results  Component Value Date   HGBA1C 7.6 01/23/2016   No results found for: VITAMINB12 Lab Results  Component Value Date   TSH 2.25 04/30/2014   No components found for:  VITAMIND     ASSESSMENT AND PLAN  Multiple sclerosis (HCC)  Gait disturbance  Tingling  Snoring - Plan: Split night study  Urinary urgency  Excessive sleepiness - Plan: Split night study   1.   She will continue Tysabri. The JCV antibody was 0.20 indeterminate and we are awaiting the inhibition assay results. If she does convert to low positive and then to higher positive, consideration change in therapy. 2.    We will check an MRI of the brain with and without contrast to determine if there is been any subclinical progression. If present, consider a change to a different disease modifying therapy. 3.   She snores and has excessive daytime sleepiness. We will check a split-night PSG study and start CPAP or other intervention depending on the results. 4.    She will return to see me in 5-6 months if stable or sooner if she is found to have obstructive sleep apnea or if she notes other new or worsening symptoms.  Melroy Bougher A. Felecia Shelling, MD, PhD 02/23/9369, 6:96 PM Certified in Neurology, [place] Neurophys[facility], Sleep Medicine, Pain Medicine and Neuroimaging  [facility] [address] [place], [place] [address] [phone]

## 2016-04-18 ENCOUNTER — Encounter: Payer: Self-pay | Admitting: *Deleted

## 2016-04-18 NOTE — Progress Notes (Signed)
04-10-16 JCV ab Indeterminate at 0.20.  Inhibition assay pending/fim

## 2016-04-19 ENCOUNTER — Encounter: Payer: Self-pay | Admitting: *Deleted

## 2016-04-20 ENCOUNTER — Telehealth: Payer: Self-pay | Admitting: Neurology

## 2016-04-20 NOTE — Telephone Encounter (Signed)
I have spoken with Kristin Mathews--she sts. MRI cost is $560 out of pocket.  Would prefer to wait on MRI at this time, as she is not  having any new sx.  I expressed understanding, due to cost, and  will let Dr. Felecia Shelling know/fim

## 2016-04-20 NOTE — Telephone Encounter (Signed)
I spoke to [place] and she informed me that she was not aware that Dr. Felecia Shelling had order her a MRI.Marland Kitchen She was just wondering if it is necessary for her to have this MRI right now because she stated her insurance has gone up on  the price of the MRI.Marland KitchenMarland Kitchen

## 2016-04-24 ENCOUNTER — Ambulatory Visit: Payer: BLUE CROSS/BLUE SHIELD | Admitting: Internal Medicine

## 2016-04-26 DIAGNOSIS — M62830 Muscle spasm of back: Secondary | ICD-10-CM | POA: Diagnosis not present

## 2016-04-26 DIAGNOSIS — M545 Low back pain: Secondary | ICD-10-CM | POA: Diagnosis not present

## 2016-04-26 DIAGNOSIS — M5136 Other intervertebral disc degeneration, lumbar region: Secondary | ICD-10-CM | POA: Diagnosis not present

## 2016-04-26 DIAGNOSIS — M9903 Segmental and somatic dysfunction of lumbar region: Secondary | ICD-10-CM | POA: Diagnosis not present

## 2016-05-05 ENCOUNTER — Other Ambulatory Visit: Payer: Self-pay | Admitting: Family Medicine

## 2016-05-05 ENCOUNTER — Other Ambulatory Visit: Payer: Self-pay | Admitting: Internal Medicine

## 2016-05-08 ENCOUNTER — Encounter: Payer: Self-pay | Admitting: Neurology

## 2016-05-09 DIAGNOSIS — G35 Multiple sclerosis: Secondary | ICD-10-CM | POA: Diagnosis not present

## 2016-05-16 ENCOUNTER — Ambulatory Visit (INDEPENDENT_AMBULATORY_CARE_PROVIDER_SITE_OTHER): Payer: BLUE CROSS/BLUE SHIELD | Admitting: Neurology

## 2016-05-16 DIAGNOSIS — G4710 Hypersomnia, unspecified: Secondary | ICD-10-CM

## 2016-05-16 DIAGNOSIS — R0683 Snoring: Secondary | ICD-10-CM

## 2016-05-17 NOTE — Progress Notes (Signed)
PATIENT'S NAME:  Kristin Mathews, Kristin Mathews DOB:      08-19-1970      MR#:    [id number]     DATE OF RECORDING: 05/16/2016 REFERRING M.D.:  Garret Reddish MD Study Performed:   Baseline Polysomnogram HISTORY:  Depression, Diabetes, Endometriosis, Hypertension, MS  The patient endorsed the Epworth Sleepiness Scale at 15 points.  She has snoring  The patient's weight 229 pounds with a height of 67 (inches), resulting in a BMI of 36. kg/m2.  The patient's neck circumference measured 14 inches.  CURRENT MEDICATIONS: Norvasc, Coreg, Zyrtec, Cymbalta, Glipizide, Insulin, Humalog, Lybrel, Metformin, Provigil, Tysabri, Invokana, Pravachol, Flexeril   PROCEDURE:  This is a multichannel digital polysomnogram utilizing the Somnostar 11.2 system.  Electrodes and sensors were applied and monitored per AASM Specifications.   EEG, EOG, Chin and Limb EMG, were sampled at 200 Hz.  ECG, Snore and Nasal Pressure, Thermal Airflow, Respiratory Effort, CPAP Flow and Pressure, Oximetry was sampled at 50 Hz. Digital video and audio were recorded.      BASELINE STUDY  Lights Out was at 21:16 and Lights On at 05:00.  Total recording time (TRT) was 465 minutes, with a total sleep time (TST) of  307.5 minutes.   The patient's sleep latency was 125.5 minutes.  REM latency was 143.5 minutes.  The sleep efficiency was 66.1 %.     SLEEP ARCHITECTURE: WASO (Wake after sleep onset) was 78.5 minutes.  There were 34.5 minutes in Stage N1, 127 minutes Stage N2, 86 minutes Stage N3 and 60 minutes in Stage REM.  The percentage of Stage N1 was 11.2%, Stage N2 was 41.3%, Stage N3 was 28.% and Stage R (REM sleep) was 19.5%.   The arousals were noted as: 54 were spontaneous, 0 were associated with PLMs, 0 were associated with respiratory events.  Audio and video analysis did not show any abnormal or unusual movements, behaviors, phonations or vocalizations.    Snoring was noted.     EKG showed normal sinus rhythm (NSR).  RESPIRATORY ANALYSIS:   There were a total of 7 respiratory events:  0 obstructive apneas, 0 central apneas and 0 mixed apneas with a total of 0 apneas and an apnea index (AI) of 0 /hour. There were 7 hypopneas with a hypopnea index of 1.4 /hour. The patient also had 0 respiratory event related arousals (RERAs).       The total APNEA/HYPOPNEA INDEX (AHI) was 1.4/hour and the total RESPIRATORY DISTURBANCE INDEX was 1.4 /hour.  7 events occurred in REM sleep and 0 events in NREM. The REM AHI was 7 /hour, versus a non-REM AHI of 0. The patient spent 37.5 minutes of total sleep time in the supine position and 270 minutes in non-supine.. The supine AHI was 4.8 versus a non-supine AHI of 0.9.  OXYGEN SATURATION:  The Wake baseline 02 saturation was 98%, with the lowest being 89%. Time spent below 89% saturation equaled 0 minutes.  PERIODIC LIMB MOVEMENTS:   The patient had a total of 0 Periodic Limb Movements.  The Periodic Limb Movement (PLM) index was 0 and the PLM Arousal index was 0/hour.  IMPRESSION:  1. There was no significant OSA (AHI normal at 1.4).   Snoring was noted 2. No PLMS was noted 3. Sleep onset latency was delayed at 74.5 minute.  RECOMMENDATIONS:  1. As there was no significant OSA, CPAP is not indicated.    2. If the snoring is troublesome, consider weight loss or an oral appliance 3. Sleep onset latency  was delayed.   This could be due to a 'first night' effect in the sleep lab but if this is typical, consider a sleep aid.   I certify that I have reviewed the entire raw data recording prior to the issuance of this report in accordance with the Standards of Accreditation of the American Academy of Sleep Medicine (AASM)    Juanell Fairly, PhD Diplomat, American Board of Psychiatry and Neurology  Diplomat, American Board of Sleep Medicine     Demographics and Medical History           Name: Kristin Mathews, Kristin Mathews Age: 46 BMI: 41. Interp Physician: Arlice Colt, MD  DOB: 19-Oct-1970 Ht-IN: 9 CM:  170 Referred By: Garret Reddish MD  Pt. Tag:  Wt-LB: 229 KG: 104 Tested By: Bo Mcclintock, RPSGT  Pt. #: [id number] Sex: Female Scored By: Bo Mcclintock, RPSGT  Bed Tag: [id number] Race: Caucasian Occupation: ---  Indication for PS: ---   Sleep Summary    Sleep Time Statistics Minutes Hours    Time in Bed 465    7.8    Total Sleep Time 307.5    5.1    Total Sleep Time NREM 247.5    4.1    Total Sleep Time REM 60    1.0    Sleep Onset 74.5    1.2    Wake After Sleep Onset 78.5    1.3    Wake After Sleep Period 4.5    0.1    Latency Persistent Sleep 125.5    2.1    Sleep Efficiency 66.1 Percent    Lights out 21:16     Lights on 05:00    Sleep Disruption Events Count Index    Arousals 54 10.5    Awakenings 0 0    Arousals + Awakenings 54 10.5    REM Awakenings 0 0.0     Sleep Stage Statistics Wake N1 N2 N3 REM    Percent Stage to SPT 20.3  8.9  32.9  22.3  15.5  Percent   Sleep Period Time in Stage 78.5 34.5 127 86 60 Minutes   Latency to Stage  74.5 126.5 67.5 143.5 Minutes   Percent Stage to TST  11.2 41.3 28. 19.5 Percent   EKG Summary          EKG Statistics         Heart Rate, Wake 90 BPM  TST Epochs in HR Interval 0 < 29   Heart Rate, Steady Sleep Avg 84 BPM   0 30-59   PAC Events 0 Count   148 60-79   PVC Events 0 Count   467 80-99   Bradycardia 0 Count   0 100-119   Tachycardia 0 Count   0 120-139        0 140-159    NREM REM   0 > 160   Shortest R-R .6 .6       Longest R-R .8 .8        Respiration Summary  Event Statistics Total  With Arousal  With Awakening    Count Index  Count Index  Count Index   Apneas, Total 0 0  0 0.0   0 0.0    Hypopneas, Total 7 1.4  0 0.0   0 0.0    Apnea + Hypopnea Index 7 1.4   0 0.0   0 0.0    Apneas, Supine 0 0     Apneas, Non Supine 0 0  Hypopneas, Supine 3 4.8     Hypopneas, Non Supine 4 .9     % Sleep Apnea 0 Percent     % Sleep Hypopnea .5 Percent    Oximetry Statistics       SpO2, Mean Wake 98 Percent      SpO2, Minimum 89 Percent     SpO2, Max 96 Percent     SpO2, Mean 93 Percent            Desaturation Index, REM 3.0  Index     Desaturation Index, NREM 1.5  Index     Desaturation Index, Total 1.8  Index             SpO2 Intervals > 89% 80-89% 70-79% 60-69% 50-59% 40-49% 30-39% < 30%  307.5 Percent Sleep Time 99.7 0.3 0 0 0 0 0 0  Body Position Statistics   Back Side L Side R Side Prone    Total Sleep Time   37.5 270.0 118 152 0 Minutes   Percent Time to TST   12.2  87.8  38.4  49.4  0.0  Percent   Number of Events   3 4.0 3 1 0 Count   Number of Apneas   0 0 0 0 0 Count   Number of Hypopneas   3 4 3 1  0 Count   Apnea Index   0.0  0.0  0.0  0.0  0.0  Index   Hypopnea Index   4.8  0.9  1.5  0.4  0.0  Index   Apnea + Hypopnea Index   4.8  0.9  1.5  0.4  0.0  Index  Respiration Events    Non REM, Pre Rx Statistics Non Supine  Supine    Central Mixed Obstr  Central Mixed Obstr   Apneas 0 0 0  0 0 0 Count  Apneas, Minimum SpO2 0 0 0  0 0 0 Percent     Hypopneas 0 0 0  0 0 0 Count  Hypopneas, Minimum SpO2 0 0 0  0 0 0 Percent     Apnea + Hypopneas Index 0.0 0.0 0.0  0.0 0.0 0.0 Index    REM, Pre Rx Statistics Non Supine  Supine    Central Mixed Obstr  Central Mixed Obstr   Apneas 0 0 0  0 0 0 Count  Apneas, Minimum SpO2 0 0 0  0 0 0 Percent     Hypopneas 0 0 4  0 0 3 Count  Hypopneas, Minimum SpO2 0 0 90  0 0 89 Percent     Apnea + Hypopnea Index 0.0 0.0 5.3  0.0 0.0 12.0 Index  Leg Movement Summary    PLM Non REM (Incl. Wake) REM Total    No Arousal Arousal Wake No Arousal Arousal Wake No Arousal Arousal Wake Total   Isolated 0 0 0 0 0 0 0 0 0 0    PLMS 0 0 0 0 0 0 0 0 0 0    Total 0 0 0 0 0 0 0 0 0 0   PLM Statistics PLMS Total     Count Index Count Index    PLM 0 0 0 0.0     PLM with Arousal 0 0 0 0.0     PLM, with Wake 0 0 0 0.0     PLM, Arousal + Wake 0 0.0 0 0.0     PLM, No Arousal 0 0.0  0 0.0     PLM, Non  REM 0 0.0  0 0.0      PLM, REM 0 0.0  0 0.0     Technician Comments:  Pt scored per 3% hypopnea rule.  Pt only displayed respiratory events in REM sleep, more frequent in supine position.  No PLMs noted.  NSR observed in EKG.  Pt observed in sleep stages N1, N2, N3, REM.  Pt slept in supine and lateral positions.  Some respiratory effort related artifact noted.  No Nocturia.

## 2016-05-18 ENCOUNTER — Telehealth: Payer: Self-pay | Admitting: *Deleted

## 2016-05-18 NOTE — Telephone Encounter (Signed)
LMOM with PSG results as below.  She does not need to return this call unless she would like a referral for an oral appliance, or has questions/fim

## 2016-05-18 NOTE — Telephone Encounter (Signed)
-----   Message from Britt Bottom, MD sent at 05/17/2016  5:59 PM EDT ----- Please let her know that the sleep study did not show any significant amount of sleep apnea. She did have some snoring. This does not need to be treated but if it is troublesome weight loss or an oral appliance could be considered.

## 2016-06-04 ENCOUNTER — Other Ambulatory Visit: Payer: Self-pay | Admitting: Family Medicine

## 2016-06-09 ENCOUNTER — Other Ambulatory Visit: Payer: Self-pay | Admitting: Internal Medicine

## 2016-06-14 DIAGNOSIS — S92405A Nondisplaced unspecified fracture of left great toe, initial encounter for closed fracture: Secondary | ICD-10-CM | POA: Diagnosis not present

## 2016-06-14 DIAGNOSIS — S99922A Unspecified injury of left foot, initial encounter: Secondary | ICD-10-CM | POA: Diagnosis not present

## 2016-06-14 DIAGNOSIS — S92515A Nondisplaced fracture of proximal phalanx of left lesser toe(s), initial encounter for closed fracture: Secondary | ICD-10-CM | POA: Diagnosis not present

## 2016-06-14 DIAGNOSIS — S92412A Displaced fracture of proximal phalanx of left great toe, initial encounter for closed fracture: Secondary | ICD-10-CM | POA: Diagnosis not present

## 2016-06-15 DIAGNOSIS — G35 Multiple sclerosis: Secondary | ICD-10-CM | POA: Diagnosis not present

## 2016-06-21 DIAGNOSIS — B373 Candidiasis of vulva and vagina: Secondary | ICD-10-CM | POA: Diagnosis not present

## 2016-06-21 DIAGNOSIS — Z6837 Body mass index (BMI) 37.0-37.9, adult: Secondary | ICD-10-CM | POA: Diagnosis not present

## 2016-06-21 DIAGNOSIS — Z1212 Encounter for screening for malignant neoplasm of rectum: Secondary | ICD-10-CM | POA: Diagnosis not present

## 2016-06-21 DIAGNOSIS — Z01419 Encounter for gynecological examination (general) (routine) without abnormal findings: Secondary | ICD-10-CM | POA: Diagnosis not present

## 2016-07-11 DIAGNOSIS — G35 Multiple sclerosis: Secondary | ICD-10-CM | POA: Diagnosis not present

## 2016-08-06 ENCOUNTER — Telehealth: Payer: Self-pay | Admitting: Family Medicine

## 2016-08-06 ENCOUNTER — Ambulatory Visit: Payer: BLUE CROSS/BLUE SHIELD | Admitting: Internal Medicine

## 2016-08-06 NOTE — Telephone Encounter (Signed)
Pt need new Rx for duloxetine  Pharm:  [facility]

## 2016-08-07 NOTE — Telephone Encounter (Signed)
Called and left a voicemail message asking for a return phone call. Patient is requesting a medication refill but has not been seen by Dr. Yong Channel since 05/11/15 and Dorothyann Peng on 08/03/15. She must have an appointment prior to refills

## 2016-08-08 ENCOUNTER — Other Ambulatory Visit: Payer: Self-pay

## 2016-08-08 DIAGNOSIS — G35 Multiple sclerosis: Secondary | ICD-10-CM | POA: Diagnosis not present

## 2016-08-08 MED ORDER — DULOXETINE HCL 60 MG PO CPEP: 60.0000 mg | ORAL_CAPSULE | Freq: Every day | ORAL | 0 refills | Status: DC

## 2016-08-08 NOTE — Telephone Encounter (Signed)
Spoke with patient who has scheduled an appointment for August 22, 2016. I am sending in 15 tablets to [facility] to get her through to her appointment

## 2016-08-21 ENCOUNTER — Ambulatory Visit (INDEPENDENT_AMBULATORY_CARE_PROVIDER_SITE_OTHER): Payer: BLUE CROSS/BLUE SHIELD | Admitting: Family Medicine

## 2016-08-21 ENCOUNTER — Other Ambulatory Visit: Payer: Self-pay

## 2016-08-21 ENCOUNTER — Encounter: Payer: Self-pay | Admitting: Family Medicine

## 2016-08-21 VITALS — BP 158/100 | HR 91 | Temp 98.5°F | Ht 67.0 in | Wt 236.2 lb

## 2016-08-21 DIAGNOSIS — Z114 Encounter for screening for human immunodeficiency virus [HIV]: Secondary | ICD-10-CM | POA: Diagnosis not present

## 2016-08-21 DIAGNOSIS — N200 Calculus of kidney: Secondary | ICD-10-CM | POA: Diagnosis not present

## 2016-08-21 DIAGNOSIS — E1151 Type 2 diabetes mellitus with diabetic peripheral angiopathy without gangrene: Secondary | ICD-10-CM

## 2016-08-21 DIAGNOSIS — E785 Hyperlipidemia, unspecified: Secondary | ICD-10-CM | POA: Diagnosis not present

## 2016-08-21 DIAGNOSIS — I10 Essential (primary) hypertension: Secondary | ICD-10-CM | POA: Diagnosis not present

## 2016-08-21 DIAGNOSIS — E1165 Type 2 diabetes mellitus with hyperglycemia: Secondary | ICD-10-CM | POA: Diagnosis not present

## 2016-08-21 DIAGNOSIS — R109 Unspecified abdominal pain: Secondary | ICD-10-CM | POA: Diagnosis not present

## 2016-08-21 DIAGNOSIS — IMO0002 Reserved for concepts with insufficient information to code with codable children: Secondary | ICD-10-CM

## 2016-08-21 DIAGNOSIS — Z0001 Encounter for general adult medical examination with abnormal findings: Secondary | ICD-10-CM

## 2016-08-21 DIAGNOSIS — F325 Major depressive disorder, single episode, in full remission: Secondary | ICD-10-CM

## 2016-08-21 DIAGNOSIS — R10A0 Flank pain, unspecified side: Secondary | ICD-10-CM

## 2016-08-21 LAB — POC URINALSYSI DIPSTICK (AUTOMATED)
Bilirubin, UA: NEGATIVE
GLUCOSE UA: NEGATIVE
KETONES UA: NEGATIVE
Nitrite, UA: NEGATIVE
Urobilinogen, UA: NEGATIVE E.U./dL — AB
pH, UA: 6 (ref 5.0–8.0)

## 2016-08-21 MED ORDER — DULOXETINE HCL 60 MG PO CPEP
60.0000 mg | ORAL_CAPSULE | Freq: Every day | ORAL | 1 refills | Status: DC
Start: 2016-08-21 — End: 2017-02-17

## 2016-08-21 MED ORDER — DULOXETINE HCL 60 MG PO CPEP: 60.0000 mg | ORAL_CAPSULE | Freq: Every day | ORAL | 0 refills | Status: DC

## 2016-08-21 MED ORDER — BLOOD GLUCOSE MONITOR KIT: PACK | 0 refills | Status: DC

## 2016-08-21 MED ORDER — HYDROCODONE-ACETAMINOPHEN 5-325 MG PO TABS: 1.0000 | ORAL_TABLET | Freq: Four times a day (QID) | ORAL | 0 refills | Status: DC | PRN

## 2016-08-21 NOTE — Assessment & Plan Note (Signed)
on pravastatin - felt sick on this but was also starting bydureon so not sure what was cause. Update lipids today.

## 2016-08-21 NOTE — Assessment & Plan Note (Signed)
controlled on amlodipine 10mg  and coreg 6.25mg  BID in past. Uncontrolled initially at 158/100 with kidney ston epain

## 2016-08-21 NOTE — Assessment & Plan Note (Signed)
S:R groin pain starting this Am. Tends to pass them on own. Has seen urology in the past. 3/10 pain but gets worse in waves. Likely nephrolithiasis. vicodin has helped in past. Does not feel well with percocet. Has passed 6 mm stone within about 2 years.  A/P: likely kidney stone with grossly bloody urine and R ggoin pain. Wants to trial passing stone. Vicodin given today #20 for 5 day supply. Reviewed [facility] and only provigil on there as expected from Dr. Felecia Shelling. Can refer to urology if needed. Encouraged increased hydration- declines flomax

## 2016-08-21 NOTE — Patient Instructions (Addendum)
Sign release of information at the check out desk for pap smear from [place], eye report from optho.   Please stop by lab before you go  BP recheck  3 month recheck advised to evaluate blood pressure again  Dual charge possible

## 2016-08-21 NOTE — Assessment & Plan Note (Signed)
Diabetes- follows with endocrine on bydureon weekly- preferred victoza, glipizide 10mg  XR, humalog vgo 40 units a day, and metformin 1000mg  XR Lab Results  Component Value Date   HGBA1C 7.6 01/23/2016  update a1c as has missed a few appointments with Dr. Cruzita Lederer

## 2016-08-21 NOTE — Progress Notes (Signed)
Phone: [phone]  Subjective:  Patient presents today for their annual physical. Chief complaint-noted.   See problem oriented charting- ROS- full  review of systems was completed and negative except for: right groin pain, fatigue at times with MS. No chest pain or shortness of breath  The following were reviewed and entered/updated in epic: Past Medical History:  Diagnosis Date  . Depression   . Diabetes mellitus    Type 2  . Endometriosis   . Hypertension   . Multiple sclerosis ([place])   . Type II or unspecified type diabetes mellitus with peripheral circulatory disorders, uncontrolled(250.72) 11/27/2012   Patient Active Problem List   Diagnosis Date Noted  . Uncontrolled type 2 diabetes mellitus with peripheral circulatory disorder ([place]) 11/27/2012    Priority: High  . Multiple sclerosis ([place]) 07/05/2006    Priority: High  . Depression 02/25/2008    Priority: Medium  . Hyperlipidemia 08/29/2006    Priority: Medium  . Essential hypertension 07/05/2006    Priority: Medium  . Allergic rhinitis 04/27/2014    Priority: Low  . Low back pain 04/27/2014    Priority: Low  . Endometriosis 07/05/2006    Priority: Low  . Nephrolithiasis 08/21/2016  . Gait disturbance 04/17/2016  . Tingling 04/17/2016  . Snoring 04/17/2016  . Urinary urgency 04/17/2016  . Excessive sleepiness 04/17/2016   Past Surgical History:  Procedure Laterality Date  . EXPLORATORY LAPAROTOMY     endometriosis  . LUMBAR DISC SURGERY     LS spine 2005 or so  . WISDOM TOOTH EXTRACTION      Family History  Problem Relation Age of Onset  . Breast cancer Mother        early 64s  . Diabetes Mother        maternal grandmother as well  . Diabetes Father   . Other Father        progressive supranuclear palsy    Medications- reviewed and updated Current Outpatient Prescriptions  Medication Sig Dispense Refill  . amLODipine (NORVASC) 10 MG tablet TAKE 1 TABLET DAILY 90 tablet 3  . carvedilol  (COREG) 6.25 MG tablet TAKE 1 TABLET TWICE A DAY WITH A MEAL 180 tablet 2  . cetirizine (ZYRTEC) 10 MG tablet Take 10 mg by mouth every evening.     . Exenatide ER (BYDUREON) 2 MG PEN INJECT 2 MG UNDER THE SKIN ONCE A WEEK 12 each 1  . glipiZIDE (GLUCOTROL XL) 10 MG 24 hr tablet TAKE 1 TABLET DAILY WITH BREAKFAST 90 tablet 1  . glucose blood (FREESTYLE TEST STRIPS) test strip TEST BLOOD SUGAR TWICE A DAY. VARY TIMES AS DIRECTED. 200 each 3  . HUMALOG 100 UNIT/ML injection USE WITH VGO 40 TO INJECT 76 UNITS UNDER THE SKIN ONCE AS DIRECTED 70 mL 0  . Insulin Disposable Pump (V-GO 40) KIT USE DAILY 90 kit 0  . levonorgestrel-ethinyl estradiol (LYBREL,AMETHYST) 90-20 MCG tablet Take 1 tablet by mouth daily.     . metFORMIN (GLUCOPHAGE-XR) 500 MG 24 hr tablet TAKE 2 TABLETS (1,000 MG TOTAL) TWICE A DAY WITH A MEAL 360 tablet 1  . modafinil (PROVIGIL) 200 MG tablet Take 1 tablet (200 mg total) by mouth daily as needed. 90 tablet 1  . Naproxen Sodium 220 MG CAPS Take 440 mg by mouth 2 (two) times daily as needed (for pain.).     Marland Kitchen natalizumab (TYSABRI) 300 MG/15ML injection Inject 300 mg into the vein every 30 (thirty) days.    . blood glucose meter kit  and supplies KIT Use to test blood sugar two times a day. FREESTYLE Dx:E11.9 1 each 0  . DULoxetine (CYMBALTA) 60 MG capsule Take 1 capsule (60 mg total) by mouth daily. 90 capsule 1  . HYDROcodone-acetaminophen (NORCO/VICODIN) 5-325 MG tablet Take 1 tablet by mouth every 6 (six) hours as needed for moderate pain or severe pain. 20 tablet 0   No current facility-administered medications for this visit.     Allergies-reviewed and updated Allergies  Allergen Reactions  . Ace Inhibitors Swelling    Edema    Social History   Social History  . Marital status: Single    Spouse name: N/A  . Number of children: N/A  . Years of education: N/A   Social History Main Topics  . Smoking status: Never Smoker  . Smokeless tobacco: Never Used  . Alcohol  use 0.0 oz/week     Comment: occasional  . Drug use: No  . Sexual activity: Not Asked   Other Topics Concern  . None   Social History Narrative   Single. Lives alone. No children. Pet cat.       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 Oropharynx normal Neck: no thyromegaly CV: RRR no murmurs rubs or gallops Lungs: CTAB no crackles, wheeze, rhonchi Abdomen: soft/nontender other than pain in RLQ - but palpation only mildly worsens/nondistended/normal bowel sounds. No rebound or guarding. obese Ext: trace edema Skin: warm, dry Neuro: grossly normal, moves all extremities, PERRLA  Diabetic Foot Exam - Simple   Simple Foot Form Diabetic Foot exam was performed with the following findings:  Yes 08/21/2016  3:55 PM  Visual Inspection No deformities, no ulcerations, no other skin breakdown bilaterally:  Yes Sensation Testing Intact to touch and monofilament testing bilaterally:  Yes Pulse Check Posterior Tibialis and Dorsalis pulse intact bilaterally:  Yes Comments      Assessment/Plan:  46 y.o. female presenting for annual physical.  Health Maintenance counseling: 1. Anticipatory guidance: Patient counseled regarding regular dental exams q6 months, eye exams - yearly, wearing seatbelts.  2. Risk factor reduction:  Advised patient of need for regular exercise and diet rich and fruits and vegetables to reduce risk of heart attack and stroke. Exercise- has had some barriers such as fractures after falls, kidney stones at  present, etc- encouraged 150 minutes a week. Diet-tough with travel. Has a sweet tooth and states hard to cut off even with the diabetes.  Wt Readings from Last 3 Encounters:  08/21/16 236 lb 3.2 oz (107.1 kg)  04/17/16 230 lb 8 oz (104.6 kg)  01/23/16 231 lb (104.8 kg)   3. Immunizations/screenings/ancillary studies Immunization History  Administered Date(s) Administered  . Influenza Whole 10/14/2008  . Influenza,inj,Quad PF,36+ Mos 11/02/2013  . Pneumococcal Polysaccharide-23 01/16/2007  . Tdap 01/16/2007   Health Maintenance Due  Topic Date Due  . HIV Screening - screen 07/30/1984  . URINE MICROALBUMIN - today 04/30/2015  . OPHTHALMOLOGY  EXAM - get records  09/23/2015  . FOOT EXAM - today 05/10/2016  . HEMOGLOBIN A1C - today 07/22/2016  . INFLUENZA VACCINE - advised in fall 08/15/2016   4. Cervical cancer screening- Sees Dr. Runell Gess, need updated records- had within a year 5. Breast cancer screening-  breast exam with GYN and mammogram - 11/17/15 mammogram with [facility] 6. Colon cancer screening - no family history, start at age 72. Apparently had one 5 years ago due to hemorrhoids and incontinence  7. Skin cancer screening- - denies worrisome skin lesions, uses sunscreen   Status of chronic or acute concerns   MS- follows with Dr. Felecia Shelling and on Tysabri. Also on provigil with Dr. Felecia Shelling  Technically morbid obesity- spent some time discussing weigh tloss but motivation seems low  Nephrolithiasis S:R groin pain starting this Am. Tends to pass them on own. Has seen urology in the past. 3/10 pain but gets worse in waves. Likely nephrolithiasis. vicodin has helped in past. Does not feel well with percocet. Has passed 6 mm stone within about 2 years.  A/P: likely kidney stone with grossly bloody urine and R ggoin pain. Wants to trial passing stone. Vicodin given today #20 for 5 day supply. Reviewed [facility] and only provigil on there as expected from Dr. Felecia Shelling. Can refer to urology if  needed. Encouraged increased hydration- declines flomax   Uncontrolled type 2 diabetes mellitus with peripheral circulatory disorder Diabetes- follows with endocrine on bydureon weekly- preferred victoza, glipizide 80m XR, humalog vgo 40 units a day, and metformin 10063mXR Lab Results  Component Value Date   HGBA1C 7.6 01/23/2016  update a1c as has missed a few appointments with Dr. GhCruzita Lederer Essential hypertension controlled on amlodipine 1036mnd coreg 6.96m51mD in past. Uncontrolled initially at 158/100 with kidney ston epain  Hyperlipidemia  on pravastatin - felt sick on this but was also starting bydureon so not sure what was cause. Update lipids today.   Depression On cymbalta. Symptoms reasonably controlled. Not a happy person off of this. n othoughts of self harm  3 month recheck advised to evaluate blood pressure again  Orders Placed This Encounter  Procedures  . CBC    [place]  . Comprehensive metabolic panel    [place]    Order Specific Question:   Has the patient fasted?    Answer:   No  . Lipid panel    [place]    Order Specific Question:   Has the patient fasted?    Answer:   No  . Hemoglobin A1c    [place]  . Microalbumin / creatinine urine ratio      . HIV antibody  . POCT Urinalysis Dipstick (Automated)    Meds ordered this encounter  Medications  . HYDROcodone-acetaminophen (NORCO/VICODIN) 5-325 MG tablet    Sig: Take 1 tablet by mouth every 6 (six) hours as needed for moderate pain or severe pain.    Dispense:  20 tablet    Refill:  0    Return precautions advised.  StepGarret Reddish

## 2016-08-21 NOTE — Assessment & Plan Note (Signed)
On cymbalta. Symptoms reasonably controlled. Not a happy person off of this. n othoughts of self harm

## 2016-08-22 ENCOUNTER — Other Ambulatory Visit: Payer: Self-pay | Admitting: Family Medicine

## 2016-08-22 LAB — LIPID PANEL
CHOL/HDL RATIO: 4
Cholesterol: 192 mg/dL (ref 0–200)
HDL: 47.5 mg/dL (ref >39.00)
LDL Cholesterol: 105 mg/dL — ABNORMAL HIGH (ref 0–99)
NONHDL: 144.63
Triglycerides: 200 mg/dL — ABNORMAL HIGH (ref 0.0–149.0)
VLDL: 40 mg/dL (ref 0.0–40.0)

## 2016-08-22 LAB — COMPREHENSIVE METABOLIC PANEL
ALT: 17 U/L (ref 0–35)
AST: 12 U/L (ref 0–37)
Albumin: 4.1 g/dL (ref 3.5–5.2)
Alkaline Phosphatase: 91 U/L (ref 39–117)
BUN: 13 mg/dL (ref 6–23)
CO2: 31 meq/L (ref 19–32)
Calcium: 9.5 mg/dL (ref 8.4–10.5)
Chloride: 102 mEq/L (ref 96–112)
Creatinine, Ser: 0.92 mg/dL (ref 0.40–1.20)
GFR: 69.53 mL/min (ref >60.00)
GLUCOSE: 167 mg/dL — AB (ref 70–99)
Potassium: 5 mEq/L (ref 3.5–5.1)
SODIUM: 140 meq/L (ref 135–145)
Total Bilirubin: 0.4 mg/dL (ref 0.2–1.2)
Total Protein: 7.1 g/dL (ref 6.0–8.3)

## 2016-08-22 LAB — HIV ANTIBODY (ROUTINE TESTING W REFLEX): HIV 1&2 Ab, 4th Generation: NONREACTIVE

## 2016-08-22 LAB — MICROALBUMIN / CREATININE URINE RATIO
CREATININE, U: 254 mg/dL
Microalb Creat Ratio: 9.6 mg/g (ref 0.0–30.0)
Microalb, Ur: 24.3 mg/dL — ABNORMAL HIGH (ref 0.0–1.9)

## 2016-08-22 LAB — CBC
HCT: 44.9 % (ref 36.0–46.0)
HEMOGLOBIN: 14.7 g/dL (ref 12.0–15.0)
MCHC: 32.8 g/dL (ref 30.0–36.0)
MCV: 88.3 fl (ref 78.0–100.0)
Platelets: 384 10*3/uL (ref 150.0–400.0)
RBC: 5.09 Mil/uL (ref 3.87–5.11)
RDW: 14.3 % (ref 11.5–15.5)
WBC: 12.7 10*3/uL — ABNORMAL HIGH (ref 4.0–10.5)

## 2016-08-22 LAB — HEMOGLOBIN A1C: HEMOGLOBIN A1C: 8.3 % — AB (ref 4.6–6.5)

## 2016-08-22 MED ORDER — PRAVASTATIN SODIUM 20 MG PO TABS: 20.0000 mg | ORAL_TABLET | Freq: Every day | ORAL | 3 refills | Status: DC

## 2016-08-22 NOTE — Progress Notes (Signed)
Pravastatin trial

## 2016-09-09 ENCOUNTER — Other Ambulatory Visit: Payer: Self-pay | Admitting: Internal Medicine

## 2016-09-10 ENCOUNTER — Other Ambulatory Visit: Payer: Self-pay | Admitting: Internal Medicine

## 2016-09-12 DIAGNOSIS — G35 Multiple sclerosis: Secondary | ICD-10-CM | POA: Diagnosis not present

## 2016-09-17 ENCOUNTER — Other Ambulatory Visit: Payer: Self-pay | Admitting: Family Medicine

## 2016-10-09 ENCOUNTER — Encounter: Payer: Self-pay | Admitting: Internal Medicine

## 2016-10-09 ENCOUNTER — Ambulatory Visit (INDEPENDENT_AMBULATORY_CARE_PROVIDER_SITE_OTHER): Payer: BLUE CROSS/BLUE SHIELD | Admitting: Internal Medicine

## 2016-10-09 VITALS — BP 138/82 | HR 94 | Wt 235.0 lb

## 2016-10-09 DIAGNOSIS — Z23 Encounter for immunization: Secondary | ICD-10-CM | POA: Diagnosis not present

## 2016-10-09 DIAGNOSIS — E1151 Type 2 diabetes mellitus with diabetic peripheral angiopathy without gangrene: Secondary | ICD-10-CM

## 2016-10-09 DIAGNOSIS — IMO0001 Reserved for inherently not codable concepts without codable children: Secondary | ICD-10-CM

## 2016-10-09 DIAGNOSIS — E669 Obesity, unspecified: Secondary | ICD-10-CM

## 2016-10-09 DIAGNOSIS — E1165 Type 2 diabetes mellitus with hyperglycemia: Secondary | ICD-10-CM

## 2016-10-09 DIAGNOSIS — G35 Multiple sclerosis: Secondary | ICD-10-CM | POA: Diagnosis not present

## 2016-10-09 DIAGNOSIS — Z6836 Body mass index (BMI) 36.0-36.9, adult: Secondary | ICD-10-CM

## 2016-10-09 DIAGNOSIS — IMO0002 Reserved for concepts with insufficient information to code with codable children: Secondary | ICD-10-CM

## 2016-10-09 MED ORDER — V-GO 30 KIT: PACK | 5 refills | Status: DC

## 2016-10-09 MED ORDER — LIRAGLUTIDE 18 MG/3ML ~~LOC~~ SOPN: PEN_INJECTOR | SUBCUTANEOUS | 3 refills | Status: DC

## 2016-10-09 MED ORDER — INSULIN LISPRO 100 UNIT/ML ~~LOC~~ SOLN: SUBCUTANEOUS | 3 refills | Status: DC

## 2016-10-09 NOTE — Patient Instructions (Addendum)
Please continue: - Metformin ER 1000 mg 2x a day - Glipizide XL 10 mg in am (if you have lows mid-day, we will need to decrease this)  Please change: - VGo to 30 and also change: 1 push for 15 g carbs  Please stop Bydureon and start Victoza 1.8 mg daily.  Please return in 1.5 months with your sugar log.

## 2016-10-09 NOTE — Addendum Note (Signed)
Addended by: Caprice Beaver T on: 10/09/2016 11:18 AM   Modules accepted: Orders

## 2016-10-09 NOTE — Progress Notes (Signed)
Patient ID: Kristin Mathews, female   DOB: March 07, 1970, 46 y.o.   MRN: [id number]  HPI: Kristin Mathews is a 46 y.o.-year-old female, returning for f/u for DM2, dx 2009, insulin-dependent since 2009, uncontrolled, without long term complications. Last visit 8 mo ago.  Her last HbA1c was higher 2/2 dietary indiscretions. Since then, she started to monitor her sugars more >> sugars improved.   Last hemoglobin A1c was: Lab Results  Component Value Date   HGBA1C 8.3 (H) 08/21/2016   HGBA1C 7.6 01/23/2016   HGBA1C 9.0 09/26/2015   She uses frequent steroid courses for MS.  She is on: - Metformin ER 1000 mg 2x a day - Glipizide XL 10 mg in am  - Bydureon 2 mg weekly - VGo 40  - ICR 1:5 (1 push for 10g carbs). Prev.: - Breakfast: 3-4 >> 2 pushes - Lunch: 7 >> 5 pushes - Dinner: 7 >> 7-10 pushes - Snacks: 2 pushes She was on Invokana 100 >> 300 >> had few yeast infections - stopped in 05/2013 and tried again in 2017 >> stopped again b/c yeast inf. We stopped Humulin 70/30 25 units bid. Januvia - did not help.  Glumetza not covered by insurance.  Pt checks her sugars 0-1x a day: - am:   64-117, 150, 163 >> 69-131, 150 >> 74-136, 170 - 2h after b'fast:  148 >> 147-193 >> 166 >> n/c >> 194 - lunch:  130s >> n/c >> 82-148, 172, 203 >> n/c - 2h after lunch: 105-194, 237, 295 >> 169-206, 273 >> n/c >> n/c - dinner: 116, 139, 179, 237 >> 45, 47, 55, 74-137, 283 >> 64-156, 187 - 2h after dinner:170-230 >> 153-272 >> 165 >> 208 >> 91, 208 - bedtime:  150s >> 122-246 >> 134, 152, 314 >> 123, 131 - nighttime: 41, 43, 60, 134 >> 47-187 Lowest CBG: 21!! (at night), 64 (injected too much insulin with dinner) >> 41 >> 47; she has hypoglycemia awareness at 50s.  Highest 200s >> 246 >> 314 (potato) >> 276.  Pt's meals are: - Breakfast: yoghurt + granola, cereals (3-4 pushes) - Lunch: sandwich, sushi, [place] (7 pushes) - Dinner: home cooked meal: stews, etc.(7 pushes) - Snacks: 3-4 (2 pushes)   - no  CKD, last BUN/creatinine:  Lab Results  Component Value Date   BUN 13 08/21/2016   CREATININE 0.92 08/21/2016  Was on Benazepril.. Angioedema from ACEI 12/19/2012! - last set of lipids: Lab Results  Component Value Date   CHOL 192 08/21/2016   HDL 47.50 08/21/2016   LDLCALC 105 (H) 08/21/2016   LDLDIRECT 125.0 02/22/2015   TRIG 200.0 (H) 08/21/2016   CHOLHDL 4 08/21/2016  On Pravachol 20. - last eye exam was 07/2016 >> No DR. Allergies. - + mildnumbness and tingling in her feet. Foot exam normal by PCP 04/2016.  She also has a history of MS (neuro Dr Arlice Colt) - last 2 atacks: 02/2012, 2010; also HTN, borderline HL, fatty liver, endometriosis.  I reviewed pt's medications, allergies, PMH, social hx, family hx, and changes were documented in the history of present illness. Otherwise, unchanged from my initial visit note.  ROS: Constitutional: + weight gain/no weight loss, + fatigue, + subjective hyperthermia, no subjective hypothermia Eyes: + blurry vision, no xerophthalmia ENT: no sore throat, no nodules palpated in throat, no dysphagia, no odynophagia, no hoarseness Cardiovascular: no CP/no SOB/no palpitations/+ leg swelling Respiratory: no cough/no SOB/no wheezing Gastrointestinal: no N/no V/+D/+ C/no acid reflux Musculoskeletal: no muscle aches/no  joint aches Skin: no rashes, no hair loss Neurological: no tremors/no numbness/no tingling/no dizziness  I reviewed pt's medications, allergies, PMH, social hx, family hx, and changes were documented in the history of present illness. Otherwise, unchanged from my initial visit note.  PE: BP 138/82 (BP Location: Left Arm, Patient Position: Sitting)   Pulse 94   Wt 235 lb (106.6 kg)   SpO2 97%   BMI 36.81 kg/m  Body mass index is 36.81 kg/m. Wt Readings from Last 3 Encounters:  10/09/16 235 lb (106.6 kg)  08/21/16 236 lb 3.2 oz (107.1 kg)  04/17/16 230 lb 8 oz (104.6 kg)   Constitutional: overweight, in NAD Eyes:  PERRLA, EOMI, no exophthalmos ENT: moist mucous membranes, no thyromegaly, no cervical lymphadenopathy Cardiovascular: tachycardia, RR, + 1/6 SEM, no RG Respiratory: CTA B Gastrointestinal: abdomen soft, NT, ND, BS+ Musculoskeletal: no deformities, strength intact in all 4 Skin: moist, warm, no rashes Neurological: no tremor with outstretched hands, DTR normal in all 4   ASSESSMENT: 1. DM2, insulin-dependent, uncontrolled, with hyperglycemia  She saw the diabetes educator for a discussion about insulin pumps >> decided for a VGo  2. Obesity class 2 BMI Classification:  < 18.5 underweight   18.5-24.9 normal weight   25.0-29.9 overweight   30.0-34.9 class I obesity   35.0-39.9 class II obesity   ? 40.0 class III obesity    PLAN:  1. Patient with a h/o uncontrolled DM2, exacerbated by steroid courses for MS and many traveling assignments for work ([place], [place], [place]), with higher sugars before her last HbA1c in 08/2016, now improved after starting to check her sugars consistently and making changes in her diet.  - her sugars are even low at times, w/o a clear trigger >> will decrease the VGo to 30  And also decrease the coluses from an ICR of 1:10 to 1:15.  - she would like to go back to Victoza as her insurance now covers it >> will stop Bydureaon - if she continues to improve, we may stop the VGo and use a lower dose basal insulin and no rapid-acting insulin -  I advised her to:  Patient Instructions  Please continue: - Metformin ER 1000 mg 2x a day - Glipizide XL 10 mg in am (if you have lows mid-day, we will need to decrease this)  Please change: - VGo to 30 and also change: 1 push for 15 g carbs  Please stop Bydureon and start Victoza 1.8 mg daily.  Please return in 1.5 months with your sugar log.   - reviewed last HbA1c >> higher, 8.3% - continue checking sugars at different times of the day - check 3x a day, rotating checks - advised for yearly eye exams >>  she is UTD - Return to clinic in 1.5 mo with sugar log   2. Obesity class 2 - gained weight since last visit - will reduce the insulin dose and restart Victoza, which worked for her better in the past   [name], MD PhD [facility] Endocrinology

## 2016-10-15 ENCOUNTER — Other Ambulatory Visit: Payer: Self-pay

## 2016-10-15 MED ORDER — LIRAGLUTIDE 18 MG/3ML ~~LOC~~ SOPN: PEN_INJECTOR | SUBCUTANEOUS | 3 refills | Status: DC

## 2016-10-17 ENCOUNTER — Encounter: Payer: Self-pay | Admitting: *Deleted

## 2016-11-03 ENCOUNTER — Other Ambulatory Visit: Payer: Self-pay | Admitting: Internal Medicine

## 2016-11-14 ENCOUNTER — Encounter (HOSPITAL_BASED_OUTPATIENT_CLINIC_OR_DEPARTMENT_OTHER): Payer: Self-pay

## 2016-11-14 ENCOUNTER — Emergency Department (HOSPITAL_BASED_OUTPATIENT_CLINIC_OR_DEPARTMENT_OTHER): Payer: BLUE CROSS/BLUE SHIELD

## 2016-11-14 ENCOUNTER — Emergency Department (HOSPITAL_BASED_OUTPATIENT_CLINIC_OR_DEPARTMENT_OTHER)
Admission: EM | Admit: 2016-11-14 | Discharge: 2016-11-14 | Disposition: A | Discharge status: Home / Self Care | Payer: BLUE CROSS/BLUE SHIELD | Attending: Emergency Medicine | Admitting: Emergency Medicine

## 2016-11-14 DIAGNOSIS — Z79899 Other long term (current) drug therapy: Secondary | ICD-10-CM | POA: Insufficient documentation

## 2016-11-14 DIAGNOSIS — G35 Multiple sclerosis: Secondary | ICD-10-CM | POA: Insufficient documentation

## 2016-11-14 DIAGNOSIS — E119 Type 2 diabetes mellitus without complications: Secondary | ICD-10-CM | POA: Diagnosis not present

## 2016-11-14 DIAGNOSIS — I10 Essential (primary) hypertension: Secondary | ICD-10-CM | POA: Diagnosis not present

## 2016-11-14 DIAGNOSIS — R109 Unspecified abdominal pain: Secondary | ICD-10-CM

## 2016-11-14 DIAGNOSIS — Z7984 Long term (current) use of oral hypoglycemic drugs: Secondary | ICD-10-CM | POA: Diagnosis not present

## 2016-11-14 DIAGNOSIS — N200 Calculus of kidney: Secondary | ICD-10-CM | POA: Diagnosis not present

## 2016-11-14 DIAGNOSIS — R1030 Lower abdominal pain, unspecified: Secondary | ICD-10-CM | POA: Diagnosis not present

## 2016-11-14 DIAGNOSIS — R1084 Generalized abdominal pain: Secondary | ICD-10-CM | POA: Diagnosis not present

## 2016-11-14 LAB — URINALYSIS, MICROSCOPIC (REFLEX)

## 2016-11-14 LAB — URINALYSIS, ROUTINE W REFLEX MICROSCOPIC
Bilirubin Urine: NEGATIVE
Glucose, UA: 250 mg/dL — AB
KETONES UR: 15 mg/dL — AB
LEUKOCYTES UA: NEGATIVE
NITRITE: NEGATIVE
PH: 6 (ref 5.0–8.0)
Protein, ur: 100 mg/dL — AB
Specific Gravity, Urine: >1.03 — ABNORMAL HIGH (ref 1.005–1.030)

## 2016-11-14 LAB — BASIC METABOLIC PANEL
Anion gap: 9 (ref 5–15)
BUN: 11 mg/dL (ref 6–20)
CALCIUM: 8.6 mg/dL — AB (ref 8.9–10.3)
CHLORIDE: 102 mmol/L (ref 101–111)
CO2: 25 mmol/L (ref 22–32)
CREATININE: 0.91 mg/dL (ref 0.44–1.00)
Glucose, Bld: 163 mg/dL — ABNORMAL HIGH (ref 65–99)
Potassium: 3.8 mmol/L (ref 3.5–5.1)
SODIUM: 136 mmol/L (ref 135–145)

## 2016-11-14 LAB — CBC
HCT: 40.6 % (ref 36.0–46.0)
Hemoglobin: 13.4 g/dL (ref 12.0–15.0)
MCH: 28.3 pg (ref 26.0–34.0)
MCHC: 33 g/dL (ref 30.0–36.0)
MCV: 85.7 fL (ref 78.0–100.0)
PLATELETS: 365 10*3/uL (ref 150–400)
RBC: 4.74 MIL/uL (ref 3.87–5.11)
RDW: 14.1 % (ref 11.5–15.5)
WBC: 11.3 10*3/uL — AB (ref 4.0–10.5)

## 2016-11-14 LAB — PREGNANCY, URINE: Preg Test, Ur: NEGATIVE

## 2016-11-14 MED ORDER — HYDROCODONE-ACETAMINOPHEN 5-325 MG PO TABS: 1.0000 | ORAL_TABLET | ORAL | 0 refills | Status: DC | PRN

## 2016-11-14 MED ORDER — HYDROCODONE-ACETAMINOPHEN 5-325 MG PO TABS
1.0000 | ORAL_TABLET | Freq: Once | ORAL | Status: DC
  Filled 2016-11-14: qty 1

## 2016-11-14 MED ORDER — ONDANSETRON 4 MG PO TBDP: ORAL_TABLET | ORAL | 0 refills | Status: DC

## 2016-11-14 MED ORDER — TAMSULOSIN HCL 0.4 MG PO CAPS: 0.4000 mg | ORAL_CAPSULE | Freq: Every day | ORAL | 0 refills | Status: DC

## 2016-11-14 MED ORDER — KETOROLAC TROMETHAMINE 30 MG/ML IJ SOLN
15.0000 mg | Freq: Once | INTRAMUSCULAR | Status: AC
  Administered 2016-11-14: 15 mg via INTRAVENOUS
  Filled 2016-11-14: qty 1

## 2016-11-14 MED FILL — ONDANSETRON ODT 4 MG TABLET: 4 | 2 days supply | Qty: 9 | Fill #0

## 2016-11-14 MED FILL — TAMSULOSIN HCL 0.4 MG CAP: 0.4 | 30 days supply | Qty: 30 | Fill #0

## 2016-11-14 MED FILL — HYDROCODON-APAP 5-325: 5-325 | 2 days supply | Qty: 10 | Fill #0

## 2016-11-14 NOTE — ED Provider Notes (Signed)
[facility] EMERGENC[facility] DEPARTMENT Provider Note   CSN: [id number] Arrival date & time: 11/14/16  1238     History   Chief Complaint Chief Complaint  Patient presents with  . Flank Pain    HPI   Kristin Mathews is a 46 y.o. Female with a history of diabetes, hypertension, MS, previous kidney stones, who presents complaining of right-sided flank pain that radiates to the groin. Patient reports this feels exactly like when she's had her previous kidney stones, which have all been on the right in the past. Patient reports pain started yesterday morning when she woke up, she describes it as sharp stabbing in her right flank, as well as some pain and discomfort in the lower abdomen and groin. No left-sided pain, and no pain in the upper abdomen. She denies any associated nausea vomiting. No associated fevers or chills, no dysuria, hematuria or frequency.       Past Medical History:  Diagnosis Date  . Depression   . Diabetes mellitus    Type 2  . Endometriosis   . Hypertension   . Multiple sclerosis ([place])   . Type II or unspecified type diabetes mellitus with peripheral circulatory disorders, uncontrolled(250.72) 11/27/2012    Patient Active Problem List   Diagnosis Date Noted  . Class 2 obesity with serious comorbidity and body mass index (BMI) of 36.0 to 36.9 in adult 10/09/2016  . Nephrolithiasis 08/21/2016  . Gait disturbance 04/17/2016  . Tingling 04/17/2016  . Snoring 04/17/2016  . Urinary urgency 04/17/2016  . Excessive sleepiness 04/17/2016  . Allergic rhinitis 04/27/2014  . Low back pain 04/27/2014  . Uncontrolled type 2 diabetes mellitus with peripheral circulatory disorder ([place]) 11/27/2012  . Depression 02/25/2008  . Hyperlipidemia 08/29/2006  . Multiple sclerosis ([place]) 07/05/2006  . Essential hypertension 07/05/2006  . Endometriosis 07/05/2006    Past Surgical History:  Procedure Laterality Date  . EXPLORATORY LAPAROTOMY     endometriosis  .  LUMBAR DISC SURGERY     LS spine 2005 or so  . WISDOM TOOTH EXTRACTION      OB History    No data available       Home Medications    Prior to Admission medications   Medication Sig Start Date End Date Taking? Authorizing Provider  amLODipine (NORVASC) 10 MG tablet TAKE 1 TABLET DAILY 05/07/16   Marin Olp, MD  blood glucose meter kit and supplies KIT Use to test blood sugar two times a day. FREESTYLE Dx:E11.9 08/21/16   Marin Olp, MD  carvedilol (COREG) 6.25 MG tablet TAKE 1 TABLET TWICE A DAY WITH A MEAL 09/26/15   [name], MD  cetirizine (ZYRTEC) 10 MG tablet Take 10 mg by mouth every evening.     [provider]  DULoxetine (CYMBALTA) 60 MG capsule Take 1 capsule (60 mg total) by mouth daily. 08/21/16   Marin Olp, MD  glipiZIDE (GLUCOTROL XL) 10 MG 24 hr tablet TAKE 1 TABLET DAILY WITH BREAKFAST 11/05/16   [name], MD  glucose blood (FREESTYLE TEST STRIPS) test strip TEST BLOOD SUGAR TWICE A DAY. VARY TIMES AS DIRECTED. 03/14/16   [name], MD  HYDROcodone-acetaminophen (NORCO/VICODIN) 5-325 MG tablet Take 1 tablet by mouth every 6 (six) hours as needed for moderate pain or severe pain. 08/21/16   Marin Olp, MD  Insulin Disposable Pump (V-GO 30) KIT Use 1x a day 10/09/16   [name], MD  insulin lispro (HUMALOG) 100 UNIT/ML injection USE WITH  VGO 30 TO INJECT 66 UNITS UNDER THE SKIN ONCE AS DIRECTED 10/09/16   [name], MD  levonorgestrel-ethinyl estradiol (LYBREL,AMETHYST) 90-20 MCG tablet Take 1 tablet by mouth daily.     [provider]  liraglutide (VICTOZA) 18 MG/3ML SOPN Use 1.8 mg under skin 10/15/16   [name], MD  metFORMIN (GLUCOPHAGE-XR) 500 MG 24 hr tablet TAKE 2 TABLETS (1,000 MG TOTAL) TWICE A DAY WITH A MEAL 09/10/16   [name], MD  modafinil (PROVIGIL) 200 MG tablet Take 1 tablet (200 mg total) by mouth daily as needed. 04/10/16   Sater, Nanine Means, MD  Naproxen Sodium  220 MG CAPS Take 440 mg by mouth 2 (two) times daily as needed (for pain.).     [provider]  natalizumab (TYSABRI) 300 MG/15ML injection Inject 300 mg into the vein every 30 (thirty) days.    [provider]  pravastatin (PRAVACHOL) 20 MG tablet Take 1 tablet (20 mg total) by mouth daily. 08/22/16   Marin Olp, MD    Family History Family History  Problem Relation Age of Onset  . Breast cancer Mother        early 11s  . Diabetes Mother        maternal grandmother as well  . Diabetes Father   . Other Father        progressive supranuclear palsy    Social History Social History  Substance Use Topics  . Smoking status: Never Smoker  . Smokeless tobacco: Never Used  . Alcohol use 0.0 oz/week     Comment: occasional     Allergies   Ace inhibitors   Review of Systems Review of Systems  Constitutional: Negative for chills and fever.  Respiratory: Negative for cough, chest tightness and shortness of breath.   Cardiovascular: Negative for chest pain.  Gastrointestinal: Positive for abdominal pain. Negative for blood in stool, diarrhea, nausea and vomiting.  Genitourinary: Positive for flank pain. Negative for dysuria, frequency, urgency, vaginal bleeding and vaginal discharge.  Musculoskeletal: Positive for back pain.  Skin: Negative for pallor and rash.  All other systems reviewed and are negative.    Physical Exam Updated Vital Signs BP (!) 160/105 (BP Location: Left Arm)   Pulse 98   Temp 98 F (36.7 C) (Oral)   Resp 18   Ht _0  (1.676 m)   Wt 106.2 kg (234 lb 2.1 oz)   SpO2 98%   BMI 37.79 kg/m   Physical Exam  Constitutional: She is oriented to person, place, and time. She appears well-developed and well-nourished. No distress.  HENT:  Head: Normocephalic and atraumatic.  Eyes: Right eye exhibits no discharge. Left eye exhibits no discharge.  Cardiovascular: Normal rate, regular rhythm and normal heart sounds.   Pulmonary/Chest:  Effort normal and breath sounds normal. No respiratory distress. She has no wheezes. She has no rales.  Abdominal: Soft. Bowel sounds are normal. She exhibits no distension and no mass. There is no tenderness. There is no guarding.  No CVA tenderness, minimal tenderness over right flank  Neurological: She is alert and oriented to person, place, and time. Coordination normal.  Skin: Skin is warm and dry. Capillary refill takes less than 2 seconds. She is not diaphoretic.  Psychiatric: She has a normal mood and affect. Her behavior is normal.  Nursing note and vitals reviewed.    ED Treatments / Results  Labs (all labs ordered are listed, but only abnormal results are displayed) Labs Reviewed  URINALYSIS, ROUTINE  W REFLEX MICROSCOPIC - Abnormal; Notable for the following:       Result Value   APPearance CLOUDY (*)    Specific Gravity, Urine >1.030 (*)    Glucose, UA 250 (*)    Hgb urine dipstick LARGE (*)    Ketones, ur 15 (*)    Protein, ur 100 (*)    All other components within normal limits  URINALYSIS, MICROSCOPIC (REFLEX) - Abnormal; Notable for the following:    Bacteria, UA MANY (*)    Squamous Epithelial / LPF 6-30 (*)    All other components within normal limits  BASIC METABOLIC PANEL - Abnormal; Notable for the following:    Glucose, Bld 163 (*)    Calcium 8.6 (*)    All other components within normal limits  CBC - Abnormal; Notable for the following:    WBC 11.3 (*)    All other components within normal limits  URINE CULTURE  PREGNANCY, URINE    EKG  EKG Interpretation None       Radiology Dg Abdomen 1 View  Result Date: 11/14/2016 CLINICAL DATA:  History urinary tract stones. EXAM: ABDOMEN - 1 VIEW COMPARISON:  CT scan of the abdomen of Jun 09, 2014 FINDINGS: There is a coarse calcification measuring approximately 6 x 8 mm that overlies the tip of the right L4 transverse process. No definite stones overlie either kidney. No left ureteral stones or bladder  stones are observed. The colonic stool burden is moderate. The bony structures exhibit no acute abnormality. An insulin pump is present over the right flank. IMPRESSION: 6 x 8 mm calcification on the right likely reflects a proximal ureteral stone. Electronically Signed   By: David  Martinique M.D.   On: 11/14/2016 16:10    Procedures Procedures (including critical care time)  Medications Ordered in ED Medications  HYDROcodone-acetaminophen (NORCO/VICODIN) 5-325 MG per tablet 1 tablet (1 tablet Oral Not Given 11/14/16 1515)  ketorolac (TORADOL) 30 MG/ML injection 15 mg (15 mg Intravenous Given 11/14/16 1656)     Initial Impression / Assessment and Plan / ED Course  I have reviewed the triage vital signs and the nursing notes.  Pertinent labs & imaging results that were available during my care of the patient were reviewed by me and considered in my medical decision making (see chart for details).  She presents with right flank pain, symptoms are consistent with all of her previous kidney stones. Patient is well-appearing and vitals are normal. Patient does not exhibit nausea or vomiting. No fever, or dysuria. 6 x 8 mm stone noted on abdominal x-ray. Creatinine is normal. Urinalysis shows some hematuria, this does not appear to be a clean catch as there are many squams, microscopic shows bacteria and WBCs, but there are no nitrates or leukocytes present in the urine, these are likely due to contamination, urine culture was sent, but given lack of urinary symptoms do not feel that there is an infection present.   Had shared decision making discussion with pt regarding CT scan given that these symptoms are identical to those with previous stones and patient has passed stones of similar size in the past. Pt is in agreement to defer scan at this time.  Pain well controlled in the ED. Pt stable for discharge home with pain medication and follow-up with urology. Return precautions discussed. Pt expresses  understanding and agrees with plan.  Patient discussed with Dr. Regenia Skeeter, who saw patient as well and agrees with plan.   Final Clinical Impressions(s) /  ED Diagnoses   Final diagnoses:  Kidney stone  Right flank pain    New Prescriptions Discharge Medication List as of 11/14/2016  5:11 PM    START taking these medications   Details  !! HYDROcodone-acetaminophen (NORCO) 5-325 MG tablet Take 1 tablet by mouth every 4 (four) hours as needed., Starting Wed 11/14/2016, Print    ondansetron (ZOFRAN ODT) 4 MG disintegrating tablet 55m ODT q4 hours prn nausea/vomit, Print    tamsulosin (FLOMAX) 0.4 MG CAPS capsule Take 1 capsule (0.4 mg total) by mouth daily., Starting Wed 11/14/2016, Print     !! - Potential duplicate medications found. Please discuss with provider.       FJacqlyn Larsen PA-C 11/14/16 1[place] DRhodhiss DO 11/15/16 1601

## 2016-11-14 NOTE — Discharge Instructions (Signed)
Use pain medications as needed to treat symptoms, as well as Flomax and Zofran. Continue to strain urine at home. Please schedule an appointment with your urologist for follow-up. If you develop fevers, dysuria, are unable to pass urine, or pain is unmanageable please return to the emergency department for reevaluation.

## 2016-11-14 NOTE — ED Notes (Signed)
ED Provider at bedside. 

## 2016-11-14 NOTE — ED Notes (Signed)
Patient claimed that she had the same symptoms before when she had a kidney stones.  She was able to pass the kidney stone before.

## 2016-11-14 NOTE — ED Triage Notes (Signed)
C/o right flank, right lower abd pain started yesterday-states feels like kidney stone-NAD-steady gait

## 2016-11-15 DIAGNOSIS — G35 Multiple sclerosis: Secondary | ICD-10-CM | POA: Diagnosis not present

## 2016-11-15 LAB — URINE CULTURE

## 2016-11-16 ENCOUNTER — Other Ambulatory Visit: Payer: Self-pay | Admitting: Urology

## 2016-11-16 ENCOUNTER — Encounter (HOSPITAL_COMMUNITY): Payer: Self-pay | Admitting: General Practice

## 2016-11-16 DIAGNOSIS — N132 Hydronephrosis with renal and ureteral calculous obstruction: Secondary | ICD-10-CM | POA: Diagnosis not present

## 2016-11-16 DIAGNOSIS — N201 Calculus of ureter: Secondary | ICD-10-CM | POA: Diagnosis not present

## 2016-11-16 NOTE — H&P (Signed)
Office Visit Report     11/16/2016   --------------------------------------------------------------------------------   Kristin Mathews  MRN: [id number]  PRIMARY CARE:  Kristin Mathews. Kristin Spry, MD  DOB: 07/31/1970, 46 year old Female  REFERRING:  Kristin L. Helane Rima, MD  SSN: -[phone]  PROVIDER:  Rana Snare, M.D.    TREATING:  Jimmey Ralph    LOCATION:  [facility] [phone]   --------------------------------------------------------------------------------   CC: I have ureteral stone.  HPI: Kristin Mathews is a 46 year-old female established patient who is here for ureteral stone.  Was seen in [facility] ER 11/14/16. Had KUB and was told stone was in the  "right tube outside of kidney." Pain is not well controlled with PRN Vicodin. Has some constipation issues now secondary to pain meds.   Concerned about traveling to [place] Sunday for work.   The problem is on the right side. She first stated noticing pain on approximately 11/13/2016. This is not her first kidney stone. She is currently having flank pain and back pain. She denies having groin pain, nausea, vomiting, fever, and chills. Pain is occuring on the left side. She has not caught a stone in her urine strainer since her symptoms began.   She has never had surgical treatment for calculi in the past.     ALLERGIES: ACE Inhibitors - Trouble Breathing, Swelling, angioedema    MEDICATIONS: Vicodin  Aleve 220 mg tablet Oral  Amlodipine Besylate 5 mg tablet Oral  Birth Control Pill  Carvedilol 3.125 mg tablet Oral  Cymbalta 30 mg capsule,delayed release Oral  Glipizide Xl 10 mg tablet, extended release 24 hr Oral  Humalog  Metformin Hcl 1,000 mg tablet Oral  Tysabri 300 mg/15 ml vial Intravenous  Victoza 3-Pak 0.6 mg/0.1 ml (18 mg/3 ml) pen injector Subcutaneous     GU PSH: None     PSH Notes: .   NON-GU PSH: Diag Laparo Separate Proc - April 16, 2014 Diagnostic Colonoscopy - 04-16-14    GU PMH: Dysuria, Dysuria -  04/16/14 Renal calculus, Nephrolithiasis - Apr 16, 2014 Ureteral calculus, Calculus of right ureter - 04/16/14 Gross hematuria, Gross hematuria - 2014-04-16    NON-GU PMH: Encounter for general adult medical examination without abnormal findings, Encounter for preventive health examination - 04-16-14 Personal history of other diseases of the circulatory system, History of hypertension - 04-16-2014, History of cardiac murmur, - 04/16/14 Personal history of other diseases of the nervous system and sense organs, History of multiple sclerosis - 2014-04-16 Personal history of other endocrine, nutritional and metabolic disease, History of diabetes mellitus - 04-16-14, History of hypercholesterolemia, - 16-Apr-2014 Personal history of other mental and behavioral disorders, History of depression - 04-16-2014    FAMILY HISTORY: Death - Father Kidney Stones - Runs In Family malignant neoplasm of breast - Runs In Family PSP (progressive supranuclear palsy) - Runs In Family   SOCIAL HISTORY: Marital Status: Single Preferred Language: English; Ethnicity: Not Hispanic Or Latino; Race: White Current Smoking Status: Patient has never smoked.   Tobacco Use Assessment Completed: Used Tobacco in last 30 days? Does not use smokeless tobacco. Social Drinker.  Does not use drugs. Drinks 1 caffeinated drink per day.    REVIEW OF SYSTEMS:    GU Review Female:   Patient reports frequent urination. Patient denies hard to postpone urination, burning /pain with urination, get up at night to urinate, leakage of urine, stream starts and stops, trouble starting your stream, have to strain to urinate, and being pregnant.  Gastrointestinal (Upper):  Patient denies nausea, vomiting, and indigestion/ heartburn.  Gastrointestinal (Lower):   Patient reports constipation. Patient denies diarrhea.  Constitutional:   Patient denies fever, night sweats, weight loss, and fatigue.  Skin:   Patient denies skin rash/ lesion and itching.  Eyes:   Patient denies blurred vision and  double vision.  Ears/ Nose/ Throat:   Patient denies sore throat and sinus problems.  Hematologic/Lymphatic:   Patient denies swollen glands and easy bruising.  Cardiovascular:   Patient denies leg swelling and chest pains.  Respiratory:   Patient denies cough and shortness of breath.  Endocrine:   Patient denies excessive thirst.  Musculoskeletal:   Patient reports back pain. Patient denies joint pain.  Neurological:   Patient denies headaches and dizziness.  Psychologic:   Patient denies depression and anxiety.   VITAL SIGNS:      11/16/2016 11:39 AM  Weight 230 lb / 104.33 kg  Height 66 in / 167.64 cm  BP 138/86 mmHg  Pulse 93 /min  Temperature 98.4 F / 36.8 C  BMI 37.1 kg/m   MULTI-SYSTEM PHYSICAL EXAMINATION:    Constitutional: Obese. Appears older than their stated age. No physical deformities. Good grooming.   Respiratory: Normal breath sounds. No labored breathing, no use of accessory muscles.   Cardiovascular: Regular rate and rhythm. No murmur, no gallop. Normal temperature, normal extremity pulses, no swelling, no varicosities.   Skin: No paleness, no jaundice, no cyanosis. No lesion, no ulcer, no rash.   Neurologic / Psychiatric: Oriented to time, oriented to place, oriented to person. No depression, no anxiety, no agitation.   Gastrointestinal: Obese abdomen. No mass, no tenderness, no rigidity. No CVAT.   Musculoskeletal: Normal gait and station of head and neck.     PAST DATA REVIEWED:  Source Of History:  Patient  Lab Test Review:   Stone Analysis  Records Review:   Previous Patient Records  Urine Test Review:   Urinalysis, Urine Culture  Notes:                     June 2016 stone analysis calcium oxalate  11/14/16 urine culture  multiple species   PROCEDURES:         C.T. Urogram - [id number]      Mild right hydronephrosis and dilated ureter to lever of 5 mm right mid ureteral calculus. No distal calcification noted. This CT was reviewed by Dr. Pilar Jarvis and  he concurs.          Renal Ultrasound (Limited) - [id number]  Right Kidney: Length:10.3 cm Depth: 6.2 cm Cortical Width: 1.1 cm Width: 5.8 cm    Right Kidney/Ureter:  Hydronephrosis noted. ------- Multiple subcentimeter echogenic foci without shadowing with the largest measuring 0.5 cm in the mid pole ( calcifications vs vessels).   Bladder:  PVR = 10.9 ml.      Mild hydronephrosis noted.          KUB - [id number]  A single view of the abdomen is obtained.      Previous KUB 11/14/16 could clearly seen stone at right L4. Today no definitive stone noted but there is a very faint possible calcification at right L5 and no definitive distal right calculus noted along expected course of ureter. Dr. Pilar Jarvis did review KUB and concurs that stone appears to be at L4.          Catheter / SP Tube - 51701 In and Out Catheterization  A 14 French red rubber or straight  catheter was inserted into the bladder using sterile technique. A urinalysis was sent to the lab.         Urinalysis w/Scope - 81001 Dipstick Dipstick Cont'd Micro  Specimen: Staff Cath Bilirubin: Neg WBC/hpf: 0 - 5/hpf  Color: Yellow Ketones: 1+ RBC/hpf: >60/hpf  Appearance: Cloudy Blood: 3+ Bacteria: Mod (26-50/hpf)  Specific Gravity: 1.025 Protein: 2+ Cystals: Ca Oxalate  pH: 6.0 Urobilinogen: 0.2 Casts: NS (Not Seen)  Glucose: Neg Nitrites: Neg Trichomonas: Not Present    Leukocyte Esterase: Neg Mucous: Not Present      Epithelial Cells: NS (Not Seen)      Yeast: NS (Not Seen)      Sperm: Not Present         Notes:   With no definitive stone seen in KUB and mild hydro noted on RUS will proceed with CT urogram since pt is suppose to leave Sunday 11/18/16 for [place] for 7 days.    ASSESSMENT:      ICD-10 Details  1 GU:   Ureteral calculus - N20.1 Right, Stable, Acute - Dr. Pilar Jarvis did review KUB and agrees that stone is at L5 and can be seen faintly. CT urogram confirms stone still present and pt would like to proceed with ESWL  ASAP. She will double up on Tamsulosin 0.4 mg daily and given Hydromophone 2 mg 1 po Q6 hrs prn for severe pain. Instructed to resolve constipation with Miralax daily.   2   Ureteral obstruction secondary to calculous - N13.2 Right, Acute - Secondary to mid right ureteral stone.    PLAN:            Medications New Meds: Cephalexin 500 mg capsule 1 capsule PO TID   #15  0 Refill(s)    Refill Meds: Flomax 0.4 mg capsule, ext release 24 hr 1 capsule PO Q PM   #30  0 Refill(s)  Hydromorphone Hcl 2 mg tablet 1 tablet PO Q 6 H PRN   #20  0 Refill(s)            Orders X-Rays: C.T. Stone Protocol Without Contrast    Renal Ultrasound (Limited) - (R)    KUB  X-Ray Notes: History:  Hematuria: Yes/No  Patient to see MD after exam: Yes/No  Previous exam: CT / IVP/ US/ KUB/ None  When:  Where:  Diabetic: Yes/ No  BUN/ Creatinine:  Date of last BUN Creatinine:  Weight in pounds:  Allergy- IV Contrast: Yes/ No  Conflicting diabetic meds: Yes/ No  Diabetic Meds:  Prior Authorization #: [id number]           Schedule Procedure: 11/16/2016 at [facility] - [id number] - In and Out Catheterization (Catheterize For Residual) - 51701          Document Letter(s):  Created for Patient: Clinical Summary         Notes:   For observation I described the risks which include but are not limited to: silent renal damage, life-threatening infection, need for emergent surgery,failure to pass stone, and pain.   For ureteroscopy I described the risks which include heart attack, stroke, pulmonary embolus, death, bleeding, infection, damage to contiguous structures, positioning injury, ureteral stricture, ureteral avulsion, ureteral injury, need for ureteral stent, inability to perform ureteroscopy, need for an interval procedure, inability to clear stone burden, stent discomfort and pain.   For shockwave lithotripsy I described the risks which include arrhythmia, kidney  contusion, kidney hemorrhage, need for transfusion, back discomfort, flank ecchymosis, flank  abrasion, inability to break up stone, inability to pass stone fragments, Steinstrasse, infection associated with obstructing stones, need for different surgical procedure and possible need for repeat shockwave lithotripsy. She is willing to proceed.          The information contained in this medical record document is considered private and confidential patient information. This information can only be used for the medical diagnosis and/or medical services that are being provided by the patient's selected caregivers. This information can only be distributed outside of the patient's care if the patient agrees and signs waivers of authorization for this information to be sent to an outside source or route.  I discussed with NP Rosalyn Gess and agree with her assessment and plan. I reviewed the chart, labs and images. 10/31 urine cx was mixed, so we started abx over weekend as a precaution.

## 2016-11-19 ENCOUNTER — Encounter (HOSPITAL_COMMUNITY)
Admission: RE | Disposition: A | Discharge status: Home / Self Care | Payer: Self-pay | Source: Ambulatory Visit | Attending: Urology

## 2016-11-19 ENCOUNTER — Ambulatory Visit (HOSPITAL_COMMUNITY)
Admission: RE | Admit: 2016-11-19 | Discharge: 2016-11-19 | Disposition: A | Discharge status: Home / Self Care | Payer: BLUE CROSS/BLUE SHIELD | Source: Ambulatory Visit | Attending: Urology | Admitting: Urology

## 2016-11-19 ENCOUNTER — Ambulatory Visit (HOSPITAL_COMMUNITY): Payer: BLUE CROSS/BLUE SHIELD

## 2016-11-19 ENCOUNTER — Encounter (HOSPITAL_COMMUNITY): Payer: Self-pay | Admitting: General Practice

## 2016-11-19 DIAGNOSIS — K5900 Constipation, unspecified: Secondary | ICD-10-CM | POA: Diagnosis not present

## 2016-11-19 DIAGNOSIS — E119 Type 2 diabetes mellitus without complications: Secondary | ICD-10-CM | POA: Insufficient documentation

## 2016-11-19 DIAGNOSIS — Z794 Long term (current) use of insulin: Secondary | ICD-10-CM | POA: Insufficient documentation

## 2016-11-19 DIAGNOSIS — F329 Major depressive disorder, single episode, unspecified: Secondary | ICD-10-CM | POA: Diagnosis not present

## 2016-11-19 DIAGNOSIS — N201 Calculus of ureter: Secondary | ICD-10-CM | POA: Diagnosis not present

## 2016-11-19 DIAGNOSIS — N200 Calculus of kidney: Secondary | ICD-10-CM | POA: Diagnosis not present

## 2016-11-19 DIAGNOSIS — I10 Essential (primary) hypertension: Secondary | ICD-10-CM | POA: Diagnosis not present

## 2016-11-19 DIAGNOSIS — G35 Multiple sclerosis: Secondary | ICD-10-CM | POA: Insufficient documentation

## 2016-11-19 DIAGNOSIS — E7800 Pure hypercholesterolemia, unspecified: Secondary | ICD-10-CM | POA: Diagnosis not present

## 2016-11-19 DIAGNOSIS — Z79899 Other long term (current) drug therapy: Secondary | ICD-10-CM | POA: Insufficient documentation

## 2016-11-19 HISTORY — DX: Unspecified osteoarthritis, unspecified site: M19.90

## 2016-11-19 HISTORY — PX: EXTRACORPOREAL SHOCK WAVE LITHOTRIPSY: SHX1557

## 2016-11-19 HISTORY — DX: Personal history of urinary calculi: Z87.442

## 2016-11-19 LAB — GLUCOSE, CAPILLARY: GLUCOSE-CAPILLARY: 213 mg/dL — AB (ref 65–99)

## 2016-11-19 SURGERY — LITHOTRIPSY, ESWL
Anesthesia: LOCAL | Laterality: Right

## 2016-11-19 MED ORDER — DIPHENHYDRAMINE HCL 25 MG PO CAPS
25.0000 mg | ORAL_CAPSULE | ORAL | Status: AC
  Administered 2016-11-19: 25 mg via ORAL
  Filled 2016-11-19: qty 1

## 2016-11-19 MED ORDER — SODIUM CHLORIDE 0.9 % IV SOLN
INTRAVENOUS | Status: DC
  Administered 2016-11-19: 10:00:00 via INTRAVENOUS

## 2016-11-19 MED ORDER — CIPROFLOXACIN HCL 500 MG PO TABS
500.0000 mg | ORAL_TABLET | ORAL | Status: AC
  Administered 2016-11-19: 500 mg via ORAL
  Filled 2016-11-19: qty 1

## 2016-11-19 MED ORDER — DIAZEPAM 5 MG PO TABS
10.0000 mg | ORAL_TABLET | ORAL | Status: AC
Start: 2016-11-19 — End: 2016-11-19
  Administered 2016-11-19: 10 mg via ORAL
  Filled 2016-11-19: qty 2

## 2016-11-19 NOTE — Op Note (Signed)
Right Proximal stone, 8 mm  Right ESWL  Findings; Pt tolerated well. Stone fragmented well.

## 2016-11-19 NOTE — Interval H&P Note (Signed)
History and Physical Interval Note:  11/19/2016 11:09 AM  [name]  has presented today for surgery, with the diagnosis of RIGHT URETERAL STONE  The various methods of treatment have been discussed with the patient and family. After consideration of risks, benefits and other options for treatment, the patient has consented to  Procedure(s): RIGHT EXTRACORPOREAL SHOCK WAVE LITHOTRIPSY (ESWL) (Right) as a surgical intervention .  The patient's history has been reviewed, patient examined, no change in status, stable for surgery.  I have reviewed the patient's chart and labs. Office urine culture , no growth. Pt doing well, no fever. No stone pass. Stone remains in right proximal ureter on KUB today and readily visible on flouro. Questions were answered to the patient's satisfaction.     Kristin Mathews

## 2016-11-19 NOTE — Discharge Instructions (Signed)
Lithotripsy, Care After °This sheet gives you information about how to care for yourself after your procedure. Your health care provider may also give you more specific instructions. If you have problems or questions, contact your health care provider. °What can I expect after the procedure? °After the procedure, it is common to have: °· Some blood in your urine. This should only last for a few days. °· Soreness in your back, sides, or upper abdomen for a few days. °· Blotches or bruises on your back where the pressure wave entered the skin. °· Pain, discomfort, or nausea when pieces (fragments) of the kidney stone move through the tube that carries urine from the kidney to the bladder (ureter). Stone fragments may pass soon after the procedure, but they may continue to pass for up to 4-8 weeks. °? If you have severe pain or nausea, contact your health care provider. This may be caused by a large stone that was not broken up, and this may mean that you need more treatment. °· Some pain or discomfort during urination. °· Some pain or discomfort in the lower abdomen or (in men) at the base of the penis. ° °Follow these instructions at home: °Medicines °· Take over-the-counter and prescription medicines only as told by your health care provider. °· If you were prescribed an antibiotic medicine, take it as told by your health care provider. Do not stop taking the antibiotic even if you start to feel better. °· Do not drive for 24 hours if you were given a medicine to help you relax (sedative). °· Do not drive or use heavy machinery while taking prescription pain medicine. °Eating and drinking °· Drink enough water and fluids to keep your urine clear or pale yellow. This helps any remaining pieces of the stone to pass. It can also help prevent new stones from forming. °· Eat plenty of fresh fruits and vegetables. °· Follow instructions from your health care provider about eating and drinking restrictions. You may be  instructed: °? To reduce how much salt (sodium) you eat or drink. Check ingredients and nutrition facts on packaged foods and beverages. °? To reduce how much meat you eat. °· Eat the recommended amount of calcium for your age and gender. Ask your health care provider how much calcium you should have. °General instructions °· Get plenty of rest. °· Most people can resume normal activities 1-2 days after the procedure. Ask your health care provider what activities are safe for you. °· If directed, strain all urine through the strainer that was provided by your health care provider. °? Keep all fragments for your health care provider to see. Any stones that are found may be sent to a medical lab for examination. The stone may be as small as a grain of salt. °· Keep all follow-up visits as told by your health care provider. This is important. °Contact a health care provider if: °· You have pain that is severe or does not get better with medicine. °· You have nausea that is severe or does not go away. °· You have blood in your urine longer than your health care provider told you to expect. °· You have more blood in your urine. °· You have pain during urination that does not go away. °· You urinate more frequently than usual and this does not go away. °· You develop a rash or any other possible signs of an allergic reaction. °Get help right away if: °· You have severe pain in   your back, sides, or upper abdomen. °· You have severe pain while urinating. °· Your urine is very dark red. °· You have blood in your stool (feces). °· You cannot pass any urine at all. °· You feel a strong urge to urinate after emptying your bladder. °· You have a fever or chills. °· You develop shortness of breath, difficulty breathing, or chest pain. °· You have severe nausea that leads to persistent vomiting. °· You faint. °Summary °· After this procedure, it is common to have some pain, discomfort, or nausea when pieces (fragments) of the  kidney stone move through the tube that carries urine from the kidney to the bladder (ureter). If this pain or nausea is severe, however, you should contact your health care provider. °· Most people can resume normal activities 1-2 days after the procedure. Ask your health care provider what activities are safe for you. °· Drink enough water and fluids to keep your urine clear or pale yellow. This helps any remaining pieces of the stone to pass, and it can help prevent new stones from forming. °· If directed, strain your urine and keep all fragments for your health care provider to see. Fragments or stones may be as small as a grain of salt. °· Get help right away if you have severe pain in your back, sides, or upper abdomen or have severe pain while urinating. °This information is not intended to replace advice given to you by your health care provider. Make sure you discuss any questions you have with your health care provider. °Document Released: 01/21/2007 Document Revised: 11/23/2015 Document Reviewed: 11/23/2015 °Elsevier Interactive Patient Education © 2017 [employer] ° °

## 2016-11-23 DIAGNOSIS — N201 Calculus of ureter: Secondary | ICD-10-CM | POA: Diagnosis not present

## 2016-12-03 ENCOUNTER — Ambulatory Visit: Payer: BLUE CROSS/BLUE SHIELD | Admitting: Internal Medicine

## 2016-12-13 DIAGNOSIS — G35 Multiple sclerosis: Secondary | ICD-10-CM | POA: Diagnosis not present

## 2016-12-27 ENCOUNTER — Other Ambulatory Visit: Payer: Self-pay

## 2016-12-27 ENCOUNTER — Telehealth: Payer: Self-pay | Admitting: Internal Medicine

## 2016-12-27 MED ORDER — LIRAGLUTIDE 18 MG/3ML ~~LOC~~ SOPN: PEN_INJECTOR | SUBCUTANEOUS | 3 refills | Status: DC

## 2016-12-27 NOTE — Telephone Encounter (Signed)
Patient has to have a 90 day supply of victoza in order for insurance will cover it  Send to  [facility] on [phone]

## 2016-12-27 NOTE — Telephone Encounter (Signed)
This prescription has been sent for 90 days to [facility] on [address].

## 2017-01-09 DIAGNOSIS — G35 Multiple sclerosis: Secondary | ICD-10-CM | POA: Diagnosis not present

## 2017-01-10 DIAGNOSIS — N201 Calculus of ureter: Secondary | ICD-10-CM | POA: Diagnosis not present

## 2017-01-10 LAB — BASIC METABOLIC PANEL
BUN: 7 (ref 4–21)
Creatinine: 0.5 (ref 0.5–1.1)
Glucose: 141
Potassium: 3.8 (ref 3.4–5.3)
Sodium: 139 (ref 137–147)

## 2017-01-21 ENCOUNTER — Other Ambulatory Visit: Payer: Self-pay | Admitting: Internal Medicine

## 2017-01-24 ENCOUNTER — Encounter: Payer: Self-pay | Admitting: Family Medicine

## 2017-01-28 ENCOUNTER — Other Ambulatory Visit: Payer: Self-pay

## 2017-01-28 MED ORDER — LIRAGLUTIDE 18 MG/3ML ~~LOC~~ SOPN: PEN_INJECTOR | SUBCUTANEOUS | 3 refills | Status: DC

## 2017-01-31 ENCOUNTER — Ambulatory Visit: Payer: BLUE CROSS/BLUE SHIELD | Admitting: Internal Medicine

## 2017-01-31 ENCOUNTER — Encounter: Payer: Self-pay | Admitting: Internal Medicine

## 2017-01-31 VITALS — BP 140/86 | HR 84 | Ht 67.0 in | Wt 235.6 lb

## 2017-01-31 DIAGNOSIS — E669 Obesity, unspecified: Secondary | ICD-10-CM

## 2017-01-31 DIAGNOSIS — E1165 Type 2 diabetes mellitus with hyperglycemia: Secondary | ICD-10-CM | POA: Diagnosis not present

## 2017-01-31 DIAGNOSIS — IMO0002 Reserved for concepts with insufficient information to code with codable children: Secondary | ICD-10-CM

## 2017-01-31 DIAGNOSIS — E1151 Type 2 diabetes mellitus with diabetic peripheral angiopathy without gangrene: Secondary | ICD-10-CM | POA: Diagnosis not present

## 2017-01-31 LAB — POCT GLYCOSYLATED HEMOGLOBIN (HGB A1C): Hemoglobin A1C: 9

## 2017-01-31 NOTE — Addendum Note (Signed)
Addended by: Regina Coppolino on: 01/31/2017 11:21 AM   Modules accepted: Orders  

## 2017-01-31 NOTE — Patient Instructions (Addendum)
Please continue: - Metformin ER 1000 mg twice a day - Glipizide XL 10 mg in am  Restart: - Victoza 1.8 mg daily in a.m.  Change to: - VGo 40 - 1 click for 37D carbs (may need 1 click for 10 g carbs)  Please return in 4 months with your sugar log.

## 2017-01-31 NOTE — Progress Notes (Signed)
Patient ID: Kristin Mathews, female   DOB: March 09, 1970, 47 y.o.   MRN: [id number]  HPI: Kristin Mathews is a 47 y.o.-year-old female, returning for f/u for DM2, dx 2009, insulin-dependent since 2009, uncontrolled, without long term complications. Last visit 4 mo ago.  Since last visit she had a kidney stone in 11/2016 (calcium oxalate)- had litotripsy. Cut down spinach intake since then.  He tells me she had a total of 20-25 kidney stones throughout her life.   Last hemoglobin A1c was: Lab Results  Component Value Date   HGBA1C 8.3 (H) 08/21/2016   HGBA1C 7.6 01/23/2016   HGBA1C 9.0 09/26/2015  She uses frequent steroid courses for MS.  She is on: - Metformin ER 1000 mg twice a day - Glipizide XL 10 mg in am - Victoza 1.8 mg daily in a.m. >> Bydureon 2 mg weekly in last 1.5 mo - but she received Victoza recently and plans to start - VGo 30 - 1 click for 39J carbs She was on Invokana 100 >> 300 >> had few yeast infections - stopped in 05/2013 and tried again in 2017 >> stopped again b/c yeast inf. We stopped Humulin 70/30 25 units bid. Januvia - did not help.  Glumetza not covered by insurance.  Pt checks her sugars x a day:- - am:  69-131, 150 >> 74-136, 170 >> 94-142, 176 - 2h after b'fast: 166 >> n/c >> 194 - lunch: 82-148, 172, 203 >> n/c >> 160, 219 - 2h after lunch:169-206, 273 >> n/c >> n/c >> 253 - dinner: 45, 47, 55, 74-137, 283 >> 64-156, 187 >> 83, 104-201 - 2h after dinner:165 >> 208 >> 91, 208 >> 138-191 - bedtime:  134, 152, 314 >> 123, 131 >> 260 - nighttime: 41, 43, 60, 134 >> 47-187 >> 145 Lowest CBG: 21!! (at night) ... >> 48; she has hypoglycemia awareness in the 41s.  Highest 276 >> 260 (forgot to bolus).  Pt's meals are: - Breakfast: yoghurt + granola, cereals (3-4 pushes) - Lunch: sandwich, sushi, [place] (7 pushes) - Dinner: home cooked meal: stews, etc.(7 pushes) - Snacks: 3-4 (2 pushes)   - No CKD, last BUN/creatinine:  Lab Results  Component Value Date    BUN 7 01/10/2017   CREATININE 0.5 01/10/2017  Was on benazepril. Angioedema from ACEI 12/19/2012! - + HL; last set of lipids: Lab Results  Component Value Date   CHOL 192 08/21/2016   HDL 47.50 08/21/2016   LDLCALC 105 (H) 08/21/2016   LDLDIRECT 125.0 02/22/2015   TRIG 200.0 (H) 08/21/2016   CHOLHDL 4 08/21/2016  On Pravachol 20. - last eye exam was 07/2016: No DR. Allergies. -  + Mild numbness and tingling in her feet. Foot exam normal by PCP 04/2016.  She also has a history of MS (neuro Dr Arlice Colt) - last 2 atacks: 02/2012, 2010; also HTN, fatty liver, endometriosis.  She travels a lot for her job.  Next month she will go to [place] and then [place].  After this, [place].  ROS: Constitutional: no weight gain/no weight loss, + fatigue, no subjective hyperthermia, no subjective hypothermia Eyes: no blurry vision, no xerophthalmia ENT: no sore throat, no nodules palpated in throat, no dysphagia, no odynophagia, no hoarseness Cardiovascular: no CP/no SOB/no palpitations/no leg swelling Respiratory: no cough/no SOB/no wheezing Gastrointestinal: no N/no V/+ D/+ C/no acid reflux Musculoskeletal: no muscle aches/no joint aches Skin: no rashes, no hair loss Neurological: no tremors/no numbness/no tingling/no dizziness  I reviewed pt's  medications, allergies, PMH, social hx, family hx, and changes were documented in the history of present illness. Otherwise, unchanged from my initial visit note.  PE: BP 140/86   Pulse 84   Ht 5\' 7"  (1.702 m)   Wt 235 lb 9.6 oz (106.9 kg)   SpO2 96%   BMI 36.90 kg/m  Body mass index is 36.9 kg/m. Wt Readings from Last 3 Encounters:  01/31/17 235 lb 9.6 oz (106.9 kg)  11/19/16 232 lb (105.2 kg)  11/14/16 234 lb 2.1 oz (106.2 kg)   Constitutional: Obese, in NAD Eyes: PERRLA, EOMI, no exophthalmos ENT: moist mucous membranes, no thyromegaly, no cervical lymphadenopathy Cardiovascular: RRR, No RG, + 1/6 SEM Respiratory: CTA  B Gastrointestinal: abdomen soft, NT, ND, BS+ Musculoskeletal: no deformities, strength intact in all 4 Skin: moist, warm, no rashes Neurological: no tremor with outstretched hands, DTR normal in all 4  ASSESSMENT: 1. DM2, insulin-dependent, uncontrolled, with hyperglycemia  She saw the diabetes educator for a discussion about insulin pumps >> decided for a VGo  2. Obesity class 2 BMI Classification:  < 18.5 underweight   18.5-24.9 normal weight   25.0-29.9 overweight   30.0-34.9 class I obesity   35.0-39.9 class II obesity   ? 40.0 class III obesity    PLAN:  1. 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  At last visit, sugars were better after changing her diet, therefore, we switched to a Vgo 30 and increased her insulin to carb ratio. - At this visit, sugars are higher due to more hyperglycemic spikes - Victoza was not covered last year so she had to go back to Bydureon, however, she now got back Victoza and she plans to restart - By mistake, she received a refill of VGo 40 reservoirs instead of 30, but due to the higher blood sugars, we discussed about changing back to the VGo40 for now she has a 53-month supply. -  I advised her to:  Patient Instructions  Please continue: - Metformin ER 1000 mg twice a day - Glipizide XL 10 mg in am  Restart: - Victoza 1.8 mg daily in a.m.  Change to: - VGo 40 - 1 click for 76H carbs (may need 1 click for 10 g carbs)  Please return in 4 months with your sugar log.   - today, HbA1c is 9% (higher) - continue checking sugars at different times of the day - check 3x a day, rotating checks - advised for yearly eye exams >> she is UTD - Return to clinic in 4 mo with sugar log   2. Obesity class 2 - Gained a little weight since last visit over the holidays - Restart Victoza which should help with weight  loss, also  Kristin Kingdom, MD PhD [facility]

## 2017-02-13 ENCOUNTER — Other Ambulatory Visit: Payer: Self-pay | Admitting: Neurology

## 2017-02-13 DIAGNOSIS — G35 Multiple sclerosis: Secondary | ICD-10-CM

## 2017-02-13 NOTE — Addendum Note (Signed)
Addended by: Inis Sizer D on: 02/13/2017 01:34 PM   Modules accepted: Orders

## 2017-02-14 LAB — CBC WITH DIFFERENTIAL/PLATELET
BASOS ABS: 0.1 10*3/uL (ref 0.0–0.2)
BASOS: 1 %
EOS (ABSOLUTE): 0.6 10*3/uL — ABNORMAL HIGH (ref 0.0–0.4)
EOS: 4 %
HEMATOCRIT: 42.5 % (ref 34.0–46.6)
HEMOGLOBIN: 14.3 g/dL (ref 11.1–15.9)
Immature Grans (Abs): 0.1 10*3/uL (ref 0.0–0.1)
Immature Granulocytes: 1 %
LYMPHS ABS: 5 10*3/uL — AB (ref 0.7–3.1)
LYMPHS: 40 %
MCH: 28.8 pg (ref 26.6–33.0)
MCHC: 33.6 g/dL (ref 31.5–35.7)
MCV: 86 fL (ref 79–97)
MONOCYTES: 4 %
Monocytes Absolute: 0.5 10*3/uL (ref 0.1–0.9)
NEUTROS ABS: 6.3 10*3/uL (ref 1.4–7.0)
Neutrophils: 50 %
Platelets: 356 10*3/uL (ref 150–379)
RBC: 4.96 x10E6/uL (ref 3.77–5.28)
RDW: 14.6 % (ref 12.3–15.4)
WBC: 12.5 10*3/uL — ABNORMAL HIGH (ref 3.4–10.8)

## 2017-02-17 ENCOUNTER — Other Ambulatory Visit: Payer: Self-pay | Admitting: Family Medicine

## 2017-02-22 ENCOUNTER — Encounter: Payer: Self-pay | Admitting: *Deleted

## 2017-03-05 ENCOUNTER — Encounter: Payer: Self-pay | Admitting: Family Medicine

## 2017-03-05 ENCOUNTER — Ambulatory Visit: Payer: BLUE CROSS/BLUE SHIELD | Admitting: Family Medicine

## 2017-03-05 VITALS — BP 132/84 | HR 95 | Temp 97.9°F | Ht 67.0 in | Wt 235.2 lb

## 2017-03-05 DIAGNOSIS — D899 Disorder involving the immune mechanism, unspecified: Secondary | ICD-10-CM

## 2017-03-05 DIAGNOSIS — J329 Chronic sinusitis, unspecified: Secondary | ICD-10-CM | POA: Diagnosis not present

## 2017-03-05 DIAGNOSIS — R509 Fever, unspecified: Secondary | ICD-10-CM

## 2017-03-05 DIAGNOSIS — B9689 Other specified bacterial agents as the cause of diseases classified elsewhere: Secondary | ICD-10-CM | POA: Diagnosis not present

## 2017-03-05 DIAGNOSIS — D849 Immunodeficiency, unspecified: Secondary | ICD-10-CM | POA: Insufficient documentation

## 2017-03-05 LAB — POCT INFLUENZA A/B
INFLUENZA A, POC: NEGATIVE
Influenza B, POC: NEGATIVE

## 2017-03-05 MED ORDER — AMOXICILLIN-POT CLAVULANATE 875-125 MG PO TABS: 1.0000 | ORAL_TABLET | Freq: Two times a day (BID) | ORAL | 0 refills | Status: AC

## 2017-03-05 NOTE — Progress Notes (Signed)
PCP: Marin Olp, MD  Subjective:  Kristin Mathews is a 47 y.o. year old very pleasant female patient who presents with sinusitis symptoms including nasal congestion, sinus tenderness, postnasal drip, cough 2 weeks ago -day of illness:2 weeks.  -Symptoms are worsening. Nasal congestion-Felt better over weekend - less fatigue but nasal congestion persisted. Then over wekeend worsened again and cough worsened, feeling some wheezing. subj fever last night. No flu contacts. Clear nasal discharge -previous treatments: mucinex  Also has stressor- closing on her house in 2 days  ROS-denies fever, SOB, NVD, tooth pain  Pertinent Past Medical History-  Patient Active Problem List   Diagnosis Date Noted  . Uncontrolled type 2 diabetes mellitus with peripheral circulatory disorder ([place]) 11/27/2012    Priority: High  . Multiple sclerosis ([place]) 07/05/2006    Priority: High  . Depression 02/25/2008    Priority: Medium  . Hyperlipidemia 08/29/2006    Priority: Medium  . Essential hypertension 07/05/2006    Priority: Medium  . Allergic rhinitis 04/27/2014    Priority: Low  . Low back pain 04/27/2014    Priority: Low  . Endometriosis 07/05/2006    Priority: Low  . Class II obesity 10/09/2016  . Nephrolithiasis 08/21/2016  . Gait disturbance 04/17/2016  . Tingling 04/17/2016  . Snoring 04/17/2016  . Urinary urgency 04/17/2016  . Excessive sleepiness 04/17/2016    Medications- reviewed  Current Outpatient Medications  Medication Sig Dispense Refill  . amLODipine (NORVASC) 10 MG tablet TAKE 1 TABLET DAILY 90 tablet 3  . blood glucose meter kit and supplies KIT Use to test blood sugar two times a day. FREESTYLE Dx:E11.9 1 each 0  . carvedilol (COREG) 6.25 MG tablet TAKE 1 TABLET TWICE A DAY WITH A MEAL 180 tablet 2  . cetirizine (ZYRTEC) 10 MG tablet Take 10 mg by mouth every evening.     . DULoxetine (CYMBALTA) 60 MG capsule TAKE 1 CAPSULE DAILY 90 capsule 1  . glipiZIDE (GLUCOTROL  XL) 10 MG 24 hr tablet TAKE 1 TABLET DAILY WITH BREAKFAST 90 tablet 1  . glucose blood (FREESTYLE TEST STRIPS) test strip TEST BLOOD SUGAR TWICE A DAY. VARY TIMES AS DIRECTED. 200 each 3  . Insulin Disposable Pump (V-GO 30) KIT Use 1x a day 30 kit 5  . insulin lispro (HUMALOG) 100 UNIT/ML injection USE WITH VGO 40 TO INJECT 76 UNITS UNDER THE SKIN ONCE AS DIRECTED 70 mL 0  . levonorgestrel-ethinyl estradiol (LYBREL,AMETHYST) 90-20 MCG tablet Take 1 tablet by mouth daily.     Marland Kitchen liraglutide (VICTOZA) 18 MG/3ML SOPN Use 1.8 mg under skin 9 pen 3  . metFORMIN (GLUCOPHAGE-XR) 500 MG 24 hr tablet TAKE 2 TABLETS (1,000 MG TOTAL) TWICE A DAY WITH A MEAL 360 tablet 1  . modafinil (PROVIGIL) 200 MG tablet Take 1 tablet (200 mg total) by mouth daily as needed. 90 tablet 1  . Naproxen Sodium 220 MG CAPS Take 440 mg by mouth 2 (two) times daily as needed (for pain.).     Marland Kitchen natalizumab (TYSABRI) 300 MG/15ML injection Inject 300 mg into the vein every 30 (thirty) days.     No current facility-administered medications for this visit.     Objective: BP (!) 142/87 (BP Location: Left Arm, Patient Position: Sitting, Cuff Size: Large)   Pulse 95   Temp 97.9 F (36.6 C) (Oral)   Ht _0  (1.702 m)   Wt 235 lb 3.2 oz (106.7 kg)   SpO2 99%   BMI 36.84 kg/m  Gen: NAD, appears fatigued, deep cough HEENT: Turbinates erythematous with yellow drainage, TM normal, pharynx mildly erythematous with no tonsilar exudate or edema, maxillary sinus tenderness CV: RRR no murmurs rubs or gallops Lungs: CTAB no crackles, wheeze, rhonchi Ext: no edema Skin: warm, dry, no rash Neuro: grossly normal, moves all extremities  Results for orders placed or performed in visit on 03/05/17 (from the past 24 hour(s))  POCT Influenza A/B     Status: None   Collection Time: 03/05/17  3:46 PM  Result Value Ref Range   Influenza A, POC Negative Negative   Influenza B, POC Negative Negative     Assessment/Plan:  Sinsusitis Bacterial based on: Symptoms >10 days, double sickening, or severe symptoms in first 3 days - patient with deep cough and 2 weeks of symptoms- also potentially concerned about bronchitis. She is on tysabri making her a high risk patient. augmentin would cover some of potential bacterial causes of bronchitis (though chance of bronchitis being bacterial likely  under 10%) - flu test negative - low to moderate pretest probability  Treatment: -considered steroid: we opted out for now -other symptomatic care with mucinex -Antibiotic indicated: yes  Finally, we reviewed reasons to return to care including if symptoms worsen or persist or new concerns arise (particularly fever or shortness of breath). Being on Tysabri- if she doesn't make signficant improvement I asked Ms. Burridge to see [place] back in 7-10 days.   Meds ordered this encounter  Medications  . amoxicillin-clavulanate (AUGMENTIN) 875-125 MG tablet    Sig: Take 1 tablet by mouth 2 (two) times daily for 7 days.    Dispense:  14 tablet    Refill:  0   Garret Reddish, MD

## 2017-03-05 NOTE — Patient Instructions (Signed)
Sinsusitis Bacterial based on: Symptoms >10 days, double sickening, or severe symptoms in first 3 days  Treatment: -considered steroid: we opted out for now -other symptomatic care with mucinex -Antibiotic indicated: yes  Finally, we reviewed reasons to return to care including if symptoms worsen or persist or new concerns arise (particularly fever or shortness of breath). Being on Tysabri- if she doesn't make signficant improvement I asked Kristin Mathews to see [place] back in 7-10 days.   Meds ordered this encounter  Medications  . amoxicillin-clavulanate (AUGMENTIN) 875-125 MG tablet    Sig: Take 1 tablet by mouth 2 (two) times daily for 7 days.    Dispense:  14 tablet    Refill:  0

## 2017-03-09 ENCOUNTER — Other Ambulatory Visit: Payer: Self-pay | Admitting: Internal Medicine

## 2017-03-13 DIAGNOSIS — G35 Multiple sclerosis: Secondary | ICD-10-CM | POA: Diagnosis not present

## 2017-03-21 ENCOUNTER — Ambulatory Visit: Payer: BLUE CROSS/BLUE SHIELD | Admitting: Family Medicine

## 2017-03-21 ENCOUNTER — Ambulatory Visit (INDEPENDENT_AMBULATORY_CARE_PROVIDER_SITE_OTHER): Payer: BLUE CROSS/BLUE SHIELD

## 2017-03-21 ENCOUNTER — Ambulatory Visit: Payer: BLUE CROSS/BLUE SHIELD | Admitting: Physician Assistant

## 2017-03-21 ENCOUNTER — Encounter: Payer: Self-pay | Admitting: Physician Assistant

## 2017-03-21 VITALS — BP 126/76 | HR 66 | Temp 97.3°F | Ht 66.0 in | Wt 232.0 lb

## 2017-03-21 DIAGNOSIS — R05 Cough: Secondary | ICD-10-CM

## 2017-03-21 DIAGNOSIS — G35 Multiple sclerosis: Secondary | ICD-10-CM

## 2017-03-21 DIAGNOSIS — IMO0002 Reserved for concepts with insufficient information to code with codable children: Secondary | ICD-10-CM

## 2017-03-21 DIAGNOSIS — E1151 Type 2 diabetes mellitus with diabetic peripheral angiopathy without gangrene: Secondary | ICD-10-CM | POA: Diagnosis not present

## 2017-03-21 DIAGNOSIS — J40 Bronchitis, not specified as acute or chronic: Secondary | ICD-10-CM | POA: Diagnosis not present

## 2017-03-21 DIAGNOSIS — E1165 Type 2 diabetes mellitus with hyperglycemia: Secondary | ICD-10-CM

## 2017-03-21 DIAGNOSIS — R059 Cough, unspecified: Secondary | ICD-10-CM

## 2017-03-21 DIAGNOSIS — G35D Multiple sclerosis, unspecified: Secondary | ICD-10-CM

## 2017-03-21 MED ORDER — BENZONATATE 100 MG PO CAPS: 100.0000 mg | ORAL_CAPSULE | Freq: Two times a day (BID) | ORAL | 0 refills | Status: DC | PRN

## 2017-03-21 MED ORDER — GUAIFENESIN-CODEINE 100-10 MG/5ML PO SYRP: 5.0000 mL | ORAL_SOLUTION | Freq: Three times a day (TID) | ORAL | 0 refills | Status: DC | PRN

## 2017-03-21 MED ORDER — ALBUTEROL SULFATE HFA 108 (90 BASE) MCG/ACT IN AERS: 2.0000 | INHALATION_SPRAY | Freq: Four times a day (QID) | RESPIRATORY_TRACT | 2 refills | Status: DC | PRN

## 2017-03-21 NOTE — Progress Notes (Signed)
Kristin Mathews is a 47 y.o. female here for recurrent problem.  I acted as a Education administrator for [employer], PA-C Anselmo Pickler, LPN  History of Present Illness:   Chief Complaint  Patient presents with  . Cough    Chronic cough x 1 month    Cough  This is a new problem. Episode onset: Pt c/o cough x 1 month. The problem has been waxing and waning. The problem occurs every few hours. The cough is productive of sputum (Clear). Associated symptoms include chills, headaches, postnasal drip and wheezing. Pertinent negatives include no chest pain, ear congestion, ear pain, fever, heartburn, hemoptysis, myalgias, nasal congestion, rash, rhinorrhea, sore throat, shortness of breath, sweats or weight loss. Associated symptoms comments: Chest congestion. Nothing aggravates the symptoms. She has tried OTC cough suppressant (Black Elderberry) for the symptoms. The treatment provided no relief. Her past medical history is significant for bronchitis and environmental allergies. There is no history of asthma, COPD, emphysema or pneumonia.   She was seen by her PCP, Dr. Yong Channel, on 03/05/17 for this. Was tested for flu which was negative. She also started Augmentin. She tells me that she has tried every single cough syrup that she could to try to help her cough but nothing has worked.  She does have hx of MS -- on Tysabri presently.  She is also diabetic, most recent HgbA1c around 9. She cannot tell me what her blood sugars have been running, states that she hasn't checked them recently.   Past Medical History:  Diagnosis Date  . Arthritis    mild  . Depression   . Diabetes mellitus    Type 2  . Endometriosis   . History of kidney stones   . Hypertension   . Multiple sclerosis ([place])   . Type II or unspecified type diabetes mellitus with peripheral circulatory disorders, uncontrolled(250.72) 11/27/2012     Social History   Socioeconomic History  . Marital status: Single    Spouse name: Not on  file  . Number of children: Not on file  . Years of education: Not on file  . Highest education level: Not on file  Social Needs  . Financial resource strain: Not on file  . Food insecurity - worry: Not on file  . Food insecurity - inability: Not on file  . Transportation needs - medical: Not on file  . Transportation needs - non-medical: Not on file  Occupational History  . Not on file  Tobacco Use  . Smoking status: Never Smoker  . Smokeless tobacco: Never Used  Substance and Sexual Activity  . Alcohol use: Yes    Alcohol/week: 0.0 oz    Comment: occasional  . Drug use: No  . Sexual activity: Not on file  Other Topics Concern  . Not on file  Social History Narrative   Single. Lives alone. No children. Pet cat.       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 EXPLORATORY LAPAROTOMY     endometriosis  . EXTRACORPOREAL SHOCK WAVE LITHOTRIPSY Right 11/19/2016   Procedure: RIGHT EXTRACORPOREAL SHOCK WAVE LITHOTRIPSY (ESWL);  Surgeon: Festus Aloe, MD;  Location: [facility];  Service: Urology;  Laterality: Right;  . LUMBAR DISC SURGERY     LS spine 2005 or so  .  WISDOM TOOTH EXTRACTION      Family History  Problem Relation Age of Onset  . Breast cancer Mother        early 42s  . Diabetes Mother        maternal grandmother as well  . Diabetes Father   . Other Father        progressive supranuclear palsy    Allergies  Allergen Reactions  . Ace Inhibitors Swelling    Edema    Current Medications:   Current Outpatient Medications:  .  amLODipine (NORVASC) 10 MG tablet, TAKE 1 TABLET DAILY, Disp: 90 tablet, Rfl: 3 .  blood glucose meter kit and supplies KIT, Use to test blood sugar two times a day. FREESTYLE Dx:E11.9, Disp: 1 each, Rfl: 0 .  carvedilol (COREG) 6.25 MG tablet, TAKE 1  TABLET TWICE A DAY WITH A MEAL, Disp: 180 tablet, Rfl: 2 .  cetirizine (ZYRTEC) 10 MG tablet, Take 10 mg by mouth every evening. , Disp: , Rfl:  .  DULoxetine (CYMBALTA) 60 MG capsule, TAKE 1 CAPSULE DAILY, Disp: 90 capsule, Rfl: 1 .  fluticasone (FLONASE) 50 MCG/ACT nasal spray, Place 2 sprays into both nostrils daily., Disp: , Rfl:  .  glipiZIDE (GLUCOTROL XL) 10 MG 24 hr tablet, TAKE 1 TABLET DAILY WITH BREAKFAST, Disp: 90 tablet, Rfl: 1 .  glucose blood (FREESTYLE TEST STRIPS) test strip, TEST BLOOD SUGAR TWICE A DAY. VARY TIMES AS DIRECTED., Disp: 200 each, Rfl: 3 .  Insulin Disposable Pump (V-GO 30) KIT, Use 1x a day, Disp: 30 kit, Rfl: 5 .  insulin lispro (HUMALOG) 100 UNIT/ML injection, USE WITH VGO 40 TO INJECT 76 UNITS UNDER THE SKIN ONCE AS DIRECTED, Disp: 70 mL, Rfl: 0 .  levonorgestrel-ethinyl estradiol (LYBREL,AMETHYST) 90-20 MCG tablet, Take 1 tablet by mouth daily. , Disp: , Rfl:  .  liraglutide (VICTOZA) 18 MG/3ML SOPN, Use 1.8 mg under skin, Disp: 9 pen, Rfl: 3 .  metFORMIN (GLUCOPHAGE-XR) 500 MG 24 hr tablet, TAKE 2 TABLETS (1,000 MG TOTAL) TWICE A DAY WITH A MEAL, Disp: 360 tablet, Rfl: 1 .  modafinil (PROVIGIL) 200 MG tablet, Take 1 tablet (200 mg total) by mouth daily as needed., Disp: 90 tablet, Rfl: 1 .  Naproxen Sodium 220 MG CAPS, Take 440 mg by mouth 2 (two) times daily as needed (for pain.). , Disp: , Rfl:  .  natalizumab (TYSABRI) 300 MG/15ML injection, Inject 300 mg into the vein every 30 (thirty) days., Disp: , Rfl:  .  albuterol (PROVENTIL HFA;VENTOLIN HFA) 108 (90 Base) MCG/ACT inhaler, Inhale 2 puffs into the lungs every 6 (six) hours as needed for wheezing or shortness of breath., Disp: 1 Inhaler, Rfl: 2 .  benzonatate (TESSALON) 100 MG capsule, Take 1 capsule (100 mg total) by mouth 2 (two) times daily as needed for cough., Disp: 20 capsule, Rfl: 0 .  guaiFENesin-codeine (ROBITUSSIN AC) 100-10 MG/5ML syrup, Take 5 mLs by mouth 3 (three) times daily as needed for  cough., Disp: 120 mL, Rfl: 0   Review of Systems:   Review of Systems  Constitutional: Positive for chills. Negative for fever and weight loss.  HENT: Positive for postnasal drip. Negative for ear pain, rhinorrhea and sore throat.   Respiratory: Positive for cough and wheezing. Negative for hemoptysis and shortness of breath.   Cardiovascular: Negative for chest pain.  Gastrointestinal: Negative for heartburn.  Musculoskeletal: Negative for myalgias.  Skin: Negative for rash.  Neurological: Positive for headaches.  Endo/Heme/Allergies: Positive for environmental allergies.  Vitals:   Vitals:   03/21/17 1058  BP: 126/76  Pulse: 66  Temp: (!) 97.3 F (36.3 C)  TempSrc: Oral  SpO2: 99%  Weight: 232 lb (105.2 kg)  Height: _0  (1.676 m)     Body mass index is 37.45 kg/m.  Physical Exam:   Physical Exam  Constitutional: She appears well-developed. She is cooperative.  Non-toxic appearance. She does not have a sickly appearance. She does not appear ill. No distress.  HENT:  Head: Normocephalic and atraumatic.  Right Ear: Tympanic membrane, external ear and ear canal normal. Tympanic membrane is not erythematous, not retracted and not bulging.  Left Ear: Tympanic membrane, external ear and ear canal normal. Tympanic membrane is not erythematous, not retracted and not bulging.  Nose: Mucosal edema and rhinorrhea present. Right sinus exhibits no maxillary sinus tenderness and no frontal sinus tenderness. Left sinus exhibits no maxillary sinus tenderness and no frontal sinus tenderness.  Mouth/Throat: Uvula is midline and mucous membranes are normal. Posterior oropharyngeal erythema present. No posterior oropharyngeal edema. Tonsils are 0 on the right. Tonsils are 0 on the left.  Eyes: Conjunctivae and lids are normal.  Neck: Trachea normal.  Cardiovascular: Normal rate, regular rhythm, S1 normal, S2 normal and normal heart sounds.  Pulmonary/Chest: Effort normal. She has  decreased breath sounds in the right lower field and the left lower field. She has no wheezes. She has no rhonchi. She has no rales.  Lymphadenopathy:    She has no cervical adenopathy.  Neurological: She is alert.  Skin: Skin is warm, dry and intact.  Psychiatric: She has a normal mood and affect. Her speech is normal and behavior is normal.  Nursing note and vitals reviewed.  CXR: no acute abnormality   Assessment and Plan:    Oris was seen today for cough.  Diagnoses and all orders for this visit:  Bronchitis No red flags on exam.  Will initiate Cheratussin, Tessalon perles and albuterol inhaler per orders. Discussed taking medications as prescribed. Reviewed return precautions including worsening fever, SOB, worsening cough or other concerns. Push fluids and rest. I recommend that patient follow-up if symptoms worsen or persist despite treatment x 7-10 days, sooner if needed. -     DG Chest 2 View; Future  Uncontrolled type 2 diabetes mellitus with peripheral circulatory disorder ([place]) We briefly discussed steroids for treatment but decided to hold off for now. Encouraged patient to check her blood sugars more frequently.  Multiple sclerosis ([facility]) Low threshold to complete second round of antibiotics if needed, consider Levaquin. Discussed return to clinic precautions. Patient verbalized understanding.  Other orders -     benzonatate (TESSALON) 100 MG capsule; Take 1 capsule (100 mg total) by mouth 2 (two) times daily as needed for cough. -     albuterol (PROVENTIL HFA;VENTOLIN HFA) 108 (90 Base) MCG/ACT inhaler; Inhale 2 puffs into the lungs every 6 (six) hours as needed for wheezing or shortness of breath. -     guaiFENesin-codeine (ROBITUSSIN AC) 100-10 MG/5ML syrup; Take 5 mLs by mouth 3 (three) times daily as needed for cough.    Levaquin Tessalon Perles Albuterol CT with  . Reviewed expectations re: course of current medical issues. . Discussed self-management of  symptoms. . Outlined signs and symptoms indicating need for more acute intervention. . Patient verbalized understanding and all questions were answered. . See orders for this visit as documented in the electronic medical record. . Patient received an After-Visit Summary.  CMA or LPN served as  scribe during this visit. History, Physical, and Plan performed by medical provider. Documentation and orders reviewed and attested to.  Inda Coke, PA-C

## 2017-03-21 NOTE — Patient Instructions (Addendum)
It was great to meet you!  Start the inhaler, may use every 4 to 6 hours for cough. If cough syrup is too sedating, stop use. Do not use while driving.  I will contact you about your xray results.  Push fluids and get plenty of rest. Please return if you are not improving as expected, or if you have high fevers (>101.5) or difficulty swallowing or worsening productive cough.  Call clinic with questions.  I hope you start feeling better soon!

## 2017-03-22 ENCOUNTER — Telehealth: Payer: Self-pay | Admitting: Family Medicine

## 2017-03-22 NOTE — Telephone Encounter (Signed)
Copied from [place] [id number]. Topic: Quick Communication - Other Results >> Mar 22, 2017  9:49 AM Corie Chiquito, NT wrote: Patient is calling for her X-ray results. If someone could give her a call back about this at [phone]

## 2017-03-22 NOTE — Telephone Encounter (Signed)
Called and left a voicemail message asking for a return phone call. CRM created

## 2017-03-22 NOTE — Telephone Encounter (Signed)
See note

## 2017-04-11 DIAGNOSIS — G35 Multiple sclerosis: Secondary | ICD-10-CM | POA: Diagnosis not present

## 2017-04-15 ENCOUNTER — Encounter: Payer: Self-pay | Admitting: *Deleted

## 2017-04-30 ENCOUNTER — Other Ambulatory Visit: Payer: Self-pay | Admitting: Family Medicine

## 2017-05-04 ENCOUNTER — Other Ambulatory Visit: Payer: Self-pay | Admitting: Internal Medicine

## 2017-05-13 DIAGNOSIS — G35 Multiple sclerosis: Secondary | ICD-10-CM | POA: Diagnosis not present

## 2017-05-29 ENCOUNTER — Encounter (HOSPITAL_COMMUNITY): Payer: Self-pay | Admitting: *Deleted

## 2017-05-29 ENCOUNTER — Other Ambulatory Visit: Payer: Self-pay

## 2017-05-29 ENCOUNTER — Emergency Department (HOSPITAL_COMMUNITY)
Admission: EM | Admit: 2017-05-29 | Discharge: 2017-05-30 | Disposition: A | Discharge status: Home / Self Care | Payer: BLUE CROSS/BLUE SHIELD | Attending: Emergency Medicine | Admitting: Emergency Medicine

## 2017-05-29 ENCOUNTER — Emergency Department (HOSPITAL_COMMUNITY): Payer: BLUE CROSS/BLUE SHIELD

## 2017-05-29 DIAGNOSIS — Z7984 Long term (current) use of oral hypoglycemic drugs: Secondary | ICD-10-CM | POA: Diagnosis not present

## 2017-05-29 DIAGNOSIS — M62830 Muscle spasm of back: Secondary | ICD-10-CM | POA: Diagnosis not present

## 2017-05-29 DIAGNOSIS — R911 Solitary pulmonary nodule: Secondary | ICD-10-CM | POA: Diagnosis not present

## 2017-05-29 DIAGNOSIS — R072 Precordial pain: Secondary | ICD-10-CM | POA: Diagnosis not present

## 2017-05-29 DIAGNOSIS — Z79899 Other long term (current) drug therapy: Secondary | ICD-10-CM | POA: Diagnosis not present

## 2017-05-29 DIAGNOSIS — I10 Essential (primary) hypertension: Secondary | ICD-10-CM | POA: Insufficient documentation

## 2017-05-29 DIAGNOSIS — E119 Type 2 diabetes mellitus without complications: Secondary | ICD-10-CM | POA: Diagnosis not present

## 2017-05-29 DIAGNOSIS — M62838 Other muscle spasm: Secondary | ICD-10-CM

## 2017-05-29 DIAGNOSIS — I712 Thoracic aortic aneurysm, without rupture, unspecified: Secondary | ICD-10-CM

## 2017-05-29 DIAGNOSIS — R079 Chest pain, unspecified: Secondary | ICD-10-CM | POA: Diagnosis not present

## 2017-05-29 DIAGNOSIS — R0789 Other chest pain: Secondary | ICD-10-CM | POA: Diagnosis not present

## 2017-05-29 LAB — CBC WITH DIFFERENTIAL/PLATELET
BASOS PCT: 1 %
BLASTS: 0 %
Band Neutrophils: 0 %
Basophils Absolute: 0.1 10*3/uL (ref 0.0–0.1)
EOS ABS: 0.8 10*3/uL — AB (ref 0.0–0.7)
Eosinophils Relative: 6 %
HCT: 43 % (ref 36.0–46.0)
HEMOGLOBIN: 14.1 g/dL (ref 12.0–15.0)
LYMPHS PCT: 44 %
Lymphs Abs: 5.6 10*3/uL — ABNORMAL HIGH (ref 0.7–4.0)
MCH: 27.6 pg (ref 26.0–34.0)
MCHC: 32.8 g/dL (ref 30.0–36.0)
MCV: 84.3 fL (ref 78.0–100.0)
MONO ABS: 0.6 10*3/uL (ref 0.1–1.0)
MYELOCYTES: 0 %
Metamyelocytes Relative: 0 %
Monocytes Relative: 5 %
NEUTROS PCT: 44 %
NRBC: 1 /100{WBCs} — AB
Neutro Abs: 5.7 10*3/uL (ref 1.7–7.7)
Other: 0 %
PLATELETS: 383 10*3/uL (ref 150–400)
PROMYELOCYTES RELATIVE: 0 %
RBC: 5.1 MIL/uL (ref 3.87–5.11)
RDW: 14 % (ref 11.5–15.5)
WBC: 12.8 10*3/uL — ABNORMAL HIGH (ref 4.0–10.5)

## 2017-05-29 LAB — BASIC METABOLIC PANEL
ANION GAP: 11 (ref 5–15)
BUN: 13 mg/dL (ref 6–20)
CHLORIDE: 103 mmol/L (ref 101–111)
CO2: 26 mmol/L (ref 22–32)
CREATININE: 0.83 mg/dL (ref 0.44–1.00)
Calcium: 9 mg/dL (ref 8.9–10.3)
GFR calc non Af Amer: >60 mL/min (ref >60)
Glucose, Bld: 142 mg/dL — ABNORMAL HIGH (ref 65–99)
POTASSIUM: 3.7 mmol/L (ref 3.5–5.1)
SODIUM: 140 mmol/L (ref 135–145)

## 2017-05-29 LAB — D-DIMER, QUANTITATIVE (NOT AT ARMC): D DIMER QUANT: 0.33 ug{FEU}/mL (ref 0.00–0.50)

## 2017-05-29 LAB — I-STAT TROPONIN, ED: TROPONIN I, POC: 0.01 ng/mL (ref 0.00–0.08)

## 2017-05-29 LAB — I-STAT BETA HCG BLOOD, ED (MC, WL, AP ONLY)

## 2017-05-29 NOTE — ED Provider Notes (Signed)
Patient placed in Quick Look pathway, seen and evaluated   Chief Complaint: CP  HPI:   Pt is a 47 y.o. female with a PMHx of DM2, HTN, MS, depression, and kidney stones, presenting today with c/o left-sided chest pain that began at 3 PM when she was laying down for a nap, and worsened around 6 PM when she woke up from her nap.  She states that the pain starts in the left side of her chest and radiates to the left neck and shoulder, worsens with inspiration.  She is on OCPs which contain estrogen.  She denies any lightheadedness, diaphoresis, shortness of breath, cough, recent travel/surgery/immobilization, personal or family history of DVT/PE, leg swelling, nausea, vomiting, numbness, tingling, focal weakness, or any other complaints at this time.  ROS: +CP. No lightheadedness, diaphoresis, shortness of breath, cough, recent travel/surgery/immobilization, personal or family history of DVT/PE, leg swelling, nausea, vomiting, numbness, tingling, or focal weakness  Physical Exam:  BP 140/89 (BP Location: Right Arm)   Pulse 81   Temp 98.6 F (37 C) (Oral)   Resp 16   Ht 5\' 6"  (1.676 m)   Wt 104.3 kg (230 lb)   SpO2 99%   BMI 37.12 kg/m    Gen: No distress  Neuro: Awake and Alert  Skin: Warm    Focused Exam: Heart: RRR, nl s1/s2, no m/r/g, distal pulses intact, no pedal edema. Pulm: CTAB in all lung fields, no w/r/r, no hypoxia or increased WOB, speaking in full sentences, SpO2 99% on RA    Initiation of care has begun. The patient has been counseled on the process, plan, and necessity for staying for the completion/evaluation, and the remainder of the medical screening examination     [address] [place], [place] 05/29/17 1956    Quintella Reichert, MD 05/30/17 1414

## 2017-05-29 NOTE — ED Triage Notes (Signed)
Pt ARRIVED BY [facility] FROM [facility]  SHE WENT HOME EXPERIENCED CHEST PAIN APPROX 3-4 HOUORS AGO    PAIN WORSE WITH INSPIRATION.  NO RECENT COLD OR COUGH.  LMP NONE  No previous history

## 2017-05-30 ENCOUNTER — Encounter (HOSPITAL_COMMUNITY): Payer: Self-pay | Admitting: Radiology

## 2017-05-30 ENCOUNTER — Emergency Department (HOSPITAL_COMMUNITY): Payer: BLUE CROSS/BLUE SHIELD

## 2017-05-30 DIAGNOSIS — R0789 Other chest pain: Secondary | ICD-10-CM | POA: Diagnosis not present

## 2017-05-30 LAB — I-STAT TROPONIN, ED: TROPONIN I, POC: 0 ng/mL (ref 0.00–0.08)

## 2017-05-30 MED ORDER — METHOCARBAMOL 500 MG PO TABS: 500.0000 mg | ORAL_TABLET | Freq: Two times a day (BID) | ORAL | 0 refills | Status: DC

## 2017-05-30 MED ORDER — IOPAMIDOL (ISOVUE-370) INJECTION 76%
100.0000 mL | Freq: Once | INTRAVENOUS | Status: AC | PRN
  Administered 2017-05-30: 100 mL via INTRAVENOUS

## 2017-05-30 MED ORDER — DICLOFENAC SODIUM ER 100 MG PO TB24: 100.0000 mg | ORAL_TABLET | Freq: Every day | ORAL | 0 refills | Status: DC

## 2017-05-30 NOTE — ED Provider Notes (Signed)
[facility] EMERGENCY DEPARTMENT Provider Note   CSN: [id number] Arrival date & time: 05/29/17  1929     History   Chief Complaint Chief Complaint  Patient presents with  . Chest Pain    HPI Kristin Mathews is a 47 y.o. female.  The history is provided by the patient.  Chest Pain   This is a new problem. The current episode started 12 to 24 hours ago. The problem occurs constantly. The problem has not changed since onset.The pain is associated with movement. The pain is present in the lateral region. The pain is moderate. The quality of the pain is described as dull. The pain radiates to the left shoulder. Pertinent negatives include no abdominal pain, no exertional chest pressure, no fever, no palpitations, no shortness of breath, no sputum production, no syncope, no vomiting and no weakness. She has tried nothing for the symptoms. The treatment provided no relief. Risk factors include obesity.  Pertinent negatives for past medical history include no aneurysm.  Pertinent negatives for family medical history include: no Marfan's syndrome.  Procedure history is negative for cardiac catheterization.    Past Medical History:  Diagnosis Date  . Arthritis    mild  . Depression   . Diabetes mellitus    Type 2  . Endometriosis   . History of kidney stones   . Hypertension   . Multiple sclerosis ([place])   . Type II or unspecified type diabetes mellitus with peripheral circulatory disorders, uncontrolled(250.72) 11/27/2012    Patient Active Problem List   Diagnosis Date Noted  . Immunosuppressed status ([place]) 03/05/2017  . Class II obesity 10/09/2016  . Nephrolithiasis 08/21/2016  . Gait disturbance 04/17/2016  . Tingling 04/17/2016  . Snoring 04/17/2016  . Urinary urgency 04/17/2016  . Excessive sleepiness 04/17/2016  . Allergic rhinitis 04/27/2014  . Low back pain 04/27/2014  . Uncontrolled type 2 diabetes mellitus with peripheral circulatory disorder ([place])  11/27/2012  . Depression 02/25/2008  . Hyperlipidemia 08/29/2006  . Multiple sclerosis ([place]) 07/05/2006  . Essential hypertension 07/05/2006  . Endometriosis 07/05/2006    Past Surgical History:  Procedure Laterality Date  . EXPLORATORY LAPAROTOMY     endometriosis  . EXTRACORPOREAL SHOCK WAVE LITHOTRIPSY Right 11/19/2016   Procedure: RIGHT EXTRACORPOREAL SHOCK WAVE LITHOTRIPSY (ESWL);  Surgeon: Festus Aloe, MD;  Location: [facility];  Service: Urology;  Laterality: Right;  . LUMBAR DISC SURGERY     LS spine 2005 or so  . WISDOM TOOTH EXTRACTION       OB History   None      Home Medications    Prior to Admission medications   Medication Sig Start Date End Date Taking? Authorizing Provider  amLODipine (NORVASC) 10 MG tablet TAKE 1 TABLET DAILY 04/30/17  Yes Marin Olp, MD  carvedilol (COREG) 6.25 MG tablet TAKE 1 TABLET TWICE A DAY WITH A MEAL Patient taking differently: Take 6.25 mg by mouth every evening.  09/26/15  Yes [name], MD  cetirizine (ZYRTEC) 10 MG tablet Take 10 mg by mouth every evening.    Yes [provider]  DULoxetine (CYMBALTA) 60 MG capsule TAKE 1 CAPSULE DAILY 02/18/17  Yes Marin Olp, MD  fluticasone [facility]) 50 MCG/ACT nasal spray Place 2 sprays into both nostrils daily.   Yes [provider]  glipiZIDE (GLUCOTROL XL) 10 MG 24 hr tablet TAKE 1 TABLET DAILY WITH BREAKFAST 05/06/17  Yes [name], MD  Insulin Disposable Pump (V-GO 30) KIT Use 1x  a day Patient taking differently: continuous.  10/09/16  Yes [name], MD  levonorgestrel-ethinyl estradiol (LYBREL,AMETHYST) 90-20 MCG tablet Take 1 tablet by mouth daily.    Yes [provider]  liraglutide (VICTOZA) 18 MG/3ML SOPN Use 1.8 mg under skin Patient taking differently: Inject 1.5 mg into the skin daily.  01/28/17  Yes [name], MD  metFORMIN (GLUCOPHAGE-XR) 500 MG 24 hr tablet TAKE 2 TABLETS (1,000 MG TOTAL) TWICE A DAY WITH A  MEAL 03/11/17  Yes [name], MD  modafinil (PROVIGIL) 200 MG tablet Take 1 tablet (200 mg total) by mouth daily as needed. 04/10/16  Yes Sater, Nanine Means, MD  Naproxen Sodium 220 MG CAPS Take 440 mg by mouth 2 (two) times daily as needed (for pain.).    Yes [provider]  natalizumab (TYSABRI) 300 MG/15ML injection Inject 300 mg into the vein every 30 (thirty) days.   Yes [provider]  albuterol (PROVENTIL HFA;VENTOLIN HFA) 108 (90 Base) MCG/ACT inhaler Inhale 2 puffs into the lungs every 6 (six) hours as needed for wheezing or shortness of breath. Patient not taking: Reported on 05/30/2017 03/21/17   Inda Coke, PA  benzonatate (TESSALON) 100 MG capsule Take 1 capsule (100 mg total) by mouth 2 (two) times daily as needed for cough. Patient not taking: Reported on 05/30/2017 03/21/17   Inda Coke, PA  blood glucose meter kit and supplies KIT Use to test blood sugar two times a day. FREESTYLE Dx:E11.9 08/21/16   Marin Olp, MD  glucose blood (FREESTYLE TEST STRIPS) test strip TEST BLOOD SUGAR TWICE A DAY. VARY TIMES AS DIRECTED. 03/14/16   [name], MD  guaiFENesin-codeine [facility]) 100-10 MG/5ML syrup Take 5 mLs by mouth 3 (three) times daily as needed for cough. Patient not taking: Reported on 05/30/2017 03/21/17   Inda Coke, PA  insulin lispro (HUMALOG) 100 UNIT/ML injection USE WITH VGO 40 TO INJECT 76 UNITS UNDER THE SKIN ONCE AS DIRECTED Patient not taking: Reported on 05/30/2017 01/22/17   [name], MD  amLODipine-benazepril (LOTREL) 10-20 MG per capsule Take 1 capsule by mouth daily. 07/14/12 12/17/12  Swords, Darrick Penna, MD    Family History Family History  Problem Relation Age of Onset  . Breast cancer Mother        early 4s  . Diabetes Mother        maternal grandmother as well  . Diabetes Father   . Other Father        progressive supranuclear palsy    Social History Social History   Tobacco Use  . Smoking  status: Never Smoker  . Smokeless tobacco: Never Used  Substance Use Topics  . Alcohol use: Yes    Alcohol/week: 0.0 oz    Comment: occasional  . Drug use: No     Allergies   Ace inhibitors   Review of Systems Review of Systems  Constitutional: Negative for fever.  Respiratory: Negative for sputum production, chest tightness and shortness of breath.   Cardiovascular: Positive for chest pain. Negative for palpitations, leg swelling and syncope.  Gastrointestinal: Negative for abdominal pain and vomiting.  Neurological: Negative for weakness.  All other systems reviewed and are negative.    Physical Exam Updated Vital Signs BP 133/74   Pulse 83   Temp 98.6 F (37 C) (Oral)   Resp 17   Ht '5\' 6"'  (1.676 m)   Wt 104.3 kg (230 lb)   SpO2 97%   BMI 37.12 kg/m   Physical  Exam  Constitutional: She is oriented to person, place, and time. She appears well-developed and well-nourished. No distress.  HENT:  Head: Normocephalic and atraumatic.  Mouth/Throat: No oropharyngeal exudate.  Eyes: Pupils are equal, round, and reactive to light. Conjunctivae are normal.  Neck: Normal range of motion. Neck supple.  Cardiovascular: Normal rate, regular rhythm, normal heart sounds and intact distal pulses.  Pulmonary/Chest: Effort normal and breath sounds normal. No stridor. She has no wheezes. She has no rales. She exhibits tenderness.  Spasm  Abdominal: Soft. Bowel sounds are normal. She exhibits no mass. There is no tenderness. There is no rebound and no guarding.  Musculoskeletal: Normal range of motion.  Neurological: She is alert and oriented to person, place, and time. She displays normal reflexes.  Skin: Skin is warm and dry. Capillary refill takes less than 2 seconds.  Psychiatric: She has a normal mood and affect.     ED Treatments / Results  Labs (all labs ordered are listed, but only abnormal results are displayed) Labs Reviewed  BASIC METABOLIC PANEL - Abnormal; Notable  for the following components:      Result Value   Glucose, Bld 142 (*)    All other components within normal limits  CBC WITH DIFFERENTIAL/PLATELET - Abnormal; Notable for the following components:   WBC 12.8 (*)    nRBC 1 (*)    Lymphs Abs 5.6 (*)    Eosinophils Absolute 0.8 (*)    All other components within normal limits  D-DIMER, QUANTITATIVE (NOT AT [facility])  I-STAT TROPONIN, ED  I-STAT BETA HCG BLOOD, ED ([facility], [facility], [facility] ONLY)  I-STAT TROPONIN, ED    EKG EKG Interpretation  Date/Time:  Wednesday May 29 2017 19:40:17 EDT Ventricular Rate:  85 PR Interval:  150 QRS Duration: 80 QT Interval:  368 QTC Calculation: 437 R Axis:   -34 Text Interpretation:  Normal sinus rhythm Left axis deviation Moderate voltage criteria for LVH, may be normal variant Confirmed by Randal Buba, Haleem Hanner ([id number]) on 05/30/2017 2:44:36 AM   Radiology Dg Chest 2 View  Result Date: 05/29/2017 CLINICAL DATA:  Chest pain today, worsening. EXAM: CHEST - 2 VIEW COMPARISON:  Chest x-rays dated 03/21/2017 and 11/12/2015. FINDINGS: New linear opacities at the LEFT lung base, most likely atelectasis. Questionable small pulmonary nodule within the LEFT upper lobe. No pleural effusion or pneumothorax seen. Heart size and mediastinal contours are stable. No acute or suspicious osseous finding. IMPRESSION: 1. Questionable small pulmonary nodule within the LEFT upper lobe. Recommend nonemergent noncontrast chest CT for further characterization. 2. Linear opacities at the LEFT lung base, presumably mild atelectasis. 3. No evidence of pneumonia or pulmonary edema. Electronically Signed   By: Franki Cabot M.D.   On: 05/29/2017 20:59   Ct Angio Chest Pe W And/or Wo Contrast  Result Date: 05/30/2017 CLINICAL DATA:  Acute onset of left-sided chest pain. EXAM: CT ANGIOGRAPHY CHEST WITH CONTRAST TECHNIQUE: Multidetector CT imaging of the chest was performed using the standard protocol during bolus administration of intravenous contrast.  Multiplanar CT image reconstructions and MIPs were obtained to evaluate the vascular anatomy. CONTRAST:  134m ISOVUE-370 IOPAMIDOL (ISOVUE-370) INJECTION 76% COMPARISON:  Chest radiograph performed 05/29/2017 FINDINGS: Cardiovascular:  There is no evidence of pulmonary embolus. The heart is borderline normal in size. There is mild aneurysmal dilatation of the ascending thoracic aorta to 4.2 cm in AP dimension. The great vessels are grossly unremarkable in appearance. Mediastinum/Nodes: The mediastinum is otherwise unremarkable. No mediastinal lymphadenopathy is seen. No pericardial effusion is  identified. The visualized portions of the thyroid gland are unremarkable. No axillary lymphadenopathy is appreciated. Lungs/Pleura: Mild bilateral dependent subsegmental atelectasis is noted. A 5 mm nodule is noted at the superior aspect of the right lower lobe (image 54 of 124). No pleural effusion or pneumothorax is seen. Upper Abdomen: The visualized portions of the liver and spleen are unremarkable. Musculoskeletal: No acute osseous abnormalities are identified. The visualized musculature is unremarkable in appearance. Review of the MIP images confirms the above findings. IMPRESSION: 1. No evidence of pulmonary embolus. 2. Mild bilateral dependent subsegmental atelectasis. 3. 5 mm nodule at the superior aspect of the right lower lobe. No follow-up needed if patient is low-risk. Non-contrast chest CT can be considered in 12 months if patient is high-risk. This recommendation follows the consensus statement: Guidelines for Management of Incidental Pulmonary Nodules Detected on CT Images: From the Fleischner Society 2017; Radiology 2017; 284:228-243. 4. Mild aneurysmal dilatation of the ascending thoracic aorta to 4.2 cm in AP dimension. Recommend annual imaging followup by CTA or MRA. This recommendation follows 2010 ACCF/AHA/AATS/ACR/ASA/SCA/SCAI/SIR/STS/SVM Guidelines for the Diagnosis and Management of Patients with  Thoracic Aortic Disease. Circulation. 2010; 121: Y641-R830 Electronically Signed   By: Garald Balding M.D.   On: 05/30/2017 04:30    Procedures Procedures (including critical care time)  Medications Ordered in ED Medications  iopamidol (ISOVUE-370) 76 % injection 100 mL (100 mLs Intravenous Contrast Given 05/30/17 0357)   Ruled out for MI and PE in the ED.   Final Clinical Impressions(s) / ED Diagnoses   Final diagnoses:  Precordial chest pain   Informed of thoracic aneurysm and need for close follow up with thoracic surgery as well as pulmonary nodule required follow up with your PMD for outpatient CT scan to assess stability.  All this information was also printed on your discharge paperwork.  Will treat for muscle spasm   Return for weakness, numbness, changes in vision or speech, fevers >100.4 unrelieved by medication, shortness of breath, intractable vomiting, or diarrhea, abdominal pain, Inability to tolerate liquids or food, cough, altered mental status or any concerns. No signs of systemic illness or infection. The patient is nontoxic-appearing on exam and vital signs are within normal limits.   I have reviewed the triage vital signs and the nursing notes. Pertinent labs &imaging results that were available during my care of the patient were reviewed by me and considered in my medical decision making (see chart for details).  After history, exam, and medical workup I feel the patient has been appropriately medically screened and is safe for discharge home. Pertinent diagnoses were discussed with the patient. Patient was given return precautions.   Hawthorne Day, MD 05/30/17 [phone]

## 2017-05-30 NOTE — ED Notes (Signed)
Pt. To CT via stretcher. 

## 2017-06-01 ENCOUNTER — Other Ambulatory Visit: Payer: Self-pay | Admitting: Internal Medicine

## 2017-06-11 ENCOUNTER — Ambulatory Visit: Payer: BLUE CROSS/BLUE SHIELD | Admitting: Internal Medicine

## 2017-06-12 DIAGNOSIS — G35 Multiple sclerosis: Secondary | ICD-10-CM | POA: Diagnosis not present

## 2017-07-10 ENCOUNTER — Other Ambulatory Visit: Payer: Self-pay

## 2017-07-10 ENCOUNTER — Encounter: Payer: Self-pay | Admitting: Cardiothoracic Surgery

## 2017-07-10 ENCOUNTER — Other Ambulatory Visit: Payer: Self-pay | Admitting: *Deleted

## 2017-07-10 ENCOUNTER — Institutional Professional Consult (permissible substitution): Payer: BLUE CROSS/BLUE SHIELD | Admitting: Cardiothoracic Surgery

## 2017-07-10 VITALS — BP 148/84 | HR 90 | Resp 16 | Ht 66.0 in | Wt 230.0 lb

## 2017-07-10 DIAGNOSIS — R011 Cardiac murmur, unspecified: Secondary | ICD-10-CM

## 2017-07-10 DIAGNOSIS — G35 Multiple sclerosis: Secondary | ICD-10-CM | POA: Diagnosis not present

## 2017-07-10 DIAGNOSIS — I712 Thoracic aortic aneurysm, without rupture: Secondary | ICD-10-CM

## 2017-07-10 DIAGNOSIS — I7121 Aneurysm of the ascending aorta, without rupture: Secondary | ICD-10-CM

## 2017-07-10 NOTE — Progress Notes (Signed)
PCP is Marin Olp, MD Referring Provider is Lacretia Leigh, MD  Chief Complaint  Patient presents with  . 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  She presented with atypical chest pain felt to be probable chest wall in origin with normal EKG and CTA showing no pulmonary embolus.  The patient has been under significant emotional stress lately.  She has been in the process of moving boxes to a new residence.  The patient is an obese hypertensive diabetic non-smoker with multiple sclerosis.  She states her endocrinologist noted she had a cardiac murmur.  She has never had an echocardiogram.  She denies history of rheumatic fever.  Patient takes carvedilol 6.25 twice daily and amlodipine 10 mg daily for blood pressure.  Her blood pressure is  checked monthly when she gets  IV injection for multiple sclerosis at the neurology office.  The patient denies any family history of heart disease, thoracic abdominal aneurysm disease, or cardiac murmur.  Past Medical History:  Diagnosis Date  . Arthritis    mild  . Depression   . Diabetes mellitus    Type 2  . Endometriosis   . History of kidney stones   . Hypertension   . Multiple sclerosis ([place])   . Type II or unspecified type diabetes mellitus with peripheral circulatory disorders, uncontrolled(250.72) 11/27/2012    Past Surgical History:  Procedure Laterality Date  . EXPLORATORY LAPAROTOMY     endometriosis  . EXTRACORPOREAL SHOCK WAVE LITHOTRIPSY Right 11/19/2016   Procedure: RIGHT EXTRACORPOREAL SHOCK WAVE LITHOTRIPSY (ESWL);  Surgeon: Festus Aloe, MD;  Location: [facility];  Service: Urology;  Laterality: Right;  . LUMBAR DISC SURGERY     LS spine 2005 or so   . WISDOM TOOTH EXTRACTION      Family History  Problem Relation Age of Onset  . Breast cancer Mother        early 58s  . Diabetes Mother        maternal grandmother as well  . Diabetes Father   . Other Father        progressive supranuclear palsy    Social History Social History   Tobacco Use  . Smoking status: Never Smoker  . Smokeless tobacco: Never Used  Substance Use Topics  . Alcohol use: Yes    Alcohol/week: 0.0 oz    Comment: occasional  . Drug use: No    Current Outpatient Medications  Medication Sig Dispense Refill  . amLODipine (NORVASC) 10 MG tablet TAKE 1 TABLET DAILY 90 tablet 3  . blood glucose meter kit and supplies KIT Use to test blood sugar two times a day. FREESTYLE Dx:E11.9 1 each 0  . carvedilol (COREG) 6.25 MG tablet TAKE 1 TABLET TWICE A DAY WITH A MEAL (Patient taking differently: Take 6.25 mg by mouth every evening. ) 180 tablet 2  . cetirizine (ZYRTEC) 10 MG tablet Take 10 mg by mouth every evening.     . DULoxetine (CYMBALTA) 60 MG capsule TAKE 1 CAPSULE DAILY 90 capsule 1  . fluticasone (FLONASE) 50 MCG/ACT nasal spray Place 2 sprays into both nostrils daily.    Marland Kitchen glipiZIDE (GLUCOTROL XL) 10 MG 24 hr tablet TAKE 1 TABLET DAILY WITH  BREAKFAST 90 tablet 1  . glucose blood (FREESTYLE TEST STRIPS) test strip TEST BLOOD SUGAR TWICE A DAY. VARY TIMES AS DIRECTED. 200 each 3  . levonorgestrel-ethinyl estradiol (LYBREL,AMETHYST) 90-20 MCG tablet Take 1 tablet by mouth daily.     Marland Kitchen liraglutide (VICTOZA) 18 MG/3ML SOPN Use 1.8 mg under skin (Patient taking differently: Inject 1.5 mg into the skin daily. ) 9 pen 3  . metFORMIN (GLUCOPHAGE-XR) 500 MG 24 hr tablet TAKE 2 TABLETS (1,000 MG TOTAL) TWICE A DAY WITH A MEAL 360 tablet 1  . modafinil (PROVIGIL) 200 MG tablet Take 1 tablet (200 mg total) by mouth daily as needed. 90 tablet 1  . Naproxen Sodium 220 MG CAPS Take 440 mg by mouth 2 (two) times daily as needed (for pain.).     Marland Kitchen natalizumab (TYSABRI)  300 MG/15ML injection Inject 300 mg into the vein every 30 (thirty) days.     No current facility-administered medications for this visit.     Allergies  Allergen Reactions  . Ace Inhibitors Swelling    Edema    Review of Systems  She has not had a recurrent episode of left atypical chest pain since she was evaluated in the ED. She states she has had intermittent leg swelling especially in the summer. 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 Patient's diabetes is not under good control with A1c 8.0  BP (!) 148/84 (BP Location: Right Arm, Patient Position: Sitting, Cuff Size: Large)   Pulse 90   Resp 16   Ht _0  (1.676 m)   Wt 230 lb (104.3 kg)   SpO2 94% Comment: ON RA  BMI 37.12 kg/m  Physical Exam     Physical Exam  General: Pleasant but obese female no acute distress HEENT: Normocephalic pupils equal , dentition adequate Neck: Supple without JVD, adenopathy, or bruit Chest: Clear to auscultation, symmetrical breath sounds, no rhonchi, no tenderness             or deformity Cardiovascular: Regular rate and rhythm, systolic 0/[id number] of aortic sclerosis-stenosis, no gallop, peripheral pulses             palpable in all extremities Abdomen:  Soft, obese, nontender, no palpable mass or organomegaly Extremities: Warm, well-perfused, no clubbing cyanosis edema or tenderness,              no venous stasis changes of the legs Rectal/GU: Deferred Neuro: Grossly non--focal and symmetrical throughout Skin: Clean and dry without rash or ulceration   Diagnostic Tests: CT scan images personally reviewed with patient showing a fusiform ascending aneurysm, no evidence of intramural hematoma or penetrating ulcer.  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  Best therapy is blood pressure control and monitoring and serial surveillance CT scans.  The patient's murmur at the right upper sternal border is consistent with aortic sclerosis but we will proceed with echocardiogram to assess her aortic valve completely.  The presence of a bicuspid aortic valve would also be significant in the process of following her thoracic aortic disease.  Plan: Obtain 2D echocardiogram and return for discussion.  Plan annual CT scan of chest in May 2020..  Importance of blood pressure monitoring and control has been emphasized  to patient with a goal to keeping blood pressure less than 140/90.   Len Childs, MD [facility] [phone]

## 2017-07-17 ENCOUNTER — Ambulatory Visit (HOSPITAL_BASED_OUTPATIENT_CLINIC_OR_DEPARTMENT_OTHER)
Admission: RE | Admit: 2017-07-17 | Discharge: 2017-07-17 | Disposition: A | Discharge status: Home / Self Care | Payer: BLUE CROSS/BLUE SHIELD | Source: Ambulatory Visit | Attending: Cardiothoracic Surgery | Admitting: Cardiothoracic Surgery

## 2017-07-17 DIAGNOSIS — E785 Hyperlipidemia, unspecified: Secondary | ICD-10-CM | POA: Insufficient documentation

## 2017-07-17 DIAGNOSIS — R011 Cardiac murmur, unspecified: Secondary | ICD-10-CM | POA: Insufficient documentation

## 2017-07-17 DIAGNOSIS — E119 Type 2 diabetes mellitus without complications: Secondary | ICD-10-CM | POA: Diagnosis not present

## 2017-07-17 DIAGNOSIS — I10 Essential (primary) hypertension: Secondary | ICD-10-CM | POA: Insufficient documentation

## 2017-07-17 NOTE — Progress Notes (Signed)
  Echocardiogram Echocardiogram Stress Test has been performed.  Jaquelynn Wanamaker T [employer] Z[employer]da 07/17/2017, 1:07 PM

## 2017-07-25 DIAGNOSIS — M62830 Muscle spasm of back: Secondary | ICD-10-CM | POA: Diagnosis not present

## 2017-07-25 DIAGNOSIS — M545 Low back pain: Secondary | ICD-10-CM | POA: Diagnosis not present

## 2017-07-25 DIAGNOSIS — M9903 Segmental and somatic dysfunction of lumbar region: Secondary | ICD-10-CM | POA: Diagnosis not present

## 2017-07-25 DIAGNOSIS — M5136 Other intervertebral disc degeneration, lumbar region: Secondary | ICD-10-CM | POA: Diagnosis not present

## 2017-07-29 ENCOUNTER — Telehealth: Payer: Self-pay | Admitting: Family Medicine

## 2017-07-29 DIAGNOSIS — M5136 Other intervertebral disc degeneration, lumbar region: Secondary | ICD-10-CM | POA: Diagnosis not present

## 2017-07-29 DIAGNOSIS — M62830 Muscle spasm of back: Secondary | ICD-10-CM | POA: Diagnosis not present

## 2017-07-29 DIAGNOSIS — M545 Low back pain: Secondary | ICD-10-CM | POA: Diagnosis not present

## 2017-07-29 DIAGNOSIS — M9903 Segmental and somatic dysfunction of lumbar region: Secondary | ICD-10-CM | POA: Diagnosis not present

## 2017-07-29 NOTE — Telephone Encounter (Signed)
Yes thanks, you may give 90 day supply but she needs to schedule a visit within that time frame for follow up

## 2017-07-29 NOTE — Telephone Encounter (Signed)
Copied from [place] [id number]. Topic: General - Other >> Jul 29, 2017 11:46 AM Carolyn Stare wrote:  Pt call and said she going out of town will be gone a week and a half and is asking if Dr Yong Channel will refill the below med with out seeing her   carvedilol (COREG) 6.25 MG tablet   Pharmacy [facility]

## 2017-07-30 DIAGNOSIS — M5136 Other intervertebral disc degeneration, lumbar region: Secondary | ICD-10-CM | POA: Diagnosis not present

## 2017-07-30 DIAGNOSIS — M545 Low back pain: Secondary | ICD-10-CM | POA: Diagnosis not present

## 2017-07-30 DIAGNOSIS — M9903 Segmental and somatic dysfunction of lumbar region: Secondary | ICD-10-CM | POA: Diagnosis not present

## 2017-07-30 DIAGNOSIS — M62830 Muscle spasm of back: Secondary | ICD-10-CM | POA: Diagnosis not present

## 2017-07-31 ENCOUNTER — Other Ambulatory Visit: Payer: Self-pay

## 2017-07-31 MED ORDER — CARVEDILOL 6.25 MG PO TABS: 6.2500 mg | ORAL_TABLET | Freq: Every evening | ORAL | 0 refills | Status: DC

## 2017-07-31 NOTE — Telephone Encounter (Signed)
Prescription sent to pharmacy as requested. Note placed requested to schedule an appointment prior to further refills

## 2017-08-12 ENCOUNTER — Other Ambulatory Visit: Payer: Self-pay | Admitting: Internal Medicine

## 2017-08-12 DIAGNOSIS — M62830 Muscle spasm of back: Secondary | ICD-10-CM | POA: Diagnosis not present

## 2017-08-12 DIAGNOSIS — M545 Low back pain: Secondary | ICD-10-CM | POA: Diagnosis not present

## 2017-08-12 DIAGNOSIS — M9903 Segmental and somatic dysfunction of lumbar region: Secondary | ICD-10-CM | POA: Diagnosis not present

## 2017-08-12 DIAGNOSIS — M5136 Other intervertebral disc degeneration, lumbar region: Secondary | ICD-10-CM | POA: Diagnosis not present

## 2017-08-13 DIAGNOSIS — M62830 Muscle spasm of back: Secondary | ICD-10-CM | POA: Diagnosis not present

## 2017-08-13 DIAGNOSIS — M5136 Other intervertebral disc degeneration, lumbar region: Secondary | ICD-10-CM | POA: Diagnosis not present

## 2017-08-13 DIAGNOSIS — M9903 Segmental and somatic dysfunction of lumbar region: Secondary | ICD-10-CM | POA: Diagnosis not present

## 2017-08-13 DIAGNOSIS — M545 Low back pain: Secondary | ICD-10-CM | POA: Diagnosis not present

## 2017-08-14 DIAGNOSIS — G35 Multiple sclerosis: Secondary | ICD-10-CM | POA: Diagnosis not present

## 2017-08-15 DIAGNOSIS — M5136 Other intervertebral disc degeneration, lumbar region: Secondary | ICD-10-CM | POA: Diagnosis not present

## 2017-08-15 DIAGNOSIS — M9903 Segmental and somatic dysfunction of lumbar region: Secondary | ICD-10-CM | POA: Diagnosis not present

## 2017-08-15 DIAGNOSIS — M545 Low back pain: Secondary | ICD-10-CM | POA: Diagnosis not present

## 2017-08-15 DIAGNOSIS — M62830 Muscle spasm of back: Secondary | ICD-10-CM | POA: Diagnosis not present

## 2017-08-17 ENCOUNTER — Other Ambulatory Visit: Payer: Self-pay | Admitting: Family Medicine

## 2017-08-19 DIAGNOSIS — M545 Low back pain: Secondary | ICD-10-CM | POA: Diagnosis not present

## 2017-08-19 DIAGNOSIS — M5136 Other intervertebral disc degeneration, lumbar region: Secondary | ICD-10-CM | POA: Diagnosis not present

## 2017-08-19 DIAGNOSIS — M9903 Segmental and somatic dysfunction of lumbar region: Secondary | ICD-10-CM | POA: Diagnosis not present

## 2017-08-19 DIAGNOSIS — M62830 Muscle spasm of back: Secondary | ICD-10-CM | POA: Diagnosis not present

## 2017-08-20 DIAGNOSIS — M9903 Segmental and somatic dysfunction of lumbar region: Secondary | ICD-10-CM | POA: Diagnosis not present

## 2017-08-20 DIAGNOSIS — M545 Low back pain: Secondary | ICD-10-CM | POA: Diagnosis not present

## 2017-08-20 DIAGNOSIS — M5136 Other intervertebral disc degeneration, lumbar region: Secondary | ICD-10-CM | POA: Diagnosis not present

## 2017-08-20 DIAGNOSIS — M62830 Muscle spasm of back: Secondary | ICD-10-CM | POA: Diagnosis not present

## 2017-08-21 ENCOUNTER — Encounter: Payer: Self-pay | Admitting: Family Medicine

## 2017-08-21 ENCOUNTER — Ambulatory Visit: Payer: BLUE CROSS/BLUE SHIELD | Admitting: Family Medicine

## 2017-08-21 VITALS — BP 128/80 | HR 82 | Temp 98.7°F | Ht 66.0 in | Wt 228.4 lb

## 2017-08-21 DIAGNOSIS — I10 Essential (primary) hypertension: Secondary | ICD-10-CM | POA: Diagnosis not present

## 2017-08-21 DIAGNOSIS — F324 Major depressive disorder, single episode, in partial remission: Secondary | ICD-10-CM | POA: Diagnosis not present

## 2017-08-21 DIAGNOSIS — E785 Hyperlipidemia, unspecified: Secondary | ICD-10-CM

## 2017-08-21 DIAGNOSIS — I719 Aortic aneurysm of unspecified site, without rupture: Secondary | ICD-10-CM | POA: Insufficient documentation

## 2017-08-21 DIAGNOSIS — E1151 Type 2 diabetes mellitus with diabetic peripheral angiopathy without gangrene: Secondary | ICD-10-CM | POA: Diagnosis not present

## 2017-08-21 DIAGNOSIS — R911 Solitary pulmonary nodule: Secondary | ICD-10-CM | POA: Insufficient documentation

## 2017-08-21 DIAGNOSIS — E1165 Type 2 diabetes mellitus with hyperglycemia: Secondary | ICD-10-CM

## 2017-08-21 DIAGNOSIS — I712 Thoracic aortic aneurysm, without rupture, unspecified: Secondary | ICD-10-CM

## 2017-08-21 DIAGNOSIS — Z23 Encounter for immunization: Secondary | ICD-10-CM

## 2017-08-21 DIAGNOSIS — IMO0002 Reserved for concepts with insufficient information to code with codable children: Secondary | ICD-10-CM

## 2017-08-21 LAB — LIPID PANEL
CHOLESTEROL: 179 mg/dL (ref 0–200)
HDL: 37.9 mg/dL — ABNORMAL LOW (ref >39.00)
NonHDL: 140.69
TRIGLYCERIDES: 220 mg/dL — AB (ref 0.0–149.0)
Total CHOL/HDL Ratio: 5
VLDL: 44 mg/dL — AB (ref 0.0–40.0)

## 2017-08-21 LAB — COMPREHENSIVE METABOLIC PANEL
ALBUMIN: 3.7 g/dL (ref 3.5–5.2)
ALT: 27 U/L (ref 0–35)
AST: 18 U/L (ref 0–37)
Alkaline Phosphatase: 73 U/L (ref 39–117)
BILIRUBIN TOTAL: 0.4 mg/dL (ref 0.2–1.2)
BUN: 13 mg/dL (ref 6–23)
CALCIUM: 9 mg/dL (ref 8.4–10.5)
CO2: 27 mEq/L (ref 19–32)
Chloride: 101 mEq/L (ref 96–112)
Creatinine, Ser: 0.88 mg/dL (ref 0.40–1.20)
GFR: 72.88 mL/min (ref >60.00)
Glucose, Bld: 273 mg/dL — ABNORMAL HIGH (ref 70–99)
POTASSIUM: 4.1 meq/L (ref 3.5–5.1)
Sodium: 136 mEq/L (ref 135–145)
TOTAL PROTEIN: 6.5 g/dL (ref 6.0–8.3)

## 2017-08-21 LAB — CBC
HEMATOCRIT: 40.2 % (ref 36.0–46.0)
HEMOGLOBIN: 13.7 g/dL (ref 12.0–15.0)
MCHC: 34.1 g/dL (ref 30.0–36.0)
MCV: 85.2 fl (ref 78.0–100.0)
PLATELETS: 315 10*3/uL (ref 150.0–400.0)
RBC: 4.73 Mil/uL (ref 3.87–5.11)
RDW: 14.2 % (ref 11.5–15.5)
WBC: 11.3 10*3/uL — AB (ref 4.0–10.5)

## 2017-08-21 LAB — LDL CHOLESTEROL, DIRECT: LDL DIRECT: 125 mg/dL

## 2017-08-21 MED ORDER — PRAVASTATIN SODIUM 10 MG PO TABS: 10.0000 mg | ORAL_TABLET | Freq: Every day | ORAL | 5 refills | Status: DC

## 2017-08-21 MED ORDER — CARVEDILOL 6.25 MG PO TABS: 6.2500 mg | ORAL_TABLET | Freq: Two times a day (BID) | ORAL | 0 refills | Status: DC

## 2017-08-21 MED ORDER — CARVEDILOL 6.25 MG PO TABS: 6.2500 mg | ORAL_TABLET | Freq: Two times a day (BID) | ORAL | 1 refills | Status: DC

## 2017-08-21 NOTE — Patient Instructions (Addendum)
Health Maintenance Due  Topic Date Due  . TETANUS/TDAP - Today at office visit 01/15/2017  . OPHTHALMOLOGY EXAM - Patient will schedule an appointment with eye Dr. 06/15/2017  . HEMOGLOBIN A1C - next month 07/31/2017  . INFLUENZA VACCINE - Will await until October 08/15/2017  . FOOT EXAM -Today at office visit 08/21/2017  . URINE MICROALBUMIN - Today at office visit 08/21/2017   Please stop by the lab before you go.  Lets check in 6 months from now to keep an eye on the blood pressure now that we know you have an aneurysm

## 2017-08-21 NOTE — Progress Notes (Signed)
Subjective:  Kristin Mathews is a 47 y.o. year old very pleasant female patient who presents for/with See problem oriented charting ROS- no chest pain or shortness of breath reported. No fever or chills.    Past Medical History-  Patient Active Problem List   Diagnosis Date Noted  . Aortic aneurysm ([place]) 08/21/2017    Priority: High  . Pulmonary nodule 08/21/2017    Priority: High  . Immunosuppressed status ([place]) 03/05/2017    Priority: High  . Uncontrolled type 2 diabetes mellitus with peripheral circulatory disorder ([place]) 11/27/2012    Priority: High  . Multiple sclerosis ([place]) 07/05/2006    Priority: High  . Nephrolithiasis 08/21/2016    Priority: Medium  . Depression 02/25/2008    Priority: Medium  . Hyperlipidemia 08/29/2006    Priority: Medium  . Essential hypertension 07/05/2006    Priority: Medium  . Gait disturbance 04/17/2016    Priority: Low  . Tingling 04/17/2016    Priority: Low  . Snoring 04/17/2016    Priority: Low  . Urinary urgency 04/17/2016    Priority: Low  . Excessive sleepiness 04/17/2016    Priority: Low  . Allergic rhinitis 04/27/2014    Priority: Low  . Low back pain 04/27/2014    Priority: Low  . Endometriosis 07/05/2006    Priority: Low  . Class II obesity 10/09/2016    Medications- reviewed and updated Current Outpatient Medications  Medication Sig Dispense Refill  . amLODipine (NORVASC) 10 MG tablet TAKE 1 TABLET DAILY 90 tablet 3  . carvedilol (COREG) 6.25 MG tablet Take 1 tablet (6.25 mg total) by mouth 2 (two) times daily. 180 tablet 1  . cetirizine (ZYRTEC) 10 MG tablet Take 10 mg by mouth every evening.     . DULoxetine (CYMBALTA) 60 MG capsule TAKE 1 CAPSULE DAILY 90 capsule 1  . fluticasone (FLONASE) 50 MCG/ACT nasal spray Place 2 sprays into both nostrils daily.    Marland Kitchen glipiZIDE (GLUCOTROL XL) 10 MG 24 hr tablet TAKE 1 TABLET DAILY WITH BREAKFAST 90 tablet 1  . glucose blood (FREESTYLE TEST STRIPS) test strip TEST BLOOD SUGAR  TWICE A DAY. VARY TIMES AS DIRECTED. 200 each 3  . insulin lispro (HUMALOG) 100 UNIT/ML injection USE WITH VGO 40 TO INJECT 76 UNITS UNDER THE SKIN ONCE AS DIRECTED 70 mL 0  . levonorgestrel-ethinyl estradiol (LYBREL,AMETHYST) 90-20 MCG tablet Take 1 tablet by mouth daily.     Marland Kitchen liraglutide (VICTOZA) 18 MG/3ML SOPN Use 1.8 mg under skin (Patient taking differently: Inject 1.5 mg into the skin daily. ) 9 pen 3  . metFORMIN (GLUCOPHAGE-XR) 500 MG 24 hr tablet TAKE 2 TABLETS (1,000 MG TOTAL) TWICE A DAY WITH A MEAL 360 tablet 1  . modafinil (PROVIGIL) 200 MG tablet Take 1 tablet (200 mg total) by mouth daily as needed. 90 tablet 1  . Naproxen Sodium 220 MG CAPS Take 440 mg by mouth 2 (two) times daily as needed (for pain.).     Marland Kitchen natalizumab (TYSABRI) 300 MG/15ML injection Inject 300 mg into the vein every 30 (thirty) days.    . pravastatin (PRAVACHOL) 10 MG tablet Take 1 tablet (10 mg total) by mouth daily. 30 tablet 5   No current facility-administered medications for this visit.     Objective: BP 128/80   Pulse 82   Temp 98.7 F (37.1 C) (Oral)   Ht 5\' 6"  (1.676 m)   Wt 228 lb 6.4 oz (103.6 kg)   SpO2 95%   BMI 36.86  kg/m  Gen: NAD, resting comfortably CV: RRR 2/6 systolic ejection murmur Lungs: CTAB no crackles, wheeze, rhonchi Abdomen: soft/nontender/nondistended/normal bowel sounds.   Ext: no edema Skin: warm, dry Diabetic Foot Exam - Simple   Simple Foot Form Diabetic Foot exam was performed with the following findings:  Yes 08/21/2017  8:33 AM  Visual Inspection No deformities, no ulcerations, no other skin breakdown bilaterally:  Yes Sensation Testing Intact to touch and monofilament testing bilaterally:  Yes Pulse Check Posterior Tibialis and Dorsalis pulse intact bilaterally:  Yes Comments    Assessment/Plan:  Essential hypertension S: controlled on repeat on amlodipine 10mg , coreg 6.25mg  BID. Angioedema on ace- I. hctz has been on in the past and intermittent  potassium issues BP Readings from Last 3 Encounters:  08/21/17 128/80  07/10/17 (!) 148/84  05/30/17 (!) 149/95  A/P: We discussed blood pressure goal of <140/90. Continue current meds. Need to make sure to repeat BP if initial elevated including at outside offices if possible  Hyperlipidemia S: likely poorly controlled on no rx- she was having side effects from either pravastatin or another medicine- she was not sure which and stopped the pravastatin- symptoms resolved- very hungry Lab Results  Component Value Date   CHOL 179 08/21/2017   HDL 37.90 (L) 08/21/2017   LDLDIRECT 125.0 08/21/2017   TRIG 220.0 (H) 08/21/2017   CHOLHDL 5 08/21/2017   A/P: updated lipids show poor control- we will trial lower dose at 10mg  of pravastatin to see how she does  Depression S: controlled on cymbalta in the past. Mild poor control today with phq9 over 5- she admits to feeling down- weight of her illness contributes A/P: we discussed referring her to behavioral health- she agrees- may trial [place] or [employer] petrini  Aortic aneurysm ([place]) Noted on Ct angiogram 05/2017 with notes mentioning 1 year follow up. Has follow up next month with cardiothoracic surgery. Prefer to keep BP <130/80 ideally  Pulmonary nodule 05/2017- 5 mm nodule- 1 year follow up so 05/2018  She declines a1c until she sees endocrine Future Appointments  Date Time Provider [place]  09/18/2017  9:30 AM Ivin Poot, MD [facility]  10/07/2017  1:00 PM [name], MD [facility] None  02/21/2018  8:45 AM Marin Olp, MD [facility]   6 month BP check advised Lab/Order associations: Uncontrolled type 2 diabetes mellitus with peripheral circulatory disorder ([place])  Hyperlipidemia, unspecified hyperlipidemia type - Plan: CBC, Comprehensive metabolic panel, Lipid panel  Essential hypertension - Plan: CBC, Comprehensive metabolic panel, Lipid panel  Need for prophylactic vaccination with combined  diphtheria-tetanus-pertussis (DTP) vaccine - Plan: Tdap vaccine greater than or equal to 7yo IM  Major depressive disorder with single episode, in partial remission [facility])  Thoracic aortic aneurysm without rupture ([place])  Pulmonary nodule  Meds ordered this encounter  Medications  . DISCONTD: carvedilol (COREG) 6.25 MG tablet    Sig: Take 1 tablet (6.25 mg total) by mouth 2 (two) times daily.    Dispense:  60 tablet    Refill:  0    Please schedule an appointment prior to further refills  . carvedilol (COREG) 6.25 MG tablet    Sig: Take 1 tablet (6.25 mg total) by mouth 2 (two) times daily.    Dispense:  180 tablet    Refill:  1    Please schedule an appointment prior to further refills  . pravastatin (PRAVACHOL) 10 MG tablet    Sig: Take 1 tablet (10 mg total) by mouth daily.  Dispense:  30 tablet    Refill:  5    Return precautions advised.  Garret Reddish, MD

## 2017-08-21 NOTE — Assessment & Plan Note (Signed)
S: controlled on repeat on amlodipine 10mg , coreg 6.25mg  BID. Angioedema on ace- I. hctz has been on in the past and intermittent potassium issues BP Readings from Last 3 Encounters:  08/21/17 128/80  07/10/17 (!) 148/84  05/30/17 (!) 149/95  A/P: We discussed blood pressure goal of <140/90. Continue current meds. Need to make sure to repeat BP if initial elevated including at outside offices if possible

## 2017-08-21 NOTE — Assessment & Plan Note (Signed)
S: likely poorly controlled on no rx- she was having side effects from either pravastatin or another medicine- she was not sure which and stopped the pravastatin- symptoms resolved- very hungry Lab Results  Component Value Date   CHOL 179 08/21/2017   HDL 37.90 (L) 08/21/2017   LDLDIRECT 125.0 08/21/2017   TRIG 220.0 (H) 08/21/2017   CHOLHDL 5 08/21/2017   A/P: updated lipids show poor control- we will trial lower dose at 10mg  of pravastatin to see how she does

## 2017-08-21 NOTE — Assessment & Plan Note (Signed)
05/2017- 5 mm nodule- 1 year follow up so 05/2018

## 2017-08-21 NOTE — Assessment & Plan Note (Signed)
Noted on Ct angiogram 05/2017 with notes mentioning 1 year follow up. Has follow up next month with cardiothoracic surgery. Prefer to keep BP <130/80 ideally

## 2017-08-21 NOTE — Assessment & Plan Note (Signed)
S: controlled on cymbalta in the past. Mild poor control today with phq9 over 5- she admits to feeling down- weight of her illness contributes A/P: we discussed referring her to behavioral health- she agrees- may trial  or [employer] petrini

## 2017-08-22 DIAGNOSIS — M62830 Muscle spasm of back: Secondary | ICD-10-CM | POA: Diagnosis not present

## 2017-08-22 DIAGNOSIS — M9903 Segmental and somatic dysfunction of lumbar region: Secondary | ICD-10-CM | POA: Diagnosis not present

## 2017-08-22 DIAGNOSIS — M545 Low back pain: Secondary | ICD-10-CM | POA: Diagnosis not present

## 2017-08-22 DIAGNOSIS — M5136 Other intervertebral disc degeneration, lumbar region: Secondary | ICD-10-CM | POA: Diagnosis not present

## 2017-08-26 DIAGNOSIS — M62830 Muscle spasm of back: Secondary | ICD-10-CM | POA: Diagnosis not present

## 2017-08-26 DIAGNOSIS — M5136 Other intervertebral disc degeneration, lumbar region: Secondary | ICD-10-CM | POA: Diagnosis not present

## 2017-08-26 DIAGNOSIS — M545 Low back pain: Secondary | ICD-10-CM | POA: Diagnosis not present

## 2017-08-26 DIAGNOSIS — M9903 Segmental and somatic dysfunction of lumbar region: Secondary | ICD-10-CM | POA: Diagnosis not present

## 2017-08-27 DIAGNOSIS — M545 Low back pain: Secondary | ICD-10-CM | POA: Diagnosis not present

## 2017-08-27 DIAGNOSIS — M5136 Other intervertebral disc degeneration, lumbar region: Secondary | ICD-10-CM | POA: Diagnosis not present

## 2017-08-27 DIAGNOSIS — M62830 Muscle spasm of back: Secondary | ICD-10-CM | POA: Diagnosis not present

## 2017-08-27 DIAGNOSIS — M9903 Segmental and somatic dysfunction of lumbar region: Secondary | ICD-10-CM | POA: Diagnosis not present

## 2017-08-29 DIAGNOSIS — M9903 Segmental and somatic dysfunction of lumbar region: Secondary | ICD-10-CM | POA: Diagnosis not present

## 2017-08-29 DIAGNOSIS — M5136 Other intervertebral disc degeneration, lumbar region: Secondary | ICD-10-CM | POA: Diagnosis not present

## 2017-08-29 DIAGNOSIS — M545 Low back pain: Secondary | ICD-10-CM | POA: Diagnosis not present

## 2017-08-29 DIAGNOSIS — M62830 Muscle spasm of back: Secondary | ICD-10-CM | POA: Diagnosis not present

## 2017-09-02 DIAGNOSIS — M9903 Segmental and somatic dysfunction of lumbar region: Secondary | ICD-10-CM | POA: Diagnosis not present

## 2017-09-02 DIAGNOSIS — M5136 Other intervertebral disc degeneration, lumbar region: Secondary | ICD-10-CM | POA: Diagnosis not present

## 2017-09-02 DIAGNOSIS — M62830 Muscle spasm of back: Secondary | ICD-10-CM | POA: Diagnosis not present

## 2017-09-02 DIAGNOSIS — M545 Low back pain: Secondary | ICD-10-CM | POA: Diagnosis not present

## 2017-09-03 DIAGNOSIS — M62830 Muscle spasm of back: Secondary | ICD-10-CM | POA: Diagnosis not present

## 2017-09-03 DIAGNOSIS — M545 Low back pain: Secondary | ICD-10-CM | POA: Diagnosis not present

## 2017-09-03 DIAGNOSIS — M5136 Other intervertebral disc degeneration, lumbar region: Secondary | ICD-10-CM | POA: Diagnosis not present

## 2017-09-03 DIAGNOSIS — M9903 Segmental and somatic dysfunction of lumbar region: Secondary | ICD-10-CM | POA: Diagnosis not present

## 2017-09-05 DIAGNOSIS — M5136 Other intervertebral disc degeneration, lumbar region: Secondary | ICD-10-CM | POA: Diagnosis not present

## 2017-09-05 DIAGNOSIS — M62830 Muscle spasm of back: Secondary | ICD-10-CM | POA: Diagnosis not present

## 2017-09-05 DIAGNOSIS — M9903 Segmental and somatic dysfunction of lumbar region: Secondary | ICD-10-CM | POA: Diagnosis not present

## 2017-09-05 DIAGNOSIS — M545 Low back pain: Secondary | ICD-10-CM | POA: Diagnosis not present

## 2017-09-07 ENCOUNTER — Other Ambulatory Visit: Payer: Self-pay | Admitting: Internal Medicine

## 2017-09-09 DIAGNOSIS — M5136 Other intervertebral disc degeneration, lumbar region: Secondary | ICD-10-CM | POA: Diagnosis not present

## 2017-09-09 DIAGNOSIS — M9903 Segmental and somatic dysfunction of lumbar region: Secondary | ICD-10-CM | POA: Diagnosis not present

## 2017-09-09 DIAGNOSIS — M545 Low back pain: Secondary | ICD-10-CM | POA: Diagnosis not present

## 2017-09-09 DIAGNOSIS — M62830 Muscle spasm of back: Secondary | ICD-10-CM | POA: Diagnosis not present

## 2017-09-11 ENCOUNTER — Encounter: Payer: Self-pay | Admitting: *Deleted

## 2017-09-11 DIAGNOSIS — Z79899 Other long term (current) drug therapy: Secondary | ICD-10-CM

## 2017-09-11 DIAGNOSIS — G35 Multiple sclerosis: Secondary | ICD-10-CM | POA: Diagnosis not present

## 2017-09-12 DIAGNOSIS — M545 Low back pain: Secondary | ICD-10-CM | POA: Diagnosis not present

## 2017-09-12 DIAGNOSIS — M5136 Other intervertebral disc degeneration, lumbar region: Secondary | ICD-10-CM | POA: Diagnosis not present

## 2017-09-12 DIAGNOSIS — M62830 Muscle spasm of back: Secondary | ICD-10-CM | POA: Diagnosis not present

## 2017-09-12 DIAGNOSIS — M9903 Segmental and somatic dysfunction of lumbar region: Secondary | ICD-10-CM | POA: Diagnosis not present

## 2017-09-15 ENCOUNTER — Other Ambulatory Visit: Payer: Self-pay | Admitting: Family Medicine

## 2017-09-17 DIAGNOSIS — M5136 Other intervertebral disc degeneration, lumbar region: Secondary | ICD-10-CM | POA: Diagnosis not present

## 2017-09-17 DIAGNOSIS — M62830 Muscle spasm of back: Secondary | ICD-10-CM | POA: Diagnosis not present

## 2017-09-17 DIAGNOSIS — M9903 Segmental and somatic dysfunction of lumbar region: Secondary | ICD-10-CM | POA: Diagnosis not present

## 2017-09-17 DIAGNOSIS — M545 Low back pain: Secondary | ICD-10-CM | POA: Diagnosis not present

## 2017-09-18 ENCOUNTER — Encounter: Payer: BLUE CROSS/BLUE SHIELD | Admitting: Cardiothoracic Surgery

## 2017-09-19 ENCOUNTER — Encounter: Payer: BLUE CROSS/BLUE SHIELD | Admitting: Cardiothoracic Surgery

## 2017-09-19 DIAGNOSIS — M9903 Segmental and somatic dysfunction of lumbar region: Secondary | ICD-10-CM | POA: Diagnosis not present

## 2017-09-19 DIAGNOSIS — M62830 Muscle spasm of back: Secondary | ICD-10-CM | POA: Diagnosis not present

## 2017-09-19 DIAGNOSIS — M5136 Other intervertebral disc degeneration, lumbar region: Secondary | ICD-10-CM | POA: Diagnosis not present

## 2017-09-19 DIAGNOSIS — M545 Low back pain: Secondary | ICD-10-CM | POA: Diagnosis not present

## 2017-09-20 ENCOUNTER — Other Ambulatory Visit: Payer: Self-pay | Admitting: Internal Medicine

## 2017-09-24 DIAGNOSIS — M62830 Muscle spasm of back: Secondary | ICD-10-CM | POA: Diagnosis not present

## 2017-09-24 DIAGNOSIS — M545 Low back pain: Secondary | ICD-10-CM | POA: Diagnosis not present

## 2017-09-24 DIAGNOSIS — M9903 Segmental and somatic dysfunction of lumbar region: Secondary | ICD-10-CM | POA: Diagnosis not present

## 2017-09-24 DIAGNOSIS — M5136 Other intervertebral disc degeneration, lumbar region: Secondary | ICD-10-CM | POA: Diagnosis not present

## 2017-09-26 DIAGNOSIS — M9903 Segmental and somatic dysfunction of lumbar region: Secondary | ICD-10-CM | POA: Diagnosis not present

## 2017-09-26 DIAGNOSIS — M545 Low back pain: Secondary | ICD-10-CM | POA: Diagnosis not present

## 2017-09-26 DIAGNOSIS — M5136 Other intervertebral disc degeneration, lumbar region: Secondary | ICD-10-CM | POA: Diagnosis not present

## 2017-09-26 DIAGNOSIS — M62830 Muscle spasm of back: Secondary | ICD-10-CM | POA: Diagnosis not present

## 2017-09-30 DIAGNOSIS — M545 Low back pain: Secondary | ICD-10-CM | POA: Diagnosis not present

## 2017-09-30 DIAGNOSIS — M5136 Other intervertebral disc degeneration, lumbar region: Secondary | ICD-10-CM | POA: Diagnosis not present

## 2017-09-30 DIAGNOSIS — M9903 Segmental and somatic dysfunction of lumbar region: Secondary | ICD-10-CM | POA: Diagnosis not present

## 2017-09-30 DIAGNOSIS — M62830 Muscle spasm of back: Secondary | ICD-10-CM | POA: Diagnosis not present

## 2017-10-03 DIAGNOSIS — M545 Low back pain: Secondary | ICD-10-CM | POA: Diagnosis not present

## 2017-10-03 DIAGNOSIS — M5136 Other intervertebral disc degeneration, lumbar region: Secondary | ICD-10-CM | POA: Diagnosis not present

## 2017-10-03 DIAGNOSIS — M62830 Muscle spasm of back: Secondary | ICD-10-CM | POA: Diagnosis not present

## 2017-10-03 DIAGNOSIS — M9903 Segmental and somatic dysfunction of lumbar region: Secondary | ICD-10-CM | POA: Diagnosis not present

## 2017-10-07 ENCOUNTER — Ambulatory Visit: Payer: BLUE CROSS/BLUE SHIELD | Admitting: Internal Medicine

## 2017-10-07 ENCOUNTER — Encounter: Payer: Self-pay | Admitting: Internal Medicine

## 2017-10-07 VITALS — BP 130/88 | HR 91 | Ht 66.0 in | Wt 231.0 lb

## 2017-10-07 DIAGNOSIS — E669 Obesity, unspecified: Secondary | ICD-10-CM | POA: Diagnosis not present

## 2017-10-07 DIAGNOSIS — Z23 Encounter for immunization: Secondary | ICD-10-CM

## 2017-10-07 DIAGNOSIS — E1165 Type 2 diabetes mellitus with hyperglycemia: Secondary | ICD-10-CM

## 2017-10-07 DIAGNOSIS — E785 Hyperlipidemia, unspecified: Secondary | ICD-10-CM

## 2017-10-07 DIAGNOSIS — E1151 Type 2 diabetes mellitus with diabetic peripheral angiopathy without gangrene: Secondary | ICD-10-CM

## 2017-10-07 DIAGNOSIS — IMO0002 Reserved for concepts with insufficient information to code with codable children: Secondary | ICD-10-CM

## 2017-10-07 LAB — POCT GLYCOSYLATED HEMOGLOBIN (HGB A1C): Hemoglobin A1C: 7.9 % — AB (ref 4.0–5.6)

## 2017-10-07 MED ORDER — V-GO 40 KIT: PACK | 3 refills | Status: DC

## 2017-10-07 NOTE — Progress Notes (Signed)
Patient ID: Kristin Mathews, female   DOB: 1970/11/25, 47 y.o.   MRN: [id number]  HPI: Kristin Mathews is a 47 y.o.-year-old female, returning for f/u for DM2, dx 2009, insulin-dependent since 2009, uncontrolled, without long term complications. Last visit 8 months ago.  Since last visit, she had CP >> ED >> found to have an unruptured Ascending Ao Aneurysm.  She will have an appointment with cardiology soon.   Last hemoglobin A1c was: Lab Results  Component Value Date   HGBA1C 9 01/31/2017   HGBA1C 8.3 (H) 08/21/2016   HGBA1C 7.6 01/23/2016  She uses frequent steroid courses for MS.  She is on: - Metformin ER 1000 mg twice a day - Glipizide XL 10 mg in a.m. - Victoza 1.8 mg daily in a.m. >> Bydureon 2 mg weekly in last 1.5 mo >> Victoza 1.8 mg daily in a.m.-restarted 01/2017 - ACZ66 >> AYT01 1 click for 15 >> 1 click for 10 g carbs She was on Invokana 100 >> 300 >> had few yeast infections - stopped in 05/2013 and tried again in 2017 >> stopped again b/c yeast inf. We stopped Humulin 70/30 25 units bid. Januvia - did not help.  Glumetza not covered by insurance.  Pt checks her sugars 1-2 times a day: - am:  69-131, 150 >> 74-136, 170 >> 94-142, 176 >> 79-151 - 2h after b'fast: 166 >> n/c >> 194 >> n/c - lunch: 82-148, 172, 203 >> n/c >> 160, 219 >> 103 - 2h after lunch:169-206, 273 >> n/c >> 253 >> 66, 186 - dinner:  64-156, 187 >> 83, 104-201 >> 75-227 - 2h after dinner:  208 >> 91, 208 >> 138-191 >> 54, 192 - bedtime:  134, 152, 314 >> 123, 131 >> 260 >> 84-239, 287 - nighttime: 41, 43, 60, 134 >> 47-187 >> 145 >> n/c Lowest CBG: 21!! (at night) ... >> 83 >> 54; she has hypoglycemia awareness in the 81s. Highest 276 >> 260 (forgot to bolus) >> 287.  Pt's meals are: - Breakfast: yoghurt + granola, cereals (3-4 pushes) - Lunch: sandwich, sushi, [place] (7-10 pushes) - Dinner: home cooked meal: stews, etc.(7-10 pushes) - Snacks: 3-4 (2 pushes)   -No CKD, last BUN/creatinine:  Lab  Results  Component Value Date   BUN 13 08/21/2017   CREATININE 0.88 08/21/2017  She was previously on benazepril, but developed angioedema from ACE inhibitors in 12/2012! - + HL; last set of lipids: Lab Results  Component Value Date   CHOL 179 08/21/2017   HDL 37.90 (L) 08/21/2017   LDLCALC 105 (H) 08/21/2016   LDLDIRECT 125.0 08/21/2017   TRIG 220.0 (H) 08/21/2017   CHOLHDL 5 08/21/2017  On Pravastatin added back 08/2017. - last eye exam was 07/2016: No DR -+ Mild numbness and tingling in her feet. Foot exam normal by PCP 04/2016.  She also has a history of MS (neuro Dr Kristin Mathews) - last 2 atacks: 02/2012, 2010; also HTN, fatty liver, endometriosis. She had a kidney stone in 11/2016 (calcium oxalate)- had litotripsy. She had a total of 20-25 kidney stones throughout her life.  She travels a lot for her job.  Since last visit, she went to [place].   ROS: Constitutional: no weight gain/no weight loss, + fatigue, + subjective hyperthermia, no subjective hypothermia Eyes: no blurry vision, no xerophthalmia ENT: no sore throat, no nodules palpated in throat, no dysphagia, no odynophagia, no hoarseness Cardiovascular: no CP/no SOB/no palpitations/no leg swelling Respiratory: no cough/no SOB/no wheezing  Gastrointestinal: no N/no V/no D/no C/no acid reflux Musculoskeletal: no muscle aches/no joint aches Skin: no rashes, no hair loss Neurological: no tremors/+ numbness/+ tingling/no dizziness  I reviewed pt's medications, allergies, PMH, social hx, family hx, and changes were documented in the history of present illness. Otherwise, unchanged from my initial visit note.  Past Medical History:  Diagnosis Date  . Arthritis    mild  . Depression   . Diabetes mellitus    Type 2  . Endometriosis   . History of kidney stones   . Hypertension   . Multiple sclerosis (Kristin Mathews)   . Type II or unspecified type diabetes mellitus with peripheral circulatory disorders, uncontrolled(250.72)  11/27/2012   Past Surgical History:  Procedure Laterality Date  . EXPLORATORY LAPAROTOMY     endometriosis  . EXTRACORPOREAL SHOCK WAVE LITHOTRIPSY Right 11/19/2016   Procedure: RIGHT EXTRACORPOREAL SHOCK WAVE LITHOTRIPSY (ESWL);  Surgeon: Kristin Aloe, MD;  Location: [facility];  Service: Urology;  Laterality: Right;  . LUMBAR DISC SURGERY     LS spine 2005 or so  . WISDOM TOOTH EXTRACTION     Social History   Socioeconomic History  . Marital status: Single    Spouse name: Not on file  . Number of children: Not on file  . Years of education: Not on file  . Highest education level: Not on file  Occupational History  . Not on file  Social Needs  . Financial resource strain: Not on file  . Food insecurity:    Worry: Not on file    Inability: Not on file  . Transportation needs:    Medical: Not on file    Non-medical: Not on file  Tobacco Use  . Smoking status: Never Smoker  . Smokeless tobacco: Never Used  Substance and Sexual Activity  . Alcohol use: Yes    Alcohol/week: 0.0 standard drinks    Comment: occasional  . Drug use: No  . Sexual activity: Not on file  Lifestyle  . Physical activity:    Days per week: Not on file    Minutes per session: Not on file  . Stress: Not on file  Relationships  . Social connections:    Talks on phone: Not on file    Gets together: Not on file    Attends religious service: Not on file    Active member of club or organization: Not on file    Attends meetings of clubs or organizations: Not on file    Relationship status: Not on file  . Intimate partner violence:    Fear of current or ex partner: Not on file    Emotionally abused: Not on file    Physically abused: Not on file    Forced sexual activity: Not on file  Other Topics Concern  . Not on file  Social History Narrative   Single. Lives alone. No children. Pet cat.       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 amLODipine (NORVASC) 10 MG tablet TAKE 1 TABLET DAILY 90 tablet 3  . carvedilol (COREG) 6.25 MG tablet Take 1 tablet (6.25 mg total) by mouth 2 (two) times daily. 180 tablet 1  . carvedilol (COREG) 6.25 MG tablet TAKE 1 TABLET BY  MOUTH TWICE A DAY 60 tablet 0  . cetirizine (ZYRTEC) 10 MG tablet Take 10 mg by mouth every evening.     . DULoxetine (CYMBALTA) 60 MG capsule TAKE 1 CAPSULE DAILY 90 capsule 1  . fluticasone (FLONASE) 50 MCG/ACT nasal spray Place 2 sprays into both nostrils daily.    Marland Kitchen glipiZIDE (GLUCOTROL XL) 10 MG 24 hr tablet TAKE 1 TABLET DAILY WITH BREAKFAST 90 tablet 1  . glucose blood (FREESTYLE TEST STRIPS) test strip TEST BLOOD SUGAR TWICE A DAY. VARY TIMES AS DIRECTED. 200 each 3  . Insulin Disposable Pump (V-GO 40) KIT USE DAILY 30 kit 12  . insulin lispro (HUMALOG) 100 UNIT/ML injection USE WITH VGO 40 TO INJECT 76 UNITS UNDER THE SKIN ONCE AS DIRECTED 70 mL 0  . levonorgestrel-ethinyl estradiol (LYBREL,AMETHYST) 90-20 MCG tablet Take 1 tablet by mouth daily.     Marland Kitchen liraglutide (VICTOZA) 18 MG/3ML SOPN Use 1.8 mg under skin (Patient taking differently: Inject 1.5 mg into the skin daily. ) 9 pen 3  . metFORMIN (GLUCOPHAGE-XR) 500 MG 24 hr tablet TAKE 2 TABLETS (1,000 MG TOTAL) TWICE A DAY WITH A MEAL 360 tablet 4  . modafinil (PROVIGIL) 200 MG tablet Take 1 tablet (200 mg total) by mouth daily as needed. 90 tablet 1  . Naproxen Sodium 220 MG CAPS Take 440 mg by mouth 2 (two) times daily as needed (for pain.).     Marland Kitchen natalizumab (TYSABRI) 300 MG/15ML injection Inject 300 mg into the vein every 30 (thirty) days.    . pravastatin (PRAVACHOL) 10 MG tablet Take 1 tablet (10 mg total) by mouth daily. 30 tablet 5   No current facility-administered medications on file prior to visit.    Allergies  Allergen Reactions  .  Ace Inhibitors Swelling    Edema   Family History  Problem Relation Age of Onset  . Breast cancer Mother        early 48s  . Diabetes Mother        maternal grandmother as well  . Diabetes Father   . Other Father        progressive supranuclear palsy    PE: BP 130/88   Pulse 91   Ht '5\' 6"'  (1.676 m)   Wt 231 lb (104.8 kg)   SpO2 97%   BMI 37.28 kg/m  Body mass index is 37.28 kg/m. Wt Readings from Last 3 Encounters:  10/07/17 231 lb (104.8 kg)  08/21/17 228 lb 6.4 oz (103.6 kg)  07/10/17 230 lb (104.3 kg)   Constitutional: overweight, in NAD Eyes: PERRLA, EOMI, no exophthalmos ENT: moist mucous membranes, no thyromegaly, no cervical lymphadenopathy Cardiovascular: RRR, No RG, +1 SEM Respiratory: CTA B Gastrointestinal: abdomen soft, NT, ND, BS+ Musculoskeletal: no deformities, strength intact in all 4 Skin: moist, warm, no rashes Neurological: no tremor with outstretched hands, DTR normal in all 4  ASSESSMENT: 1. DM2, insulin-dependent, uncontrolled, with hyperglycemia  She saw the diabetes educator for a discussion about insulin pumps >> decided for a VGo  2. Obesity class 2 BMI Classification:  < 18.5 underweight   18.5-24.9 normal weight   25.0-29.9 overweight   30.0-34.9 class I obesity   35.0-39.9 class II obesity   ? 40.0 class III obesity   3. HL  PLAN:  1. 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  At last visit, HbA1c was high, at  9% and we changed to a Arp 40 and went back to [place], from [place]. - At this visit, she tells me that she was more stressed and depressed over the summer.  Her sugars are fluctuating due to her eating habits.  She ate more and she is a stress eater.  She attributes her hyperglycemic spikes to her dietary indiscretions and is aware that the regimen that she is on cannot cover these. - We  discussed about different options to improve her eating habits and cravings.  I do not feel she would benefit from gastric bypass or at least not now.  We discussed other options, including appetite suppression (she is already on Victoza which should help with this) and also hypnosis. - Unfortunately, I cannot change her regimen for now, since there is no pattern in her CBG fluctuations. -  I advised her to:  Patient Instructions  Please continue: - Metformin ER 1000 mg 2x a day - Glipizide XL 10 mg in am - Victoza 1.8 mg daily in a.m. - VGo 40 - 1 click for 10 g carbs  Please return in 3 months with your sugar log.   - today, HbA1c is 7.9% (improved) - continue checking sugars at different times of the day - check 1x a day, rotating checks - advised for yearly eye exams >> she is not UTD - We will give her the flu shot today - Return to clinic in 3 mo with sugar log    2. Obesity class 2 -At last visit, we restarted Victoza.  This should also help with weight loss. -Weight is a little better at this visit  3. HL - Reviewed latest lipid panel from last month: LDL has increased, triglycerides are also high and HDL low Lab Results  Component Value Date   CHOL 179 08/21/2017   HDL 37.90 (L) 08/21/2017   LDLCALC 105 (H) 08/21/2016   LDLDIRECT 125.0 08/21/2017   TRIG 220.0 (H) 08/21/2017   CHOLHDL 5 08/21/2017  - Continues pravastatin without side effects.   [name], MD PhD [facility] Endocrinology

## 2017-10-07 NOTE — Addendum Note (Signed)
Addended by: Cardell Peach I on: 10/07/2017 01:37 PM   Modules accepted: Orders

## 2017-10-07 NOTE — Patient Instructions (Addendum)
  Please continue: - Metformin ER 1000 mg 2x a day - Glipizide XL 10 mg in am - Victoza 1.8 mg daily in a.m. - VGo 40 - 1 click for 10 g carbs  Please return in 3 months with your sugar log.

## 2017-10-08 ENCOUNTER — Encounter: Payer: Self-pay | Admitting: Neurology

## 2017-10-08 DIAGNOSIS — G35 Multiple sclerosis: Secondary | ICD-10-CM | POA: Diagnosis not present

## 2017-10-09 ENCOUNTER — Ambulatory Visit: Payer: BLUE CROSS/BLUE SHIELD | Admitting: Cardiothoracic Surgery

## 2017-10-09 ENCOUNTER — Encounter: Payer: Self-pay | Admitting: Cardiothoracic Surgery

## 2017-10-09 VITALS — BP 146/89 | HR 86 | Resp 16 | Ht 66.0 in | Wt 230.0 lb

## 2017-10-09 DIAGNOSIS — I712 Thoracic aortic aneurysm, without rupture: Secondary | ICD-10-CM | POA: Diagnosis not present

## 2017-10-09 DIAGNOSIS — I7121 Aneurysm of the ascending aorta, without rupture: Secondary | ICD-10-CM

## 2017-10-09 NOTE — Progress Notes (Signed)
PCP is Marin Olp, MD Referring Provider is Lacretia Leigh, MD  Chief Complaint  Patient presents with  . Thoracic Aortic Aneurysm    f/u after ECHO 07/17/2017    HPI: Patient returns for scheduled office visit after undergoing 2D echocardiogram for the presence of a heart murmur. 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  She also had a systolic murmur so echocardiogram was ordered.  This shows normal biventricular function, no significant valvular heart disease.  Patient has functional flow murmur.  CT scan also showed an incidental finding of a 5 mm right lower lobe superior segment nodule which will be followed as well.  Her annual CT scan will be scheduled for May 2020 Patient denies chest pain today.  Patient understands that the best treatment of her thoracic aortic disease is prevention of further dilatation meaning good blood pressure control.  She has brought several blood pressure measurements which are taken before and after each IV therapy for her multiple sclerosis.  They are all acceptable with a range of 120-140 mmHg.  She will continue her Norvasc and carvedilol.  Past Medical History:  Diagnosis Date  . Arthritis    mild  . Depression   . Diabetes mellitus    Type 2  . Endometriosis   . History of kidney stones   . Hypertension   . Multiple sclerosis ([place])   . Type II or unspecified type diabetes mellitus with peripheral circulatory disorders, uncontrolled(250.72) 11/27/2012    Past Surgical History:  Procedure Laterality Date  . EXPLORATORY LAPAROTOMY     endometriosis  . EXTRACORPOREAL SHOCK WAVE LITHOTRIPSY Right 11/19/2016   Procedure: RIGHT EXTRACORPOREAL SHOCK WAVE LITHOTRIPSY (ESWL);  Surgeon: Festus Aloe, MD;  Location: [facility];  Service: Urology;  Laterality: Right;  . LUMBAR DISC SURGERY      LS spine 2005 or so  . WISDOM TOOTH EXTRACTION      Family History  Problem Relation Age of Onset  . Breast cancer Mother        early 17s  . Diabetes Mother        maternal grandmother as well  . Diabetes Father   . Other Father        progressive supranuclear palsy    Social History Social History   Tobacco Use  . Smoking status: Never Smoker  . Smokeless tobacco: Never Used  Substance Use Topics  . Alcohol use: Yes    Alcohol/week: 0.0 standard drinks    Comment: occasional  . Drug use: No    Current Outpatient Medications  Medication Sig Dispense Refill  . amLODipine (NORVASC) 10 MG tablet TAKE 1 TABLET DAILY 90 tablet 3  . carvedilol (COREG) 6.25 MG tablet Take 1 tablet (6.25 mg total) by mouth 2 (two) times daily. 180 tablet 1  . cetirizine (ZYRTEC) 10 MG tablet Take 10 mg by mouth every evening.     . DULoxetine (CYMBALTA) 60 MG capsule TAKE 1 CAPSULE DAILY 90 capsule 1  . fluticasone (FLONASE) 50 MCG/ACT nasal spray Place 2 sprays into both nostrils daily.    Marland Kitchen glipiZIDE (GLUCOTROL XL) 10 MG 24 hr tablet TAKE 1 TABLET DAILY WITH BREAKFAST 90 tablet 1  . glucose blood (FREESTYLE TEST STRIPS) test strip TEST BLOOD SUGAR TWICE A DAY. VARY TIMES AS DIRECTED. 200 each 3  . Insulin Disposable Pump (  V-GO 40) KIT Use 1x a day 90 kit 3  . insulin lispro (HUMALOG) 100 UNIT/ML injection USE WITH VGO 40 TO INJECT 76 UNITS UNDER THE SKIN ONCE AS DIRECTED 70 mL 0  . levonorgestrel-ethinyl estradiol (LYBREL,AMETHYST) 90-20 MCG tablet Take 1 tablet by mouth daily.     Marland Kitchen liraglutide (VICTOZA) 18 MG/3ML SOPN Use 1.8 mg under skin (Patient taking differently: Inject 1.5 mg into the skin daily. ) 9 pen 3  . metFORMIN (GLUCOPHAGE-XR) 500 MG 24 hr tablet TAKE 2 TABLETS (1,000 MG TOTAL) TWICE A DAY WITH A MEAL 360 tablet 4  . modafinil (PROVIGIL) 200 MG tablet Take 1 tablet (200 mg total) by mouth daily as needed. 90 tablet 1  . Naproxen Sodium 220 MG CAPS Take 440 mg by mouth 2  (two) times daily as needed (for pain.).     Marland Kitchen natalizumab (TYSABRI) 300 MG/15ML injection Inject 300 mg into the vein every 30 (thirty) days.    . pravastatin (PRAVACHOL) 10 MG tablet Take 1 tablet (10 mg total) by mouth daily. 30 tablet 5   No current facility-administered medications for this visit.     Allergies  Allergen Reactions  . Ace Inhibitors Swelling    Edema    Review of Systems  No chest pain edema presyncope Weight stable  BP (!) 146/89 (BP Location: Right Arm, Patient Position: Sitting, Cuff Size: Large)   Pulse 86   Resp 16   Ht '5\' 6"'  (1.676 m)   Wt 230 lb (104.3 kg)   SpO2 98% Comment: ON RA  BMI 37.12 kg/m  Physical Exam      Exam    General- alert and comfortable    Neck- no JVD, no cervical adenopathy palpable, no carotid bruit   Lungs- clear without rales, wheezes   Cor- regular rate and rhythm, soft systolic murmur , no gallop   Abdomen- soft, non-tender   Extremities - warm, non-tender, minimal edema   Neuro- oriented, appropriate, no focal weakness   Diagnostic Tests: Echocardiogram images and report personally reviewed and discussed with patient.  She has normal LV function.  No disease with aortic valve mitral valve or tricuspid valve.  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  Patient compliant with blood pressure medications. Soft functional cardiac flow murmur with normal cardiac valve and ventricular function   Plan:  Patient understands that Levaquin and fluoroquinolones in  general should be avoided in patients with dilated aorta as the antibiotic group has a detrimental effect on the connective tissue in the aortic wall [also the same detrimental effect on joint connective tissue] Return May 2020 for CT scan of chest to review ascending aorta and 5 mm nodule superior segment right lower lobe Len Childs, MD [facility] [phone]

## 2017-10-14 ENCOUNTER — Telehealth: Payer: Self-pay | Admitting: *Deleted

## 2017-10-14 NOTE — Telephone Encounter (Signed)
[facility]. She has not seen Dr. Felecia Shelling since April 2018.  She needs to see him before she has another Tysabri infusion. Please call for an appt/fim

## 2017-10-15 ENCOUNTER — Encounter: Payer: Self-pay | Admitting: *Deleted

## 2017-10-16 ENCOUNTER — Encounter: Payer: Self-pay | Admitting: *Deleted

## 2017-10-31 DIAGNOSIS — Z1212 Encounter for screening for malignant neoplasm of rectum: Secondary | ICD-10-CM | POA: Diagnosis not present

## 2017-10-31 DIAGNOSIS — Z6836 Body mass index (BMI) 36.0-36.9, adult: Secondary | ICD-10-CM | POA: Diagnosis not present

## 2017-10-31 DIAGNOSIS — Z01419 Encounter for gynecological examination (general) (routine) without abnormal findings: Secondary | ICD-10-CM | POA: Diagnosis not present

## 2017-10-31 LAB — HM PAP SMEAR: HM Pap smear: NEGATIVE

## 2017-11-02 ENCOUNTER — Other Ambulatory Visit: Payer: Self-pay | Admitting: Internal Medicine

## 2017-11-12 DIAGNOSIS — G35 Multiple sclerosis: Secondary | ICD-10-CM | POA: Diagnosis not present

## 2017-12-10 ENCOUNTER — Telehealth: Payer: Self-pay | Admitting: *Deleted

## 2017-12-10 DIAGNOSIS — G35 Multiple sclerosis: Secondary | ICD-10-CM | POA: Diagnosis not present

## 2017-12-10 NOTE — Telephone Encounter (Signed)
Pt in office today for infusion. Last seen 03/2016. JCV drawn on 09/2017 indeterminate titer: 0.22, inhibition assay: negative. I advised pt of this. Also advised pt that she will need to f/u every 6 months moving forward. She is going to speak with Dr. Felecia Shelling about doing yearly f/u at appt scheduled for 12/31/17 at 1:30pm.

## 2017-12-19 ENCOUNTER — Other Ambulatory Visit: Payer: Self-pay | Admitting: Internal Medicine

## 2017-12-25 DIAGNOSIS — Z1231 Encounter for screening mammogram for malignant neoplasm of breast: Secondary | ICD-10-CM | POA: Diagnosis not present

## 2017-12-31 ENCOUNTER — Encounter: Payer: Self-pay | Admitting: Neurology

## 2017-12-31 ENCOUNTER — Ambulatory Visit: Payer: BLUE CROSS/BLUE SHIELD | Admitting: Neurology

## 2017-12-31 VITALS — BP 155/90 | HR 92 | Resp 16 | Ht 66.0 in | Wt 240.0 lb

## 2017-12-31 DIAGNOSIS — D899 Disorder involving the immune mechanism, unspecified: Secondary | ICD-10-CM

## 2017-12-31 DIAGNOSIS — D849 Immunodeficiency, unspecified: Secondary | ICD-10-CM

## 2017-12-31 DIAGNOSIS — G4710 Hypersomnia, unspecified: Secondary | ICD-10-CM

## 2017-12-31 DIAGNOSIS — R269 Unspecified abnormalities of gait and mobility: Secondary | ICD-10-CM | POA: Diagnosis not present

## 2017-12-31 DIAGNOSIS — Z79899 Other long term (current) drug therapy: Secondary | ICD-10-CM

## 2017-12-31 DIAGNOSIS — G35 Multiple sclerosis: Secondary | ICD-10-CM

## 2017-12-31 NOTE — Progress Notes (Signed)
[facility]  PATIENT: Kristin Mathews DOB: [birth year]  REFERRING CLINICIAN: Dr. Leanne Chang  HISTORY FROM: Patient REASON FOR VISIT: MS   HISTORICAL  CHIEF COMPLAINT:  Chief Complaint  Patient presents with  . Multiple Sclerosis    Rm. 12.  Sts. she continues to tolerate Tysabri well.  JCV ab last checked 10/08/17 was Indeterminate.  Assay was Negative.  She has not had MRI Brain done due to cost./fim    HISTORY OF PRESENT ILLNESS:  Kristin Mathews is a 47 y.o. woman with MS dx 2001.  Update 12/31/2017: She has been on Tysabri for about 5 years.  She tolerated well and has not had any exacerbations.   Her next Tysabri infusion will be 01/12/2018.  She is likely going to be out of the country for 4 to 6 months starting in April for work.  We had a conversation about her MS disease modifying therapy options.  She has mild spasticity in her limbs if sitting/laying a long time and getting up.  No difficulty with gait though balance is slightly off on stairs and she uses the bannister.   Strength and sensation are fine.   Bladder is fine.  Vision is fine.   She notes fatigue (often work related).     Mood is usually ok.   Cognition is fine.     From 04/17/2016: She presented with unilateral numbness in May 2001 and was diagnosed with transverse myelitis.    MRI only showed one spot and an LP was non-diagnostic.   She had another episode of numbness 6 months later and MRI showed several newer lesions.    She was placed on REbif as part of the Rebiject study.     She had an exacerbation in 2007 and in 2010 with numbness and then optic neuritis.   In 2015, she had another numbness exacerbation and we decided to switch to Tysabri.   She has had 3 doses, last dose was 12/1/8/205.    Insurance Nurse, mental health) will not cover Tysabri in a hospital outpt center and we are trying to find out how best to continue her infusions.     She tolerates Tysabri well.  Gait/strength/sensation   She notes mild  spasticity but no weakness.   Gait is fine but she feels off balanced if she stands on a chair or ladder.   She fell and hit her head once while changing a smoke alarm battery.  Her tingling is mostly in the toes and bottoms of her feet.    Bladder:    She has bowel frequency and rare incontinence, especially if sugars are high.   Rare bowel incontinence, also if sugars very high  Vision:   She notes mild visual blurring.   She has been told in the past that her allergies are affecting her vision.   Blurriness improves after she is up a while.   She is going to see ophtho.     Fatigue is often a problem.   Thi is physical > mental.    She has poor sleep with a lot of nocturnal awakenings.    Usually she falls back asleep when she wakes up.    She snores.   No reports of apnea.  She is sleepy during the day, helped byu Provigil  EPWORTH SLEEPINESS SCALE  On a scale of 0 - 3 what is the chance of dozing:  Sitting and Reading:   3 Watching TV:    3 Sitting inactive  in a public place: 0 Passenger in car for one hour: 3 Lying down to rest in the afternoon: 3 Sitting and talking to someone: 0 Sitting quietly after lunch:  3 In a car, stopped in traffic:  0  Total (out of 24):    15/24 (moderate sleepiness)  Mood:   She denies any difficulty with depression or anxiety.   No cognitive issues   REVIEW OF SYSTEMS:  Constitutional: No fevers, chills, sweats, or change in appetite.   Has fatigue.  Has Poor sleep Eyes: No visual changes, double vision, eye pain Ear, nose and throat: No hearing loss, ear pain, nasal congestion, sore throat Cardiovascular: No chest pain, palpitations Respiratory:  No shortness of breath at rest or with exertion.   No wheezes.   Snoring GastrointestinaI: No nausea, vomiting, diarrhea, abdominal pain.   Rare fecal incontinence Genitourinary:    She reports urgency with rare incontinence Musculoskeletal:  No neck pain, back pain Integumentary: No rash, pruritus,  skin lesions Neurological: as above Psychiatric: No depression at this time.  No anxiety Endocrine: No palpitations, diaphoresis, change in appetite, change in weigh or increased thirst Hematologic/Lymphatic:  No anemia, purpura, petechiae. Allergic/Immunologic: No itchy/runny eyes, nasal congestion, recent allergic reactions, rashes  ALLERGIES: Allergies  Allergen Reactions  . Ace Inhibitors Swelling    Edema    HOME MEDICATIONS: Outpatient Medications Prior to Visit  Medication Sig Dispense Refill  . amLODipine (NORVASC) 10 MG tablet TAKE 1 TABLET DAILY 90 tablet 3  . carvedilol (COREG) 6.25 MG tablet Take 1 tablet (6.25 mg total) by mouth 2 (two) times daily. 180 tablet 1  . cetirizine (ZYRTEC) 10 MG tablet Take 10 mg by mouth every evening.     . DULoxetine (CYMBALTA) 60 MG capsule TAKE 1 CAPSULE DAILY 90 capsule 1  . fluticasone (FLONASE) 50 MCG/ACT nasal spray Place 2 sprays into both nostrils daily.    Marland Kitchen glipiZIDE (GLUCOTROL XL) 10 MG 24 hr tablet TAKE 1 TABLET DAILY WITH BREAKFAST 90 tablet 4  . glucose blood (FREESTYLE TEST STRIPS) test strip TEST BLOOD SUGAR TWICE A DAY. VARY TIMES AS DIRECTED. 200 each 3  . Insulin Disposable Pump (V-GO 40) KIT Use 1x a day 90 kit 3  . insulin lispro (HUMALOG) 100 UNIT/ML injection INJECT 76 UNITS UNDER THE SKIN ONCE AS DIRECTED WITH VGO 40 70 mL 3  . levonorgestrel-ethinyl estradiol (LYBREL,AMETHYST) 90-20 MCG tablet Take 1 tablet by mouth daily.     Marland Kitchen liraglutide (VICTOZA) 18 MG/3ML SOPN Use 1.8 mg under skin (Patient taking differently: Inject 1.5 mg into the skin daily. ) 9 pen 3  . metFORMIN (GLUCOPHAGE-XR) 500 MG 24 hr tablet TAKE 2 TABLETS (1,000 MG TOTAL) TWICE A DAY WITH A MEAL 360 tablet 4  . modafinil (PROVIGIL) 200 MG tablet Take 1 tablet (200 mg total) by mouth daily as needed. 90 tablet 1  . Naproxen Sodium 220 MG CAPS Take 440 mg by mouth 2 (two) times daily as needed (for pain.).     Marland Kitchen natalizumab (TYSABRI) 300 MG/15ML  injection Inject 300 mg into the vein every 30 (thirty) days.    . pravastatin (PRAVACHOL) 10 MG tablet Take 1 tablet (10 mg total) by mouth daily. (Patient not taking: Reported on 12/31/2017) 30 tablet 5   No facility-administered medications prior to visit.     PAST MEDICAL HISTORY: Past Medical History:  Diagnosis Date  . Arthritis    mild  . Depression   . Diabetes mellitus  Type 2  . Endometriosis   . History of kidney stones   . Hypertension   . Multiple sclerosis ([place])   . Type II or unspecified type diabetes mellitus with peripheral circulatory disorders, uncontrolled(250.72) 11/27/2012    PAST SURGICAL HISTORY: Past Surgical History:  Procedure Laterality Date  . EXPLORATORY LAPAROTOMY     endometriosis  . EXTRACORPOREAL SHOCK WAVE LITHOTRIPSY Right 11/19/2016   Procedure: RIGHT EXTRACORPOREAL SHOCK WAVE LITHOTRIPSY (ESWL);  Surgeon: Festus Aloe, MD;  Location: [facility];  Service: Urology;  Laterality: Right;  . LUMBAR DISC SURGERY     LS spine 2005 or so  . WISDOM TOOTH EXTRACTION      FAMILY HISTORY: Family History  Problem Relation Age of Onset  . Breast cancer Mother        early 50s  . Diabetes Mother        maternal grandmother as well  . Diabetes Father   . Other Father        progressive supranuclear palsy    SOCIAL HISTORY:  Social History   Socioeconomic History  . Marital status: Single    Spouse name: Not on file  . Number of children: Not on file  . Years of education: Not on file  . Highest education level: Not on file  Occupational History  . Not on file  Social Needs  . Financial resource strain: Not on file  . Food insecurity:    Worry: Not on file    Inability: Not on file  . Transportation needs:    Medical: Not on file    Non-medical: Not on file  Tobacco Use  . Smoking status: Never Smoker  . Smokeless tobacco: Never Used  Substance and Sexual Activity  . Alcohol use: Yes    Alcohol/week: 0.0 standard drinks     Comment: occasional  . Drug use: No  . Sexual activity: Not on file  Lifestyle  . Physical activity:    Days per week: Not on file    Minutes per session: Not on file  . Stress: Not on file  Relationships  . Social connections:    Talks on phone: Not on file    Gets together: Not on file    Attends religious service: Not on file    Active member of club or organization: Not on file    Attends meetings of clubs or organizations: Not on file    Relationship status: Not on file  . Intimate partner violence:    Fear of current or ex partner: Not on file    Emotionally abused: Not on file    Physically abused: Not on file    Forced sexual activity: Not on file  Other Topics Concern  . Not on file  Social History Narrative   Single. Lives alone. No children. Pet cat.       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   General: The patient is well-developed and well-nourished and in no acute distress   Neurologic Exam  Mental status: The patient is alert and oriented x 3 at  the time of the examination. The patient has apparent normal recent and remote memory, with an apparently normal attention span and concentration ability.   Speech is normal.  Cranial nerves: Extraocular movements are full. Pupils are equal, round, and reactive to light and accomodation.  Visual fields are full.  Facial symmetry is present. There is mild reduced right facial sensation to soft touch.  Facial strength is normal.  Trapezius and sternocleidomastoid strength is normal. No dysarthria is noted.  The tongue is midline, and the patient has symmetric elevation of the soft palate. No obvious hearing deficits  are noted.  Motor:  Muscle bulk and tone are normal. Strength is  5 / 5 in all 4 extremities.   Sensory: Sensory testing is intact to pinprick, soft touch, vibration sensation, and position sense in her hands but she reports decreased vibration sensation in both feet, predominantly at the toes   Coordination: Cerebellar testing reveals Good finger-to-nose and heel-to-shin bilaterally.  Gait and station: Station and gait are normal. Tandem gait is wide. Romberg is negative.   Reflexes: Deep tendon reflexes are symmetric and normal bilaterally.        DIAGNOSTIC DATA (LABS, IMAGING, TESTING) - I reviewed patient records, labs, notes, testing and imaging myself where available.  Lab Results  Component Value Date   WBC 11.3 (H) 08/21/2017   HGB 13.7 08/21/2017   HCT 40.2 08/21/2017   MCV 85.2 08/21/2017   PLT 315.0 08/21/2017      Component Value Date/Time   NA 136 08/21/2017 0833   NA 139 01/10/2017   K 4.1 08/21/2017 0833   CL 101 08/21/2017 0833   CO2 27 08/21/2017 0833   GLUCOSE 273 (H) 08/21/2017 0833   BUN 13 08/21/2017 0833   BUN 7 01/10/2017   CREATININE 0.88 08/21/2017 0833   CREATININE 0.73 04/30/2014 1109   CALCIUM 9.0 08/21/2017 0833   PROT 6.5 08/21/2017 0833   PROT 6.7 10/12/2014 1331   ALBUMIN 3.7 08/21/2017 0833   ALBUMIN 4.1 10/12/2014 1331   AST 18 08/21/2017 0833   ALT 27 08/21/2017 0833   ALKPHOS 73 08/21/2017 0833   BILITOT 0.4 08/21/2017 0833   BILITOT 0.3 10/12/2014 1331   GFRNONAA >60 05/29/2017 1948   GFRNONAA >89 04/30/2014 1109   GFRAA >60 05/29/2017 1948   GFRAA >89 04/30/2014 1109   Lab Results  Component Value Date   CHOL 179 08/21/2017   HDL 37.90 (L) 08/21/2017   LDLCALC 105 (H) 08/21/2016   LDLDIRECT 125.0 08/21/2017   TRIG 220.0 (H) 08/21/2017   CHOLHDL 5 08/21/2017   Lab Results  Component Value Date   HGBA1C 7.9 (A) 10/07/2017   No results found for: VITAMINB12 Lab Results  Component Value Date   TSH 2.25 04/30/2014    No components found for: VITAMIND     ASSESSMENT AND PLAN  Multiple sclerosis ([place]) - Plan: Hepatitis B core antibody, total, Hepatitis B surface antigen, HIV Antibody (routine testing w rflx), QuantiFERON-TB Gold Plus, Hepatitis B surface antibody,qualitative, CBC with Differential/Platelet, MR BRAIN W WO CONTRAST, Comprehensive metabolic panel  High risk medication use - Plan: Hepatitis B core antibody, total, Hepatitis B surface antigen, HIV Antibody (routine testing w rflx), QuantiFERON-TB Gold Plus, Hepatitis B surface antibody,qualitative, CBC with Differential/Platelet, Comprehensive metabolic panel  Gait disturbance  Immunosuppressed status ([facility])  Excessive sleepiness   1.   We discussed switching from Tysabri to [place] at that would work better while she is out of the country.   2.  We will check an MRI of the brain with and without contrast to determine if there is been any subclinical progression and to make sure no evidence of PML.  3.   Stay active and exercise as tolerated.  4.    She will return to see me in 5-6 months if stable or sooner if she is found to have obstructive sleep apnea or if she notes other new or worsening symptoms.  Bryce Kimble A. Felecia Shelling, MD, PhD 00/37/9444, 6:19 PM Certified in Neurology, Clinical Neurophysiology, Sleep Medicine, Pain Medicine and Neuroimaging  [facility] [address], [place], [place] [address] [phone]

## 2018-01-01 ENCOUNTER — Telehealth: Payer: Self-pay | Admitting: Neurology

## 2018-01-01 NOTE — Telephone Encounter (Signed)
[employer] Auth: [id number] (exp. 01/01/18 to 01/30/18) lvm for pt to be aware. I gave her GI phone number of [phone] and to give them a call if she has not heard from them in the next 2-3 business days.

## 2018-01-06 LAB — COMPREHENSIVE METABOLIC PANEL
A/G RATIO: 1.8 (ref 1.2–2.2)
ALT: 17 IU/L (ref 0–32)
AST: 15 IU/L (ref 0–40)
Albumin: 4.2 g/dL (ref 3.5–5.5)
Alkaline Phosphatase: 91 IU/L (ref 39–117)
BUN/Creatinine Ratio: 16 (ref 9–23)
BUN: 14 mg/dL (ref 6–24)
Bilirubin Total: 0.2 mg/dL (ref 0.0–1.2)
CO2: 22 mmol/L (ref 20–29)
Calcium: 9.3 mg/dL (ref 8.7–10.2)
Chloride: 100 mmol/L (ref 96–106)
Creatinine, Ser: 0.88 mg/dL (ref 0.57–1.00)
GFR calc Af Amer: 90 mL/min/{1.73_m2} (ref >59)
GFR calc non Af Amer: 78 mL/min/{1.73_m2} (ref >59)
Globulin, Total: 2.4 g/dL (ref 1.5–4.5)
Glucose: 188 mg/dL — ABNORMAL HIGH (ref 65–99)
POTASSIUM: 4.8 mmol/L (ref 3.5–5.2)
Sodium: 138 mmol/L (ref 134–144)
Total Protein: 6.6 g/dL (ref 6.0–8.5)

## 2018-01-06 LAB — CBC WITH DIFFERENTIAL/PLATELET
BASOS ABS: 0.1 10*3/uL (ref 0.0–0.2)
Basos: 1 %
EOS (ABSOLUTE): 0.8 10*3/uL — ABNORMAL HIGH (ref 0.0–0.4)
Eos: 6 %
Hematocrit: 43.8 % (ref 34.0–46.6)
Hemoglobin: 14.2 g/dL (ref 11.1–15.9)
Immature Grans (Abs): 0.1 10*3/uL (ref 0.0–0.1)
Immature Granulocytes: 1 %
LYMPHS: 42 %
Lymphocytes Absolute: 6 10*3/uL — ABNORMAL HIGH (ref 0.7–3.1)
MCH: 28.2 pg (ref 26.6–33.0)
MCHC: 32.4 g/dL (ref 31.5–35.7)
MCV: 87 fL (ref 79–97)
Monocytes Absolute: 0.6 10*3/uL (ref 0.1–0.9)
Monocytes: 4 %
Neutrophils Absolute: 6.8 10*3/uL (ref 1.4–7.0)
Neutrophils: 46 %
Platelets: 408 10*3/uL (ref 150–450)
RBC: 5.03 x10E6/uL (ref 3.77–5.28)
RDW: 13.9 % (ref 12.3–15.4)
WBC: 14.4 10*3/uL — ABNORMAL HIGH (ref 3.4–10.8)

## 2018-01-06 LAB — QUANTIFERON-TB GOLD PLUS
QUANTIFERON TB2 AG VALUE: 0.05 [IU]/mL
QUANTIFERON-TB GOLD PLUS: NEGATIVE
QuantiFERON Nil Value: 0.05 IU/mL
QuantiFERON TB1 Ag Value: 0.06 IU/mL

## 2018-01-06 LAB — HEPATITIS B SURFACE ANTIBODY,QUALITATIVE: Hep B Surface Ab, Qual: NONREACTIVE

## 2018-01-06 LAB — HIV ANTIBODY (ROUTINE TESTING W REFLEX): HIV SCREEN 4TH GENERATION: NONREACTIVE

## 2018-01-06 LAB — HEPATITIS B CORE ANTIBODY, TOTAL: HEP B C TOTAL AB: NEGATIVE

## 2018-01-06 LAB — HEPATITIS B SURFACE ANTIGEN: HEP B S AG: NEGATIVE

## 2018-01-09 ENCOUNTER — Telehealth: Payer: Self-pay | Admitting: *Deleted

## 2018-01-09 NOTE — Telephone Encounter (Signed)
Gave completed/signed Ocrevus start form to intrafusion. Dr. Felecia Shelling requesting pt receive initial dose (parts A and B--300mg  IV on day 1 and repeat on day 15) first week of March. Pt will then be going out of the country for 4-5 months and will be back in time to receive subsequent dose of 600mg  IV every 6 months.

## 2018-01-13 DIAGNOSIS — G35 Multiple sclerosis: Secondary | ICD-10-CM | POA: Diagnosis not present

## 2018-01-20 ENCOUNTER — Ambulatory Visit
Admission: RE | Admit: 2018-01-20 | Discharge: 2018-01-20 | Disposition: A | Discharge status: Home / Self Care | Payer: BLUE CROSS/BLUE SHIELD | Source: Ambulatory Visit | Attending: Neurology | Admitting: Neurology

## 2018-01-20 DIAGNOSIS — G35 Multiple sclerosis: Secondary | ICD-10-CM

## 2018-01-20 MED ORDER — GADOBENATE DIMEGLUMINE 529 MG/ML IV SOLN
20.0000 mL | Freq: Once | INTRAVENOUS | Status: AC | PRN
  Administered 2018-01-20: 20 mL via INTRAVENOUS

## 2018-01-21 ENCOUNTER — Telehealth: Payer: Self-pay | Admitting: *Deleted

## 2018-01-21 ENCOUNTER — Ambulatory Visit: Payer: BLUE CROSS/BLUE SHIELD | Admitting: Internal Medicine

## 2018-01-21 NOTE — Telephone Encounter (Signed)
LMOM with below MRI results. She does not need to return this call unless she has questions/fim

## 2018-01-21 NOTE — Telephone Encounter (Signed)
-----   Message from Britt Bottom, MD sent at 01/21/2018  9:13 AM EST ----- Please let her know that the MRI of the brain looks good.  There are no new lesions.  She only has about half dozen MS lesions in the brain and no atrophy which is good

## 2018-02-11 ENCOUNTER — Telehealth: Payer: Self-pay | Admitting: Neurology

## 2018-02-11 DIAGNOSIS — G35 Multiple sclerosis: Secondary | ICD-10-CM | POA: Diagnosis not present

## 2018-02-11 NOTE — Telephone Encounter (Signed)
pt has called for the intrafusion suite, call transferred °

## 2018-02-13 DIAGNOSIS — R351 Nocturia: Secondary | ICD-10-CM | POA: Diagnosis not present

## 2018-02-13 DIAGNOSIS — R609 Edema, unspecified: Secondary | ICD-10-CM | POA: Diagnosis not present

## 2018-02-15 ENCOUNTER — Other Ambulatory Visit: Payer: Self-pay | Admitting: Family Medicine

## 2018-02-17 ENCOUNTER — Ambulatory Visit: Payer: BLUE CROSS/BLUE SHIELD | Admitting: Family Medicine

## 2018-02-17 ENCOUNTER — Encounter: Payer: Self-pay | Admitting: Family Medicine

## 2018-02-17 VITALS — BP 120/88 | HR 82 | Temp 98.0°F | Ht 66.0 in | Wt 240.2 lb

## 2018-02-17 DIAGNOSIS — F324 Major depressive disorder, single episode, in partial remission: Secondary | ICD-10-CM

## 2018-02-17 DIAGNOSIS — E1151 Type 2 diabetes mellitus with diabetic peripheral angiopathy without gangrene: Secondary | ICD-10-CM | POA: Diagnosis not present

## 2018-02-17 DIAGNOSIS — E1165 Type 2 diabetes mellitus with hyperglycemia: Secondary | ICD-10-CM

## 2018-02-17 DIAGNOSIS — IMO0002 Reserved for concepts with insufficient information to code with codable children: Secondary | ICD-10-CM

## 2018-02-17 DIAGNOSIS — R609 Edema, unspecified: Secondary | ICD-10-CM | POA: Diagnosis not present

## 2018-02-17 DIAGNOSIS — I10 Essential (primary) hypertension: Secondary | ICD-10-CM | POA: Diagnosis not present

## 2018-02-17 MED ORDER — HYDROCHLOROTHIAZIDE 12.5 MG PO CAPS: 12.5000 mg | ORAL_CAPSULE | Freq: Every day | ORAL | 3 refills | Status: DC

## 2018-02-17 NOTE — Patient Instructions (Addendum)
Health Maintenance Due  Topic Date Due  . PAP SMEAR-Modifier -will call [facility] and get report faxed over 11/04/2016  . OPHTHALMOLOGY EXAM -will call and schedule 06/15/2017  . URINE MICROALBUMIN -will do at next visit 08/21/2017     Cut amlodipine in half to 5 mg Start HCTZ 12.5mg  daily Lets recheck blood pressure and potassium in 2-4 weeks.

## 2018-02-17 NOTE — Progress Notes (Signed)
Phone [phone]   Subjective:  Kristin Mathews is a 48 y.o. year old very pleasant female patient who presents for/with See problem oriented charting ROS- complains of edema.  No reported chest pain or shortness of breath.  No headache or blurry vision reported  Past Medical History-  Patient Active Problem List   Diagnosis Date Noted  . Aortic aneurysm (HCC) 08/21/2017    Priority: High  . Pulmonary nodule 08/21/2017    Priority: High  . Immunosuppressed status (HCC) 03/05/2017    Priority: High  . Uncontrolled type 2 diabetes mellitus with peripheral circulatory disorder (HCC) 11/27/2012    Priority: High  . Multiple sclerosis (HCC) 07/05/2006    Priority: High  . Nephrolithiasis 08/21/2016    Priority: Medium  . Depression 02/25/2008    Priority: Medium  . Hyperlipidemia 08/29/2006    Priority: Medium  . Essential hypertension 07/05/2006    Priority: Medium  . Gait disturbance 04/17/2016    Priority: Low  . Tingling 04/17/2016    Priority: Low  . Snoring 04/17/2016    Priority: Low  . Urinary urgency 04/17/2016    Priority: Low  . Excessive sleepiness 04/17/2016    Priority: Low  . Allergic rhinitis 04/27/2014    Priority: Low  . Low back pain 04/27/2014    Priority: Low  . Endometriosis 07/05/2006    Priority: Low  . Class II obesity 10/09/2016    Medications- reviewed and updated Current Outpatient Medications  Medication Sig Dispense Refill  . amLODipine (NORVASC) 10 MG tablet TAKE 1 TABLET DAILY 90 tablet 3  . carvedilol (COREG) 6.25 MG tablet Take 1 tablet (6.25 mg total) by mouth 2 (two) times daily. 180 tablet 1  . cetirizine (ZYRTEC) 10 MG tablet Take 10 mg by mouth every evening.     . DULoxetine (CYMBALTA) 60 MG capsule TAKE 1 CAPSULE DAILY 90 capsule 4  . fluticasone (FLONASE) 50 MCG/ACT nasal spray Place 2 sprays into both nostrils daily.    . glipiZIDE (GLUCOTROL XL) 10 MG 24 hr tablet TAKE 1 TABLET DAILY WITH BREAKFAST 90 tablet 4  . glucose  blood (FREESTYLE TEST STRIPS) test strip TEST BLOOD SUGAR TWICE A DAY. VARY TIMES AS DIRECTED. 200 each 3  . Insulin Disposable Pump (V-GO 40) KIT Use 1x a day 90 kit 3  . insulin lispro (HUMALOG) 100 UNIT/ML injection INJECT 76 UNITS UNDER THE SKIN ONCE AS DIRECTED WITH VGO 40 70 mL 3  . levonorgestrel-ethinyl estradiol (LYBREL,AMETHYST) 90-20 MCG tablet Take 1 tablet by mouth daily.     . liraglutide (VICTOZA) 18 MG/3ML SOPN Use 1.8 mg under skin (Patient taking differently: Inject 1.5 mg into the skin daily. ) 9 pen 3  . metFORMIN (GLUCOPHAGE-XR) 500 MG 24 hr tablet TAKE 2 TABLETS (1,000 MG TOTAL) TWICE A DAY WITH A MEAL 360 tablet 4  . modafinil (PROVIGIL) 200 MG tablet Take 1 tablet (200 mg total) by mouth daily as needed. 90 tablet 1  . Naproxen Sodium 220 MG CAPS Take 440 mg by mouth 2 (two) times daily as needed (for pain.).     . natalizumab (TYSABRI) 300 MG/15ML injection Inject 300 mg into the vein every 30 (thirty) days.    . hydrochlorothiazide (MICROZIDE) 12.5 MG capsule Take 1 capsule (12.5 mg total) by mouth daily. 30 capsule 3   No current facility-administered medications for this visit.      Objective:  BP 120/88 (BP Location: Right Arm, Patient Position: Sitting, Cuff Size: Large)     Pulse 82   Temp 98 F (36.7 C) (Oral)   Ht 5' 6" (1.676 m)   Wt 240 lb 3.2 oz (109 kg)   SpO2 96%   BMI 38.77 kg/m  Gen: NAD, resting comfortably CV: RRR no murmurs rubs or gallops Lungs: CTAB no crackles, wheeze, rhonchi Ext: trace edema Skin: warm, dry    Assessment and Plan   Essential hypertension Edema  s: went to urgent care last week- thought Had UTI  but on culture it was negative. On a business trip- feet and legs swole during the trip bilaterally- also had some itching. Noted foul smell to urine, frequency and urgency. Urinary symptoms have now largely resolved. Watched salt intake and swelling has gone down.   She was told needed to come to PCP to have kidneys checked.     2 doctor appointments in last week- at Urgent care 150s/90s on Thursday and then at infusion appointment was 140/90.   Reports high potassium on hctz in the past. Possibly on losartan combo pill at the time.  Upcoming travels to [place] for 3-6 months in may.  A/P: Patient would like to try to address the intermittent swelling issues before travel.  We discussed amlodipine could increase risk.  We will trial the following From AVS "  Cut amlodipine in half to 5 mg Start HCTZ 12.5mg daily Lets recheck blood pressure and potassium in 2-4 weeks.  "  Depression S: Patient is compliant with Cymbalta-originally had been prescribed by Dr. Sater.  Patient is states a lot of her depression is related to work stressors-.doing ok at the moment as project postponed at work. Still considering counseling-she states she simply has not had time yet A/P: This appears to be improved-we will plan on getting a PHQ 9 when she comes in for blood pressure repeat  Diabetes-last A1c was elevated at 7.9.  She follows with endocrine Dr. Gherghe-has a visit in April before she leaves on her trip to [place].  We had planned to get a urine microalbumin today but the patient would prefer to get this at follow-up when we get blood work Future Appointments  Date Time Provider Department Center  03/13/2018  1:20 PM ,  O, MD [facility]  04/22/2018  4:00 PM Gherghe, Cristina, MD [facility] None  10/02/2018  1:00 PM Sater, Richard A, MD [facility] None  2 to 4-week blood pressure follow-up  Lab/Order associations: Essential hypertension  Major depressive disorder with single episode, in partial remission (HCC)  Edema, unspecified type  Uncontrolled type 2 diabetes mellitus with peripheral circulatory disorder (HCC)  Meds ordered this encounter  Medications  . hydrochlorothiazide (MICROZIDE) 12.5 MG capsule    Sig: Take 1 capsule (12.5 mg total) by mouth daily.    Dispense:  30 capsule    Refill:  3     Return precautions advised.   , MD  

## 2018-02-19 NOTE — Assessment & Plan Note (Signed)
S: Patient is compliant with Cymbalta-originally had been prescribed by Dr. Felecia Shelling.  Patient is states a lot of her depression is related to work stressors-.doing ok at the moment as project postponed at work. Still considering counseling-she states she simply has not had time yet A/P: This appears to be improved-we will plan on getting a PHQ 9 when she comes in for blood pressure repeat

## 2018-02-19 NOTE — Assessment & Plan Note (Signed)
Edema  s: went to urgent care last week- thought Had UTI  but on culture it was negative. On a business trip- feet and legs swole during the trip bilaterally- also had some itching. Noted foul smell to urine, frequency and urgency. Urinary symptoms have now largely resolved. Watched salt intake and swelling has gone down.   She was told needed to come to PCP to have kidneys checked.   2 doctor appointments in last week- at Urgent care 150s/90s on Thursday and then at infusion appointment was 140/90.   Reports high potassium on hctz in the past. Possibly on losartan combo pill at the time.  Upcoming travels to [place] for 3-6 months in may.  A/P: Patient would like to try to address the intermittent swelling issues before travel.  We discussed amlodipine could increase risk.  We will trial the following From AVS "  Cut amlodipine in half to 5 mg Start HCTZ 12.5mg  daily Lets recheck blood pressure and potassium in 2-4 weeks.  "

## 2018-02-21 ENCOUNTER — Ambulatory Visit: Payer: BLUE CROSS/BLUE SHIELD | Admitting: Family Medicine

## 2018-03-01 ENCOUNTER — Other Ambulatory Visit: Payer: Self-pay | Admitting: Family Medicine

## 2018-03-01 NOTE — Telephone Encounter (Signed)
Last OV 02/17/2018 Last refill 08/21/2017 #180/1 Next OV 03/13/2018

## 2018-03-05 DIAGNOSIS — M5136 Other intervertebral disc degeneration, lumbar region: Secondary | ICD-10-CM | POA: Diagnosis not present

## 2018-03-05 DIAGNOSIS — M62830 Muscle spasm of back: Secondary | ICD-10-CM | POA: Diagnosis not present

## 2018-03-05 DIAGNOSIS — M9903 Segmental and somatic dysfunction of lumbar region: Secondary | ICD-10-CM | POA: Diagnosis not present

## 2018-03-05 DIAGNOSIS — M545 Low back pain: Secondary | ICD-10-CM | POA: Diagnosis not present

## 2018-03-10 DIAGNOSIS — M545 Low back pain: Secondary | ICD-10-CM | POA: Diagnosis not present

## 2018-03-10 DIAGNOSIS — M62830 Muscle spasm of back: Secondary | ICD-10-CM | POA: Diagnosis not present

## 2018-03-10 DIAGNOSIS — M5136 Other intervertebral disc degeneration, lumbar region: Secondary | ICD-10-CM | POA: Diagnosis not present

## 2018-03-10 DIAGNOSIS — M9903 Segmental and somatic dysfunction of lumbar region: Secondary | ICD-10-CM | POA: Diagnosis not present

## 2018-03-11 DIAGNOSIS — G35 Multiple sclerosis: Secondary | ICD-10-CM | POA: Diagnosis not present

## 2018-03-12 DIAGNOSIS — M5136 Other intervertebral disc degeneration, lumbar region: Secondary | ICD-10-CM | POA: Diagnosis not present

## 2018-03-12 DIAGNOSIS — M545 Low back pain: Secondary | ICD-10-CM | POA: Diagnosis not present

## 2018-03-12 DIAGNOSIS — M62830 Muscle spasm of back: Secondary | ICD-10-CM | POA: Diagnosis not present

## 2018-03-12 DIAGNOSIS — M9903 Segmental and somatic dysfunction of lumbar region: Secondary | ICD-10-CM | POA: Diagnosis not present

## 2018-03-13 ENCOUNTER — Encounter: Payer: Self-pay | Admitting: Family Medicine

## 2018-03-13 ENCOUNTER — Ambulatory Visit: Payer: BLUE CROSS/BLUE SHIELD | Admitting: Family Medicine

## 2018-03-13 VITALS — BP 138/90 | HR 79 | Temp 98.4°F | Ht 66.0 in | Wt 242.4 lb

## 2018-03-13 DIAGNOSIS — F325 Major depressive disorder, single episode, in full remission: Secondary | ICD-10-CM | POA: Diagnosis not present

## 2018-03-13 DIAGNOSIS — E1151 Type 2 diabetes mellitus with diabetic peripheral angiopathy without gangrene: Secondary | ICD-10-CM

## 2018-03-13 DIAGNOSIS — IMO0002 Reserved for concepts with insufficient information to code with codable children: Secondary | ICD-10-CM

## 2018-03-13 DIAGNOSIS — I10 Essential (primary) hypertension: Secondary | ICD-10-CM

## 2018-03-13 DIAGNOSIS — E1165 Type 2 diabetes mellitus with hyperglycemia: Secondary | ICD-10-CM | POA: Diagnosis not present

## 2018-03-13 LAB — BASIC METABOLIC PANEL
BUN: 16 mg/dL (ref 6–23)
CALCIUM: 9.1 mg/dL (ref 8.4–10.5)
CO2: 29 mEq/L (ref 19–32)
Chloride: 100 mEq/L (ref 96–112)
Creatinine, Ser: 0.87 mg/dL (ref 0.40–1.20)
GFR: 69.31 mL/min (ref >60.00)
Glucose, Bld: 198 mg/dL — ABNORMAL HIGH (ref 70–99)
Potassium: 4.1 mEq/L (ref 3.5–5.1)
Sodium: 138 mEq/L (ref 135–145)

## 2018-03-13 LAB — MICROALBUMIN / CREATININE URINE RATIO
Creatinine,U: 126.2 mg/dL
Microalb Creat Ratio: 3.4 mg/g (ref 0.0–30.0)
Microalb, Ur: 4.2 mg/dL — ABNORMAL HIGH (ref 0.0–1.9)

## 2018-03-13 MED ORDER — AMLODIPINE BESYLATE 5 MG PO TABS: 5.0000 mg | ORAL_TABLET | Freq: Every day | ORAL | 1 refills | Status: DC

## 2018-03-13 MED ORDER — CARVEDILOL 6.25 MG PO TABS: 6.2500 mg | ORAL_TABLET | Freq: Two times a day (BID) | ORAL | 1 refills | Status: DC

## 2018-03-13 MED ORDER — DULOXETINE HCL 60 MG PO CPEP: 60.0000 mg | ORAL_CAPSULE | Freq: Every day | ORAL | 1 refills | Status: DC

## 2018-03-13 MED ORDER — HYDROCHLOROTHIAZIDE 12.5 MG PO CAPS: 12.5000 mg | ORAL_CAPSULE | Freq: Every day | ORAL | 1 refills | Status: DC

## 2018-03-13 NOTE — Progress Notes (Signed)
Phone [phone]   Subjective:  Kristin Mathews is a 48 y.o. year old very pleasant female patient who presents for/with See problem oriented charting ROS-edema has improved.  No chest pain or shortness of breath reported.  Itching on legs has improved  Past Medical History-  Patient Active Problem List   Diagnosis Date Noted  . Aortic aneurysm ([place]) 08/21/2017    Priority: High  . Pulmonary nodule 08/21/2017    Priority: High  . Immunosuppressed status ([place]) 03/05/2017    Priority: High  . Uncontrolled type 2 diabetes mellitus with peripheral circulatory disorder ([place]) 11/27/2012    Priority: High  . Multiple sclerosis ([place]) 07/05/2006    Priority: High  . Nephrolithiasis 08/21/2016    Priority: Medium  . Depression 02/25/2008    Priority: Medium  . Hyperlipidemia 08/29/2006    Priority: Medium  . Essential hypertension 07/05/2006    Priority: Medium  . Gait disturbance 04/17/2016    Priority: Low  . Tingling 04/17/2016    Priority: Low  . Snoring 04/17/2016    Priority: Low  . Urinary urgency 04/17/2016    Priority: Low  . Excessive sleepiness 04/17/2016    Priority: Low  . Allergic rhinitis 04/27/2014    Priority: Low  . Low back pain 04/27/2014    Priority: Low  . Endometriosis 07/05/2006    Priority: Low  . Class II obesity 10/09/2016    Medications- reviewed and updated Current Outpatient Medications  Medication Sig Dispense Refill  . amLODipine (NORVASC) 5 MG tablet Take 1 tablet (5 mg total) by mouth daily. Travel to [place] for 6 months- please give 6 month supply 180 tablet 1  . carvedilol (COREG) 6.25 MG tablet Take 1 tablet (6.25 mg total) by mouth 2 (two) times daily. Travel to [place]. Please give 6 month supply 360 tablet 1  . cetirizine (ZYRTEC) 10 MG tablet Take 10 mg by mouth every evening.     . DULoxetine (CYMBALTA) 60 MG capsule Take 1 capsule (60 mg total) by mouth daily. Travel to [place] for 6 months- please give 6 month supply 180 capsule 1   . fluticasone (FLONASE) 50 MCG/ACT nasal spray Place 2 sprays into both nostrils daily.    Marland Kitchen glipiZIDE (GLUCOTROL XL) 10 MG 24 hr tablet TAKE 1 TABLET DAILY WITH BREAKFAST 90 tablet 4  . glucose blood (FREESTYLE TEST STRIPS) test strip TEST BLOOD SUGAR TWICE A DAY. VARY TIMES AS DIRECTED. 200 each 3  . hydrochlorothiazide (MICROZIDE) 12.5 MG capsule Take 1 capsule (12.5 mg total) by mouth daily. Please give 6 month supply for travel to [place]. 180 capsule 1  . Insulin Disposable Pump (V-GO 40) KIT Use 1x a day 90 kit 3  . insulin lispro (HUMALOG) 100 UNIT/ML injection INJECT 76 UNITS UNDER THE SKIN ONCE AS DIRECTED WITH VGO 40 70 mL 3  . levonorgestrel-ethinyl estradiol (LYBREL,AMETHYST) 90-20 MCG tablet Take 1 tablet by mouth daily.     Marland Kitchen liraglutide (VICTOZA) 18 MG/3ML SOPN Use 1.8 mg under skin (Patient taking differently: Inject 1.5 mg into the skin daily. ) 9 pen 3  . metFORMIN (GLUCOPHAGE-XR) 500 MG 24 hr tablet TAKE 2 TABLETS (1,000 MG TOTAL) TWICE A DAY WITH A MEAL 360 tablet 4  . modafinil (PROVIGIL) 200 MG tablet Take 1 tablet (200 mg total) by mouth daily as needed. 90 tablet 1  . Naproxen Sodium 220 MG CAPS Take 440 mg by mouth 2 (two) times daily as needed (for pain.).     Marland Kitchen  natalizumab (TYSABRI) 300 MG/15ML injection Inject 300 mg into the vein every 30 (thirty) days.     No current facility-administered medications for this visit.      Objective:  BP 138/90 (BP Location: Left Arm, Patient Position: Sitting, Cuff Size: Large)   Pulse 79   Temp 98.4 F (36.9 C) (Oral)   Ht _0  (1.676 m)   Wt 242 lb 6.4 oz (110 kg)   SpO2 98%   BMI 39.12 kg/m  Gen: NAD, resting comfortably CV: RRR no rubs or gallops Lungs: CTAB no crackles, wheeze, rhonchi Abdomen: soft/nontender/nondistended/normal bowel sounds. No rebound or guarding.  Ext: trace edema again- but skin is not as tight Skin: warm, dry     Assessment and Plan   #Depression S: Depression in full remission.  No  anhedonia or depressed mood.  Symptoms noted are likely related to other chronic medical illnesses.  Compliant with Cymbalta Depression screen [facility] 2/9 03/13/2018  Decreased Interest 0  Down, Depressed, Hopeless 0  PHQ - 2 Score 0  Altered sleeping 1  Tired, decreased energy 2  Change in appetite 2  Feeling bad or failure about yourself  0  Trouble concentrating 0  Moving slowly or fidgety/restless 0  Suicidal thoughts 0  PHQ-9 Score 5  Difficult doing work/chores Somewhat difficult  A/P: Doing well/stable-continue Cymbalta  #Hypertension/edema S: continues on coreg 6.76m BID. Due to edema last visit we opted to cut amlodipine in half to 5 mg.  We started hydrochlorothiazide 12.5 mg. Seems to be better on days she doesn't take Provigil- takes as needed. Had infusion for MS on Tuesday and Bp was 124/mid 80s  BP Readings from Last 3 Encounters:  03/13/18 138/90  02/17/18 120/88  12/31/17 (!) 155/90  A/P: Mild poor diastolic control today.  She had a number recently with neurology which was excellent and 120s over 80s.  Her numbers seem to run higher after Provigil but she uses this sparingly- therefore suspect average is well under 140/90-continue Coreg, HCTZ 12.5 mg, amlodipine 5 mg.  [place]Diabetes-we will get urine microalbumin to creatinine ratio today -Last visit she agreed to call and schedule her next eye exam  Future Appointments  Date Time Provider [place]4/07/2018  4:00 PM GhPhilemon KingdomMD [facility] None  10/02/2018  1:00 PM Sater, RiNanine MeansMD [facility] None   Likely follow up [place]  Lab/Order associations: Essential hypertension - Plan: Basic metabolic panel  Major depressive disorder with single episode, in full remission ([place] Uncontrolled type 2 diabetes mellitus with peripheral circulatory disorder ([place]- Plan: Microalbumin / creatinine urine ratio  Meds ordered this encounter    Medications  . hydrochlorothiazide (MICROZIDE) 12.5 MG capsule    Sig: Take 1 capsule (12.5 mg total) by mouth daily. Please give 6 month supply for [place]   Dispense:  180 capsule    Refill:  1  . carvedilol (COREG) 6.25 MG tablet    Sig: Take 1 tablet (6.25 mg total) by mouth 2 (two) times daily. [place]    Dispense:  360 tablet    Refill:  1  . amLODipine (NORVASC) 5 MG tablet    Sig: Take 1 tablet (5 mg total) by mouth daily. [place]- please give 6 month supply    Dispense:  180 tablet    Refill:  1  .  DULoxetine (CYMBALTA) 60 MG capsule    Sig: Take 1 capsule (60 mg total) by mouth daily. Travel to [place] for 6 months- please give 6 month supply    Dispense:  180 capsule    Refill:  1   Return precautions advised.  Garret Reddish, MD

## 2018-03-13 NOTE — Patient Instructions (Addendum)
Health Maintenance Due  Topic Date Due  . OPHTHALMOLOGY EXAM - next Thursday- please have them send [place] a copy 06/15/2017  . URINE MICROALBUMIN - today 08/21/2017   Please stop by lab before you go If you do not have mychart- we will call you about results within 5 business days of [place] receiving them.  If you have mychart- we will send your results within 3 business days of [place] receiving them.  If abnormal or we want to clarify a result, we will call or mychart you to make sure you receive the message.  If you have questions or concerns or don't hear within 5-7 days, please send [place] a message or call us.   Let me know if pressure is averaging over 140/90 as would increase HCTZ to 25mg 

## 2018-03-13 NOTE — Assessment & Plan Note (Signed)
S: continues on coreg 6.25mg  BID. Due to edema last visit we opted to cut amlodipine in half to 5 mg.  We started hydrochlorothiazide 12.5 mg. Seems to be better on days she doesn't take Provigil- takes as needed. Had infusion for MS on Tuesday and Bp was 124/mid 80s  BP Readings from Last 3 Encounters:  03/13/18 138/90  02/17/18 120/88  12/31/17 (!) 155/90  A/P: Mild poor diastolic control today.  She had a number recently with neurology which was excellent and 120s over 80s.  Her numbers seem to run higher after Provigil but she uses this sparingly- therefore suspect average is well under 140/90-continue Coreg, HCTZ 12.5 mg, amlodipine 5 mg.  Gave her 25-month supply for travel to [place]

## 2018-03-17 DIAGNOSIS — M62830 Muscle spasm of back: Secondary | ICD-10-CM | POA: Diagnosis not present

## 2018-03-17 DIAGNOSIS — M5136 Other intervertebral disc degeneration, lumbar region: Secondary | ICD-10-CM | POA: Diagnosis not present

## 2018-03-17 DIAGNOSIS — M9903 Segmental and somatic dysfunction of lumbar region: Secondary | ICD-10-CM | POA: Diagnosis not present

## 2018-03-17 DIAGNOSIS — M545 Low back pain: Secondary | ICD-10-CM | POA: Diagnosis not present

## 2018-03-20 DIAGNOSIS — M62830 Muscle spasm of back: Secondary | ICD-10-CM | POA: Diagnosis not present

## 2018-03-20 DIAGNOSIS — M545 Low back pain: Secondary | ICD-10-CM | POA: Diagnosis not present

## 2018-03-20 DIAGNOSIS — M5136 Other intervertebral disc degeneration, lumbar region: Secondary | ICD-10-CM | POA: Diagnosis not present

## 2018-03-20 DIAGNOSIS — M9903 Segmental and somatic dysfunction of lumbar region: Secondary | ICD-10-CM | POA: Diagnosis not present

## 2018-04-01 ENCOUNTER — Other Ambulatory Visit: Payer: Self-pay | Admitting: Internal Medicine

## 2018-04-03 DIAGNOSIS — M62830 Muscle spasm of back: Secondary | ICD-10-CM | POA: Diagnosis not present

## 2018-04-03 DIAGNOSIS — M545 Low back pain: Secondary | ICD-10-CM | POA: Diagnosis not present

## 2018-04-03 DIAGNOSIS — M5136 Other intervertebral disc degeneration, lumbar region: Secondary | ICD-10-CM | POA: Diagnosis not present

## 2018-04-03 DIAGNOSIS — M9903 Segmental and somatic dysfunction of lumbar region: Secondary | ICD-10-CM | POA: Diagnosis not present

## 2018-04-16 ENCOUNTER — Other Ambulatory Visit: Payer: Self-pay | Admitting: *Deleted

## 2018-04-16 ENCOUNTER — Telehealth: Payer: Self-pay | Admitting: Neurology

## 2018-04-16 DIAGNOSIS — I7121 Aneurysm of the ascending aorta, without rupture: Secondary | ICD-10-CM

## 2018-04-16 DIAGNOSIS — I712 Thoracic aortic aneurysm, without rupture, unspecified: Secondary | ICD-10-CM

## 2018-04-16 NOTE — Telephone Encounter (Signed)
Pt called stating she is returning a call to Dr Felecia Shelling.  Pt states she was told her would be calling her to discuss specifics of the Ocrevus medication.  Pt is asking for a call back

## 2018-04-16 NOTE — Telephone Encounter (Signed)
pt has called for the intrafusion suite, call transferred °

## 2018-04-22 ENCOUNTER — Ambulatory Visit: Payer: BLUE CROSS/BLUE SHIELD | Admitting: Internal Medicine

## 2018-04-22 ENCOUNTER — Other Ambulatory Visit: Payer: Self-pay

## 2018-04-22 ENCOUNTER — Encounter: Payer: Self-pay | Admitting: Internal Medicine

## 2018-04-22 VITALS — BP 126/80 | HR 78 | Temp 98.4°F | Ht 66.0 in | Wt 241.0 lb

## 2018-04-22 DIAGNOSIS — E1151 Type 2 diabetes mellitus with diabetic peripheral angiopathy without gangrene: Secondary | ICD-10-CM | POA: Diagnosis not present

## 2018-04-22 DIAGNOSIS — E1165 Type 2 diabetes mellitus with hyperglycemia: Secondary | ICD-10-CM | POA: Diagnosis not present

## 2018-04-22 DIAGNOSIS — E785 Hyperlipidemia, unspecified: Secondary | ICD-10-CM | POA: Diagnosis not present

## 2018-04-22 DIAGNOSIS — IMO0002 Reserved for concepts with insufficient information to code with codable children: Secondary | ICD-10-CM

## 2018-04-22 DIAGNOSIS — E669 Obesity, unspecified: Secondary | ICD-10-CM | POA: Diagnosis not present

## 2018-04-22 DIAGNOSIS — E66812 Obesity, class 2: Secondary | ICD-10-CM

## 2018-04-22 LAB — TSH: TSH: 2.97 u[IU]/mL (ref 0.35–4.50)

## 2018-04-22 LAB — POCT GLYCOSYLATED HEMOGLOBIN (HGB A1C): Hemoglobin A1C: 8.4 % — AB (ref 4.0–5.6)

## 2018-04-22 MED ORDER — DAPAGLIFLOZIN PROPANEDIOL 5 MG PO TABS: 5.0000 mg | ORAL_TABLET | Freq: Every day | ORAL | 11 refills | Status: DC

## 2018-04-22 MED ORDER — ATORVASTATIN CALCIUM 40 MG PO TABS: 40.0000 mg | ORAL_TABLET | Freq: Every day | ORAL | 11 refills | Status: DC

## 2018-04-22 NOTE — Patient Instructions (Addendum)
Please continue: - Metformin ER 1000 mg 2x a day - Glipizide XL 10 mg in am - Victoza 1.8 mg daily in a.m. - VGo 40 - 4-28 clicks per meal  Please add: - Farxiga 5 mg before b'fast  If you start to urinate more, we may need to hold HCTZ.  Start Lipitor 40 mg daily.  Please return in 3 months with your sugar log.

## 2018-04-22 NOTE — Progress Notes (Addendum)
Patient ID: [name], female   DOB: January 10, 1971, 48 y.o.   MRN: [id number]  HPI: Kristin Mathews is a 48 y.o.-year-old female, presenting for f/u for DM2, dx 2009, insulin-dependent since 2009, uncontrolled, without long term complications. Last visit 7 months ago.  She had more dietary indiscretions lately as she is working from home during the coronavirus pandemic.  She is not checking sugars frequently.   Last hemoglobin A1c was: Lab Results  Component Value Date   HGBA1C 7.9 (A) 10/07/2017   HGBA1C 9 01/31/2017   HGBA1C 8.3 (H) 08/21/2016  She uses frequent steroid courses for MS.  She is on: - Metformin ER 1000 mg 2x a day - Glipizide XL 10 mg in am - Victoza 1.8 mg daily in a.m. - VGo 40 - 1 click for 67M >> 10 g carbs >> 4-5 clicks for b'fast; 6-7 clicks, 0-94 clicks for lunch She was previously on Bydureon. She was on Invokana 100 >> 300 >> had few yeast infections - stopped in 05/2013 and tried again in 2017 >> stopped again b/c yeast inf. We stopped Humulin 70/30 25 units bid. Januvia - did not help.  Glumetza not covered by insurance.  Pt checks her sugars 0-1 times a day: - am:  94-142, 176 >> 79-151 >> 94-149 - 2h after b'fast: 166 >> n/c >> 194 >> n/c >> 219 - lunch n/c >> 160, 219 >> 103 >> 149 - 2h after lunch:n/c >> 253 >> 66, 186 >> n/c >> 250 x1 - dinner:  64-156, 187 >> 83, 104-201 >> 75-227 >> 169 - 2h after dinner: 138-191 >> 54, 192 >> 128-191 - bedtime: 123, 131 >> 260 >> 84-239, 287 >> 161 - nighttime: 41, 43, 60, 134 >> 47-187 >> 145 >> n/c Lowest CBG: 21!! (at night) ... >> 83 >> 54 >> 80s; she has hypoglycemia awareness in the 10s. Highest 276 >> 260 (forgot to bolus) >> 287 >> 250.  Pt's meals are: - Breakfast: yoghurt + granola, cereals  - Lunch: sandwich, sushi, [place]  - Dinner: home cooked meal: stews, etc. - Snacks: 3-4   -No CKD, last BUN/creatinine:  Lab Results  Component Value Date   BUN 16 03/13/2018   CREATININE 0.87 03/13/2018   She was previously on benazepril but developed angioedema from ACE inhibitors in 2014. -+ HL; last set of lipids: Lab Results  Component Value Date   CHOL 179 08/21/2017   HDL 37.90 (L) 08/21/2017   LDLCALC 105 (H) 08/21/2016   LDLDIRECT 125.0 08/21/2017   TRIG 220.0 (H) 08/21/2017   CHOLHDL 5 08/21/2017  On pravastatin added back in 08/2017 >> but feels hungry and "off" >> stopped several mo ago. Tried Zocor >> did not like it. - last eye exam was 03/2018: No DR - + mild numbness and tingling in her feet.   She also has a history of MS (neuro Dr Kristin Mathews) - last 2 atacks: 02/2012, 2010; also HTN, fatty liver, endometriosis.  She had a kidney stone in 11/2016 (calcium oxalate)- had litotripsy. She had a total of 20-25 kidney stones throughout her life.  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  She sees cardiology.  She travels a lot for her job.    She decreased Amlodipine 2/2 itching, leg swelling. She was started on Diuretic.  ROS: Constitutional: + Weight gain/no weight loss, no fatigue, no subjective hyperthermia, no subjective hypothermia Eyes: no blurry vision, no  xerophthalmia ENT: no sore throat, no nodules palpated in neck, no dysphagia, no odynophagia, no hoarseness Cardiovascular: no CP/no SOB/no palpitations/no leg swelling Respiratory: no cough/no SOB/no wheezing Gastrointestinal: no N/no V/no D/no C/no acid reflux Musculoskeletal: no muscle aches/no joint aches Skin: no rashes, no hair loss Neurological: no tremors/+ numbness/+ tingling/no dizziness  I reviewed pt's medications, allergies, PMH, social hx, family hx, and changes were documented in the history of present illness. Otherwise, unchanged from my initial visit note.  Past Medical History:  Diagnosis Date  . Arthritis    mild  . Depression   . Diabetes mellitus    Type 2  . Endometriosis   . History of kidney stones   . Hypertension   . Multiple  sclerosis ([place])   . Type II or unspecified type diabetes mellitus with peripheral circulatory disorders, uncontrolled(250.72) 11/27/2012   Past Surgical History:  Procedure Laterality Date  . EXPLORATORY LAPAROTOMY     endometriosis  . EXTRACORPOREAL SHOCK WAVE LITHOTRIPSY Right 11/19/2016   Procedure: RIGHT EXTRACORPOREAL SHOCK WAVE LITHOTRIPSY (ESWL);  Surgeon: Kristin Aloe, MD;  Location: [facility];  Service: Urology;  Laterality: Right;  . LUMBAR DISC SURGERY     LS spine 2005 or so  . WISDOM TOOTH EXTRACTION     Social History   Socioeconomic History  . Marital status: Single    Spouse name: Not on file  . Number of children: Not on file  . Years of education: Not on file  . Highest education level: Not on file  Occupational History  . Not on file  Social Needs  . Financial resource strain: Not on file  . Food insecurity:    Worry: Not on file    Inability: Not on file  . Transportation needs:    Medical: Not on file    Non-medical: Not on file  Tobacco Use  . Smoking status: Never Smoker  . Smokeless tobacco: Never Used  Substance and Sexual Activity  . Alcohol use: Yes    Alcohol/week: 0.0 standard drinks    Comment: occasional  . Drug use: No  . Sexual activity: Not on file  Lifestyle  . Physical activity:    Days per week: Not on file    Minutes per session: Not on file  . Stress: Not on file  Relationships  . Social connections:    Talks on phone: Not on file    Gets together: Not on file    Attends religious service: Not on file    Active member of club or organization: Not on file    Attends meetings of clubs or organizations: Not on file    Relationship status: Not on file  . Intimate partner violence:    Fear of current or ex partner: Not on file    Emotionally abused: Not on file    Physically abused: Not on file    Forced sexual activity: Not on file  Other Topics Concern  . Not on file  Social History Narrative   Single. Lives alone. No  children. Pet cat.       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 amLODipine (NORVASC) 5 MG tablet Take 1 tablet (5 mg total) by mouth daily. Travel to [place]  for 6 months- please give 6 month supply 180 tablet 1  . carvedilol (COREG) 6.25 MG tablet Take 1 tablet (6.25 mg total) by mouth 2 (two) times daily. Travel to [place]. Please give 6 month supply 360 tablet 1  . cetirizine (ZYRTEC) 10 MG tablet Take 10 mg by mouth every evening.     . DULoxetine (CYMBALTA) 60 MG capsule Take 1 capsule (60 mg total) by mouth daily. Travel to [place] for 6 months- please give 6 month supply 180 capsule 1  . fluticasone (FLONASE) 50 MCG/ACT nasal spray Place 2 sprays into both nostrils daily.    Marland Kitchen glipiZIDE (GLUCOTROL XL) 10 MG 24 hr tablet TAKE 1 TABLET DAILY WITH BREAKFAST 90 tablet 4  . glucose blood (FREESTYLE TEST STRIPS) test strip TEST BLOOD SUGAR TWICE A DAY. VARY TIMES AS DIRECTED. 200 each 3  . hydrochlorothiazide (MICROZIDE) 12.5 MG capsule Take 1 capsule (12.5 mg total) by mouth daily. Please give 6 month supply for travel to [place]. 180 capsule 1  . Insulin Disposable Pump (V-GO 40) KIT Use 1x a day 90 kit 3  . insulin lispro (HUMALOG) 100 UNIT/ML injection INJECT 76 UNITS UNDER THE SKIN ONCE AS DIRECTED WITH VGO 40 70 mL 3  . levonorgestrel-ethinyl estradiol (LYBREL,AMETHYST) 90-20 MCG tablet Take 1 tablet by mouth daily.     Marland Kitchen liraglutide (VICTOZA) 18 MG/3ML SOPN INJECT 1.8 MG UNDER THE SKIN DAILY 27 mL 3  . metFORMIN (GLUCOPHAGE-XR) 500 MG 24 hr tablet TAKE 2 TABLETS (1,000 MG TOTAL) TWICE A DAY WITH A MEAL 360 tablet 4  . modafinil (PROVIGIL) 200 MG tablet Take 1 tablet (200 mg total) by mouth daily as needed. 90 tablet 1  . Naproxen Sodium 220 MG CAPS Take 440 mg by mouth 2  (two) times daily as needed (for pain.).     Marland Kitchen natalizumab (TYSABRI) 300 MG/15ML injection Inject 300 mg into the vein every 30 (thirty) days.     No current facility-administered medications on file prior to visit.    Allergies  Allergen Reactions  . Ace Inhibitors Swelling    Edema   Family History  Problem Relation Age of Onset  . Breast cancer Mother        early 51s  . Diabetes Mother        maternal grandmother as well  . Diabetes Father   . Other Father        progressive supranuclear palsy    PE: BP 126/80   Pulse 78   Temp 98.4 F (36.9 C)   Ht _0  (1.676 m)   Wt 241 lb (109.3 kg)   SpO2 98%   BMI 38.90 kg/m  Body mass index is 38.9 kg/m. Wt Readings from Last 3 Encounters:  04/22/18 241 lb (109.3 kg)  03/13/18 242 lb 6.4 oz (110 kg)  02/17/18 240 lb 3.2 oz (109 kg)   Constitutional: overweight, in NAD Eyes: PERRLA, EOMI, no exophthalmos ENT: moist mucous membranes, no thyromegaly, no cervical lymphadenopathy Cardiovascular: RRR, No RG, +1/6 SEM Respiratory: CTA B Gastrointestinal: abdomen soft, NT, ND, BS+ Musculoskeletal: no deformities, strength intact in all 4 Skin: moist, warm, no rashes Neurological: no tremor with outstretched hands, DTR normal in all 4  ASSESSMENT: 1. DM2, insulin-dependent, uncontrolled, with hyperglycemia  She saw the diabetes educator for a discussion about insulin pumps >> decided for a VGo  2. Obesity class 2 BMI Classification:  < 18.5 underweight   18.5-24.9 normal weight   25.0-29.9 overweight  30.0-34.9 class I obesity   35.0-39.9 class II obesity   ? 40.0 class III obesity   3. HL  PLAN:  1. Patient with history of uncontrolled diabetes type 2, exacerbated by steroid courses for MS and also due to her extensive traveling for work.  At last visit, HbA1c was better, at 7.9%.  She continues on the Vgo 40 patch pump, daily GLP-1 receptor agonist and also oral medication.  Her sugars usually fluctuate with  dietary indiscretion.  She was depressed over the summer and eating more.  At last visit we discussed about options to curb her cravings.  I did not change her regimen at that time since there was no clear pattern in her CBG fluctuations. -At this visit, she only has few sugars in her log and they are fluctuating.  It is difficult to draw conclusions from such few  values, but they do appear to be above target.  She has one sugar after breakfast that is above 200.  She mentions that she did increase her insulin with breakfast since last visit.  As of now, she is only taking insulin with meals and she is maxed out on the amount that she can actually bolus.  Therefore, even though she is eating snacks between meals, she cannot bolus for them.  We discussed about the importance of reducing her snacks and actually try not to snack between meals.  However, I would also like to try again an SGLT 2 inhibitor and we discussed about the cardiac benefits of this class of medicines.  She agrees to try a low-dose [place].  She did have yeast infections in the past with Invokana.  I advised her that if she has bothersome increased urinary frequency, to stop HCTZ.  She was started on the low dose of the diuretic at the last visit with PCP.  Blood pressure today is not soft. -  I advised her to:  Patient Instructions  Please continue: - Metformin ER 1000 mg 2x a day - Glipizide XL 10 mg in am - Victoza 1.8 mg daily in a.m. - VGo 40 - 0-45 clicks per meal  Please add: - Farxiga 5 mg before b'fast  If you start to urinate more, we may need to hold HCTZ.  Start Lipitor 40 mg daily.  Please return in 3 months with your sugar log.   - today, HbA1c is 8.4% (higher) - continue checking sugars at different times of the day - check 1x a day, rotating checks - advised for yearly eye exams >> she is UTD - Return to clinic in 3 mo with sugar log    2. Obesity class 2 - continue Victoza which should also help with  weight loss - gained 10 lbs since last OV - will start [place] should help with wt loss -She would like me to check her thyroid function test to see if this can contribute to her weight gain/hunger.  We will recheck a TSH today.  3. HL - Reviewed latest lipid panel from 08/2017: LDL higher than the prior year, higher than goal, HDL low, TG high Lab Results  Component Value Date   CHOL 179 08/21/2017   HDL 37.90 (L) 08/21/2017   LDLCALC 105 (H) 08/21/2016   LDLDIRECT 125.0 08/21/2017   TRIG 220.0 (H) 08/21/2017   CHOLHDL 5 08/21/2017  -She is off pravastatin and she did not like how it made her feel.  She also tried Zocor before with the same effect.  She agrees to try Lipitor.  I sent this to her pharmacy.  Office Visit on 04/22/2018  Component Date Value Ref Range Status  . TSH 04/22/2018 2.97  0.35 - 4.50 uIU/mL Final  . Hemoglobin A1C 04/22/2018 8.4* 4.0 - 5.6 % Final   TSH normal.  [name], MD PhD [facility] Endocrinology

## 2018-04-30 ENCOUNTER — Telehealth: Payer: Self-pay | Admitting: *Deleted

## 2018-04-30 DIAGNOSIS — G35 Multiple sclerosis: Secondary | ICD-10-CM | POA: Diagnosis not present

## 2018-04-30 NOTE — Telephone Encounter (Signed)
I clarified with Dr. Felecia Shelling- patient is going to be staying on Tysabri for right now instead of switching to Ocrevus d/t concerns with covid-19.   I called [employer] back and spoke with Nicki Reaper. He will have department send a new re-auth questionnaire form to be filled out. Fax: [phone]

## 2018-04-30 NOTE — Telephone Encounter (Signed)
Took call from phone staff. Spoke with Sharyn Lull [employer]). She wanted to know if we received Tysabri d/c questionnaire. Advised we have not, she will resend.

## 2018-05-01 ENCOUNTER — Other Ambulatory Visit: Payer: Self-pay | Admitting: *Deleted

## 2018-05-01 ENCOUNTER — Other Ambulatory Visit: Payer: Self-pay

## 2018-05-01 DIAGNOSIS — G35 Multiple sclerosis: Secondary | ICD-10-CM

## 2018-05-01 DIAGNOSIS — Z79899 Other long term (current) drug therapy: Secondary | ICD-10-CM

## 2018-05-01 NOTE — Telephone Encounter (Signed)
Called, LVM for pt letting her know that since she is staying on Tysabri right now, we need to check another JCV ab. She can complete this next time she is in for her Tysabri infusion. I placed future order in Epic.

## 2018-05-01 NOTE — Telephone Encounter (Signed)
Faxed completed/signed Tysabri pt status report and reauth questionnaire to [facility] at [phone]. Received confirmation.  

## 2018-05-01 NOTE — Telephone Encounter (Addendum)
Spoke with Liana (intrafusion). Pt coming next 05/29/18 for Tysabri. She was just here yesterday for infusion.

## 2018-05-26 ENCOUNTER — Telehealth: Payer: Self-pay | Admitting: Neurology

## 2018-05-26 NOTE — Telephone Encounter (Signed)
Pt has called for the infusion suite, call connected.

## 2018-05-27 ENCOUNTER — Telehealth: Payer: Self-pay | Admitting: *Deleted

## 2018-05-27 DIAGNOSIS — G35 Multiple sclerosis: Secondary | ICD-10-CM | POA: Diagnosis not present

## 2018-05-27 NOTE — Telephone Encounter (Signed)
Placed JCV lab in [facility] lock box for routine lab pick up. Results pending. 

## 2018-05-27 NOTE — Addendum Note (Signed)
Addended by: Inis Sizer D on: 05/27/2018 11:39 AM   Modules accepted: Orders

## 2018-05-29 ENCOUNTER — Other Ambulatory Visit: Payer: Self-pay

## 2018-06-01 LAB — STRATIFY JCV AB (W/ INDEX) W/ RFLX
Index Value: 0.29 — ABNORMAL HIGH
Stratify JCV (TM) Ab w/Reflex Inhibition: UNDETERMINED — AB

## 2018-06-01 LAB — RFLX STRATIFY JCV (TM) AB INHIBITION: JCV Antibody by Inhibition: NEGATIVE

## 2018-06-04 ENCOUNTER — Ambulatory Visit: Payer: BLUE CROSS/BLUE SHIELD | Admitting: Cardiothoracic Surgery

## 2018-06-04 ENCOUNTER — Encounter: Payer: Self-pay | Admitting: Cardiothoracic Surgery

## 2018-06-04 ENCOUNTER — Ambulatory Visit
Admission: RE | Admit: 2018-06-04 | Discharge: 2018-06-04 | Disposition: A | Discharge status: Home / Self Care | Payer: BLUE CROSS/BLUE SHIELD | Source: Ambulatory Visit | Attending: Cardiothoracic Surgery | Admitting: Cardiothoracic Surgery

## 2018-06-04 ENCOUNTER — Other Ambulatory Visit: Payer: Self-pay

## 2018-06-04 VITALS — BP 126/80 | HR 83 | Temp 98.6°F | Resp 16 | Ht 66.0 in | Wt 240.0 lb

## 2018-06-04 DIAGNOSIS — I712 Thoracic aortic aneurysm, without rupture, unspecified: Secondary | ICD-10-CM

## 2018-06-04 DIAGNOSIS — R911 Solitary pulmonary nodule: Secondary | ICD-10-CM

## 2018-06-04 DIAGNOSIS — I7121 Aneurysm of the ascending aorta, without rupture: Secondary | ICD-10-CM

## 2018-06-04 MED ORDER — IOPAMIDOL (ISOVUE-300) INJECTION 61%
75.0000 mL | Freq: Once | INTRAVENOUS | Status: AC | PRN
  Administered 2018-06-04: 75 mL via INTRAVENOUS

## 2018-06-04 NOTE — Progress Notes (Signed)
PCP is Marin Olp, MD Referring Provider is Lacretia Leigh, MD  Chief Complaint  Patient presents with  . TAA    8 month f/u with CT CHEST TODAY    HPI: 1 year follow-up exam with CTA for a 4 cm fusiform ascending aneurysm.  She also has a 6 mm smooth round density in the superior segment of the right lower lobe.  Both are asymptomatic.  Her main problem is diabetes, hypertension, and multiple sclerosis.  She denies chest pain palpitation or shortness of breath. She is fully employed and is working from home. She is compliant with her blood pressure medications and has her blood pressure checked frequently.  I reviewed the images of her scan today and there is no change in her minimal fusiform ascending aneurysm which remains a 4 cm diameter.  The right lung nodule remains unchanged and is without  malignant potential at this point.   Past Medical History:  Diagnosis Date  . Arthritis    mild  . Depression   . Diabetes mellitus    Type 2  . Endometriosis   . History of kidney stones   . Hypertension   . Multiple sclerosis ([place])   . Type II or unspecified type diabetes mellitus with peripheral circulatory disorders, uncontrolled(250.72) 11/27/2012    Past Surgical History:  Procedure Laterality Date  . EXPLORATORY LAPAROTOMY     endometriosis  . EXTRACORPOREAL SHOCK WAVE LITHOTRIPSY Right 11/19/2016   Procedure: RIGHT EXTRACORPOREAL SHOCK WAVE LITHOTRIPSY (ESWL);  Surgeon: Festus Aloe, MD;  Location: [facility];  Service: Urology;  Laterality: Right;  . LUMBAR DISC SURGERY     LS spine 2005 or so  . WISDOM TOOTH EXTRACTION      Family History  Problem Relation Age of Onset  . Breast cancer Mother        early 75s  . Diabetes Mother        maternal grandmother as well  . Diabetes Father   . Other Father        progressive supranuclear palsy    Social History Social History   Tobacco Use  . Smoking status: Never Smoker  . Smokeless tobacco: Never Used   Substance Use Topics  . Alcohol use: Yes    Alcohol/week: 0.0 standard drinks    Comment: occasional  . Drug use: No    Current Outpatient Medications  Medication Sig Dispense Refill  . amLODipine (NORVASC) 5 MG tablet Take 1 tablet (5 mg total) by mouth daily. Travel to [place] for 6 months- please give 6 month supply 180 tablet 1  . atorvastatin (LIPITOR) 40 MG tablet Take 1 tablet (40 mg total) by mouth daily. 30 tablet 11  . carvedilol (COREG) 6.25 MG tablet Take 1 tablet (6.25 mg total) by mouth 2 (two) times daily. Travel to [place]. Please give 6 month supply 360 tablet 1  . cetirizine (ZYRTEC) 10 MG tablet Take 10 mg by mouth every evening.     . dapagliflozin propanediol (FARXIGA) 5 MG TABS tablet Take 5 mg by mouth daily. 30 tablet 11  . DULoxetine (CYMBALTA) 60 MG capsule Take 1 capsule (60 mg total) by mouth daily. Travel to [place] for 6 months- please give 6 month supply 180 capsule 1  . fluticasone (FLONASE) 50 MCG/ACT nasal spray Place 2 sprays into both nostrils daily.    Marland Kitchen glipiZIDE (GLUCOTROL XL) 10 MG 24 hr tablet TAKE 1 TABLET DAILY WITH BREAKFAST 90 tablet 4  . glucose blood (FREESTYLE TEST  STRIPS) test strip TEST BLOOD SUGAR TWICE A DAY. VARY TIMES AS DIRECTED. 200 each 3  . hydrochlorothiazide (MICROZIDE) 12.5 MG capsule Take 1 capsule (12.5 mg total) by mouth daily. Please give 6 month supply for travel to [place]. 180 capsule 1  . Insulin Disposable Pump (V-GO 40) KIT Use 1x a day 90 kit 3  . insulin lispro (HUMALOG) 100 UNIT/ML injection INJECT 76 UNITS UNDER THE SKIN ONCE AS DIRECTED WITH VGO 40 70 mL 3  . levonorgestrel-ethinyl estradiol (LYBREL,AMETHYST) 90-20 MCG tablet Take 1 tablet by mouth daily.     Marland Kitchen liraglutide (VICTOZA) 18 MG/3ML SOPN INJECT 1.8 MG UNDER THE SKIN DAILY 27 mL 3  . metFORMIN (GLUCOPHAGE-XR) 500 MG 24 hr tablet TAKE 2 TABLETS (1,000 MG TOTAL) TWICE A DAY WITH A MEAL 360 tablet 4  . modafinil (PROVIGIL) 200 MG tablet Take 1 tablet (200 mg  total) by mouth daily as needed. 90 tablet 1  . Naproxen Sodium 220 MG CAPS Take 440 mg by mouth 2 (two) times daily as needed (for pain.).     Marland Kitchen natalizumab (TYSABRI) 300 MG/15ML injection Inject 300 mg into the vein every 30 (thirty) days.     No current facility-administered medications for this visit.     Allergies  Allergen Reactions  . Ace Inhibitors Swelling    Edema    Review of Systems   No chest pain Remains overweight Tries to ambulate or do aerobic activity 20 to 30 minutes most days Takes a stimulant for narcolepsy which tends to increase her blood pressure Mild edema with amlodipine No syncope or falls No fever productive cough night sweats  BP 126/80 (BP Location: Left Arm, Patient Position: Sitting, Cuff Size: Large)   Pulse 83   Temp 98.6 F (37 C) (Oral)   Resp 16   Ht '5\' 6"'  (1.676 m)   Wt 240 lb (108.9 kg)   SpO2 96% Comment: ON RA  BMI 38.74 kg/m  Physical Exam      Exam    General- alert and comfortable    Neck- no JVD, no cervical adenopathy palpable, no carotid bruit   Lungs- clear without rales, wheezes   Cor- regular rate and rhythm, no murmur , gallop   Abdomen- soft, non-tender   Extremities - warm, non-tender, minimal edema   Neuro- oriented, appropriate, no focal weakness   Diagnostic Tests: CT images personally viewed and counseled with patient  Impression: Stable 4.0 cm fusiform ascending aneurysm Hypertension, fairly well controlled  plan: Repeat CTA of thoracic aorta in 18 months.  She understands the goal is to keep her blood pressure less than 117 systolic.  Len Childs, MD [facility] [phone]

## 2018-06-10 ENCOUNTER — Other Ambulatory Visit: Payer: Self-pay

## 2018-06-10 MED ORDER — ATORVASTATIN CALCIUM 40 MG PO TABS: 40.0000 mg | ORAL_TABLET | Freq: Every day | ORAL | 4 refills | Status: DC

## 2018-06-10 MED ORDER — DAPAGLIFLOZIN PROPANEDIOL 5 MG PO TABS: 5.0000 mg | ORAL_TABLET | Freq: Every day | ORAL | 4 refills | Status: DC

## 2018-06-10 NOTE — Telephone Encounter (Signed)
I called [facility] and asked automated system to fax JCV results to our office. We have not received results yet. Waiting on results to be faxed to [place].

## 2018-06-11 NOTE — Telephone Encounter (Signed)
Received results. JCV ab drawn on 05/27/18 indeterminate, index: 0.29. Inhibition assay: negative

## 2018-06-30 DIAGNOSIS — G35 Multiple sclerosis: Secondary | ICD-10-CM | POA: Diagnosis not present

## 2018-07-25 ENCOUNTER — Telehealth: Payer: Self-pay | Admitting: Internal Medicine

## 2018-07-25 MED ORDER — FLUCONAZOLE 150 MG PO TABS: 150.0000 mg | ORAL_TABLET | Freq: Once | ORAL | 0 refills | Status: AC

## 2018-07-25 NOTE — Telephone Encounter (Signed)
RX sent

## 2018-07-25 NOTE — Telephone Encounter (Signed)
Patient states that Kristin Mathews has given her a yeast infection. Patient  requests to have a new RX for [facility] [address] at corner of [address]. Patient states she tried an over the counter medication for yeast infection but it did not clear up the infection.

## 2018-07-28 DIAGNOSIS — G35 Multiple sclerosis: Secondary | ICD-10-CM | POA: Diagnosis not present

## 2018-08-11 ENCOUNTER — Telehealth: Payer: Self-pay | Admitting: Internal Medicine

## 2018-08-11 MED ORDER — FLUCONAZOLE 150 MG PO TABS: 150.0000 mg | ORAL_TABLET | Freq: Every day | ORAL | 0 refills | Status: DC

## 2018-08-11 NOTE — Telephone Encounter (Signed)
Please advise 

## 2018-08-11 NOTE — Addendum Note (Signed)
Addended by: Renato Shin on: 08/11/2018 05:37 PM   Modules accepted: Orders

## 2018-08-11 NOTE — Telephone Encounter (Signed)
D/c farxiga F/u with Cruzita Lederer next available

## 2018-08-11 NOTE — Telephone Encounter (Signed)
Ok, I have sent a prescription to your pharmacy 

## 2018-08-11 NOTE — Telephone Encounter (Signed)
Patient states this is her second yeast infection with Wilder Glade in the last couple weeks and she would like to stop taking it. Please send in something for the yeast infection.  Please Advise, Thanks

## 2018-08-11 NOTE — Telephone Encounter (Signed)
Pt aware of recommendations per Dr Loanne Drilling  Requesting an Rx for the yeast infection.   [facility] [address]

## 2018-08-23 IMAGING — CR DG ABDOMEN 1V
1 series · 1 of 1 positions shown · non-contrast
Comparison: CT scan 11/16/2016

CLINICAL DATA: History of right ureteral calculus.

EXAM:
ABDOMEN - 1 VIEW

[t abdomen supine]
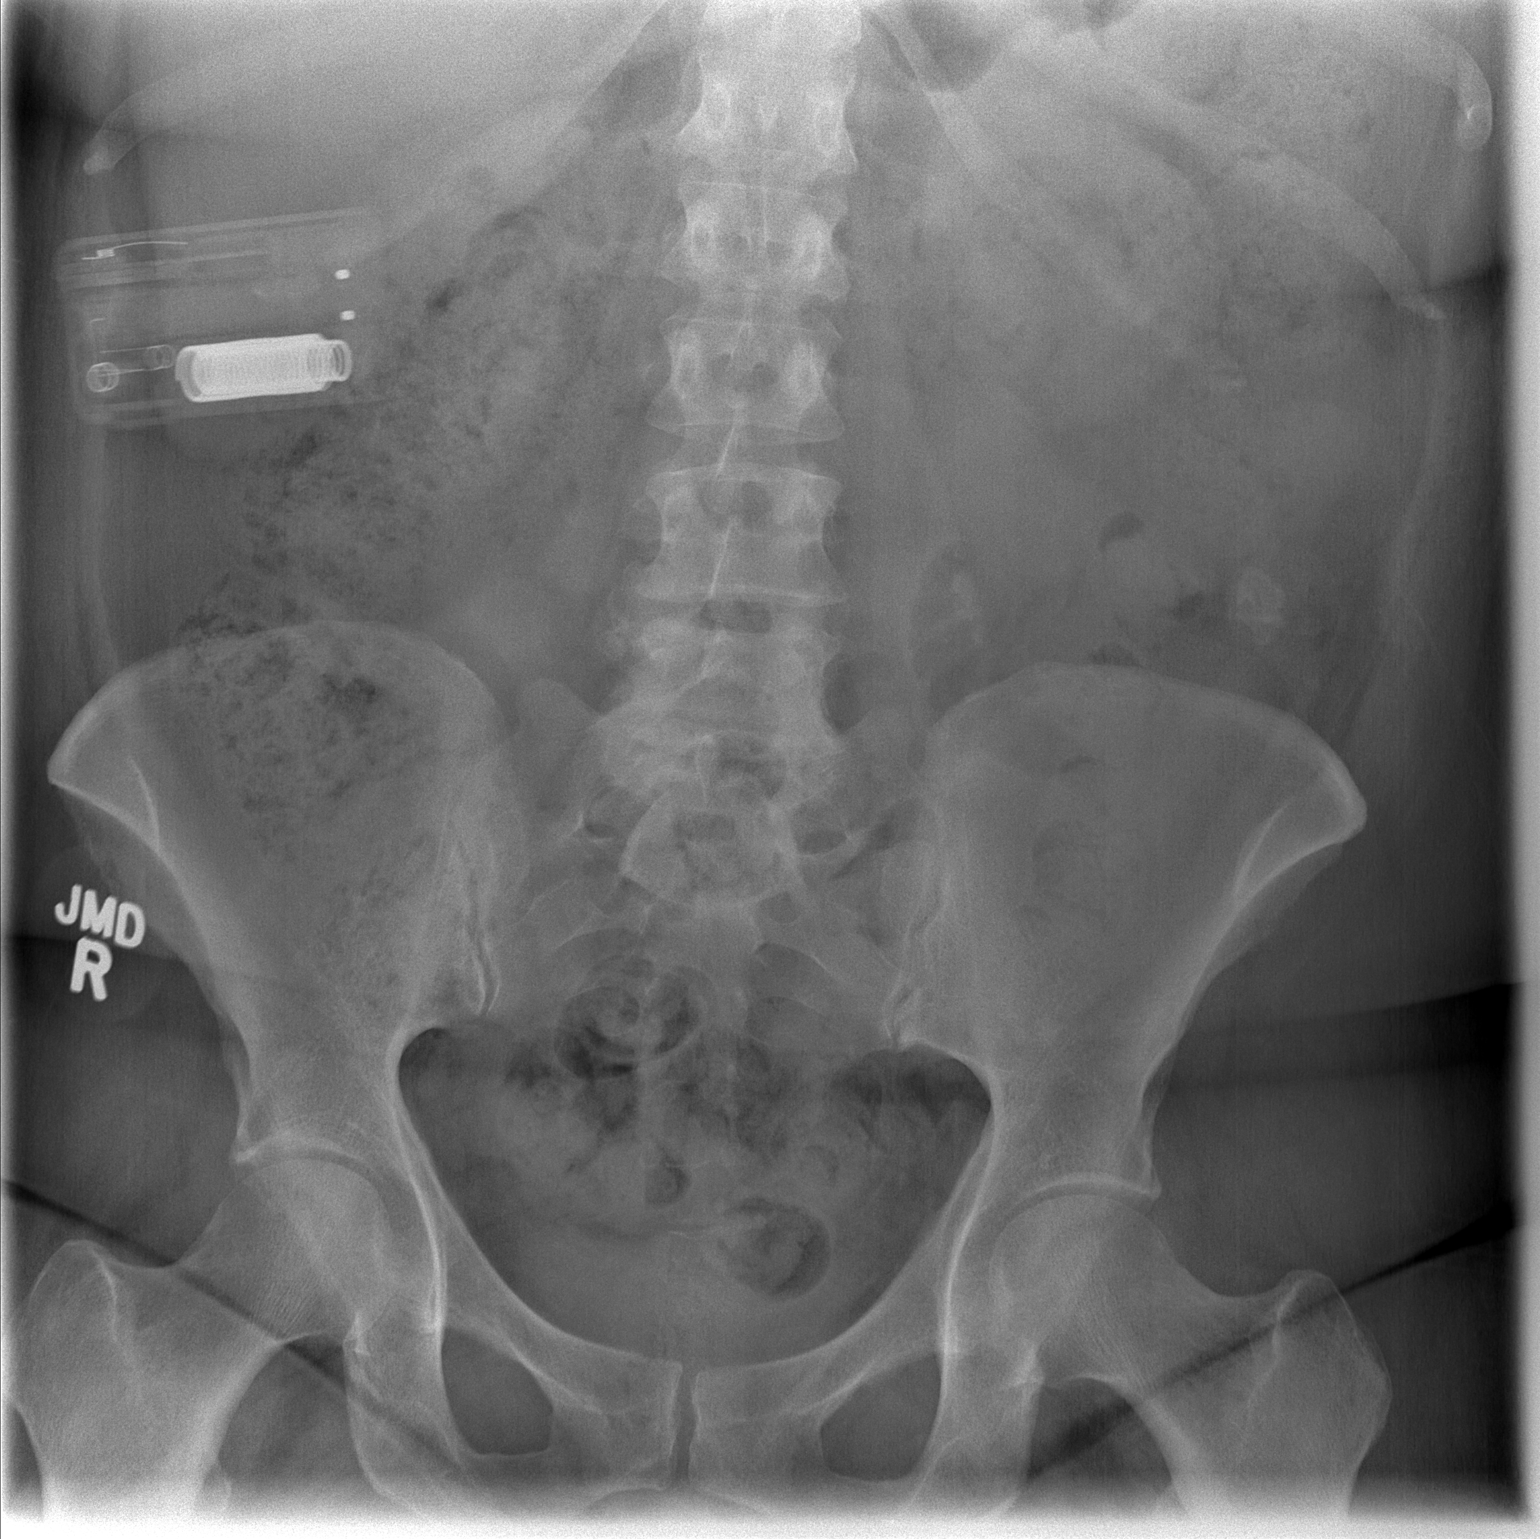

[1 of 1 positions shown; findings below may reference images not displayed]

FINDINGS: Suspect persistent right ureteral calculus located just below the
L4-5 disc space on the right side. No renal or bladder calculi are
identified for certain.
IMPRESSION: Suspect persistent right mid ureteral calculus.

## 2018-08-25 ENCOUNTER — Other Ambulatory Visit: Payer: Self-pay | Admitting: *Deleted

## 2018-08-25 DIAGNOSIS — G35 Multiple sclerosis: Secondary | ICD-10-CM | POA: Diagnosis not present

## 2018-08-25 MED ORDER — MODAFINIL 200 MG PO TABS: 200.0000 mg | ORAL_TABLET | Freq: Every day | ORAL | 1 refills | Status: DC | PRN

## 2018-08-25 NOTE — Telephone Encounter (Signed)
Pt here for Tysabri infusion today. Requesting refill on Provigil. She was last seen 03/03/17 and next f.u 10/02/18. I checked drug registry. I do not see any recent refills. Last prescribed by Dr. Felecia Shelling 04/10/16 #90. 1 refill.

## 2018-08-25 NOTE — Telephone Encounter (Signed)
Ok to refill 6 months 

## 2018-09-23 DIAGNOSIS — G35 Multiple sclerosis: Secondary | ICD-10-CM | POA: Diagnosis not present

## 2018-10-02 ENCOUNTER — Ambulatory Visit: Payer: BC Managed Care – PPO | Admitting: Neurology

## 2018-10-02 ENCOUNTER — Telehealth: Payer: Self-pay | Admitting: *Deleted

## 2018-10-02 ENCOUNTER — Other Ambulatory Visit: Payer: Self-pay

## 2018-10-02 ENCOUNTER — Encounter: Payer: Self-pay | Admitting: Neurology

## 2018-10-02 VITALS — BP 158/99 | HR 81 | Temp 96.4°F | Ht 66.0 in | Wt 244.5 lb

## 2018-10-02 DIAGNOSIS — G4710 Hypersomnia, unspecified: Secondary | ICD-10-CM | POA: Diagnosis not present

## 2018-10-02 DIAGNOSIS — G35 Multiple sclerosis: Secondary | ICD-10-CM | POA: Diagnosis not present

## 2018-10-02 DIAGNOSIS — Z79899 Other long term (current) drug therapy: Secondary | ICD-10-CM

## 2018-10-02 DIAGNOSIS — R269 Unspecified abnormalities of gait and mobility: Secondary | ICD-10-CM

## 2018-10-02 NOTE — Progress Notes (Addendum)
[facility]  PATIENT: [name] DOB: [birth year]  REFERRING CLINICIAN: Dr. Leanne Chang  HISTORY FROM: Patient REASON FOR VISIT: MS   HISTORICAL  CHIEF COMPLAINT:  Chief Complaint  Patient presents with  . Follow-up    [name]2, alone. Last seen 12/31/2017. NEEDS LABS TODAY  . Multiple Sclerosis    On Tysabri. Last JCV 05/27/18 indeterminate, negative. Index: 0.29. Last week:09/23/2018. Next infusion: 10/21/2018      HISTORY OF PRESENT ILLNESS:  Kristin Mathews is a 48 y.o. woman with relapsing remitting MS dx 2001.  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  She tolerates it well and has not had any exacerbations.  She was JCV antibody negative when last checked 12/31/2017.   We discussed CoVid-19 risks and CDC guidance.  Gait is doing well for the most part though balance is off and she uses the banister to go up and down stairs.  Strength and sensation is fine in the legs.  She denies any difficulty with her bladder.  Vision function is fine.  She notes more fatigue and a couple days needed to take a nap.    She is sleeping well most nights since starting melatonin.  She denies any difficulties with cognition.  Her mood is fine.  Update 12/31/2017: She has been on Tysabri for about 5 years.  She tolerated well and has not had any exacerbations.   Her next Tysabri infusion will be 01/12/2018.  She is likely going to be out of the [place] 4 to 6 months starting in April for work.  We had a conversation about her MS disease modifying therapy options.  She has mild spasticity in her limbs if sitting/laying a long time and getting up.  No difficulty with gait though balance is slightly off on stairs and she uses the bannister.   Strength and sensation are fine.   Bladder is fine.  Vision is fine.   She notes fatigue (often work related).     Mood is usually ok.   Cognition is fine.     From 04/17/2016: She presented  with unilateral numbness in May 2001 and was diagnosed with transverse myelitis.    MRI only showed one spot and an LP was non-diagnostic.   She had another episode of numbness 6 months later and MRI showed several newer lesions.    She was placed on REbif Kristin part of the Rebiject study.     She had an exacerbation in 2007 and in 2010 with numbness and then optic neuritis.   In 2015, she had another numbness exacerbation and we decided to switch to Tysabri.   She has had 3 doses, last dose was 12/1/8/205.    Insurance Nurse, mental health) will not cover Tysabri in a hospital outpt center and we are trying to find out how best to continue her infusions.     She tolerates Tysabri well.  Gait/strength/sensation   She notes mild spasticity but no weakness.   Gait is fine but she feels off balanced if she stands on a chair or ladder.   She fell and hit her head once while changing a smoke alarm battery.  Her tingling is mostly in the toes and bottoms of her feet.    Bladder:    She has bowel frequency and rare incontinence, especially if sugars are high.   Rare bowel incontinence, also if sugars very high  Vision:   She notes mild visual blurring.  She has been told in the past that her allergies are affecting her vision.   Blurriness improves after she is up a while.   She is going to see ophtho.     Fatigue is often a problem.   Thi is physical > mental.    She has poor sleep with a lot of nocturnal awakenings.    Usually she falls back asleep when she wakes up.    She snores.   No reports of apnea.  She is sleepy during the day, helped byu Provigil  EPWORTH SLEEPINESS SCALE  On a scale of 0 - 3 what is the chance of dozing:  Sitting and Reading:   3 Watching TV:    3 Sitting inactive in a public place: 0 Passenger in car for one hour: 3 Lying down to rest in the afternoon: 3 Sitting and talking to someone: 0 Sitting quietly after lunch:  3 In a car, stopped in traffic:  0  Total (out of 24):    15/24  (moderate sleepiness)  Mood:   She denies any difficulty with depression or anxiety.   No cognitive issues   REVIEW OF SYSTEMS:  Constitutional: No fevers, chills, sweats, or change in appetite.   Has fatigue.  Has Poor sleep Eyes: No visual changes, double vision, eye pain Ear, nose and throat: No hearing loss, ear pain, nasal congestion, sore throat Cardiovascular: No chest pain, palpitations Respiratory:  No shortness of breath at rest or with exertion.   No wheezes.   Snoring GastrointestinaI: No nausea, vomiting, diarrhea, abdominal pain.   Rare fecal incontinence Genitourinary:    She reports urgency with rare incontinence Musculoskeletal:  No neck pain, back pain Integumentary: No rash, pruritus, skin lesions Neurological: Kristin above Psychiatric: No depression at this time.  No anxiety Endocrine: No palpitations, diaphoresis, change in appetite, change in weigh or increased thirst Hematologic/Lymphatic:  No anemia, purpura, petechiae. Allergic/Immunologic: No itchy/runny eyes, nasal congestion, recent allergic reactions, rashes  ALLERGIES: Allergies  Allergen Reactions  . Ace Inhibitors Swelling    Edema    HOME MEDICATIONS: Outpatient Medications Prior to Visit  Medication Sig Dispense Refill  . amLODipine (NORVASC) 5 MG tablet Take 1 tablet (5 mg total) by mouth daily. Travel to [place] for 6 months- please give 6 month supply 180 tablet 1  . atorvastatin (LIPITOR) 40 MG tablet Take 1 tablet (40 mg total) by mouth daily. 90 tablet 4  . carvedilol (COREG) 6.25 MG tablet Take 1 tablet (6.25 mg total) by mouth 2 (two) times daily. Travel to [place]. Please give 6 month supply 360 tablet 1  . cetirizine (ZYRTEC) 10 MG tablet Take 10 mg by mouth every evening.     . DULoxetine (CYMBALTA) 60 MG capsule Take 1 capsule (60 mg total) by mouth daily. Travel to [place] for 6 months- please give 6 month supply 180 capsule 1  . fluticasone (FLONASE) 50 MCG/ACT nasal spray Place 2 sprays  into both nostrils daily.    Marland Kitchen glipiZIDE (GLUCOTROL XL) 10 MG 24 hr tablet TAKE 1 TABLET DAILY WITH BREAKFAST 90 tablet 4  . glucose blood (FREESTYLE TEST STRIPS) test strip TEST BLOOD SUGAR TWICE A DAY. VARY TIMES Kristin DIRECTED. 200 each 3  . hydrochlorothiazide (MICROZIDE) 12.5 MG capsule Take 1 capsule (12.5 mg total) by mouth daily. Please give 6 month supply for travel to [place]. 180 capsule 1  . Insulin Disposable Pump (V-GO 40) KIT Use 1x a day 90 kit 3  . insulin  lispro (HUMALOG) 100 UNIT/ML injection INJECT 76 UNITS UNDER THE SKIN ONCE Kristin DIRECTED WITH VGO 40 70 mL 3  . levonorgestrel-ethinyl estradiol (LYBREL,AMETHYST) 90-20 MCG tablet Take 1 tablet by mouth daily.     Marland Kitchen liraglutide (VICTOZA) 18 MG/3ML SOPN INJECT 1.8 MG UNDER THE SKIN DAILY 27 mL 3  . metFORMIN (GLUCOPHAGE-XR) 500 MG 24 hr tablet TAKE 2 TABLETS (1,000 MG TOTAL) TWICE A DAY WITH A MEAL 360 tablet 4  . modafinil (PROVIGIL) 200 MG tablet Take 1 tablet (200 mg total) by mouth daily Kristin needed. 90 tablet 1  . Naproxen Sodium 220 MG CAPS Take 440 mg by mouth 2 (two) times daily Kristin needed (for pain.).     Marland Kitchen natalizumab (TYSABRI) 300 MG/15ML injection Inject 300 mg into the vein every 30 (thirty) days.    . dapagliflozin propanediol (FARXIGA) 5 MG TABS tablet Take 5 mg by mouth daily. 90 tablet 4  . fluconazole (DIFLUCAN) 150 MG tablet Take 1 tablet (150 mg total) by mouth daily. 3 tablet 0   No facility-administered medications prior to visit.     PAST MEDICAL HISTORY: Past Medical History:  Diagnosis Date  . Arthritis    mild  . Depression   . Diabetes mellitus    Type 2  . Endometriosis   . History of kidney stones   . Hypertension   . Multiple sclerosis ([place])   . Type II or unspecified type diabetes mellitus with peripheral circulatory disorders, uncontrolled(250.72) 11/27/2012    PAST SURGICAL HISTORY: Past Surgical History:  Procedure Laterality Date  . EXPLORATORY LAPAROTOMY     endometriosis  .  EXTRACORPOREAL SHOCK WAVE LITHOTRIPSY Right 11/19/2016   Procedure: RIGHT EXTRACORPOREAL SHOCK WAVE LITHOTRIPSY (ESWL);  Surgeon: Festus Aloe, MD;  Location: [facility];  Service: Urology;  Laterality: Right;  . LUMBAR DISC SURGERY     LS spine 2005 or so  . WISDOM TOOTH EXTRACTION      FAMILY HISTORY: Family History  Problem Relation Age of Onset  . Breast cancer Mother        early 30s  . Diabetes Mother        maternal grandmother Kristin well  . Diabetes Father   . Other Father        progressive supranuclear palsy    SOCIAL HISTORY:  Social History   Socioeconomic History  . Marital status: Single    Spouse name: Not on file  . Number of children: Not on file  . Years of education: Not on file  . Highest education level: Not on file  Occupational History  . Not on file  Social Needs  . Financial resource strain: Not on file  . Food insecurity    Worry: Not on file    Inability: Not on file  . Transportation needs    Medical: Not on file    Non-medical: Not on file  Tobacco Use  . Smoking status: Never Smoker  . Smokeless tobacco: Never Used  Substance and Sexual Activity  . Alcohol use: Yes    Alcohol/week: 0.0 standard drinks    Comment: occasional  . Drug use: No  . Sexual activity: Not on file  Lifestyle  . Physical activity    Days per week: Not on file    Minutes per session: Not on file  . Stress: Not on file  Relationships  . Social Herbalist on phone: Not on file    Gets together: Not on file  Attends religious service: Not on file    Active member of club or organization: Not on file    Attends meetings of clubs or organizations: Not on file    Relationship status: Not on file  . Intimate partner violence    Fear of current or ex partner: Not on file    Emotionally abused: Not on file    Physically abused: Not on file    Forced sexual activity: Not on file  Other Topics Concern  . Not on file  Social History Narrative    Single. Lives alone. No children. Pet cat.       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   General: The patient is well-developed and well-nourished and in no acute distress   Neurologic Exam  Mental status: The patient is alert and oriented x 3 at the time of the examination. The patient has apparent normal recent and remote memory, with an apparently normal attention span and concentration ability.   Speech is normal.  Cranial nerves: Extraocular movements are full.  Her vision and, vision is symmetric.Marland Kitchen There is mild reduced right facial sensation to soft touch.  Facial strength is normal.  Trapezius and sternocleidomastoid strength is normal. No dysarthria is noted.   no obvious hearing deficits are noted.  Motor:  Muscle bulk and tone are normal. Strength is  5 / 5 in all 4 extremities.   Sensory: Sensory testing is intact to pinprick, soft touch, vibration sensation, and position sense in her hands and knees  Coordination: Cerebellar testing reveals Good finger-to-nose and heel-to-shin bilaterally.  Gait and station: Station is normal.  Gait is mildly wide.. Tandem gait is wide. Romberg is negative.   Reflexes: Deep tendon reflexes are symmetric and normal bilaterally.        DIAGNOSTIC DATA (LABS, IMAGING, TESTING) - I reviewed patient records, labs, notes, testing and imaging myself where available.  Lab Results  Component Value Date   WBC 14.4 (H) 12/31/2017   HGB 14.2 12/31/2017   HCT 43.8 12/31/2017   MCV 87 12/31/2017   PLT 408 12/31/2017      Component Value Date/Time   NA 138 03/13/2018 1402   NA 138 12/31/2017 1421   K  4.1 03/13/2018 1402   CL 100 03/13/2018 1402   CO2 29 03/13/2018 1402   GLUCOSE 198 (H) 03/13/2018 1402   BUN 16 03/13/2018 1402   BUN 14 12/31/2017 1421   CREATININE 0.87 03/13/2018 1402   CREATININE 0.73 04/30/2014 1109   CALCIUM 9.1 03/13/2018 1402   PROT 6.6 12/31/2017 1421   ALBUMIN 4.2 12/31/2017 1421   AST 15 12/31/2017 1421   ALT 17 12/31/2017 1421   ALKPHOS 91 12/31/2017 1421   BILITOT 0.2 12/31/2017 1421   GFRNONAA 78 12/31/2017 1421   GFRNONAA >89 04/30/2014 1109   GFRAA 90 12/31/2017 1421   GFRAA >89 04/30/2014 1109   Lab Results  Component Value Date   CHOL 179 08/21/2017   HDL 37.90 (L) 08/21/2017   LDLCALC 105 (H)  08/21/2016   LDLDIRECT 125.0 08/21/2017   TRIG 220.0 (H) 08/21/2017   CHOLHDL 5 08/21/2017   Lab Results  Component Value Date   HGBA1C 8.4 (A) 04/22/2018   No results found for: VITAMINB12 Lab Results  Component Value Date   TSH 2.97 04/22/2018       ASSESSMENT AND PLAN  Multiple sclerosis ([place]) - Plan: Stratify JCV Antibody Test ([facility]), CBC with Differential/Platelet  High risk medication use - Plan: Stratify JCV Antibody Test ([facility]), CBC with Differential/Platelet  Gait disturbance  Excessive sleepiness   1.   She will continue Tysabri.  We will check blood work today including JCV antibody.    She has done well on Tysabri with no exacerbations.  We did discuss that sometimes people with MS can be taken off medications after many years of stability.  However, I generally prefer to wait until person is 76 year old or older 2.   Stay active and exercise Kristin tolerated.   3    continue melatonin for insomnia  4.   she will return to see me in 5-6 months if stable or sooner if she is found to have obstructive sleep apnea or if she notes other new or worsening symptoms.  Isiaih Hollenbach A. Felecia Shelling, MD, PhD 04/04/939, 7:91 PM Certified in Neurology, Clinical Neurophysiology, Sleep Medicine, Pain Medicine and Neuroimaging  [facility] [address] [place], [place] [address] [phone]

## 2018-10-02 NOTE — Telephone Encounter (Signed)
Placed JCV lab in [facility] lock box for routine lab pick up. Results pending. 

## 2018-10-03 LAB — CBC WITH DIFFERENTIAL/PLATELET
Basophils Absolute: 0.1 10*3/uL (ref 0.0–0.2)
Basos: 1 %
EOS (ABSOLUTE): 0.8 10*3/uL — ABNORMAL HIGH (ref 0.0–0.4)
Eos: 6 %
Hematocrit: 43.5 % (ref 34.0–46.6)
Hemoglobin: 14.1 g/dL (ref 11.1–15.9)
Immature Grans (Abs): 0.1 10*3/uL (ref 0.0–0.1)
Immature Granulocytes: 1 %
Lymphocytes Absolute: 5.8 10*3/uL — ABNORMAL HIGH (ref 0.7–3.1)
Lymphs: 45 %
MCH: 28 pg (ref 26.6–33.0)
MCHC: 32.4 g/dL (ref 31.5–35.7)
MCV: 86 fL (ref 79–97)
Monocytes Absolute: 0.6 10*3/uL (ref 0.1–0.9)
Monocytes: 5 %
NRBC: 1 % — ABNORMAL HIGH (ref 0–0)
Neutrophils Absolute: 5.4 10*3/uL (ref 1.4–7.0)
Neutrophils: 42 %
Platelets: 374 10*3/uL (ref 150–450)
RBC: 5.04 x10E6/uL (ref 3.77–5.28)
RDW: 15.3 % (ref 11.7–15.4)
WBC: 12.7 10*3/uL — ABNORMAL HIGH (ref 3.4–10.8)

## 2018-10-13 NOTE — Telephone Encounter (Signed)
We have not received results yet. I called [facility] and requested results be faxed to [phone]. Waiting on results.

## 2018-10-14 ENCOUNTER — Telehealth: Payer: Self-pay | Admitting: *Deleted

## 2018-10-14 NOTE — Telephone Encounter (Signed)
Received fax from touch prescribing program that pt re-authorized for Tysabri from 10/14/18-05/14/19. Patient enrollment number: [id number]. Account: [facility]. Site auth number: [id number]

## 2018-10-14 NOTE — Telephone Encounter (Signed)
JCV drawn on 10/02/18 indeterminate index: 0.23, inhibition assay: negative

## 2018-10-14 NOTE — Telephone Encounter (Signed)
Faxed completed/signed Tysabri pt status report and reauth questionnaire to [facility] at [phone]. Received confirmation.  

## 2018-10-21 DIAGNOSIS — G35 Multiple sclerosis: Secondary | ICD-10-CM | POA: Diagnosis not present

## 2018-11-23 ENCOUNTER — Other Ambulatory Visit: Payer: Self-pay | Admitting: Internal Medicine

## 2018-11-24 DIAGNOSIS — G35 Multiple sclerosis: Secondary | ICD-10-CM | POA: Diagnosis not present

## 2018-12-03 ENCOUNTER — Other Ambulatory Visit: Payer: Self-pay | Admitting: Internal Medicine

## 2018-12-18 ENCOUNTER — Other Ambulatory Visit: Payer: Self-pay | Admitting: Internal Medicine

## 2018-12-22 DIAGNOSIS — G35 Multiple sclerosis: Secondary | ICD-10-CM | POA: Diagnosis not present

## 2018-12-30 ENCOUNTER — Telehealth: Payer: Self-pay

## 2018-12-30 DIAGNOSIS — N760 Acute vaginitis: Secondary | ICD-10-CM | POA: Diagnosis not present

## 2018-12-30 DIAGNOSIS — Z1231 Encounter for screening mammogram for malignant neoplasm of breast: Secondary | ICD-10-CM | POA: Diagnosis not present

## 2018-12-30 DIAGNOSIS — Z01419 Encounter for gynecological examination (general) (routine) without abnormal findings: Secondary | ICD-10-CM | POA: Diagnosis not present

## 2018-12-30 DIAGNOSIS — Z6839 Body mass index (BMI) 39.0-39.9, adult: Secondary | ICD-10-CM | POA: Diagnosis not present

## 2018-12-30 MED ORDER — FREESTYLE LITE DEVI: 0 refills | Status: DC

## 2018-12-30 NOTE — Telephone Encounter (Signed)
RX for new meter sent to local pharmacy.

## 2018-12-30 NOTE — Telephone Encounter (Signed)
Patient called in stating that her meter is broken and wanted to request a new one, but she is requesting a Free Style [place]     Please call and advise

## 2019-01-07 MED ORDER — FREESTYLE TEST VI STRP: ORAL_STRIP | 3 refills | Status: DC

## 2019-01-07 NOTE — Addendum Note (Signed)
Addended by: Cardell Peach I on: 01/07/2019 01:32 PM   Modules accepted: Orders

## 2019-01-07 NOTE — Telephone Encounter (Signed)
This has been sent

## 2019-01-07 NOTE — Telephone Encounter (Signed)
Patient called to advise that she also needed the Free Style sensor sent to the [facility] on [address], [address] [employer]

## 2019-01-26 ENCOUNTER — Other Ambulatory Visit: Payer: Self-pay | Admitting: Internal Medicine

## 2019-01-29 DIAGNOSIS — G35 Multiple sclerosis: Secondary | ICD-10-CM | POA: Diagnosis not present

## 2019-02-09 ENCOUNTER — Other Ambulatory Visit: Payer: Self-pay | Admitting: Family Medicine

## 2019-02-23 ENCOUNTER — Other Ambulatory Visit: Payer: Self-pay | Admitting: Family Medicine

## 2019-03-04 DIAGNOSIS — G35 Multiple sclerosis: Secondary | ICD-10-CM | POA: Diagnosis not present

## 2019-03-18 ENCOUNTER — Telehealth: Payer: Self-pay | Admitting: Internal Medicine

## 2019-03-18 NOTE — Telephone Encounter (Signed)
Tried to call patient. No answer, VM full.

## 2019-03-18 NOTE — Telephone Encounter (Signed)
Patient requests to be called at ph# [phone] to be advised re: [facility] requirements that shows patient is testing 4 x per day. Patient has been unable to test due to her device failure which is why patient requested the [facility]. Patient question: Is 1 Month of testing 4 x per day adequate to receive the [place]? Please call patient at the ph# listed above to advise.

## 2019-03-20 MED ORDER — FREESTYLE LIBRE 14 DAY SENSOR MISC: 0 refills | Status: DC

## 2019-03-20 MED ORDER — FREESTYLE LIBRE 14 DAY READER DEVI: 1.0000 | 0 refills | Status: DC

## 2019-03-20 NOTE — Addendum Note (Signed)
Addended by: Cardell Peach I on: 03/20/2019 01:22 PM   Modules accepted: Orders

## 2019-03-20 NOTE — Addendum Note (Signed)
Addended by: Cardell Peach I on: 03/20/2019 11:52 AM   Modules accepted: Orders

## 2019-03-30 ENCOUNTER — Other Ambulatory Visit: Payer: Self-pay

## 2019-03-30 ENCOUNTER — Ambulatory Visit: Payer: BC Managed Care – PPO | Admitting: Internal Medicine

## 2019-03-30 ENCOUNTER — Encounter: Payer: Self-pay | Admitting: Internal Medicine

## 2019-03-30 VITALS — BP 110/60 | HR 90 | Ht 66.0 in | Wt 246.0 lb

## 2019-03-30 DIAGNOSIS — E1165 Type 2 diabetes mellitus with hyperglycemia: Secondary | ICD-10-CM

## 2019-03-30 DIAGNOSIS — E669 Obesity, unspecified: Secondary | ICD-10-CM | POA: Diagnosis not present

## 2019-03-30 DIAGNOSIS — E1151 Type 2 diabetes mellitus with diabetic peripheral angiopathy without gangrene: Secondary | ICD-10-CM | POA: Diagnosis not present

## 2019-03-30 DIAGNOSIS — IMO0002 Reserved for concepts with insufficient information to code with codable children: Secondary | ICD-10-CM

## 2019-03-30 DIAGNOSIS — E785 Hyperlipidemia, unspecified: Secondary | ICD-10-CM

## 2019-03-30 LAB — COMPREHENSIVE METABOLIC PANEL
ALT: 32 U/L (ref 0–35)
AST: 30 U/L (ref 0–37)
Albumin: 3.8 g/dL (ref 3.5–5.2)
Alkaline Phosphatase: 76 U/L (ref 39–117)
BUN: 17 mg/dL (ref 6–23)
CO2: 29 mEq/L (ref 19–32)
Calcium: 9.6 mg/dL (ref 8.4–10.5)
Chloride: 99 mEq/L (ref 96–112)
Creatinine, Ser: 0.99 mg/dL (ref 0.40–1.20)
GFR: 59.45 mL/min — ABNORMAL LOW (ref >60.00)
Glucose, Bld: 162 mg/dL — ABNORMAL HIGH (ref 70–99)
Potassium: 3.9 mEq/L (ref 3.5–5.1)
Sodium: 138 mEq/L (ref 135–145)
Total Bilirubin: 0.5 mg/dL (ref 0.2–1.2)
Total Protein: 6.9 g/dL (ref 6.0–8.3)

## 2019-03-30 LAB — LIPID PANEL
Cholesterol: 100 mg/dL (ref 0–200)
HDL: 31.9 mg/dL — ABNORMAL LOW (ref >39.00)
LDL Cholesterol: 33 mg/dL (ref 0–99)
NonHDL: 67.98
Total CHOL/HDL Ratio: 3
Triglycerides: 177 mg/dL — ABNORMAL HIGH (ref 0.0–149.0)
VLDL: 35.4 mg/dL (ref 0.0–40.0)

## 2019-03-30 LAB — MICROALBUMIN / CREATININE URINE RATIO
Creatinine,U: 163 mg/dL
Microalb Creat Ratio: 4.3 mg/g (ref 0.0–30.0)
Microalb, Ur: 7.1 mg/dL — ABNORMAL HIGH (ref 0.0–1.9)

## 2019-03-30 LAB — POCT GLYCOSYLATED HEMOGLOBIN (HGB A1C): Hemoglobin A1C: 8.7 % — AB (ref 4.0–5.6)

## 2019-03-30 MED ORDER — OZEMPIC (1 MG/DOSE) 2 MG/1.5ML ~~LOC~~ SOPN: 1.0000 mg | PEN_INJECTOR | SUBCUTANEOUS | 5 refills | Status: DC

## 2019-03-30 MED ORDER — INSULIN PEN NEEDLE 32G X 4 MM MISC: 3 refills | Status: DC

## 2019-03-30 MED ORDER — FREESTYLE LIBRE 14 DAY SENSOR MISC: 3 refills | Status: DC

## 2019-03-30 MED ORDER — HUMALOG KWIKPEN 200 UNIT/ML ~~LOC~~ SOPN: PEN_INJECTOR | SUBCUTANEOUS | 5 refills | Status: DC

## 2019-03-30 NOTE — Patient Instructions (Addendum)
Please continue: - Metformin ER 1000 mg 2x a dayt - Glipizide XL 10 mg in am - Victoza 1.8 mg daily in a.m. - VGo 40 - [id number] clicks per meal  When you finish Victoza, start: - Ozempic 1 mg weekly, e.g. on Sunday am  Try to start: - Humalog 18-25 units before dinner.  Please return in 3 months with your sugar log.

## 2019-03-30 NOTE — Progress Notes (Signed)
Patient ID: Kristin Mathews, female   DOB: 05-Jan-1971, 49 y.o.   MRN: [id number]  This visit occurred during the SARS-CoV-2 public health emergency.  Safety protocols were in place, including screening questions prior to the visit, additional usage of staff PPE, and extensive cleaning of exam room while observing appropriate contact time as indicated for disinfecting solutions.   HPI: Kristin Mathews is a 49 y.o.-year-old female, presenting for f/u for DM2, dx 2009, insulin-dependent since 2009, uncontrolled, without long term complications. Last visit 11 months ago.   Last hemoglobin A1c was: Lab Results  Component Value Date   HGBA1C 8.4 (A) 04/22/2018   HGBA1C 7.9 (A) 10/07/2017   HGBA1C 9 01/31/2017  She uses frequent steroid courses for MS.  She is on: - Metformin ER 1000 mg 2x a day - Glipizide XL 10 mg in am - Victoza 1.8 mg daily in a.m. - need to change per insurance preference -   - added 04/2018 >> stopped b/c yeast inf ~40-98/1191 - VGo 40 - 1 click for 47W >> 10 g carbs >> 2-95 clicks per meal ( 1 AOZHY/86 g carbs) She was previously on Bydureon. She was on Invokana 100 >> 300 >> had few yeast infections - stopped in 05/2013 and tried again in 2017 >> stopped again b/c yeast inf. We stopped Humulin 70/30 25 units bid. Januvia - did not help.  Glumetza not covered by insurance.  Pt.checks her sugars more than 4 times a day with her libre CGM -started 1 week ago.  Freestyle libre CGM parameters: - Average: 155 - % active CGM time:  41% of the time - Glucose variability 38.8%(target < or = to 36%) - time in range:  - very low (<54): 0% - low (54-69): 1% - normal range (70-180): 70% - high sugars (181-250): 18% - very high sugars (>250): 11%  Lowest CBG: 21!! (at night) ... >> 83 >> 54 >> 80s; she has hypoglycemia awareness in the 38s. Highest 276 >> 260 (forgot to bolus) >> 287 >> 250.  Pt's meals are: - Breakfast: yoghurt + granola, cereals  - Lunch: sandwich,  sushi, [place]  - Dinner: home cooked meal: stews, etc. - Snacks: 3-4   -No CKD, last BUN/creatinine:  Lab Results  Component Value Date   BUN 16 03/13/2018   CREATININE 0.87 03/13/2018  She was previously on benazepril but developed angioedema from ACE inhibitors in 2014. -+ HL; last set of lipids: Lab Results  Component Value Date   CHOL 179 08/21/2017   HDL 37.90 (L) 08/21/2017   LDLCALC 105 (H) 08/21/2016   LDLDIRECT 125.0 08/21/2017   TRIG 220.0 (H) 08/21/2017   CHOLHDL 5 08/21/2017  On pravastatin added back in 08/2017 >> but feels hungry and "off" >> stopped several mo ago. Tried Zocor >> she did not like how she felt on it. - last eye exam was 03/2018: DR -+ Mild numbness and tingling in her feet.   She also has a history of MS (neuro Dr Kristin Mathews) - last 2 atacks: 02/2012, 2010; also HTN, fatty liver, endometriosis. She had a kidney stone in 11/2016 (calcium oxalate)- had litotripsy. She had a total of 20-25 kidney stones throughout her life. 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  She sees cardiology. She decreased Amlodipine 2/2 itching, leg swelling. She was started on Diuretic.  She travels a lot for her job.    ROS: Constitutional: no weight gain/no  weight loss, no fatigue, no subjective hyperthermia, no subjective hypothermia Eyes: no blurry vision, no xerophthalmia ENT: no sore throat, no nodules palpated in neck, no dysphagia, no odynophagia, no hoarseness Cardiovascular: no CP/no SOB/no palpitations/no leg swelling Respiratory: no cough/no SOB/no wheezing Gastrointestinal: no N/no V/no D/no C/no acid reflux Musculoskeletal: no muscle aches/no joint aches Skin: no rashes, no hair loss Neurological: no tremors/+ numbness/+ tingling/no dizziness  I reviewed pt's medications, allergies, PMH, social hx, family hx, and changes were documented in the history of present illness. Otherwise, unchanged from my initial visit  note.  Past Medical History:  Diagnosis Date  . Arthritis    mild  . Depression   . Diabetes mellitus    Type 2  . Endometriosis   . History of kidney stones   . Hypertension   . Multiple sclerosis ([place])   . Type II or unspecified type diabetes mellitus with peripheral circulatory disorders, uncontrolled(250.72) 11/27/2012   Past Surgical History:  Procedure Laterality Date  . EXPLORATORY LAPAROTOMY     endometriosis  . EXTRACORPOREAL SHOCK WAVE LITHOTRIPSY Right 11/19/2016   Procedure: RIGHT EXTRACORPOREAL SHOCK WAVE LITHOTRIPSY (ESWL);  Surgeon: Festus Aloe, MD;  Location: [facility];  Service: Urology;  Laterality: Right;  . LUMBAR DISC SURGERY     LS spine 2005 or so  . WISDOM TOOTH EXTRACTION     Social History   Socioeconomic History  . Marital status: Single    Spouse name: Not on file  . Number of children: Not on file  . Years of education: Not on file  . Highest education level: Not on file  Occupational History  . Not on file  Tobacco Use  . Smoking status: Never Smoker  . Smokeless tobacco: Never Used  Substance and Sexual Activity  . Alcohol use: Yes    Alcohol/week: 0.0 standard drinks    Comment: occasional  . Drug use: No  . Sexual activity: Not on file  Other Topics Concern  . Not on file  Social History Narrative   Single. Lives alone. No children. Pet cat.       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 Difficulty of Paying Living Expenses:   Food Insecurity:   . Worried About Charity fundraiser in the Last Year:   . Arboriculturist in the Last Year:   Transportation Needs:   . Film/video editor (Medical):   Marland Kitchen Lack of Transportation (Non-Medical):   Physical Activity:   . Days of Exercise per Week:   . Minutes of Exercise per Session:    Stress:   . Feeling of Stress :   Social Connections:   . Frequency of Communication with Friends and Family:   . Frequency of Social Gatherings with Friends and Family:   . Attends Religious Services:   . Active Member of Clubs or Organizations:   . Attends Archivist Meetings:   Marland Kitchen Marital Status:   Intimate Partner Violence:   . Fear of Current or Ex-Partner:   . Emotionally Abused:   Marland Kitchen Physically Abused:   . Sexually Abused:    Current Outpatient Medications on File Prior to Visit  Medication Sig Dispense Refill  . amLODipine (NORVASC) 5 MG tablet TAKE 1 TABLET  DAILY 180 tablet 3  . atorvastatin (LIPITOR) 40 MG tablet Take 1 tablet (40 mg total) by mouth daily. 90 tablet 4  . Blood Glucose Monitoring Suppl (FREESTYLE LITE) DEVI Use to check blood sugar twice a day 1 each 0  . carvedilol (COREG) 6.25 MG tablet Take 1 tablet (6.25 mg total) by mouth 2 (two) times daily. Travel to [place]. Please give 6 month supply 360 tablet 1  . cetirizine (ZYRTEC) 10 MG tablet Take 10 mg by mouth every evening.     . Continuous Blood Gluc Receiver (FREESTYLE LIBRE 14 DAY READER) DEVI 1 kit by Does not apply route See admin instructions. For continuous glucose monitoring 1 each 0  . Continuous Blood Gluc Sensor (FREESTYLE LIBRE 14 DAY SENSOR) MISC Change sensor every 14 days. 2 each 0  . DULoxetine (CYMBALTA) 60 MG capsule Take 1 capsule (60 mg total) by mouth daily. Travel to [place] for 6 months- please give 6 month supply 180 capsule 1  . fluticasone (FLONASE) 50 MCG/ACT nasal spray Place 2 sprays into both nostrils daily.    Marland Kitchen glipiZIDE (GLUCOTROL XL) 10 MG 24 hr tablet TAKE 1 TABLET DAILY WITH BREAKFAST 90 tablet 3  . glucose blood (FREESTYLE TEST STRIPS) test strip TEST BLOOD SUGAR TWICE A DAY. VARY TIMES AS DIRECTED. 200 each 3  . hydrochlorothiazide (MICROZIDE) 12.5 MG capsule TAKE 1 CAPSULE DAILY 180 capsule 0  . Insulin Disposable Pump (V-GO 40) KIT USE ONCE DAILY 90 kit 3  .  insulin lispro (HUMALOG) 100 UNIT/ML injection INJECT 76 UNITS UNDER THE SKIN ONCE AS DIRECTED WITH VGO 40 70 mL 3  . levonorgestrel-ethinyl estradiol (LYBREL,AMETHYST) 90-20 MCG tablet Take 1 tablet by mouth daily.     Marland Kitchen liraglutide (VICTOZA) 18 MG/3ML SOPN INJECT 1.8 MG UNDER THE SKIN DAILY 27 mL 3  . metFORMIN (GLUCOPHAGE-XR) 500 MG 24 hr tablet TAKE 2 TABLETS (1,000 MG TOTAL) TWICE A DAY WITH A MEAL 360 tablet 3  . modafinil (PROVIGIL) 200 MG tablet Take 1 tablet (200 mg total) by mouth daily as needed. 90 tablet 1  . Naproxen Sodium 220 MG CAPS Take 440 mg by mouth 2 (two) times daily as needed (for pain.).     Marland Kitchen natalizumab (TYSABRI) 300 MG/15ML injection Inject 300 mg into the vein every 30 (thirty) days.     No current facility-administered medications on file prior to visit.   Allergies  Allergen Reactions  . Ace Inhibitors Swelling    Edema   Family History  Problem Relation Age of Onset  . Breast cancer Mother        early 63s  . Diabetes Mother        maternal grandmother as well  . Diabetes Father   . Other Father        progressive supranuclear palsy    PE: BP 110/60   Pulse 90   Ht '5\' 6"'  (1.676 m)   Wt 246 lb (111.6 kg)   SpO2 99%   BMI 39.71 kg/m  Body mass index is 39.71 kg/m. Wt Readings from Last 3 Encounters:  03/30/19 246 lb (111.6 kg)  10/02/18 244 lb 8 oz (110.9 kg)  06/04/18 240 lb (108.9 kg)   Constitutional: overweight, in NAD Eyes: PERRLA, EOMI, no exophthalmos ENT: moist mucous membranes, no thyromegaly, no cervical lymphadenopathy Cardiovascular: RRR, No RG, +1/6 SEM Respiratory: CTA B Gastrointestinal: abdomen soft, NT, ND, BS+ Musculoskeletal: no deformities, strength intact in all 4 Skin: moist, warm, no rashes Neurological: no  tremor with outstretched hands, DTR normal in all 4  ASSESSMENT: 1. DM2, insulin-dependent, uncontrolled, with hyperglycemia  She saw the diabetes educator for a discussion about insulin pumps >> decided for  a VGo  2. Obesity class 2 BMI Classification:  < 18.5 underweight   18.5-24.9 normal weight   25.0-29.9 overweight   30.0-34.9 class I obesity   35.0-39.9 class II obesity   ? 40.0 class III obesity   3. HL  PLAN:  1. Patient with history of uncontrolled type 2 diabetes exacerbated by steroid courses for MS and also by her extensive traveling for work in the past.  She continues on the Vgo 40 patch pump, daily GLP-1 receptor and also oral medication with Metformin and sulfonylurea.  We added SGLT2 inhibitor at last visit.  At that time, HbA1c was higher, at 8.4%.  She only had few sugars in her log and they were fluctuating.  I advised her to check sugars consistently.  However, afterwards, she was lost for follow-up for almost a year during the coronavirus pandemic. -At this visit, we reviewed together her CGM downloads (we will scanned them).  There is a clear trend of very high blood sugars after dinner which then take the entire night to decrease.  In the morning, her blood sugars are at goal.  The sugars then increase after breakfast and also many times after lunch, but not as much as after dinner.  She is eating carb controlled meals for breakfast and lunch, and also protein shakes but at night she eats home-cooked meals, without reducing carbs. -For now, we discussed about switching from Victoza to [place], which is not covered by her insurance.  We will use a high dose of Ozempic, though 1 mg weekly, and I think that her sugars would improve with this.  We will continue with the Vgo 40 for now, but I did advise her to supplement her dinner boluses with extra Humalog.  I sent a prescription for Humalog pens to her pharmacy.  I did advise her that she may not need too much Humalog with dinner after she started Ozempic, but most likely she will need to still use the Humalog pens. -  I advised her to:  Patient Instructions  Please continue: - Metformin ER 1000 mg 2x a dayt - Glipizide  XL 10 mg in am - Victoza 1.8 mg daily in a.m. - VGo 40 - 7-09 clicks per meal  When you finish Victoza, start: - Ozempic 1 mg weekly, e.g. on Sunday am  Try to start: - Humalog 18-25 units before dinner.  Please return in 3 months with your sugar log.   - we checked her HbA1c: 8.7% (higher) - advised to check sugars at different times of the day - 4x a day, rotating check times - advised for yearly eye exams >> she is UTD, but needs another one soon - return to clinic in 3-4 months   2. Obesity class 2 -She gained 10 pounds before last visit and approximately 6 pounds since last visit. -We will continue GLP-1 receptor agonist and also the SGLT2 inhibitor started at last visit, which will also help with weight loss  3. HL - Reviewed latest lipid panel from 08/2017: LDL above target, worse; triglycerides high, HDL low Lab Results  Component Value Date   CHOL 179 08/21/2017   HDL 37.90 (L) 08/21/2017   LDLCALC 105 (H) 08/21/2016   LDLDIRECT 125.0 08/21/2017   TRIG 220.0 (H) 08/21/2017   CHOLHDL 5  08/21/2017  -At last visit she was on pravastatin as she did not like how it made her feel.  She also tried Zocor with the same effect in the past.  At last visit, she agreed to try Lipitor 40 mg and I sent this to her pharmacy. She is taking this now.   Component     Latest Ref Rng & Units 03/30/2019  Sodium     135 - 145 mEq/L 138  Potassium     3.5 - 5.1 mEq/L 3.9  Chloride     96 - 112 mEq/L 99  CO2     19 - 32 mEq/L 29  Glucose     70 - 99 mg/dL 162 (H)  BUN     6 - 23 mg/dL 17  Creatinine     0.40 - 1.20 mg/dL 0.99  Total Bilirubin     0.2 - 1.2 mg/dL 0.5  Alkaline Phosphatase     39 - 117 U/L 76  AST     0 - 37 U/L 30  ALT     0 - 35 U/L 32  Total Protein     6.0 - 8.3 g/dL 6.9  Albumin     3.5 - 5.2 g/dL 3.8  Calcium     8.4 - 10.5 mg/dL 9.6  GFR     >60.00 mL/min 59.45 (L)  Cholesterol     0 - 200 mg/dL 100  Triglycerides     0.0 - 149.0 mg/dL 177.0  (H)  HDL Cholesterol     >39.00 mg/dL 31.90 (L)  VLDL     0.0 - 40.0 mg/dL 35.4  LDL (calc)     0 - 99 mg/dL 33  Total CHOL/HDL Ratio      3  NonHDL      67.98  Microalb, Ur     0.0 - 1.9 mg/dL 7.1 (H)  Creatinine,U     mg/dL 163.0  MICROALB/CREAT RATIO     0.0 - 30.0 mg/g 4.3   Lipids improved.  [name], MD PhD [facility] Endocrinology

## 2019-04-01 ENCOUNTER — Ambulatory Visit: Payer: BC Managed Care – PPO | Admitting: Family Medicine

## 2019-04-01 ENCOUNTER — Encounter: Payer: Self-pay | Admitting: Family Medicine

## 2019-04-01 DIAGNOSIS — Z79899 Other long term (current) drug therapy: Secondary | ICD-10-CM | POA: Insufficient documentation

## 2019-04-01 NOTE — Progress Notes (Deleted)
PATIENT: Kristin Mathews DOB: [birth year]  REASON FOR VISIT: follow up HISTORY FROM: patient  No chief complaint on file.    HISTORY OF PRESENT ILLNESS: Today 04/01/19 Kristin Mathews is a 49 y.o. female here today for follow up for RRMS. She continues Tysabri infusions. She continues modafinil daily   HISTORY: (copied from Dr Garth Bigness note on 10/02/2018)  Kristin Mathews is a 49 y.o. woman with relapsing remitting MS dx 2001.  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  She tolerates it well and has not had any exacerbations.  She was JCV antibody negative when last checked 12/31/2017.   We discussed CoVid-19 risks and CDC guidance.  Gait is doing well for the most part though balance is off and she uses the banister to go up and down stairs.  Strength and sensation is fine in the legs.  She denies any difficulty with her bladder.  Vision function is fine.  She notes more fatigue and a couple days needed to take a nap.    She is sleeping well most nights since starting melatonin.  She denies any difficulties with cognition.  Her mood is fine.  Update 12/31/2017: She has been on Tysabri for about 5 years.  She tolerated well and has not had any exacerbations.   Her next Tysabri infusion will be 01/12/2018.  She is likely going to be out of the country for 4 to 6 months starting in April for work.  We had a conversation about her MS disease modifying therapy options.  She has mild spasticity in her limbs if sitting/laying a long time and getting up.  No difficulty with gait though balance is slightly off on stairs and she uses the bannister.   Strength and sensation are fine.   Bladder is fine.  Vision is fine.   She notes fatigue (often work related).     Mood is usually ok.   Cognition is fine.     From 04/17/2016: She presented with unilateral numbness in May 2001 and was diagnosed with transverse myelitis.    MRI only showed one spot  and an LP was non-diagnostic.   She had another episode of numbness 6 months later and MRI showed several newer lesions.    She was placed on REbif as part of the Rebiject study.     She had an exacerbation in 2007 and in 2010 with numbness and then optic neuritis.   In 2015, she had another numbness exacerbation and we decided to switch to Tysabri.   She has had 3 doses, last dose was 12/1/8/205.    Insurance Nurse, mental health) will not cover Tysabri in a hospital outpt center and we are trying to find out how best to continue her infusions.     She tolerates Tysabri well.  Gait/strength/sensation   She notes mild spasticity but no weakness.   Gait is fine but she feels off balanced if she stands on a chair or ladder.   She fell and hit her head once while changing a smoke alarm battery.  Her tingling is mostly in the toes and bottoms of her feet.    Bladder:    She has bowel frequency and rare incontinence, especially if sugars are high.   Rare bowel incontinence, also if sugars very high  Vision:   She notes mild visual blurring.   She has been told in the past that her allergies are affecting her vision.  Blurriness improves after she is up a while.   She is going to see ophtho.     Fatigue is often a problem.   Thi is physical > mental.    She has poor sleep with a lot of nocturnal awakenings.    Usually she falls back asleep when she wakes up.    She snores.   No reports of apnea.  She is sleepy during the day, helped byu Provigil  EPWORTH SLEEPINESS SCALE  On a scale of 0 - 3 what is the chance of dozing:  Sitting and Reading:                           3 Watching TV:                                      3 Sitting inactive in a public place:        0 Passenger in car for one hour:           3 Lying down to rest in the afternoon:   3 Sitting and talking to someone:          0 Sitting quietly after lunch:                   3 In a car, stopped in traffic:                  0  Total (out of 24):     15/24 (moderate sleepiness)  Mood:   She denies any difficulty with depression or anxiety.   No cognitive issues   REVIEW OF SYSTEMS: Out of a complete 14 system review of symptoms, the patient complains only of the following symptoms, and all other reviewed systems are negative.  ALLERGIES: Allergies  Allergen Reactions  . Ace Inhibitors Swelling    Edema    HOME MEDICATIONS: Outpatient Medications Prior to Visit  Medication Sig Dispense Refill  . amLODipine (NORVASC) 5 MG tablet TAKE 1 TABLET DAILY 180 tablet 3  . atorvastatin (LIPITOR) 40 MG tablet Take 1 tablet (40 mg total) by mouth daily. 90 tablet 4  . Blood Glucose Monitoring Suppl (FREESTYLE LITE) DEVI Use to check blood sugar twice a day 1 each 0  . carvedilol (COREG) 6.25 MG tablet Take 1 tablet (6.25 mg total) by mouth 2 (two) times daily. Travel to [place]. Please give 6 month supply 360 tablet 1  . cetirizine (ZYRTEC) 10 MG tablet Take 10 mg by mouth every evening.     . Continuous Blood Gluc Receiver (FREESTYLE LIBRE 14 DAY READER) DEVI 1 kit by Does not apply route See admin instructions. For continuous glucose monitoring 1 each 0  . Continuous Blood Gluc Sensor (FREESTYLE LIBRE 14 DAY SENSOR) MISC Change sensor every 14 days. 6 each 3  . DULoxetine (CYMBALTA) 60 MG capsule Take 1 capsule (60 mg total) by mouth daily. Travel to [place] for 6 months- please give 6 month supply 180 capsule 1  . fluticasone (FLONASE) 50 MCG/ACT nasal spray Place 2 sprays into both nostrils daily.    Marland Kitchen glipiZIDE (GLUCOTROL XL) 10 MG 24 hr tablet TAKE 1 TABLET DAILY WITH BREAKFAST 90 tablet 3  . glucose blood (FREESTYLE TEST STRIPS) test strip TEST BLOOD SUGAR TWICE A DAY. VARY TIMES AS DIRECTED. 200 each 3  . hydrochlorothiazide (MICROZIDE) 12.5  MG capsule TAKE 1 CAPSULE DAILY 180 capsule 0  . Insulin Disposable Pump (V-GO 40) KIT USE ONCE DAILY 90 kit 3  . insulin lispro (HUMALOG KWIKPEN) 200 UNIT/ML KwikPen Inject 18-25 units before  dinner, under skin 15 mL 5  . insulin lispro (HUMALOG) 100 UNIT/ML injection INJECT 76 UNITS UNDER THE SKIN ONCE AS DIRECTED WITH VGO 40 70 mL 3  . Insulin Pen Needle 32G X 4 MM MISC Use 1x a day 100 each 3  . levonorgestrel-ethinyl estradiol (LYBREL,AMETHYST) 90-20 MCG tablet Take 1 tablet by mouth daily.     Marland Kitchen liraglutide (VICTOZA) 18 MG/3ML SOPN INJECT 1.8 MG UNDER THE SKIN DAILY 27 mL 3  . metFORMIN (GLUCOPHAGE-XR) 500 MG 24 hr tablet TAKE 2 TABLETS (1,000 MG TOTAL) TWICE A DAY WITH A MEAL 360 tablet 3  . modafinil (PROVIGIL) 200 MG tablet Take 1 tablet (200 mg total) by mouth daily as needed. 90 tablet 1  . Naproxen Sodium 220 MG CAPS Take 440 mg by mouth 2 (two) times daily as needed (for pain.).     Marland Kitchen natalizumab (TYSABRI) 300 MG/15ML injection Inject 300 mg into the vein every 30 (thirty) days.    . Semaglutide, 1 MG/DOSE, (OZEMPIC, 1 MG/DOSE,) 2 MG/1.5ML SOPN Inject 1 mg into the skin once a week. 2 pen 5   No facility-administered medications prior to visit.    PAST MEDICAL HISTORY: Past Medical History:  Diagnosis Date  . Arthritis    mild  . Depression   . Diabetes mellitus    Type 2  . Endometriosis   . History of kidney stones   . Hypertension   . Multiple sclerosis ([place])   . Type II or unspecified type diabetes mellitus with peripheral circulatory disorders, uncontrolled(250.72) 11/27/2012    PAST SURGICAL HISTORY: Past Surgical History:  Procedure Laterality Date  . EXPLORATORY LAPAROTOMY     endometriosis  . EXTRACORPOREAL SHOCK WAVE LITHOTRIPSY Right 11/19/2016   Procedure: RIGHT EXTRACORPOREAL SHOCK WAVE LITHOTRIPSY (ESWL);  Surgeon: Festus Aloe, MD;  Location: [facility];  Service: Urology;  Laterality: Right;  . LUMBAR DISC SURGERY     LS spine 2005 or so  . WISDOM TOOTH EXTRACTION      FAMILY HISTORY: Family History  Problem Relation Age of Onset  . Breast cancer Mother        early 39s  . Diabetes Mother        maternal grandmother as well  .  Diabetes Father   . Other Father        progressive supranuclear palsy    SOCIAL HISTORY: Social History   Socioeconomic History  . Marital status: Single    Spouse name: Not on file  . Number of children: Not on file  . Years of education: Not on file  . Highest education level: Not on file  Occupational History  . Not on file  Tobacco Use  . Smoking status: Never Smoker  . Smokeless tobacco: Never Used  Substance and Sexual Activity  . Alcohol use: Yes    Alcohol/week: 0.0 standard drinks    Comment: occasional  . Drug use: No  . Sexual activity: Not on file  Other Topics Concern  . Not on file  Social History Narrative   Single. Lives alone. No children. Pet cat.       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 Difficulty of Paying Living Expenses:   Food Insecurity:   . Worried About Charity fundraiser in the Last Year:   . Arboriculturist in the Last Year:   Transportation Needs:   . Film/video editor (Medical):   Marland Kitchen Lack of Transportation (Non-Medical):   Physical Activity:   . Days of Exercise per Week:   . Minutes of Exercise per Session:   Stress:   . Feeling of Stress :   Social Connections:   . Frequency of Communication with Friends and Family:   . Frequency of Social Gatherings with Friends and Family:   . Attends Religious Services:   . Active Member of Clubs or Organizations:   . Attends Archivist Meetings:   Marland Kitchen Marital Status:   Intimate Partner Violence:   . Fear of Current or Ex-Partner:   . Emotionally Abused:   Marland Kitchen Physically Abused:   . Sexually Abused:       PHYSICAL EXAM  There were no vitals filed for this visit. There is no height or weight on file to calculate BMI.  Generalized: Well developed, in no acute distress    Cardiology: normal rate and rhythm, no murmur noted Neurological examination  Mentation: Alert oriented to time, place, history taking. Follows all commands speech and language fluent Cranial nerve II-XII: Pupils were equal round reactive to light. Extraocular movements were full, visual field were full on confrontational test. Facial sensation and strength were normal. Uvula tongue midline. Head turning and shoulder shrug  were normal and symmetric. Motor: The motor testing reveals 5 over 5 strength of all 4 extremities. Good symmetric motor tone is noted throughout.  Sensory: Sensory testing is intact to soft touch on all 4 extremities. No evidence of extinction is noted.  Coordination: Cerebellar testing reveals good finger-nose-finger and heel-to-shin bilaterally.  Gait and station: Gait is normal. Tandem gait is normal. Romberg is negative. No drift is seen.  Reflexes: Deep tendon reflexes are symmetric and normal bilaterally.   DIAGNOSTIC DATA (LABS, IMAGING, TESTING) - I reviewed patient records, labs, notes, testing and imaging myself where available.  No flowsheet data found.   Lab Results  Component Value Date   WBC 12.7 (H) 10/02/2018   HGB 14.1 10/02/2018   HCT 43.5 10/02/2018   MCV 86 10/02/2018   PLT 374 10/02/2018      Component Value Date/Time   NA 138 03/30/2019 1404   NA 138 12/31/2017 1421   K 3.9 03/30/2019 1404   CL 99 03/30/2019 1404   CO2 29 03/30/2019 1404   GLUCOSE 162 (H) 03/30/2019 1404   BUN 17 03/30/2019 1404   BUN 14 12/31/2017 1421   CREATININE 0.99 03/30/2019 1404   CREATININE 0.73 04/30/2014 1109   CALCIUM 9.6 03/30/2019 1404   PROT 6.9 03/30/2019 1404   PROT 6.6 12/31/2017 1421   ALBUMIN 3.8 03/30/2019 1404   ALBUMIN 4.2 12/31/2017 1421   AST 30 03/30/2019 1404   ALT 32 03/30/2019 1404   ALKPHOS 76 03/30/2019 1404   BILITOT 0.5 03/30/2019 1404   BILITOT 0.2 12/31/2017 1421   GFRNONAA 78 12/31/2017 1421   GFRNONAA >89 04/30/2014 1109    GFRAA 90 12/31/2017 1421   GFRAA >89 04/30/2014 1109   Lab Results  Component Value Date   CHOL 100 03/30/2019   HDL 31.90 (L) 03/30/2019   [place]  33 03/30/2019   LDLDIRECT 125.0 08/21/2017   TRIG 177.0 (H) 03/30/2019   CHOLHDL 3 03/30/2019   Lab Results  Component Value Date   HGBA1C 8.7 (A) 03/30/2019   No results found for: [id number] Lab Results  Component Value Date   TSH 2.97 04/22/2018       ASSESSMENT AND PLAN 49 y.o. year old female  has a past medical history of Arthritis, Depression, Diabetes mellitus, Endometriosis, History of kidney stones, Hypertension, Multiple sclerosis ([place]), and Type II or unspecified type diabetes mellitus with peripheral circulatory disorders, uncontrolled(250.72) (11/27/2012). here with ***    ICD-10-CM   1. Relapsing remitting multiple sclerosis ([place])  G35   2. High risk medication use  Z79.899        No orders of the defined types were placed in this encounter.    No orders of the defined types were placed in this encounter.     I spent 15 minutes with the patient. 50% of this time was spent counseling and educating patient on plan of care and medications.    Debbora Presto, FNP-C 04/01/2019, 12:31 PM [facility] [address], [place], [place] [address] [phone]

## 2019-04-06 DIAGNOSIS — G35 Multiple sclerosis: Secondary | ICD-10-CM | POA: Diagnosis not present

## 2019-04-13 ENCOUNTER — Telehealth: Payer: Self-pay | Admitting: *Deleted

## 2019-04-13 NOTE — Telephone Encounter (Addendum)
Called, LVM for pt to call office. She is past due for lab work. She will need to complete at next infusion per Dr. Felecia Shelling. We also needs to get a f/u scheduled with AL,NP for her next available. She no showed her f/u with her on 04/01/19. In order to safely continue her on Tysbari, we need an updated visit.   Faxed completed/signed Tysabri pt status report and reauth questionnaire to [facility] touch at [phone]. Received confirmation.  Received fax back from touch prescribing program that pt re-authorized from 04/12/2019-11/13/2019. Pt enrollment number: [id number]. Account: [facility]. Site auth number: [id number].

## 2019-04-14 NOTE — Telephone Encounter (Signed)
Called, LVM for pt to call office again

## 2019-04-16 ENCOUNTER — Encounter: Payer: Self-pay | Admitting: *Deleted

## 2019-04-16 NOTE — Telephone Encounter (Addendum)
Tried calling pt again at [phone]. Went to VM. Left message for pt to call. Also called mother, Crusita Pille (on [place]) at [phone] to call office back or to have [place] call our office back since we are unable to reach [place]

## 2019-04-16 NOTE — Telephone Encounter (Signed)
Sent pt letter since unreachable by phone. 

## 2019-04-20 ENCOUNTER — Other Ambulatory Visit: Payer: Self-pay | Admitting: Family Medicine

## 2019-04-22 DIAGNOSIS — Z0289 Encounter for other administrative examinations: Secondary | ICD-10-CM

## 2019-04-22 NOTE — Telephone Encounter (Signed)
Took call from phone staff and spoke with pt. She scheduled f/u with AL,NP for 06/10/19 at [place] she is past due for labs and needs to complete prior to this appt. She has next infusion 05/04/19 and will plan on getting labs when she comes that day. Orders are already in. I let infusion suite know as well.

## 2019-05-04 ENCOUNTER — Telehealth: Payer: Self-pay | Admitting: *Deleted

## 2019-05-04 ENCOUNTER — Other Ambulatory Visit: Payer: Self-pay | Admitting: Neurology

## 2019-05-04 DIAGNOSIS — G35 Multiple sclerosis: Secondary | ICD-10-CM

## 2019-05-04 DIAGNOSIS — Z79899 Other long term (current) drug therapy: Secondary | ICD-10-CM

## 2019-05-04 NOTE — Addendum Note (Signed)
Addended by: Inis Sizer D on: 05/04/2019 01:09 PM   Modules accepted: Orders

## 2019-05-04 NOTE — Telephone Encounter (Signed)
Placed JCV lab in [facility] lock box for routine lab pick up. Results pending. 

## 2019-05-05 LAB — CBC WITH DIFFERENTIAL/PLATELET
Basophils Absolute: 0.1 10*3/uL (ref 0.0–0.2)
Basos: 1 %
EOS (ABSOLUTE): 0.7 10*3/uL — ABNORMAL HIGH (ref 0.0–0.4)
Eos: 7 %
Hematocrit: 43.8 % (ref 34.0–46.6)
Hemoglobin: 14.2 g/dL (ref 11.1–15.9)
Immature Grans (Abs): 0.1 10*3/uL (ref 0.0–0.1)
Immature Granulocytes: 1 %
Lymphocytes Absolute: 3.8 10*3/uL — ABNORMAL HIGH (ref 0.7–3.1)
Lymphs: 41 %
MCH: 29.4 pg (ref 26.6–33.0)
MCHC: 32.4 g/dL (ref 31.5–35.7)
MCV: 91 fL (ref 79–97)
Monocytes Absolute: 0.7 10*3/uL (ref 0.1–0.9)
Monocytes: 7 %
NRBC: 1 % — ABNORMAL HIGH (ref 0–0)
Neutrophils Absolute: 4 10*3/uL (ref 1.4–7.0)
Neutrophils: 43 %
Platelets: 282 10*3/uL (ref 150–450)
RBC: 4.83 x10E6/uL (ref 3.77–5.28)
RDW: 13.6 % (ref 11.7–15.4)
WBC: 9.3 10*3/uL (ref 3.4–10.8)

## 2019-05-18 NOTE — Telephone Encounter (Signed)
JCV ab drawn on 05/04/19 indeterminate, index: 0.39. Inhibition assay: positive.

## 2019-05-20 ENCOUNTER — Other Ambulatory Visit: Payer: Self-pay | Admitting: Family Medicine

## 2019-05-21 ENCOUNTER — Telehealth: Payer: Self-pay | Admitting: Neurology

## 2019-05-21 NOTE — Telephone Encounter (Signed)
Called and got VM.   I will try to call again tomorrow

## 2019-05-27 DIAGNOSIS — J310 Chronic rhinitis: Secondary | ICD-10-CM | POA: Diagnosis not present

## 2019-05-27 DIAGNOSIS — J301 Allergic rhinitis due to pollen: Secondary | ICD-10-CM | POA: Diagnosis not present

## 2019-05-27 DIAGNOSIS — J3081 Allergic rhinitis due to animal (cat) (dog) hair and dander: Secondary | ICD-10-CM | POA: Diagnosis not present

## 2019-05-27 DIAGNOSIS — J3089 Other allergic rhinitis: Secondary | ICD-10-CM | POA: Diagnosis not present

## 2019-06-01 DIAGNOSIS — G35 Multiple sclerosis: Secondary | ICD-10-CM | POA: Diagnosis not present

## 2019-06-01 NOTE — Telephone Encounter (Signed)
I called again and got voicemail.  The mailbox was full so I could not leave a message.  I will try to reach her again later this week.

## 2019-06-10 ENCOUNTER — Ambulatory Visit: Payer: BC Managed Care – PPO | Admitting: Family Medicine

## 2019-06-17 ENCOUNTER — Other Ambulatory Visit: Payer: Self-pay

## 2019-06-17 MED ORDER — HUMALOG KWIKPEN 200 UNIT/ML ~~LOC~~ SOPN: PEN_INJECTOR | SUBCUTANEOUS | 5 refills | Status: DC

## 2019-06-17 MED ORDER — OZEMPIC (1 MG/DOSE) 2 MG/1.5ML ~~LOC~~ SOPN: 1.0000 mg | PEN_INJECTOR | SUBCUTANEOUS | 3 refills | Status: DC

## 2019-06-23 ENCOUNTER — Other Ambulatory Visit: Payer: Self-pay

## 2019-06-23 MED ORDER — OZEMPIC (1 MG/DOSE) 2 MG/1.5ML ~~LOC~~ SOPN: 1.0000 mg | PEN_INJECTOR | SUBCUTANEOUS | 3 refills | Status: DC

## 2019-06-29 ENCOUNTER — Encounter: Payer: Self-pay | Admitting: Internal Medicine

## 2019-06-29 ENCOUNTER — Ambulatory Visit: Payer: BC Managed Care – PPO | Admitting: Internal Medicine

## 2019-06-29 ENCOUNTER — Other Ambulatory Visit: Payer: Self-pay

## 2019-06-29 VITALS — BP 132/86 | HR 94 | Ht 66.0 in | Wt 242.0 lb

## 2019-06-29 DIAGNOSIS — E1151 Type 2 diabetes mellitus with diabetic peripheral angiopathy without gangrene: Secondary | ICD-10-CM | POA: Diagnosis not present

## 2019-06-29 DIAGNOSIS — E785 Hyperlipidemia, unspecified: Secondary | ICD-10-CM

## 2019-06-29 DIAGNOSIS — IMO0002 Reserved for concepts with insufficient information to code with codable children: Secondary | ICD-10-CM

## 2019-06-29 DIAGNOSIS — G35 Multiple sclerosis: Secondary | ICD-10-CM | POA: Diagnosis not present

## 2019-06-29 DIAGNOSIS — E669 Obesity, unspecified: Secondary | ICD-10-CM | POA: Diagnosis not present

## 2019-06-29 DIAGNOSIS — E1165 Type 2 diabetes mellitus with hyperglycemia: Secondary | ICD-10-CM

## 2019-06-29 LAB — POCT GLYCOSYLATED HEMOGLOBIN (HGB A1C): Hemoglobin A1C: 7.4 % — AB (ref 4.0–5.6)

## 2019-06-29 NOTE — Addendum Note (Signed)
Addended by: Cardell Peach I on: 06/29/2019 01:43 PM   Modules accepted: Orders

## 2019-06-29 NOTE — Patient Instructions (Addendum)
Please continue: - Metformin ER 1000 mg 2x a dayt - Glipizide XL 10 mg in am - VGo 40 - 9-40 clicks per meal - Humalog 6 units before a large dinner - Ozempic 1 mg weekly  Please return in 3-4 months.Marland Kitchen

## 2019-06-29 NOTE — Progress Notes (Signed)
Patient ID: Kristin Mathews, female   DOB: 13-May-1970, 49 y.o.   MRN: [id number]  This visit occurred during the SARS-CoV-2 public health emergency.  Safety protocols were in place, including screening questions prior to the visit, additional usage of staff PPE, and extensive cleaning of exam room while observing appropriate contact time as indicated for disinfecting solutions.   HPI: Kristin Mathews is a 49 y.o.-year-old female, presenting for f/u for DM2, dx 2009, insulin-dependent since 2009, uncontrolled, without long term complications. Last visit 3 months ago.  Reviewed HbA1c levels: Lab Results  Component Value Date   HGBA1C 8.7 (A) 03/30/2019   HGBA1C 8.4 (A) 04/22/2018   HGBA1C 7.9 (A) 10/07/2017   HGBA1C 9 01/31/2017   HGBA1C 8.3 (H) 08/21/2016   HGBA1C 7.6 01/23/2016   HGBA1C 9.0 09/26/2015   HGBA1C 8.5 (H) 02/22/2015   HGBA1C 8.2 08/03/2014   HGBA1C 8.6 (H) 04/30/2014   HGBA1C 9.3 (H) 11/02/2013   HGBA1C 7.1 (H) 04/14/2013   HGBA1C 8.0 (H) 11/12/2012   HGBA1C 7.3 (H) 05/13/2012   HGBA1C 6.8 (H) 08/21/2011   HGBA1C 6.5 11/15/2010   HGBA1C 7.3 (H) 07/04/2010   HGBA1C 8.1 (H) 02/17/2010   HGBA1C 7.3 (H) 10/13/2009   HGBA1C 7.4 (H) 06/07/2009   HGBA1C 8.4 (H) 01/18/2009   HGBA1C 6.6 (H) 06/29/2008   HGBA1C 6.6 (H) 02/25/2008   HGBA1C 8.3 (H) 08/29/2006   She uses frequent steroid courses for MS.  She is on: - Metformin ER 1000 mg 2x a day - Glipizide XL 10 mg in am - Victoza 1.8 mg daily in a.m. >> Ozempic 1 mg weekly - VGo 40 - 1 click for 50P >> 10 g carbs >> 5-46 clicks per meal ( 1 FKCLE/75 g carbs) - Humalog 18 to 25 units before dinner >> actually only takes 6 units occasionally She was previously on Bydureon. She was on Invokana 100 >> 300 >> had few yeast infections - stopped in 05/2013 and tried again in 2017 >> stopped again b/c yeast inf. She tried [place] but stopped because yeast infection 04/2018 We stopped Humulin 70/30 25 units bid. Januvia - did not  help.  Glumetza not covered by insurance.  She checks her sugars more than 4 times a day with her freestyle libre CGM:  Freestyle libre CGM parameters: - Average: 155 >> 140 - % active CGM time:  41% of the time >> 74% - Glucose variability 38.8%(target < or = to 36%) >> 28.3% - time in range:  - very low (<54): 0% >> 1% - low (54-69): 1% >> 2% - normal range (70-180): 70% >> 78% - high sugars (181-250): 18% >> 19% - very high sugars (>250): 11% >> 0%    Lowest CBG: 21!! (at night) ... >> 83 >> 54 >> 80s >> LO (night); she has hypoglycemia awareness in the 50s. Highest 276 >> 260 (forgot to bolus) >> 287 >> 250 >> 250.  Pt's meals are: - Breakfast: yoghurt + granola, cereals  - Lunch: sandwich, sushi, [place]  - Dinner: home cooked meal: stews, etc. - Snacks: 3-4   -No CKD, last BUN/creatinine:  Lab Results  Component Value Date   BUN 17 03/30/2019   CREATININE 0.99 03/30/2019  She was previously on benazepril but developed angioedema from ACE inhibitors in 2014. -+ HL; last set of lipids: Lab Results  Component Value Date   CHOL 100 03/30/2019   HDL 31.90 (L) 03/30/2019   LDLCALC 33 03/30/2019   LDLDIRECT 125.0  08/21/2017   TRIG 177.0 (H) 03/30/2019   CHOLHDL 3 03/30/2019  On pravastatin added back in 08/2017 >> but feels hungry and "off" >> stopped several mo ago. Tried Zocor >> she did not like how she felt on it.  However, she now tolerates well Lipitor 40. - last eye exam was 03/2018: No DR -+ Mild numbness and tingling in her feet.   She also has a history of MS (neuro Dr Arlice Colt) - last 2 atacks: 02/2012, 2010; also HTN, fatty liver, endometriosis. She had a kidney stone in 11/2016 (calcium oxalate)- had litotripsy. She had a total of 20-25 kidney stones throughout her life. 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  She sees cardiology. She decreased Amlodipine 2/2 itching, leg swelling. She was started on  Diuretic.  She travels a lot for her job.    ROS: Constitutional: no weight gain/+ weight loss, no fatigue, no subjective hyperthermia, no subjective hypothermia Eyes: no blurry vision, no xerophthalmia, + Palpebral swelling B ENT: no sore throat, no nodules palpated in neck, no dysphagia, no odynophagia, no hoarseness Cardiovascular: no CP/no SOB/no palpitations/no leg swelling Respiratory: no cough/no SOB/no wheezing Gastrointestinal: no N/no V/no D/no C/no acid reflux Musculoskeletal: no muscle aches/no joint aches Skin: no rashes, no hair loss Neurological: no tremors/+ numbness/+ tingling/no dizziness  I reviewed pt's medications, allergies, PMH, social hx, family hx, and changes were documented in the history of present illness. Otherwise, unchanged from my initial visit note.  Past Medical History:  Diagnosis Date  . Arthritis    mild  . Depression   . Diabetes mellitus    Type 2  . Endometriosis   . History of kidney stones   . Hypertension   . Multiple sclerosis ([place])   . Type II or unspecified type diabetes mellitus with peripheral circulatory disorders, uncontrolled(250.72) 11/27/2012   Past Surgical History:  Procedure Laterality Date  . EXPLORATORY LAPAROTOMY     endometriosis  . EXTRACORPOREAL SHOCK WAVE LITHOTRIPSY Right 11/19/2016   Procedure: RIGHT EXTRACORPOREAL SHOCK WAVE LITHOTRIPSY (ESWL);  Surgeon: Festus Aloe, MD;  Location: [facility];  Service: Urology;  Laterality: Right;  . LUMBAR DISC SURGERY     LS spine 2005 or so  . WISDOM TOOTH EXTRACTION     Social History   Socioeconomic History  . Marital status: Single    Spouse name: Not on file  . Number of children: Not on file  . Years of education: Not on file  . Highest education level: Not on file  Occupational History  . Not on file  Tobacco Use  . Smoking status: Never Smoker  . Smokeless tobacco: Never Used  Substance and Sexual Activity  . Alcohol use: Yes    Alcohol/week: 0.0  standard drinks    Comment: occasional  . Drug use: No  . Sexual activity: Not on file  Other Topics Concern  . Not on file  Social History Narrative   Single. Lives alone. No children. Pet cat.       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 Difficulty of Paying Living Expenses:   Food Insecurity:   . Worried About Charity fundraiser in the Last Year:   .  Ran Out of Food in the Last Year:   Transportation Needs:   . Film/video editor (Medical):   Marland Kitchen Lack of Transportation (Non-Medical):   Physical Activity:   . Days of Exercise per Week:   . Minutes of Exercise per Session:   Stress:   . Feeling of Stress :   Social Connections:   . Frequency of Communication with Friends and Family:   . Frequency of Social Gatherings with Friends and Family:   . Attends Religious Services:   . Active Member of Clubs or Organizations:   . Attends Archivist Meetings:   Marland Kitchen Marital Status:   Intimate Partner Violence:   . Fear of Current or Ex-Partner:   . Emotionally Abused:   Marland Kitchen Physically Abused:   . Sexually Abused:    Current Outpatient Medications on File Prior to Visit  Medication Sig Dispense Refill  . amLODipine (NORVASC) 5 MG tablet TAKE 1 TABLET DAILY 180 tablet 3  . atorvastatin (LIPITOR) 40 MG tablet Take 1 tablet (40 mg total) by mouth daily. 90 tablet 4  . Blood Glucose Monitoring Suppl (FREESTYLE LITE) DEVI Use to check blood sugar twice a day 1 each 0  . carvedilol (COREG) 6.25 MG tablet Take 1 tablet (6.25 mg total) by mouth 2 (two) times daily. Please schedule appt with Dr. Yong Channel for further refills [phone] 60 tablet 0  . cetirizine (ZYRTEC) 10 MG tablet Take 10 mg by mouth every evening.     . Continuous Blood Gluc Receiver (FREESTYLE LIBRE 14 DAY READER)  DEVI 1 kit by Does not apply route See admin instructions. For continuous glucose monitoring 1 each 0  . Continuous Blood Gluc Sensor (FREESTYLE LIBRE 14 DAY SENSOR) MISC Change sensor every 14 days. 6 each 3  . DULoxetine (CYMBALTA) 60 MG capsule TAKE 1 CAPSULE DAILY 180 capsule 0  . fluticasone (FLONASE) 50 MCG/ACT nasal spray Place 2 sprays into both nostrils daily.    Marland Kitchen glipiZIDE (GLUCOTROL XL) 10 MG 24 hr tablet TAKE 1 TABLET DAILY WITH BREAKFAST 90 tablet 3  . glucose blood (FREESTYLE TEST STRIPS) test strip TEST BLOOD SUGAR TWICE A DAY. VARY TIMES AS DIRECTED. 200 each 3  . hydrochlorothiazide (MICROZIDE) 12.5 MG capsule TAKE 1 CAPSULE DAILY 180 capsule 0  . Insulin Disposable Pump (V-GO 40) KIT USE ONCE DAILY 90 kit 3  . insulin lispro (HUMALOG KWIKPEN) 200 UNIT/ML KwikPen Inject 18-25 units before dinner, under skin 15 mL 5  . insulin lispro (HUMALOG) 100 UNIT/ML injection INJECT 76 UNITS UNDER THE SKIN ONCE AS DIRECTED WITH VGO 40 70 mL 3  . Insulin Pen Needle 32G X 4 MM MISC Use 1x a day 100 each 3  . levonorgestrel-ethinyl estradiol (LYBREL,AMETHYST) 90-20 MCG tablet Take 1 tablet by mouth daily.     . metFORMIN (GLUCOPHAGE-XR) 500 MG 24 hr tablet TAKE 2 TABLETS (1,000 MG TOTAL) TWICE A DAY WITH A MEAL 360 tablet 3  . modafinil (PROVIGIL) 200 MG tablet Take 1 tablet (200 mg total) by mouth daily as needed. 90 tablet 1  . Naproxen Sodium 220 MG CAPS Take 440 mg by mouth 2 (two) times daily as needed (for pain.).     Marland Kitchen natalizumab (TYSABRI) 300 MG/15ML injection Inject 300 mg into the vein every 30 (thirty) days.    . Semaglutide, 1 MG/DOSE, (OZEMPIC, 1 MG/DOSE,) 2 MG/1.5ML SOPN Inject 0.75 mLs (1 mg total) into the skin once a week. 6 pen 3   No current facility-administered  medications on file prior to visit.   Allergies  Allergen Reactions  . Ace Inhibitors Swelling    Edema   Family History  Problem Relation Age of Onset  . Breast cancer Mother        early 64s  . Diabetes  Mother        maternal grandmother as well  . Diabetes Father   . Other Father        progressive supranuclear palsy    PE: BP 132/86   Pulse 94   Ht _0  (1.676 m)   Wt 242 lb (109.8 kg)   SpO2 97%   BMI 39.06 kg/m  Body mass index is 39.71 kg/m. Wt Readings from Last 3 Encounters:  06/29/19 242 lb (109.8 kg)  03/30/19 246 lb (111.6 kg)  10/02/18 244 lb 8 oz (110.9 kg)   Constitutional: overweight, in NAD Eyes: PERRLA, EOMI, no exophthalmos ENT: moist mucous membranes, no thyromegaly, no cervical lymphadenopathy Cardiovascular: RRR, No MRG Respiratory: CTA B Gastrointestinal: abdomen soft, NT, ND, BS+ Musculoskeletal: no deformities, strength intact in all 4 Skin: moist, warm, no rashes Neurological: no tremor with outstretched hands, DTR normal in all 4  ASSESSMENT: 1. DM2, insulin-dependent, uncontrolled, with hyperglycemia  She saw the diabetes educator for a discussion about insulin pumps >> decided for a VGo  2. Obesity class 2 BMI Classification:  < 18.5 underweight   18.5-24.9 normal weight   25.0-29.9 overweight   30.0-34.9 class I obesity   35.0-39.9 class II obesity   ? 40.0 class III obesity   3. HL  PLAN:  1. Patient with history of uncontrolled type 2 diabetes, exacerbated by steroid courses for MS and also by extensive traveling for work in the past.  She continues on V-Go 40 patch pump, daily GLP-1 receptor agonist and also, oral medication with Metformin and sulfonylurea.  We tried to add an SGLT2 inhibitor but she could not tolerate this due to yeast infections. -At last visit, HbA1c was still high, at 8.7%.  At that time, we changed Victoza to Ozempic and we started a separate Humalog dose before dinner.  She just started on a CGM before our last visit. - at this visit, she tells me she just started Ozempic 3 weeks ago >> sugars improved, weight is better, she is not as hungry and has less GI sxs. Also, she likes the fact that she only takes  it once a week! - sugars are much improved, only slightly higher after dinner >> will continue the same regimen, with prn extra Humalog before a larger dinner -  I advised her to:  Patient Instructions  Please continue: - Metformin ER 1000 mg 2x a dayt - Glipizide XL 10 mg in am - VGo 40 - 0-17 clicks per meal - Humalog 6 units before a large dinner - Ozempic 1 mg weekly  Please return in 3-4 months..   - we checked her HbA1c: 7.4% (better) - advised to check sugars at different times of the day - 4x a day, rotating check times - advised for yearly eye exams >> she is not UTD - return to clinic in 3 months  2. Obesity class 2 -She gained 6 pounds before last visit, and 10 pounds before the previous visit. -Since last visit, she lost 4 pounds -continue GLP-1 receptor agonist which should also help with weight loss  3. HL -Reviewed latest lipid panel from 03/2019: LDL excellent, much improved, triglycerides above target, HDL slightly lower than goal:  Lab Results  Component Value Date   CHOL 100 03/30/2019   HDL 31.90 (L) 03/30/2019   LDLCALC 33 03/30/2019   LDLDIRECT 125.0 08/21/2017   TRIG 177.0 (H) 03/30/2019   CHOLHDL 3 03/30/2019  -At last visit she was on pravastatin as she did not like how it made her feel.  She also tried Zocor with the same effect in the past.  We then changed to Lipitor 40 mg which she tolerates well.   [name], MD PhD [facility]

## 2019-07-08 NOTE — Telephone Encounter (Signed)
I tried to call her again and got voicemail but the mailbox was full.  She is JCV antibody borderline positive so risk is still fairly low on Tysabri but I had wanted to discuss with her.  I will ask the infusion service to let me know when she comes in for her next infusion so that I can address further with her.  Most of the time when patients are borderline or low positive we continue to Tysabri, however I will sometimes extend the interval.

## 2019-07-21 ENCOUNTER — Other Ambulatory Visit: Payer: Self-pay | Admitting: Internal Medicine

## 2019-07-27 ENCOUNTER — Telehealth: Payer: Self-pay | Admitting: Family Medicine

## 2019-07-27 ENCOUNTER — Other Ambulatory Visit: Payer: Self-pay | Admitting: Neurology

## 2019-07-27 ENCOUNTER — Telehealth: Payer: Self-pay | Admitting: *Deleted

## 2019-07-27 DIAGNOSIS — Z79899 Other long term (current) drug therapy: Secondary | ICD-10-CM | POA: Diagnosis not present

## 2019-07-27 DIAGNOSIS — G35 Multiple sclerosis: Secondary | ICD-10-CM | POA: Diagnosis not present

## 2019-07-27 MED ORDER — MODAFINIL 200 MG PO TABS: 200.0000 mg | ORAL_TABLET | Freq: Every day | ORAL | 1 refills | Status: DC | PRN

## 2019-07-27 MED ORDER — CARVEDILOL 6.25 MG PO TABS: 6.2500 mg | ORAL_TABLET | Freq: Two times a day (BID) | ORAL | 0 refills | Status: DC

## 2019-07-27 NOTE — Telephone Encounter (Signed)
Placed JCV lab in [facility] lock box for routine lab pick up. Results pending. 

## 2019-07-27 NOTE — Telephone Encounter (Signed)
I spoke to Kristin Mathews.  Her last JCV antibody was low positive.  I will extend the interval of her Tysabri to every 6 weeks.  Additionally we will recheck her next JCV antibody today.  She has an appointment to see me next month

## 2019-07-27 NOTE — Telephone Encounter (Signed)
Refill sent in

## 2019-07-27 NOTE — Addendum Note (Signed)
Addended by: Inis Sizer D on: 07/27/2019 01:34 PM   Modules accepted: Orders

## 2019-07-27 NOTE — Telephone Encounter (Signed)
Faxed completed/signed [facility] at [phone]. Received fax confirmation.

## 2019-07-27 NOTE — Telephone Encounter (Signed)
MEDICATION: carvedilol (COREG) 6.25 MG tablet  PHARMACY:  [facility], [place] Phone:  [phone]  Fax:  [phone]       Comments: Patient is scheduled for 10/26/19 for her CPE     **Let patient know to contact pharmacy at the end of the day to make sure medication is ready. **  ** Please notify patient to allow 48-72 hours to process**  **Encourage patient to contact the pharmacy for refills or they can request refills through [facility]**

## 2019-07-28 ENCOUNTER — Other Ambulatory Visit: Payer: Self-pay | Admitting: Family Medicine

## 2019-07-28 LAB — CBC WITH DIFFERENTIAL/PLATELET
Basophils Absolute: 0.1 10*3/uL (ref 0.0–0.2)
Basos: 1 %
EOS (ABSOLUTE): 0.7 10*3/uL — ABNORMAL HIGH (ref 0.0–0.4)
Eos: 5 %
Hematocrit: 43 % (ref 34.0–46.6)
Hemoglobin: 14.2 g/dL (ref 11.1–15.9)
Immature Grans (Abs): 0.1 10*3/uL (ref 0.0–0.1)
Immature Granulocytes: 1 %
Lymphocytes Absolute: 4.9 10*3/uL — ABNORMAL HIGH (ref 0.7–3.1)
Lymphs: 33 %
MCH: 29.2 pg (ref 26.6–33.0)
MCHC: 33 g/dL (ref 31.5–35.7)
MCV: 89 fL (ref 79–97)
Monocytes Absolute: 0.6 10*3/uL (ref 0.1–0.9)
Monocytes: 4 %
Neutrophils Absolute: 8.4 10*3/uL — ABNORMAL HIGH (ref 1.4–7.0)
Neutrophils: 56 %
Platelets: 357 10*3/uL (ref 150–450)
RBC: 4.86 x10E6/uL (ref 3.77–5.28)
RDW: 13.6 % (ref 11.7–15.4)
WBC: 14.8 10*3/uL — ABNORMAL HIGH (ref 3.4–10.8)

## 2019-08-04 NOTE — Telephone Encounter (Signed)
Called and LVM letting pt know JCV now back in normal range. Reminded her about upcoming f/u on 09/01/19 at 1030am, check in 1000am.

## 2019-08-04 NOTE — Telephone Encounter (Signed)
JCV ab drawn on 07/27/19 indeterminate, index: 0.35. Inhibition assay: negative.

## 2019-08-06 DIAGNOSIS — H1045 Other chronic allergic conjunctivitis: Secondary | ICD-10-CM | POA: Diagnosis not present

## 2019-08-11 NOTE — Telephone Encounter (Signed)
[facility]ed [facility] at [phone] to check on status of PA modafinil. Per automated system case [id number] approved 06/27/19-07/26/20.

## 2019-09-01 ENCOUNTER — Ambulatory Visit: Payer: BC Managed Care – PPO | Admitting: Family Medicine

## 2019-09-01 ENCOUNTER — Encounter: Payer: Self-pay | Admitting: Family Medicine

## 2019-09-01 VITALS — BP 126/88 | HR 96 | Ht 66.0 in | Wt 240.0 lb

## 2019-09-01 DIAGNOSIS — J309 Allergic rhinitis, unspecified: Secondary | ICD-10-CM

## 2019-09-01 DIAGNOSIS — Z79899 Other long term (current) drug therapy: Secondary | ICD-10-CM

## 2019-09-01 DIAGNOSIS — G4710 Hypersomnia, unspecified: Secondary | ICD-10-CM | POA: Diagnosis not present

## 2019-09-01 DIAGNOSIS — G35 Multiple sclerosis: Secondary | ICD-10-CM

## 2019-09-01 NOTE — Progress Notes (Signed)
PATIENT: Kristin Mathews DOB: 03/17/1970  REASON FOR VISIT: follow up HISTORY FROM: patient  Chief Complaint  Patient presents with  . Follow-up    6 month f/u. States she has been doing well since last visit  . room 2    alone      HISTORY OF PRESENT ILLNESS: Today 09/01/19 Kristin Mathews is a 49 y.o. female here today for follow up for RRMS. She continues Tysabri. Last infusion was about 5 weeks ago. Interval was changed to 6 weeks due to JCV low positive in 05/2019. Recheck was normal in 7/21.   She reports MS symptoms are stable. No new numbness or weakness. No changes in vision, gait, bowel or bladder habits. Mood is stable. 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 She tries to stay active. She continues modafinil during the week for chronic fatigue.   She has had more difficulty with allergy symptoms. She is seeing her ophthalmologist for scar tissue of lower lid thought to be caused by chronic allergies. She used steroid eye drops that is helping. No vision changes. She is seeing an allergies as well. Immunotherapy has not been helpful in the past.   She is now taking Ozempic. She reports daily CBG have improved significantly. Last A1C 7.4. She is no longer taking night dose of Humalog. She has been fully vaccinated.    HISTORY: (copied from Dr Garth Bigness note on 10/02/2018)  Debara Kamphuis is a 49 y.o. woman with relapsing remitting MS dx 2001.  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  She tolerates it well and has not had any exacerbations.  She was JCV antibody negative when last checked 12/31/2017.   We discussed CoVid-19 risks and CDC guidance.  Gait is doing well for the most part though balance is off and she uses the banister to go up and down stairs.  Strength and sensation is fine in the legs.  She denies any difficulty with her bladder.  Vision function is fine.  She notes more fatigue and  a couple days needed to take a nap.    She is sleeping well most nights since starting melatonin.  She denies any difficulties with cognition.  Her mood is fine.  Update 12/31/2017: She has been on Tysabri for about 5 years.  She tolerated well and has not had any exacerbations.   Her next Tysabri infusion will be 01/12/2018.  She is likely going [place] be out of the [place] 4 to 6 months starting in April for work.  We had a conversation about her MS disease modifying therapy options.  She has mild spasticity in her limbs if sitting/laying a long time and getting up.  No difficulty with gait though balance is slightly off on stairs and she uses the bannister.   Strength and sensation are fine.   Bladder is fine.  Vision is fine.   She notes fatigue (often work related).     Mood is usually ok.   Cognition is fine.     From 04/17/2016: She presented with unilateral numbness in May 2001 and was diagnosed with transverse myelitis.    MRI only showed one spot and an LP was non-diagnostic.   She had another episode of numbness 6 months later and MRI showed several newer lesions.    She was placed on REbif as part of the Rebiject study.     She had an  exacerbation in 2007 and in 2010 with numbness and then optic neuritis.   In 2015, she had another numbness exacerbation and we decided to switch to Tysabri.   She has had 3 doses, last dose was 12/1/8/205.    Insurance Nurse, mental health) will not cover Tysabri in a hospital outpt center and we are trying to find out how best to continue her infusions.     She tolerates Tysabri well.  Gait/strength/sensation   She notes mild spasticity but no weakness.   Gait is fine but she feels off balanced if she stands on a chair or ladder.   She fell and hit her head once while changing a smoke alarm battery.  Her tingling is mostly in the toes and bottoms of her feet.    Bladder:    She has bowel frequency and rare incontinence, especially if sugars are high.   Rare bowel  incontinence, also if sugars very high  Vision:   She notes mild visual blurring.   She has been told in the past that her allergies are affecting her vision.   Blurriness improves after she is up a while.   She is going to see ophtho.     Fatigue is often a problem.   Thi is physical > mental.    She has poor sleep with a lot of nocturnal awakenings.    Usually she falls back asleep when she wakes up.    She snores.   No reports of apnea.  She is sleepy during the day, helped byu Provigil  EPWORTH SLEEPINESS SCALE  On a scale of 0 - 3 what is the chance of dozing:  Sitting and Reading:                           3 Watching TV:                                      3 Sitting inactive in a public place:        0 Passenger in car for one hour:           3 Lying down to rest in the afternoon:   3 Sitting and talking to someone:          0 Sitting quietly after lunch:                   3 In a car, stopped in traffic:                  0  Total (out of 24):    15/24 (moderate sleepiness)  Mood:   She denies any difficulty with depression or anxiety.   No cognitive issues   REVIEW OF SYSTEMS: Out of a complete 14 system review of symptoms, the patient complains only of the following symptoms, chronic allergies, chronic fatigue, generalized weakness and all other reviewed systems are negative.   ALLERGIES: Allergies  Allergen Reactions  . Ace Inhibitors Swelling    Edema    HOME MEDICATIONS: Outpatient Medications Prior to Visit  Medication Sig Dispense Refill  . amLODipine (NORVASC) 5 MG tablet TAKE 1 TABLET DAILY 180 tablet 3  . atorvastatin (LIPITOR) 40 MG tablet TAKE 1 TABLET DAILY 90 tablet 3  . carvedilol (COREG) 6.25 MG tablet Take 1 tablet (6.25 mg total) by mouth 2 (two) times daily. Please  schedule appt with Dr. Yong Channel for further refills [phone] 180 tablet 0  . cetirizine (ZYRTEC) 10 MG tablet Take 10 mg by mouth every evening.     . Continuous Blood Gluc  Receiver (FREESTYLE LIBRE 14 DAY READER) DEVI 1 kit by Does not apply route See admin instructions. For continuous glucose monitoring 1 each 0  . Continuous Blood Gluc Sensor (FREESTYLE LIBRE 14 DAY SENSOR) MISC Change sensor every 14 days. 6 each 3  . DULoxetine (CYMBALTA) 60 MG capsule TAKE 1 CAPSULE DAILY 180 capsule 0  . fluticasone (FLONASE) 50 MCG/ACT nasal spray Place 2 sprays into both nostrils daily.    Marland Kitchen glipiZIDE (GLUCOTROL XL) 10 MG 24 hr tablet TAKE 1 TABLET DAILY WITH BREAKFAST 90 tablet 3  . hydrochlorothiazide (MICROZIDE) 12.5 MG capsule TAKE 1 CAPSULE DAILY (NEED APPOINTMENT) 180 capsule 3  . Insulin Disposable Pump (V-GO 40) KIT USE ONCE DAILY 90 kit 3  . Insulin Pen Needle 32G X 4 MM MISC Use 1x a day 100 each 3  . levonorgestrel-ethinyl estradiol (LYBREL,AMETHYST) 90-20 MCG tablet Take 1 tablet by mouth daily.     . metFORMIN (GLUCOPHAGE-XR) 500 MG 24 hr tablet TAKE 2 TABLETS (1,000 MG TOTAL) TWICE A DAY WITH A MEAL 360 tablet 3  . modafinil (PROVIGIL) 200 MG tablet Take 1 tablet (200 mg total) by mouth daily as needed. 90 tablet 1  . Naproxen Sodium 220 MG CAPS Take 440 mg by mouth 2 (two) times daily as needed (for pain.).     Marland Kitchen natalizumab (TYSABRI) 300 MG/15ML injection Inject 300 mg into the vein every 30 (thirty) days.    . Semaglutide, 1 MG/DOSE, (OZEMPIC, 1 MG/DOSE,) 2 MG/1.5ML SOPN Inject 0.75 mLs (1 mg total) into the skin once a week. 6 pen 3  . Blood Glucose Monitoring Suppl (FREESTYLE LITE) DEVI Use to check blood sugar twice a day 1 each 0  . glucose blood (FREESTYLE TEST STRIPS) test strip TEST BLOOD SUGAR TWICE A DAY. VARY TIMES AS DIRECTED. 200 each 3  . insulin lispro (HUMALOG KWIKPEN) 200 UNIT/ML KwikPen Inject 18-25 units before dinner, under skin 15 mL 5  . insulin lispro (HUMALOG) 100 UNIT/ML injection INJECT 76 UNITS UNDER THE SKIN ONCE AS DIRECTED WITH VGO 40 70 mL 3   No facility-administered medications prior to visit.    PAST MEDICAL HISTORY: Past  Medical History:  Diagnosis Date  . Arthritis    mild  . Depression   . Diabetes mellitus    Type 2  . Endometriosis   . History of kidney stones   . Hypertension   . Multiple sclerosis ([place])   . Type II or unspecified type diabetes mellitus with peripheral circulatory disorders, uncontrolled(250.72) 11/27/2012    PAST SURGICAL HISTORY: Past Surgical History:  Procedure Laterality Date  . EXPLORATORY LAPAROTOMY     endometriosis  . EXTRACORPOREAL SHOCK WAVE LITHOTRIPSY Right 11/19/2016   Procedure: RIGHT EXTRACORPOREAL SHOCK WAVE LITHOTRIPSY (ESWL);  Surgeon: Festus Aloe, MD;  Location: [facility];  Service: Urology;  Laterality: Right;  . LUMBAR DISC SURGERY     LS spine 2005 or so  . WISDOM TOOTH EXTRACTION      FAMILY HISTORY: Family History  Problem Relation Age of Onset  . Breast cancer Mother        early 47s  . Diabetes Mother        maternal grandmother as well  . Diabetes Father   . Other Father  progressive supranuclear palsy    SOCIAL HISTORY: Social History   Socioeconomic History  . Marital status: Single    Spouse name: Not on file  . Number of children: Not on file  . Years of education: Not on file  . Highest education level: Not on file  Occupational History  . Not on file  Tobacco Use  . Smoking status: Never Smoker  . Smokeless tobacco: Never Used  Substance and Sexual Activity  . Alcohol use: Yes    Alcohol/week: 0.0 standard drinks    Comment: occasional  . Drug use: No  . Sexual activity: Not on file  Other Topics Concern  . Not on file  Social History Narrative   Single. Lives alone. No children. Pet cat.       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 Difficulty of Paying Living Expenses:   Food Insecurity:   . Worried About  Charity fundraiser in the Last Year:   . Arboriculturist in the Last Year:   Transportation Needs:   . Film/video editor (Medical):   Marland Kitchen Lack of Transportation (Non-Medical):   Physical Activity:   . Days of Exercise per Week:   . Minutes of Exercise per Session:   Stress:   . Feeling of Stress :   Social Connections:   . Frequency of Communication with Friends and Family:   . Frequency of Social Gatherings with Friends and Family:   . Attends Religious Services:   . Active Member of Clubs or Organizations:   . Attends Archivist Meetings:   Marland Kitchen Marital Status:   Intimate Partner Violence:   . Fear of Current or Ex-Partner:   . Emotionally Abused:   Marland Kitchen Physically Abused:   . Sexually Abused:       PHYSICAL EXAM  Vitals:   09/01/19 1020  BP: 126/88  Pulse: 96  Weight: 240 lb (108.9 kg)  Height: _0  (1.676 m)   Body mass index is 38.74 kg/m.  Generalized: Well developed, in no acute distress  Cardiology: normal rate and rhythm, no murmur noted Respiratory: clear to auscultation bilaterally  Neurological examination  Mentation: Alert oriented to time, place, history taking. Follows all commands speech and language fluent Cranial nerve II-XII: Pupils were equal round reactive to light. Extraocular movements were full, visual field were full on confrontational test. Facial sensation and strength were normal. Uvula tongue midline. Head turning and shoulder shrug  were normal and symmetric. Motor: The motor testing reveals 5 over 5 strength of all 4 extremities. Good symmetric motor tone is noted throughout.  Sensory: Sensory testing is intact to soft touch on all 4 extremities. No evidence of extinction is noted.  Coordination: Cerebellar testing reveals good finger-nose-finger and heel-to-shin bilaterally.  Gait and station: Gait is normal.  Reflexes: Deep tendon reflexes are symmetric and normal bilaterally.   DIAGNOSTIC DATA (LABS, IMAGING, TESTING) - I  reviewed patient records, labs, notes, testing and imaging myself where available.  No flowsheet data found.   Lab Results  Component Value Date   WBC 14.8 (H) 07/27/2019   HGB 14.2 07/27/2019   HCT 43.0 07/27/2019   MCV 89 07/27/2019   PLT 357 07/27/2019  Component Value Date/Time   NA 138 03/30/2019 1404   NA 138 12/31/2017 1421   K 3.9 03/30/2019 1404   CL 99 03/30/2019 1404   CO2 29 03/30/2019 1404   GLUCOSE 162 (H) 03/30/2019 1404   BUN 17 03/30/2019 1404   BUN 14 12/31/2017 1421   CREATININE 0.99 03/30/2019 1404   CREATININE 0.73 04/30/2014 1109   CALCIUM 9.6 03/30/2019 1404   PROT 6.9 03/30/2019 1404   PROT 6.6 12/31/2017 1421   ALBUMIN 3.8 03/30/2019 1404   ALBUMIN 4.2 12/31/2017 1421   AST 30 03/30/2019 1404   ALT 32 03/30/2019 1404   ALKPHOS 76 03/30/2019 1404   BILITOT 0.5 03/30/2019 1404   BILITOT 0.2 12/31/2017 1421   GFRNONAA 78 12/31/2017 1421   GFRNONAA >89 04/30/2014 1109   GFRAA 90 12/31/2017 1421   GFRAA >89 04/30/2014 1109   Lab Results  Component Value Date   CHOL 100 03/30/2019   HDL 31.90 (L) 03/30/2019   LDLCALC 33 03/30/2019   LDLDIRECT 125.0 08/21/2017   TRIG 177.0 (H) 03/30/2019   CHOLHDL 3 03/30/2019   Lab Results  Component Value Date   HGBA1C 7.4 (A) 06/29/2019   No results found for: VITAMINB12 Lab Results  Component Value Date   TSH 2.97 04/22/2018     ASSESSMENT AND PLAN 49 y.o. year old female  has a past medical history of Arthritis, Depression, Diabetes mellitus, Endometriosis, History of kidney stones, Hypertension, Multiple sclerosis ([place]), and Type II or unspecified type diabetes mellitus with peripheral circulatory disorders, uncontrolled(250.72) (11/27/2012). here with     ICD-10-CM   1. Relapsing remitting multiple sclerosis ([place])  G35   2. High risk medication use  Z79.899   3. Excessive sleepiness  G47.10   4. Chronic allergic rhinitis  J30.9     Ms. Mallet continues to do well on Tysabri infusions.   Interval has been increased to 6 weeks due to recent low positive JCV.  Most recent JCV in July was normal. She will continue infusions every 6 weeks per Dr Garth Bigness recommendation. Will continue to assess need for alterations in scheduling as appropriate. She will continue modafinil as prescribed. Healthy lifestyle habits encouraged. She will follow up in 4-6 months. She verbalizes understanding and agreement with this plan.    No orders of the defined types were placed in this encounter.    No orders of the defined types were placed in this encounter.     I spent 20 minutes with the patient. 50% of this time was spent counseling and educating patient on plan of care and medications.    Debbora Presto, FNP-C 09/01/2019, 11:23 AM [facility] [address] [place], [place] [address] [phone]

## 2019-09-01 NOTE — Patient Instructions (Addendum)
We will continue current treatment plan.   Please stay well hydrated. Try to stay active.   Follow up with Dr Felecia Shelling in 4-6 months for repeat labs and follow up    Multiple Sclerosis Multiple sclerosis (MS) is a disease of the brain, spinal cord, and optic nerves (central nervous system). It causes the body's disease-fighting (immune) system to destroy the protective covering (myelin sheath) around nerves in the brain. When this happens, signals (nerve impulses) going to and from the brain and spinal cord do not get sent properly or may not get sent at all. There are several types of MS:  Relapsing-remitting MS. This is the most common type. This causes sudden attacks of symptoms. After an attack, you may recover completely until the next attack, or some symptoms may remain permanently.  Secondary progressive MS. This usually develops after the onset of relapsing-remitting MS. Similar to relapsing-remitting MS, this type also causes sudden attacks of symptoms. Attacks may be less frequent, but symptoms slowly get worse (progress) over time.  Primary progressive MS. This causes symptoms that steadily progress over time. This type of MS does not cause sudden attacks of symptoms. The age of onset of MS varies, but it often develops between 49-46 years of age. MS is a lifelong (chronic) condition. There is no cure, but treatment can help slow down the progression of the disease. What are the causes? The cause of this condition is not known. What increases the risk? You are more likely to develop this condition if:  You are a woman.  You have a relative with MS. However, the condition is not passed from parent to child (inherited).  You have a lack (deficiency) of vitamin D.  You smoke. MS is more common in the [place] than in the [place]. What are the signs or symptoms? Relapsing-remitting and secondary progressive MS cause symptoms to occur in episodes or  attacks that may last weeks to months. There may be long periods between attacks in which there are almost no symptoms. Primary progressive MS causes symptoms to steadily progress after they develop. Symptoms of MS vary because of the many different ways it affects the central nervous system. The main symptoms include:  Vision problems and eye pain.  Numbness.  Weakness.  Inability to move your arms, hands, feet, or legs (paralysis).  Balance problems.  Shaking that you cannot control (tremors).  Muscle spasms.  Problems with thinking (cognitive changes). MS can also cause symptoms that are associated with the disease, but are not always the direct result of an MS attack. They may include:  Inability to control urination or bowel movements (incontinence).  Headaches.  Fatigue.  Inability to tolerate heat.  Emotional changes.  Depression.  Pain. How is this diagnosed? This condition is diagnosed based on:  Your symptoms.  A neurological exam. This involves checking central nervous system function, such as nerve function, reflexes, and coordination.  MRIs of the brain and spinal cord.  Lab tests, including a lumbar puncture that tests the fluid that surrounds the brain and spinal cord (cerebrospinal fluid).  Tests to measure the electrical activity of the brain in response to stimulation (evoked potentials). How is this treated? There is no cure for MS, but medicines can help decrease the number and frequency of attacks and help relieve nuisance symptoms. Treatment options may include:  Medicines that reduce the frequency of attacks. These medicines may be given by injection, by mouth (orally), or through an IV.  Medicines that reduce inflammation (steroids). These may provide short-term relief of symptoms.  Medicines to help control pain, depression, fatigue, or incontinence.  Vitamin D, if you have a deficiency.  Using devices to help you move around (assistive  devices), such as braces, a cane, or a walker.  Physical therapy to strengthen and stretch your muscles.  Occupational therapy to help you with everyday tasks.  Alternative or complementary treatments such as exercise, massage, or acupuncture. Follow these instructions at home:  Take over-the-counter and prescription medicines only as told by your health care provider.  Do not drive or use heavy machinery while taking prescription pain medicine.  Use assistive devices as recommended by your physical therapist or your health care provider.  Exercise as directed by your health care provider.  Return to your normal activities as told by your health care provider. Ask your health care provider what activities are safe for you.  Reach out for support. Share your feelings with friends, family, or a support group.  Keep all follow-up visits as told by your health care provider and therapists. This is important. Where to find more information  National Multiple Sclerosis Society: https://www.nationalmssociety.org Contact a health care provider if:  You feel depressed.  You develop new pain or numbness.  You have tremors.  You have problems with sexual function. Get help right away if:  You develop paralysis.  You develop numbness.  You have problems with your bladder or bowel function.  You develop double vision.  You lose vision in one or both eyes.  You develop suicidal thoughts.  You develop severe confusion. If you ever feel like you may hurt yourself or others, or have thoughts about taking your own life, get help right away. You can go to your nearest emergency department or call:  Your local emergency services (911 in the [place]  A suicide crisis helpline, such as the [place] at 936-477-2935. This is open 24 hours a day. Summary  Multiple sclerosis (MS) is a disease of the central nervous system that causes the body's immune system  to destroy the protective covering (myelin sheath) around nerves in the brain.  There are 3 types of MS: relapsing-remitting, secondary progressive, and primary progressive. Relapsing-remitting and secondary progressive MS cause symptoms to occur in episodes or attacks that may last weeks to months. Primary progressive MS causes symptoms to steadily progress after they develop.  There is no cure for MS, but medicines can help decrease the number and frequency of attacks and help relieve nuisance symptoms. Treatment may also include physical or occupational therapy.  If you develop numbness, paralysis, vision problems, or other neurological symptoms, get help right away. This information is not intended to replace advice given to you by your health care provider. Make sure you discuss any questions you have with your health care provider. Document Revised: 12/14/2016 Document Reviewed: 03/12/2016 Elsevier Patient Education  2020 [employer].

## 2019-09-01 NOTE — Progress Notes (Signed)
I have read the note, and I agree with the clinical assessment and plan.  Keir Viernes A. Wasil Wolke, MD, PhD, FAAN Certified in Neurology, Clinical Neurophysiology, Sleep Medicine, Pain Medicine and Neuroimaging  [facility] [address], [address] [place], [place] [address] [phone]  

## 2019-09-07 DIAGNOSIS — G35 Multiple sclerosis: Secondary | ICD-10-CM | POA: Diagnosis not present

## 2019-09-28 LAB — HM DIABETES EYE EXAM

## 2019-10-15 ENCOUNTER — Telehealth: Payer: Self-pay | Admitting: *Deleted

## 2019-10-15 NOTE — Telephone Encounter (Signed)
Faxed completed/signed Tysabri pt status report and reauth questionnaire to [facility] touch at [phone]. Received confirmation.   Received fax from touch prescribing program that pt re-authorized for Tysabri from 10/15/19-05/14/20. Pt enrollment number [id number]. Account: [id number]. Site auth number: [id number].

## 2019-10-17 ENCOUNTER — Other Ambulatory Visit: Payer: Self-pay | Admitting: Family Medicine

## 2019-10-19 DIAGNOSIS — G35 Multiple sclerosis: Secondary | ICD-10-CM | POA: Diagnosis not present

## 2019-10-26 ENCOUNTER — Ambulatory Visit (INDEPENDENT_AMBULATORY_CARE_PROVIDER_SITE_OTHER): Payer: BC Managed Care – PPO | Admitting: Internal Medicine

## 2019-10-26 ENCOUNTER — Encounter: Payer: Self-pay | Admitting: Family Medicine

## 2019-10-26 ENCOUNTER — Ambulatory Visit (INDEPENDENT_AMBULATORY_CARE_PROVIDER_SITE_OTHER): Payer: BC Managed Care – PPO | Admitting: Family Medicine

## 2019-10-26 ENCOUNTER — Other Ambulatory Visit: Payer: Self-pay

## 2019-10-26 ENCOUNTER — Encounter: Payer: Self-pay | Admitting: Internal Medicine

## 2019-10-26 VITALS — BP 130/84 | HR 94 | Temp 97.7°F | Resp 18 | Ht 66.0 in | Wt 236.0 lb

## 2019-10-26 VITALS — BP 156/92 | HR 92 | Ht 66.0 in | Wt 235.2 lb

## 2019-10-26 DIAGNOSIS — Z0000 Encounter for general adult medical examination without abnormal findings: Secondary | ICD-10-CM | POA: Diagnosis not present

## 2019-10-26 DIAGNOSIS — E669 Obesity, unspecified: Secondary | ICD-10-CM

## 2019-10-26 DIAGNOSIS — E1165 Type 2 diabetes mellitus with hyperglycemia: Secondary | ICD-10-CM | POA: Diagnosis not present

## 2019-10-26 DIAGNOSIS — E785 Hyperlipidemia, unspecified: Secondary | ICD-10-CM

## 2019-10-26 DIAGNOSIS — E1151 Type 2 diabetes mellitus with diabetic peripheral angiopathy without gangrene: Secondary | ICD-10-CM | POA: Diagnosis not present

## 2019-10-26 DIAGNOSIS — Z1211 Encounter for screening for malignant neoplasm of colon: Secondary | ICD-10-CM | POA: Diagnosis not present

## 2019-10-26 DIAGNOSIS — IMO0002 Reserved for concepts with insufficient information to code with codable children: Secondary | ICD-10-CM

## 2019-10-26 LAB — POCT GLYCOSYLATED HEMOGLOBIN (HGB A1C): Hemoglobin A1C: 7 % — AB (ref 4.0–5.6)

## 2019-10-26 MED ORDER — ATORVASTATIN CALCIUM 20 MG PO TABS
20.0000 mg | ORAL_TABLET | Freq: Every day | ORAL | 3 refills | Status: DC
Start: 2019-10-26 — End: 2020-10-03

## 2019-10-26 MED ORDER — DULOXETINE HCL 60 MG PO CPEP
ORAL_CAPSULE | ORAL | 3 refills | Status: DC
Start: 2019-10-26 — End: 2021-01-11

## 2019-10-26 MED ORDER — CARVEDILOL 6.25 MG PO TABS
6.2500 mg | ORAL_TABLET | Freq: Two times a day (BID) | ORAL | 3 refills | Status: DC
Start: 2019-10-26 — End: 2020-07-22

## 2019-10-26 NOTE — Progress Notes (Signed)
Patient ID: Kristin Mathews, female   DOB: [birth year], 49 y.o.   MRN: [id number]  This visit occurred during the SARS-CoV-2 public health emergency.  Safety protocols were in place, including screening questions prior to the visit, additional usage of staff PPE, and extensive cleaning of exam room while observing appropriate contact time as indicated for disinfecting solutions.   HPI: Kristin Mathews is a 49 y.o.-year-old female, presenting for f/u for DM2, dx 2009, insulin-dependent since 2009, uncontrolled, with complications (diabetic retinopathy). Last visit 4 months ago.  She is not traveling for work anymore.  Reviewed HbA1c levels: Lab Results  Component Value Date   HGBA1C 7.4 (A) 06/29/2019   HGBA1C 8.7 (A) 03/30/2019   HGBA1C 8.4 (A) 04/22/2018   HGBA1C 7.9 (A) 10/07/2017   HGBA1C 9 01/31/2017   HGBA1C 8.3 (H) 08/21/2016   HGBA1C 7.6 01/23/2016   HGBA1C 9.0 09/26/2015   HGBA1C 8.5 (H) 02/22/2015   HGBA1C 8.2 08/03/2014   HGBA1C 8.6 (H) 04/30/2014   HGBA1C 9.3 (H) 11/02/2013   HGBA1C 7.1 (H) 04/14/2013   HGBA1C 8.0 (H) 11/12/2012   HGBA1C 7.3 (H) 05/13/2012   HGBA1C 6.8 (H) 08/21/2011   HGBA1C 6.5 11/15/2010   HGBA1C 7.3 (H) 07/04/2010   HGBA1C 8.1 (H) 02/17/2010   HGBA1C 7.3 (H) 10/13/2009   HGBA1C 7.4 (H) 06/07/2009   HGBA1C 8.4 (H) 01/18/2009   HGBA1C 6.6 (H) 06/29/2008   HGBA1C 6.6 (H) 02/25/2008   HGBA1C 8.3 (H) 08/29/2006   She continues frequent steroid courses for MS.  She is on: - Metformin ER 1000 mg 2x a day - Glipizide XL 10 mg in am - Victoza 1.8 mg daily in a.m. >> Ozempic 1 mg weekly - VGo 40 - 1 click for 92E >> 10 g carbs >> 2-68 clicks per meal ( 1 TMHDQ/22 g carbs) - Humalog 18 to 25 units before dinner >> actually only takes 16 units occasionally She was previously on Bydureon. She was on Invokana 100 >> 300 >> had few yeast infections - stopped in 05/2013 and tried again in 2017 >> stopped again b/c yeast inf. She tried [place] but stopped  because yeast infection 04/2018 We stopped Humulin 70/30 25 units bid. Januvia - did not help.  Glumetza not covered by insurance.  She checks her sugars more than 4 times a day with her freestyle libre CGM.  Freestyle libre CGM parameters: - Average: 155 >> 140 >> 126 - % active CGM time:  41% of the time >> 74% >> 65% - Glucose variability 38.8%(target < or = to 36%) >> 28.3% >> 40.5 - time in range:  - very low (<54): 0% >> 1% >> 4% - low (54-69): 1% >> 2% >> 7% - normal range (70-180): 70% >> 78% >> 77% - high sugars (181-250): 18% >> 19% >> 11% - very high sugars (>250): 11% >> 0% >> 1%    Previously:   Lowest CBG: 21!! (at night) ...>> LO (night) >> 40; she has hypoglycemia awareness in the 50s. Highest 287 >> 250 >> 250 >> >300.  Pt's meals are: - Breakfast: yoghurt + granola, cereals  - Lunch: sandwich, sushi, [place]  - Dinner: home cooked meal: stews, etc. - Snacks: 3-4   -No CKD, last BUN/creatinine:  Lab Results  Component Value Date   BUN 17 03/30/2019   CREATININE 0.99 03/30/2019  She developed angioedema from ACE inhibitors in the past. -+ HL; last set of lipids: Lab Results  Component Value Date  CHOL 100 03/30/2019   HDL 31.90 (L) 03/30/2019   LDLCALC 33 03/30/2019   LDLDIRECT 125.0 08/21/2017   TRIG 177.0 (H) 03/30/2019   CHOLHDL 3 03/30/2019  On pravastatin added back in 08/2017 >> but feels hungry and "off" >> stopped several mo ago. Tried Zocor >> she did not like how she felt on it.  However, she now tolerates well Lipitor 40. - last eye exam was on 09/28/2019: + DR -+ Mild numbness and tingling in her feet.   She also has a history of MS (neuro Dr Arlice Colt) - last 2 atacks: 02/2012, 2010; also HTN, fatty liver, endometriosis. She had a kidney stone in 11/2016 (calcium oxalate)- had litotripsy. She had a total of 20-25 kidney stones throughout her life. 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  She  sees cardiology. She decreased Amlodipine 2/2 itching, leg swelling. She was started on Diuretic.  She she continues to travel a lot for her job.    ROS: Constitutional: no weight gain/no weight loss, no fatigue, no subjective hyperthermia, no subjective hypothermia Eyes: no blurry vision, no xerophthalmia ENT: no sore throat, no nodules palpated in neck, no dysphagia, no odynophagia, no hoarseness Cardiovascular: no CP/no SOB/no palpitations/no leg swelling Respiratory: no cough/no SOB/no wheezing Gastrointestinal: no N/no V/no D/no C/no acid reflux Musculoskeletal: no muscle aches/no joint aches Skin: no rashes, no hair loss Neurological: no tremors/+ numbness/+ tingling/no dizziness  I reviewed pt's medications, allergies, PMH, social hx, family hx, and changes were documented in the history of present illness. Otherwise, unchanged from my initial visit note.  Past Medical History:  Diagnosis Date  . Arthritis    mild  . Depression   . Diabetes mellitus    Type 2  . Endometriosis   . History of kidney stones   . Hypertension   . Multiple sclerosis ([place])   . Type II or unspecified type diabetes mellitus with peripheral circulatory disorders, uncontrolled(250.72) 11/27/2012   Past Surgical History:  Procedure Laterality Date  . EXPLORATORY LAPAROTOMY     endometriosis  . EXTRACORPOREAL SHOCK WAVE LITHOTRIPSY Right 11/19/2016   Procedure: RIGHT EXTRACORPOREAL SHOCK WAVE LITHOTRIPSY (ESWL);  Surgeon: Festus Aloe, MD;  Location: [facility];  Service: Urology;  Laterality: Right;  . LUMBAR DISC SURGERY     LS spine 2005 or so  . WISDOM TOOTH EXTRACTION     Social History   Socioeconomic History  . Marital status: Single    Spouse name: Not on file  . Number of children: Not on file  . Years of education: Not on file  . Highest education level: Not on file  Occupational History  . Not on file  Tobacco Use  . Smoking status: Never Smoker  . Smokeless tobacco: Never  Used  Substance and Sexual Activity  . Alcohol use: Yes    Alcohol/week: 0.0 standard drinks    Comment: occasional  . Drug use: No  . Sexual activity: Not on file  Other Topics Concern  . Not on file  Social History Narrative   Single. Lives alone. No children. Pet cat.       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 Difficulty of Paying Living Expenses: Not  on file  Food Insecurity:   . Worried About Charity fundraiser in the Last Year: Not on file  . Ran Out of Food in the Last Year: Not on file  Transportation Needs:   . Lack of Transportation (Medical): Not on file  . Lack of Transportation (Non-Medical): Not on file  Physical Activity:   . Days of Exercise per Week: Not on file  . Minutes of Exercise per Session: Not on file  Stress:   . Feeling of Stress : Not on file  Social Connections:   . Frequency of Communication with Friends and Family: Not on file  . Frequency of Social Gatherings with Friends and Family: Not on file  . Attends Religious Services: Not on file  . Active Member of Clubs or Organizations: Not on file  . Attends Archivist Meetings: Not on file  . Marital Status: Not on file  Intimate Partner Violence:   . Fear of Current or Ex-Partner: Not on file  . Emotionally Abused: Not on file  . Physically Abused: Not on file  . Sexually Abused: Not on file   Current Outpatient Medications on File Prior to Visit  Medication Sig Dispense Refill  . amLODipine (NORVASC) 5 MG tablet TAKE 1 TABLET DAILY 180 tablet 3  . atorvastatin (LIPITOR) 40 MG tablet TAKE 1 TABLET DAILY 90 tablet 3  . carvedilol (COREG) 6.25 MG tablet Take 1 tablet (6.25 mg total) by mouth 2 (two) times daily. Please schedule appt with Dr. Yong Channel for further refills [phone] 180  tablet 0  . cetirizine (ZYRTEC) 10 MG tablet Take 10 mg by mouth every evening.     . Continuous Blood Gluc Receiver (FREESTYLE LIBRE 14 DAY READER) DEVI 1 kit by Does not apply route See admin instructions. For continuous glucose monitoring 1 each 0  . Continuous Blood Gluc Sensor (FREESTYLE LIBRE 14 DAY SENSOR) MISC Change sensor every 14 days. 6 each 3  . DULoxetine (CYMBALTA) 60 MG capsule TAKE 1 CAPSULE DAILY (NEED APPOINTMENT) 180 capsule 3  . fluticasone (FLONASE) 50 MCG/ACT nasal spray Place 2 sprays into both nostrils daily.    Marland Kitchen glipiZIDE (GLUCOTROL XL) 10 MG 24 hr tablet TAKE 1 TABLET DAILY WITH BREAKFAST 90 tablet 3  . hydrochlorothiazide (MICROZIDE) 12.5 MG capsule TAKE 1 CAPSULE DAILY (NEED APPOINTMENT) 180 capsule 3  . Insulin Disposable Pump (V-GO 40) KIT USE ONCE DAILY 90 kit 3  . Insulin Pen Needle 32G X 4 MM MISC Use 1x a day 100 each 3  . levonorgestrel-ethinyl estradiol (LYBREL,AMETHYST) 90-20 MCG tablet Take 1 tablet by mouth daily.     . metFORMIN (GLUCOPHAGE-XR) 500 MG 24 hr tablet TAKE 2 TABLETS (1,000 MG TOTAL) TWICE A DAY WITH A MEAL 360 tablet 3  . modafinil (PROVIGIL) 200 MG tablet Take 1 tablet (200 mg total) by mouth daily as needed. 90 tablet 1  . Naproxen Sodium 220 MG CAPS Take 440 mg by mouth 2 (two) times daily as needed (for pain.).     Marland Kitchen natalizumab (TYSABRI) 300 MG/15ML injection Inject 300 mg into the vein every 30 (thirty) days.    . Semaglutide, 1 MG/DOSE, (OZEMPIC, 1 MG/DOSE,) 2 MG/1.5ML SOPN Inject 0.75 mLs (1 mg total) into the skin once a week. 6 pen 3   No current facility-administered medications on file prior to visit.   Allergies  Allergen Reactions  . Ace Inhibitors Swelling    Edema   Family History  Problem Relation Age of  Onset  . Breast cancer Mother        early 36s  . Diabetes Mother        maternal grandmother as well  . Diabetes Father   . Other Father        progressive supranuclear palsy    PE: BP (!) 156/92 (BP  Location: Right Arm, Patient Position: Sitting)   Pulse 92   Ht '5\' 6"'  (1.676 m)   Wt 235 lb 3.2 oz (106.7 kg)   SpO2 96%   BMI 37.96 kg/m  Body mass index is 37.96 kg/m. Wt Readings from Last 3 Encounters:  10/26/19 235 lb 3.2 oz (106.7 kg)  09/01/19 240 lb (108.9 kg)  06/29/19 242 lb (109.8 kg)   Constitutional: overweight, in NAD Eyes: PERRLA, EOMI, no exophthalmos ENT: moist mucous membranes, no thyromegaly, no cervical lymphadenopathy Cardiovascular: tachycardia, RR, No RG, +1/6 SEM Respiratory: CTA B Gastrointestinal: abdomen soft, NT, ND, BS+ Musculoskeletal: no deformities, strength intact in all 4 Skin: moist, warm, no rashes Neurological: no tremor with outstretched hands, DTR normal in all 4  ASSESSMENT: 1. DM2, insulin-dependent, uncontrolled, with complications: - DR  She saw the diabetes educator for a discussion about insulin pumps >> decided for a VGo  2. Obesity class 2 BMI Classification:  < 18.5 underweight   18.5-24.9 normal weight   25.0-29.9 overweight   30.0-34.9 class I obesity   35.0-39.9 class II obesity   ? 40.0 class III obesity   3. HL  PLAN:  1. Patient with history of uncontrolled type 2 diabetes, exacerbated by steroid courses for MS and also extensive traveling for work in the past.  She continues on the VGo 40 patch pump V-Go 40 patch pump, daily GLP-1 receptor agonist and also oral medications with Metformin and sulfonylurea.  We tried to add an SGLT2 inhibitor but she could not tolerate this due to yeast infections in the past.  She started Ozempic, and then also started a CGM earlier in the year and sugars improved afterwards.  At last visit, Hb A1c was 7.4%, improved.  Sugars were also much improved, only slightly higher after dinner and I advised her to take extra Humalog before a larger dinner. CGM interpretation: -At today's visit, we reviewed together her CGM downloads.  Her sugars 77% in target range, which is only slightly  worse than before, but still at goal.  She has more lows, though, 7% between 54 and 69 mg/dL and 4% lower than 54 mg/dL.  Reviewing the the overlay pattern, it appears that her sugars dropped overnight and they can also be lower after breakfast and, in general, in the first half of the day.  Therefore, for now, I advised her to stop the glipizide XL.  Also, since her sugars are higher after certain meals, including fast food, I advised her that she may need Humalog (6 to 10 units) before these meals.  As of now, she is not using any extra Humalog clicks per day and only uses the VGo for the basal rate.  Otherwise, we can continue with the Metformin ER, Ozempic, and VGo basal rates -  I advised her to:  Patient Instructions  Please continue: - Metformin ER 1000 mg 2x a day - VGo 40  - Ozempic 1 mg weekly  Please use Humalog (3-5 clicks) before a large meal.  Stop Glipizide XL.  Please return in 3-4 months.  - we checked her HbA1c: 7.0% (beter) - advised to check sugars at different times  of the day - 4x a day, rotating check times - advised for yearly eye exams >> she is UTD - return to clinic in 3-4 months  2. Obesity class 2 -Before last visit she lost 4 pounds, now lost 7 lbs since last OV -Continue GLP-1 receptor agonist which should also help with weight loss  3. HL -Reviewed latest lipid panel from 03/2019: LDL much improved, excellent, triglycerides slightly above target, HDL low: Lab Results  Component Value Date   CHOL 100 03/30/2019   HDL 31.90 (L) 03/30/2019   LDLCALC 33 03/30/2019   LDLDIRECT 125.0 08/21/2017   TRIG 177.0 (H) 03/30/2019   CHOLHDL 3 03/30/2019  -She tried pravastatin and simvastatin but she did not like how these made her feel. We then changed to Lipitor 40, which she tolerates well   [name], MD PhD [facility]

## 2019-10-26 NOTE — Addendum Note (Signed)
Addended by: Arnold Long R on: 10/26/2019 02:28 PM   Modules accepted: Orders

## 2019-10-26 NOTE — Progress Notes (Signed)
Phone [phone]   Subjective:  Patient presents today for their annual physical. Chief complaint-noted.   See problem oriented charting- ROS- full  review of systems was completed and negative except for: fatigue, congestion, post nasal drip, eye itching, eye pain, bulge l5-s1 with back pain, heat intolerance, immunocompromised with MS meds  The following were reviewed and entered/updated in epic: Past Medical History:  Diagnosis Date  . Arthritis    mild  . Depression   . Diabetes mellitus    Type 2  . Endometriosis   . History of kidney stones   . Hypertension   . Multiple sclerosis ([place])   . Type II or unspecified type diabetes mellitus with peripheral circulatory disorders, uncontrolled(250.72) 11/27/2012   Patient Active Problem List   Diagnosis Date Noted  . Aortic aneurysm ([place]) 08/21/2017    Priority: High  . Pulmonary nodule 08/21/2017    Priority: High  . Immunosuppressed status ([place]) 03/05/2017    Priority: High  . Uncontrolled type 2 diabetes mellitus with peripheral circulatory disorder ([place]) 11/27/2012    Priority: High  . Relapsing remitting multiple sclerosis ([place]) 07/05/2006    Priority: High  . Nephrolithiasis 08/21/2016    Priority: Medium  . Depression 02/25/2008    Priority: Medium  . Hyperlipidemia 08/29/2006    Priority: Medium  . Essential hypertension 07/05/2006    Priority: Medium  . Gait disturbance 04/17/2016    Priority: Low  . Tingling 04/17/2016    Priority: Low  . Snoring 04/17/2016    Priority: Low  . Urinary urgency 04/17/2016    Priority: Low  . Excessive sleepiness 04/17/2016    Priority: Low  . Allergic rhinitis 04/27/2014    Priority: Low  . Low back pain 04/27/2014    Priority: Low  . Endometriosis 07/05/2006    Priority: Low  . High risk medication use 04/01/2019  . Class II obesity 10/09/2016   Past Surgical History:  Procedure Laterality Date  . EXPLORATORY LAPAROTOMY     endometriosis  . EXTRACORPOREAL  SHOCK WAVE LITHOTRIPSY Right 11/19/2016   Procedure: RIGHT EXTRACORPOREAL SHOCK WAVE LITHOTRIPSY (ESWL);  Surgeon: Festus Aloe, MD;  Location: [facility];  Service: Urology;  Laterality: Right;  . LUMBAR DISC SURGERY     LS spine 2005 or so  . WISDOM TOOTH EXTRACTION      Family History  Problem Relation Age of Onset  . Breast cancer Mother        early 73s  . Diabetes Mother        maternal grandmother as well  . Diabetes Father   . Other Father        progressive supranuclear palsy    Medications- reviewed and updated Current Outpatient Medications  Medication Sig Dispense Refill  . amLODipine (NORVASC) 5 MG tablet TAKE 1 TABLET DAILY 180 tablet 3  . atorvastatin (LIPITOR) 20 MG tablet Take 1 tablet (20 mg total) by mouth daily. 90 tablet 3  . carvedilol (COREG) 6.25 MG tablet Take 1 tablet (6.25 mg total) by mouth 2 (two) times daily. 180 tablet 3  . Continuous Blood Gluc Receiver (FREESTYLE LIBRE 14 DAY READER) DEVI 1 kit by Does not apply route See admin instructions. For continuous glucose monitoring 1 each 0  . Continuous Blood Gluc Sensor (FREESTYLE LIBRE 14 DAY SENSOR) MISC Change sensor every 14 days. 6 each 3  . DULoxetine (CYMBALTA) 60 MG capsule TAKE 1 CAPSULE DAILY 180 capsule 3  . fluticasone (FLONASE) 50 MCG/ACT nasal spray Place 2  sprays into both nostrils as needed.     . hydrochlorothiazide (MICROZIDE) 12.5 MG capsule TAKE 1 CAPSULE DAILY (NEED APPOINTMENT) 180 capsule 3  . Insulin Disposable Pump (V-GO 40) KIT USE ONCE DAILY 90 kit 3  . Insulin Pen Needle 32G X 4 MM MISC Use 1x a day 100 each 3  . levonorgestrel-ethinyl estradiol (LYBREL,AMETHYST) 90-20 MCG tablet Take 1 tablet by mouth daily.     . metFORMIN (GLUCOPHAGE-XR) 500 MG 24 hr tablet TAKE 2 TABLETS (1,000 MG TOTAL) TWICE A DAY WITH A MEAL 360 tablet 3  . modafinil (PROVIGIL) 200 MG tablet Take 1 tablet (200 mg total) by mouth daily as needed. 90 tablet 1  . Naproxen Sodium 220 MG CAPS Take 440 mg by  mouth 2 (two) times daily as needed (for pain.).     Marland Kitchen natalizumab (TYSABRI) 300 MG/15ML injection Inject 300 mg into the vein every 30 (thirty) days.    . Semaglutide, 1 MG/DOSE, (OZEMPIC, 1 MG/DOSE,) 2 MG/1.5ML SOPN Inject 0.75 mLs (1 mg total) into the skin once a week. 6 pen 3   No current facility-administered medications for this visit.    Allergies-reviewed and updated Allergies  Allergen Reactions  . Ace Inhibitors Swelling    Edema    Social History   Social History Narrative   Single. Lives alone. No children. Pet cat.       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 Oropharynx normal Neck: no thyromegaly CV: RRR no murmurs rubs or gallops Lungs: CTAB no crackles, wheeze, rhonchi Abdomen: soft/nontender/nondistended/normal bowel sounds. No rebound or guarding.  Ext: no edema Skin: warm, dry Neuro: grossly normal, moves all extremities, PERRLA  Diabetic Foot Exam - Simple   Simple Foot Form Diabetic Foot exam was performed with the following findings: Yes 10/26/2019  4:37 PM  Visual Inspection No deformities, no ulcerations, no other skin breakdown bilaterally: Yes Sensation Testing Intact to touch and monofilament testing bilaterally: Yes Pulse Check Posterior Tibialis and Dorsalis pulse intact bilaterally: Yes Comments       Assessment and Plan   49 y.o. female presenting for annual physical.  Health Maintenance counseling: 1. Anticipatory guidance: Patient counseled regarding regular dental exams q6 months, eye exams- 2 small spots of retinopathy on right eye at eye exam,  avoiding smoking and  second hand smoke , limiting alcohol to 1 beverage per day.   2. Risk factor reduction:  Advised patient of need for regular exercise and diet rich and fruits and vegetables to reduce risk of heart attack and stroke. Exercise- 1 per week but wants to build this up- would like to realistically build to 2-3 days a week. Diet-mostly healthy if busy may do some fast food and sugars run higher. Down 10 lbs in the last 5 months and down 6 lbs from last in office visit here.  Wt Readings from Last 3 Encounters:  10/26/19 236 lb (107 kg)  10/26/19 235 lb 3.2 oz (106.7 kg)  09/01/19 240 lb (108.9 kg)  3. Immunizations/screenings/ancillary studies. - shingrix already planned.  Immunization History  Administered Date(s) Administered  . Influenza Whole 10/14/2008  . Influenza,inj,Quad PF,6+ Mos 11/02/2013, 10/09/2016, 10/07/2017  . Influenza-Unspecified 10/05/2019  . PFIZER SARS-COV-2 Vaccination 04/20/2019, 05/11/2019, 10/05/2019  . Pneumococcal Polysaccharide-23 01/16/2007  . Tdap 01/16/2007, 08/21/2017   Health Maintenance Due  Topic Date Due  . Hepatitis C Screening - declines for now Never done   4. Cervical cancer screening- last on file 2019 but reports having since that time with GYN- will get records 5. Breast cancer screening-  breast exam with gyn and mammogram has had since 2015- request records 6. Colon cancer screening - last done 2010- refer back today as now age 46.  58. Skin cancer screening- advised regular sunscreen use. Denies worrisome, changing, or new skin lesions. Back exam today- does not see dermatology.  8. Birth control/STD check- remains on levonorgestrel-ethinyl estradiol and not sure where she is in menopause at this time but has some symptoms per GYN.  9. Osteoporosis screening planned with GYN 10. Never smoker  Status of chronic or acute concerns   # aortic aneurysm- has seen vascular surgery  And was told was stable on echocardiogram. From Dr. Lucianne Lei tright's note  "Stable 4.0 cm fusiform ascending aneurysm Hypertension, fairly well controlled  plan: Repeat CTA of thoracic aorta in 18 months.  She understands the goal is to keep her blood pressure less than 732 systolic." -discussec calling to schedule follow up in next few months. bp at goal as below - pulmonary nodule was noted on CT chest 05/2018 with 1 year follow up  - since she will have repeat CTA we opted to defer until that visit  #hypertension S: medication: amlodipine 5 mg, coreg 6.125 mg BID, hctz 12.5 mg Home readings #s: good in our office today- was high at endocrine earlier today- but was late for appointment BP Readings from Last 3 Encounters:  10/26/19 130/84  10/26/19 (!) 156/92  09/01/19 126/88  A/P: Stable. Continue current medications.   #hyperlipidemia S: Medication:atorvastatin 26m  Lab Results  Component Value Date   CHOL 100 03/30/2019   HDL 31.90 (L) 03/30/2019   LDLCALC 33 03/30/2019   LDLDIRECT 125.0 08/21/2017   TRIG 177.0 (H) 03/30/2019   CHOLHDL 3 03/30/2019   A/P: excellent control earlier this year- too soon for full lipid repeat. In fact feel we can decrease dose to 221mand then reassess next visit  # Depression S: Medication:cymbalta 6036mepression screen [facility] 10/26/2019 03/13/2018 08/21/2017  Decreased Interest 0 0 0  Down, Depressed, Hopeless 0 0 0  PHQ - 2 Score 0 0 0  Altered sleeping - 1 1  Tired, decreased energy - 2 2  Change in appetite - 2 2  Feeling bad or failure about yourself  - 0 1  Trouble concentrating - 0 0  Moving slowly or fidgety/restless - 0 0  Suicidal thoughts - 0 0  PHQ-9 Score - 5 6  Difficult doing work/chores - Somewhat difficult -  A/P: reasonable control with PHq2 of 0- refill today  #Diabetes - a1c well controled with Dr. GheCruzita Lederern metformin and insulin and ozempic and CGM  #MS- on tysabri doing well- now every 6 weeks due JCV levels- ad visit in august with 4-6 month follow up planned -provigil also per Dr.  SatFelecia Shelling Recommended follow up: Return in about 1 year (around 10/25/2020) for physical or sooner if needed. Future Appointments  Date Time Provider DepRagland  02/26/2020  2:20 PM [name], MD [facility] None  03/07/2020 11:30 AM Lomax, Amy, NP [facility] None   Lab/Order associations:Non fasting   ICD-10-CM   1. Preventative health care  Z00.00   2. Screen for colon cancer  Z12.11 Ambulatory referral to Gastroenterology  3. Hyperlipidemia, unspecified hyperlipidemia type  E78.5     Meds ordered this encounter  Medications  . carvedilol (COREG) 6.25 MG tablet    Sig: Take 1 tablet (6.25 mg total) by mouth 2 (two) times daily.    Dispense:  180 tablet    Refill:  3  . atorvastatin (LIPITOR) 20 MG tablet    Sig: Take 1 tablet (20 mg total) by mouth daily.    Dispense:  90 tablet    Refill:  3  . DULoxetine (CYMBALTA) 60 MG capsule    Sig: TAKE 1 CAPSULE DAILY    Dispense:  180 capsule    Refill:  3    Return precautions advised.  Garret Reddish, MD

## 2019-10-26 NOTE — Patient Instructions (Addendum)
  Health Maintenance Due  Topic Date Due  . Hepatitis C Screening - hold off for now- possibly with next bloodwork Never done  . MAMMOGRAM - Sign release of information at the check out desk for last mammogram and pap smear from [facility]  07/31/2019  . COLONOSCOPY - We will call you within two weeks about your referral to GI. If you do not hear within 3 weeks, give [place] a call.   07/31/2019    From aug 17- Follow up with Dr Felecia Shelling in 4-6 months for repeat labs and follow up. Please call to schedule this  Also due for follow up in next few months with Dr. Prescott Gum  Reduce atorvastatin to 20mg . If you can have someone check lipids sometime after march 2022 that's great- if not we will in 1 year  Recommended follow up: Return in about 1 year (around 10/25/2020) for physical or sooner if needed.

## 2019-10-26 NOTE — Assessment & Plan Note (Signed)
#  hyperlipidemia S: Medication:atorvastatin 40mg   Lab Results  Component Value Date   CHOL 100 03/30/2019   HDL 31.90 (L) 03/30/2019   LDLCALC 33 03/30/2019   LDLDIRECT 125.0 08/21/2017   TRIG 177.0 (H) 03/30/2019   CHOLHDL 3 03/30/2019   A/P: excellent control earlier this year- too soon for full lipid repeat. In fact feel we can decrease dose to 20mg  and then reassess next visit

## 2019-10-26 NOTE — Patient Instructions (Signed)
Please continue: - Metformin ER 1000 mg 2x a day - VGo 40  - Ozempic 1 mg weekly  Please use Humalog (3-5 clicks) before a large meal.  Stop Glipizide XL.  Please return in 3-4 months.

## 2019-10-28 ENCOUNTER — Other Ambulatory Visit: Payer: Self-pay | Admitting: *Deleted

## 2019-10-28 DIAGNOSIS — I712 Thoracic aortic aneurysm, without rupture, unspecified: Secondary | ICD-10-CM

## 2019-11-24 ENCOUNTER — Other Ambulatory Visit: Payer: Self-pay | Admitting: Internal Medicine

## 2019-11-30 ENCOUNTER — Other Ambulatory Visit: Payer: Self-pay | Admitting: Internal Medicine

## 2019-11-30 DIAGNOSIS — G35 Multiple sclerosis: Secondary | ICD-10-CM | POA: Diagnosis not present

## 2019-12-02 ENCOUNTER — Other Ambulatory Visit: Payer: Self-pay

## 2019-12-02 ENCOUNTER — Ambulatory Visit
Admission: RE | Admit: 2019-12-02 | Discharge: 2019-12-02 | Disposition: A | Discharge status: Home / Self Care | Payer: BC Managed Care – PPO | Source: Ambulatory Visit | Attending: Cardiothoracic Surgery | Admitting: Cardiothoracic Surgery

## 2019-12-02 ENCOUNTER — Ambulatory Visit: Payer: BC Managed Care – PPO | Admitting: Cardiothoracic Surgery

## 2019-12-02 ENCOUNTER — Encounter: Payer: Self-pay | Admitting: Cardiothoracic Surgery

## 2019-12-02 VITALS — BP 162/98 | HR 85 | Temp 97.5°F | Resp 20 | Ht 66.0 in | Wt 235.8 lb

## 2019-12-02 DIAGNOSIS — I712 Thoracic aortic aneurysm, without rupture, unspecified: Secondary | ICD-10-CM

## 2019-12-02 DIAGNOSIS — M314 Aortic arch syndrome [Takayasu]: Secondary | ICD-10-CM | POA: Diagnosis not present

## 2019-12-02 DIAGNOSIS — R918 Other nonspecific abnormal finding of lung field: Secondary | ICD-10-CM | POA: Diagnosis not present

## 2019-12-02 DIAGNOSIS — R911 Solitary pulmonary nodule: Secondary | ICD-10-CM | POA: Diagnosis not present

## 2019-12-02 MED ORDER — IOPAMIDOL (ISOVUE-370) INJECTION 76%
75.0000 mL | Freq: Once | INTRAVENOUS | Status: AC | PRN
  Administered 2019-12-02: 75 mL via INTRAVENOUS

## 2019-12-02 NOTE — Progress Notes (Signed)
PCP is Kristin Olp, MD Referring Provider is Lacretia Leigh, MD  Chief Complaint  Patient presents with  . Thoracic Aortic Aneurysm    f/u with CTA chest today    HPI: 1-monthfollow-up with CTA for mild ascending aortic dilatation with history of hypertension, type 2 diabetes, obesity, multiple sclerosis on immunotherapy.  No symptoms of chest pain.  Blood pressure fairly well controlled with Norvasc and carvedilol.  Followed by her primary care physician Dr. HYong Channel  Patient had an echocardiogram in 2019 which shows a normal trileaflet aortic valve.  I reviewed today's CTA.  There has been no change in the ascending aorta since 2019 when the aortic dilatation was first noted as an incidental finding.  Remains at 4.0 cm.  Smooth walls without mural thickening or ulceration.  The 5 mm right lung nodule Remains unchanged and  no risk for malignancy.     Past Medical History:  Diagnosis Date  . Arthritis    mild  . Depression   . Diabetes mellitus    Type 2  . Endometriosis   . History of kidney stones   . Hypertension   . Multiple sclerosis ([place]   . Type II or unspecified type diabetes mellitus with peripheral circulatory disorders, uncontrolled(250.72) 11/27/2012    Past Surgical History:  Procedure Laterality Date  . EXPLORATORY LAPAROTOMY     endometriosis  . EXTRACORPOREAL SHOCK WAVE LITHOTRIPSY Right 11/19/2016   Procedure: RIGHT EXTRACORPOREAL SHOCK WAVE LITHOTRIPSY (ESWL);  Surgeon: EFestus Aloe MD;  Location: [facility];  Service: Urology;  Laterality: Right;  . LUMBAR DISC SURGERY     LS spine 2005 or so  . WISDOM TOOTH EXTRACTION      Family History  Problem Relation Age of Onset  . Breast cancer Mother        early 535s . Diabetes Mother        maternal grandmother as well  . Diabetes Father   . Other Father        progressive supranuclear palsy    Social History Social History   Tobacco Use  . Smoking status: Never Smoker  . Smokeless  tobacco: Never Used  Substance Use Topics  . Alcohol use: Yes    Alcohol/week: 0.0 standard drinks    Comment: occasional  . Drug use: No    Current Outpatient Medications  Medication Sig Dispense Refill  . amLODipine (NORVASC) 5 MG tablet TAKE 1 TABLET DAILY 180 tablet 3  . atorvastatin (LIPITOR) 20 MG tablet Take 1 tablet (20 mg total) by mouth daily. 90 tablet 3  . carvedilol (COREG) 6.25 MG tablet Take 1 tablet (6.25 mg total) by mouth 2 (two) times daily. 180 tablet 3  . Continuous Blood Gluc Receiver (FREESTYLE LIBRE 14 DAY READER) DEVI 1 kit by Does not apply route See admin instructions. For continuous glucose monitoring 1 each 0  . Continuous Blood Gluc Sensor (FREESTYLE LIBRE 14 DAY SENSOR) MISC Change sensor every 14 days. 6 each 3  . DULoxetine (CYMBALTA) 60 MG capsule TAKE 1 CAPSULE DAILY 180 capsule 3  . fluticasone (FLONASE) 50 MCG/ACT nasal spray Place 2 sprays into both nostrils as needed.     . hydrochlorothiazide (MICROZIDE) 12.5 MG capsule TAKE 1 CAPSULE DAILY (NEED APPOINTMENT) 180 capsule 3  . Insulin Disposable Pump (V-GO 40) KIT USE ONCE DAILY 90 kit 3  . insulin lispro (HUMALOG) 100 UNIT/ML injection INJECT 76 UNITS UNDER THE SKIN ONCE AS DIRECTED WITH VGO 40 70 mL 3  .  Insulin Pen Needle 32G X 4 MM MISC Use 1x a day 100 each 3  . levonorgestrel-ethinyl estradiol (LYBREL,AMETHYST) 90-20 MCG tablet Take 1 tablet by mouth daily.     . metFORMIN (GLUCOPHAGE-XR) 500 MG 24 hr tablet TAKE 2 TABLETS (1,000 MG TOTAL) TWICE A DAY WITH A MEAL 360 tablet 3  . modafinil (PROVIGIL) 200 MG tablet Take 1 tablet (200 mg total) by mouth daily as needed. 90 tablet 1  . Naproxen Sodium 220 MG CAPS Take 440 mg by mouth 2 (two) times daily as needed (for pain.).     Kristin Mathews natalizumab (TYSABRI) 300 MG/15ML injection Inject 300 mg into the vein every 30 (thirty) days.    . Semaglutide, 1 MG/DOSE, (OZEMPIC, 1 MG/DOSE,) 2 MG/1.5ML SOPN Inject 0.75 mLs (1 mg total) into the skin once a week. 6  pen 3   No current facility-administered medications for this visit.    Allergies  Allergen Reactions  . Ace Inhibitors Swelling    Edema    Review of Systems   No chest pain palpitations presyncope Works full-time No weight loss Receives immunotherapy injections frequently for her MS  BP (!) 162/98 (BP Location: Right Arm, Patient Position: Sitting, Cuff Size: Large)   Pulse 85   Temp (!) 97.5 F (36.4 C) (Skin)   Resp 20   Ht '5\' 6"'  (1.676 m)   Wt 235 lb 12.8 oz (107 kg)   SpO2 97% Comment: RA  BMI 38.06 kg/m  Physical Exam      Exam    General- alert and comfortable obese 49 year old female.    Neck- no JVD, no cervical adenopathy palpable, no carotid bruit   Lungs- clear without rales, wheezes   Cor- regular rate and rhythm, no murmur , gallop   Abdomen- soft, non-tender   Extremities - warm, non-tender, minimal edema   Neuro- oriented, appropriate, no focal weakness   Diagnostic Tests: CT images personally reviewed.  Ascending aorta remains unchanged at 4.0 cm.  Very low risk for dissection/tear.  The right upper lobe nodule  Impression: Stable mild dilatation of the fusiform ascending aorta, stable for the past 2 and half years, remains at almost no risk for aortic dissection  Plan: Continue blood pressure control.  Keep systolic blood pressure less than 742 systolic.  Avoid Levaquin antibiotic. Another surveillance scan in 18 months will be arranged for the patient.  Len Childs, MD [facility] [phone]

## 2020-01-11 DIAGNOSIS — G35 Multiple sclerosis: Secondary | ICD-10-CM | POA: Diagnosis not present

## 2020-02-03 DIAGNOSIS — J3081 Allergic rhinitis due to animal (cat) (dog) hair and dander: Secondary | ICD-10-CM | POA: Diagnosis not present

## 2020-02-03 DIAGNOSIS — J301 Allergic rhinitis due to pollen: Secondary | ICD-10-CM | POA: Diagnosis not present

## 2020-02-03 DIAGNOSIS — J3089 Other allergic rhinitis: Secondary | ICD-10-CM | POA: Diagnosis not present

## 2020-02-03 DIAGNOSIS — J310 Chronic rhinitis: Secondary | ICD-10-CM | POA: Diagnosis not present

## 2020-02-09 ENCOUNTER — Telehealth: Payer: Self-pay

## 2020-02-09 NOTE — Telephone Encounter (Signed)
err

## 2020-02-22 ENCOUNTER — Telehealth: Payer: Self-pay | Admitting: *Deleted

## 2020-02-22 ENCOUNTER — Other Ambulatory Visit: Payer: Self-pay | Admitting: *Deleted

## 2020-02-22 DIAGNOSIS — Z79899 Other long term (current) drug therapy: Secondary | ICD-10-CM | POA: Diagnosis not present

## 2020-02-22 DIAGNOSIS — G35 Multiple sclerosis: Secondary | ICD-10-CM | POA: Diagnosis not present

## 2020-02-22 NOTE — Telephone Encounter (Signed)
Placed JCV lab in [facility] lock box for routine lab pick up. Results pending. 

## 2020-02-22 NOTE — Addendum Note (Signed)
Addended by: Inis Sizer D on: 02/22/2020 02:19 PM   Modules accepted: Orders

## 2020-02-23 LAB — CBC WITH DIFFERENTIAL/PLATELET
Basophils Absolute: 0.1 10*3/uL (ref 0.0–0.2)
Basos: 1 %
EOS (ABSOLUTE): 0.5 10*3/uL — ABNORMAL HIGH (ref 0.0–0.4)
Eos: 4 %
Hematocrit: 42.7 % (ref 34.0–46.6)
Hemoglobin: 13.8 g/dL (ref 11.1–15.9)
Immature Grans (Abs): 0 10*3/uL (ref 0.0–0.1)
Immature Granulocytes: 0 %
Lymphocytes Absolute: 4.1 10*3/uL — ABNORMAL HIGH (ref 0.7–3.1)
Lymphs: 35 %
MCH: 28.2 pg (ref 26.6–33.0)
MCHC: 32.3 g/dL (ref 31.5–35.7)
MCV: 87 fL (ref 79–97)
Monocytes Absolute: 0.6 10*3/uL (ref 0.1–0.9)
Monocytes: 5 %
Neutrophils Absolute: 6.5 10*3/uL (ref 1.4–7.0)
Neutrophils: 55 %
Platelets: 382 10*3/uL (ref 150–450)
RBC: 4.89 x10E6/uL (ref 3.77–5.28)
RDW: 13.8 % (ref 11.7–15.4)
WBC: 11.8 10*3/uL — ABNORMAL HIGH (ref 3.4–10.8)

## 2020-02-26 ENCOUNTER — Ambulatory Visit: Payer: BC Managed Care – PPO | Admitting: Internal Medicine

## 2020-02-27 LAB — RFLX STRATIFY JCV (TM) AB INHIBITION: JCV Antibody by Inhibition: NEGATIVE

## 2020-02-27 LAB — STRATIFY JCV AB (W/ INDEX) W/ RFLX
Index Value: 0.21 — ABNORMAL HIGH
Stratify JCV (TM) Ab w/Reflex Inhibition: UNDETERMINED — AB

## 2020-02-29 ENCOUNTER — Ambulatory Visit: Payer: BC Managed Care – PPO | Admitting: Family Medicine

## 2020-02-29 ENCOUNTER — Encounter: Payer: Self-pay | Admitting: Family Medicine

## 2020-02-29 ENCOUNTER — Other Ambulatory Visit: Payer: Self-pay

## 2020-02-29 VITALS — BP 151/82 | HR 89 | Ht 66.0 in | Wt 238.0 lb

## 2020-02-29 DIAGNOSIS — G4710 Hypersomnia, unspecified: Secondary | ICD-10-CM

## 2020-02-29 DIAGNOSIS — Z79899 Other long term (current) drug therapy: Secondary | ICD-10-CM

## 2020-02-29 DIAGNOSIS — G35 Multiple sclerosis: Secondary | ICD-10-CM

## 2020-02-29 MED ORDER — MODAFINIL 200 MG PO TABS: 200.0000 mg | ORAL_TABLET | Freq: Every day | ORAL | 1 refills | Status: DC | PRN

## 2020-02-29 NOTE — Progress Notes (Signed)
I have read the note, and I agree with the clinical assessment and plan.  Cali Hope A. Clearnce Leja, MD, PhD, FAAN Certified in Neurology, Clinical Neurophysiology, Sleep Medicine, Pain Medicine and Neuroimaging  [facility] [address], [address] [place],  [address] [phone]  

## 2020-02-29 NOTE — Progress Notes (Signed)
PATIENT: Kristin Mathews DOB: 1970/02/08  REASON FOR VISIT: follow up HISTORY FROM: patient  Chief Complaint  Patient presents with  . 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 She continues q6wk infusions due to low positive JCV in 05/2019. Most recent JCV negative (0.21) on 02/22/20. CBC stable. Last MRI stable in 01/2018.   She is doing well, today. No new or worsening symptoms. She continues to work full time. She is interviewing for a new position and admits to higher stress levels recently. She is sleeping fairly well but waking some mornings and having trouble getting back to sleep. She is taking melatonin that helps. She also takes modafinil as needed for EDS. She feels it helps.   She admits that CBG's have been a little higher than normal. She blames stress. She also notes BP has been higher than normal. She is followed closely by cardiology for aortic aneurysm. She is taking amlodipine 23m and HCTZ 12.5 daily. She has called for follow up due to elevated readings.    09/01/2019 ALL:  TSOLVEIG FANGMANis a 50y.o. female here today for follow up for RRMS. She continues Tysabri. Last infusion was about 5 weeks ago. Interval was changed to 6 weeks due to JCV low positive in 05/2019. Recheck was normal in 7/21.   She reports MS symptoms are stable. No new numbness or weakness. No changes in vision, gait, bowel or bladder habits. Mood is stable. 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 She continues modafinil during the week for chronic fatigue.   She has had more difficulty with allergy symptoms. She is seeing her ophthalmologist for scar tissue of lower lid thought to be caused by chronic allergies. She used steroid eye drops that is helping. No vision changes. She is seeing an allergies as well. Immunotherapy has not been  helpful in the past.   She is now taking Ozempic. She reports daily CBG have improved significantly. Last A1C 7.4. She is no longer taking night dose of Humalog. She has been fully vaccinated.    HISTORY: (copied from Dr SGarth Bignessnote on 10/02/2018)  Kristin Locklinis a 50y.o. woman with relapsing remitting MS dx 2001.  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  She tolerates it well and has not had any exacerbations.  She was JCV antibody negative when last checked 12/31/2017.   We discussed CoVid-19 risks and CDC guidance.  Gait is doing well for the most part though balance is off and she uses the banister to go up and down stairs.  Strength and sensation is fine in the legs.  She denies any difficulty with her bladder.  Vision function is fine.  She notes more fatigue and a couple days needed to take a nap.    She is sleeping well most nights since starting melatonin.  She denies any difficulties with cognition.  Her mood is fine.  Update 12/31/2017: She has been on Tysabri for about 5 years.  She tolerated well and has not had any exacerbations.   Her next Tysabri infusion will be 01/12/2018.  She is likely going to be out of the [place] 4 to 6 months starting in April for work.  We  had a conversation about her MS disease modifying therapy options.  She has mild spasticity in her limbs if sitting/laying a long time and getting up.  No difficulty with gait though balance is slightly off on stairs and she uses the bannister.   Strength and sensation are fine.   Bladder is fine.  Vision is fine.   She notes fatigue (often work related).     Mood is usually ok.   Cognition is fine.     From 04/17/2016: She presented with unilateral numbness in May 2001 and was diagnosed with transverse myelitis.    MRI only showed one spot and an LP was non-diagnostic.   She had another episode of numbness 6 months later and MRI showed several newer  lesions.    She was placed on REbif as part of the Rebiject study.     She had an exacerbation in 2007 and in 2010 with numbness and then optic neuritis.   In 2015, she had another numbness exacerbation and we decided to switch to Tysabri.   She has had 3 doses, last dose was 12/1/8/205.    Insurance Nurse, mental health) will not cover Tysabri in a hospital outpt center and we are trying to find out how best to continue her infusions.     She tolerates Tysabri well.  Gait/strength/sensation   She notes mild spasticity but no weakness.   Gait is fine but she feels off balanced if she stands on a chair or ladder.   She fell and hit her head once while changing a smoke alarm battery.  Her tingling is mostly in the toes and bottoms of her feet.    Bladder:    She has bowel frequency and rare incontinence, especially if sugars are high.   Rare bowel incontinence, also if sugars very high  Vision:   She notes mild visual blurring.   She has been told in the past that her allergies are affecting her vision.   Blurriness improves after she is up a while.   She is going to see ophtho.     Fatigue is often a problem.   Thi is physical > mental.    She has poor sleep with a lot of nocturnal awakenings.    Usually she falls back asleep when she wakes up.    She snores.   No reports of apnea.  She is sleepy during the day, helped byu Provigil  EPWORTH SLEEPINESS SCALE  On a scale of 0 - 3 what is the chance of dozing:  Sitting and Reading:                           3 Watching TV:                                      3 Sitting inactive in a public place:        0 Passenger in car for one hour:           3 Lying down to rest in the afternoon:   3 Sitting and talking to someone:          0 Sitting quietly after lunch:                   3 In a car, stopped in traffic:  0  Total (out of 24):    15/24 (moderate sleepiness)  Mood:   She denies any difficulty with depression or anxiety.   No cognitive  issues   REVIEW OF SYSTEMS: Out of a complete 14 system review of symptoms, the patient complains only of the following symptoms, chronic allergies, chronic fatigue, generalized weakness and all other reviewed systems are negative.   ALLERGIES: Allergies  Allergen Reactions  . Ace Inhibitors Swelling    Edema    HOME MEDICATIONS: Outpatient Medications Prior to Visit  Medication Sig Dispense Refill  . amLODipine (NORVASC) 5 MG tablet TAKE 1 TABLET DAILY 180 tablet 3  . atorvastatin (LIPITOR) 20 MG tablet Take 1 tablet (20 mg total) by mouth daily. 90 tablet 3  . carvedilol (COREG) 6.25 MG tablet Take 1 tablet (6.25 mg total) by mouth 2 (two) times daily. 180 tablet 3  . Continuous Blood Gluc Receiver (FREESTYLE LIBRE 14 DAY READER) DEVI 1 kit by Does not apply route See admin instructions. For continuous glucose monitoring 1 each 0  . Continuous Blood Gluc Sensor (FREESTYLE LIBRE 14 DAY SENSOR) MISC Change sensor every 14 days. 6 each 3  . DULoxetine (CYMBALTA) 60 MG capsule TAKE 1 CAPSULE DAILY 180 capsule 3  . fluticasone (FLONASE) 50 MCG/ACT nasal spray Place 2 sprays into both nostrils as needed.     . hydrochlorothiazide (MICROZIDE) 12.5 MG capsule TAKE 1 CAPSULE DAILY (NEED APPOINTMENT) 180 capsule 3  . Insulin Disposable Pump (V-GO 40) KIT USE ONCE DAILY 90 kit 3  . insulin lispro (HUMALOG) 100 UNIT/ML injection INJECT 76 UNITS UNDER THE SKIN ONCE AS DIRECTED WITH VGO 40 70 mL 3  . Insulin Pen Needle 32G X 4 MM MISC Use 1x a day 100 each 3  . levonorgestrel-ethinyl estradiol (LYBREL,AMETHYST) 90-20 MCG tablet Take 1 tablet by mouth daily.     . metFORMIN (GLUCOPHAGE-XR) 500 MG 24 hr tablet TAKE 2 TABLETS (1,000 MG TOTAL) TWICE A DAY WITH A MEAL 360 tablet 3  . Naproxen Sodium 220 MG CAPS Take 440 mg by mouth 2 (two) times daily as needed (for pain.).     Marland Kitchen natalizumab (TYSABRI) 300 MG/15ML injection Inject 300 mg into the vein every 30 (thirty) days.    . Semaglutide, 1 MG/DOSE,  (OZEMPIC, 1 MG/DOSE,) 2 MG/1.5ML SOPN Inject 0.75 mLs (1 mg total) into the skin once a week. 6 pen 3  . modafinil (PROVIGIL) 200 MG tablet Take 1 tablet (200 mg total) by mouth daily as needed. 90 tablet 1   No facility-administered medications prior to visit.    PAST MEDICAL HISTORY: Past Medical History:  Diagnosis Date  . Arthritis    mild  . Depression   . Diabetes mellitus    Type 2  . Endometriosis   . History of kidney stones   . Hypertension   . Multiple sclerosis ([place])   . Type II or unspecified type diabetes mellitus with peripheral circulatory disorders, uncontrolled(250.72) 11/27/2012    PAST SURGICAL HISTORY: Past Surgical History:  Procedure Laterality Date  . EXPLORATORY LAPAROTOMY     endometriosis  . EXTRACORPOREAL SHOCK WAVE LITHOTRIPSY Right 11/19/2016   Procedure: RIGHT EXTRACORPOREAL SHOCK WAVE LITHOTRIPSY (ESWL);  Surgeon: Festus Aloe, MD;  Location: [facility];  Service: Urology;  Laterality: Right;  . LUMBAR DISC SURGERY     LS spine 2005 or so  . WISDOM TOOTH EXTRACTION      FAMILY HISTORY: Family History  Problem Relation Age of Onset  .  Breast cancer Mother        early 68s  . Diabetes Mother        maternal grandmother as well  . Diabetes Father   . Other Father        progressive supranuclear palsy    SOCIAL HISTORY: Social History   Socioeconomic History  . Marital status: Single    Spouse name: Not on file  . Number of children: Not on file  . Years of education: Not on file  . Highest education level: Not on file  Occupational History  . Not on file  Tobacco Use  . Smoking status: Never Smoker  . Smokeless tobacco: Never Used  Substance and Sexual Activity  . Alcohol use: Yes    Alcohol/week: 0.0 standard drinks    Comment: occasional  . Drug use: No  . Sexual activity: Not on file  Other Topics Concern  . Not on file  Social History Narrative   Single. Lives alone. No children. Pet cat.       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  Generalized: Well developed, in no acute distress  Cardiology: normal rate and rhythm, no murmur noted Respiratory: clear to auscultation bilaterally  Neurological examination  Mentation: Alert oriented to time, place, history taking. Follows all commands speech and language fluent Cranial nerve II-XII: Pupils were equal round reactive to light. Extraocular movements were full, visual field were full on confrontational test. Facial sensation and strength were normal. Uvula tongue midline. Head turning and shoulder shrug  were normal and symmetric. Motor: The motor testing reveals 5 over 5 strength of all 4 extremities. Good symmetric motor tone is noted throughout.  Sensory: Sensory testing is intact to soft touch on all 4 extremities. No evidence of extinction is noted.  Coordination: Cerebellar testing reveals good finger-nose-finger and heel-to-shin bilaterally.  Gait and station: Gait is normal.  Reflexes: Deep tendon reflexes are symmetric and normal bilaterally.   DIAGNOSTIC DATA (LABS, IMAGING, TESTING) - I reviewed patient records, labs, notes, testing and imaging myself where available.  No flowsheet data found.   Lab Results  Component Value Date   WBC 11.8 (H) 02/22/2020   HGB 13.8 02/22/2020   HCT 42.7 02/22/2020   MCV 87 02/22/2020   PLT 382 02/22/2020      Component Value Date/Time    NA 138 03/30/2019 1404   NA 138 12/31/2017 1421   K 3.9 03/30/2019 1404   CL 99 03/30/2019 1404   CO2 29 03/30/2019 1404   GLUCOSE 162 (H) 03/30/2019 1404   BUN 17 03/30/2019 1404   BUN 14 12/31/2017 1421   CREATININE 0.99 03/30/2019 1404   CREATININE 0.73 04/30/2014 1109   CALCIUM 9.6 03/30/2019 1404   PROT 6.9 03/30/2019 1404   PROT 6.6 12/31/2017 1421  ALBUMIN 3.8 03/30/2019 1404   ALBUMIN 4.2 12/31/2017 1421   AST 30 03/30/2019 1404   ALT 32 03/30/2019 1404   ALKPHOS 76 03/30/2019 1404   BILITOT 0.5 03/30/2019 1404   BILITOT 0.2 12/31/2017 1421   GFRNONAA 78 12/31/2017 1421   GFRNONAA >89 04/30/2014 1109   GFRAA 90 12/31/2017 1421   GFRAA >89 04/30/2014 1109   Lab Results  Component Value Date   CHOL 100 03/30/2019   HDL 31.90 (L) 03/30/2019   LDLCALC 33 03/30/2019   LDLDIRECT 125.0 08/21/2017   TRIG 177.0 (H) 03/30/2019   CHOLHDL 3 03/30/2019   Lab Results  Component Value Date   HGBA1C 7.0 (A) 10/26/2019   No results found for: VITAMINB12 Lab Results  Component Value Date   TSH 2.97 04/22/2018     ASSESSMENT AND PLAN 50 y.o. year old female  has a past medical history of Arthritis, Depression, Diabetes mellitus, Endometriosis, History of kidney stones, Hypertension, Multiple sclerosis ([place]), and Type II or unspecified type diabetes mellitus with peripheral circulatory disorders, uncontrolled(250.72) (11/27/2012). here with     ICD-10-CM   1. Relapsing remitting multiple sclerosis ([place])  G35   2. High risk medication use  Z79.899   3. Excessive sleepiness  G47.10      Ms. Rambo continues to do well on Tysabri infusions. Will continue 6wk intervals. Last infusion 02/22/20. Most recent JCV negative (0.21). She will continue modafinil as prescribed. Close follow up with PCP and cardiology advised. BP reviewed, today. Fortunately, she is asymptomatic. She has called cardiology for follow up to assess. Healthy lifestyle habits encouraged. She will follow up in 6  months with Dr Felecia Shelling. She verbalizes understanding and agreement with this plan.    No orders of the defined types were placed in this encounter.    Meds ordered this encounter  Medications  . modafinil (PROVIGIL) 200 MG tablet    Sig: Take 1 tablet (200 mg total) by mouth daily as needed.    Dispense:  90 tablet    Refill:  1    Order Specific Question:   Supervising Provider    Answer:   Melvenia Beam [id number]      I spent 25 minutes with the patient. 50% of this time was spent counseling and educating patient on plan of care and medications.     Debbora Presto, FNP-C 02/29/2020, 11:31 AM [facility] [address] [place], [place] [address] [phone]

## 2020-02-29 NOTE — Patient Instructions (Signed)
Below is our plan:  We will continue current treatment plan.   Please make sure you are staying well hydrated. I recommend 50-60 ounces daily. Well balanced diet and regular exercise encouraged. Consistent sleep schedule with 6-8 hours recommended.   Please continue follow up with care team as directed.   Follow up with Dr Sater in 6 months   You may receive a survey regarding today's visit. I encourage you to leave honest feed back as I do use this information to improve patient care. Thank you for seeing me today!      Multiple Sclerosis Multiple sclerosis (MS) is a disease of the brain, spinal cord, and optic nerves (central nervous system). It causes the body's disease-fighting (immune) system to destroy the protective covering (myelin sheath) around nerves in the brain. When this happens, signals (nerve impulses) going to and from the brain and spinal cord do not get sent properly or may not get sent at all. There are several types of MS:  Relapsing-remitting MS. This is the most common type. This causes sudden attacks of symptoms. After an attack, you may recover completely until the next attack, or some symptoms may remain permanently.  Secondary progressive MS. This usually develops after the onset of relapsing-remitting MS. Similar to relapsing-remitting MS, this type also causes sudden attacks of symptoms. Attacks may be less frequent, but symptoms slowly get worse (progress) over time.  Primary progressive MS. This causes symptoms that steadily progress over time. This type of MS does not cause sudden attacks of symptoms. The age of onset of MS varies, but it often develops between 20-40 years of age. MS is a lifelong (chronic) condition. There is no cure, but treatment can help slow down the progression of the disease. What are the causes? The cause of this condition is not known. What increases the risk? You are more likely to develop this condition if:  You are a  woman.  You have a relative with MS. However, the condition is not passed from parent to child (inherited).  You have a lack (deficiency) of vitamin D.  You smoke. MS is more common in the [place] than in the southern [place]. What are the signs or symptoms? Relapsing-remitting and secondary progressive MS cause symptoms to occur in episodes or attacks that may last weeks to months. There may be long periods between attacks in which there are almost no symptoms. Primary progressive MS causes symptoms to steadily progress after they develop. Symptoms of MS vary because of the many different ways it affects the central nervous system. The main symptoms include:  Vision problems and eye pain.  Numbness and weakness.  Inability to move your arms, hands, feet, or legs (paralysis).  Balance problems.  Shaking that you cannot control (tremors).  Muscle spasms.  Problems with thinking (cognitive changes). MS can also cause symptoms that are associated with the disease, but are not always the direct result of an MS attack. They may include:  Inability to control urination or bowel movements (incontinence).  Headaches.  Fatigue.  Inability to tolerate heat.  Emotional changes.  Depression.  Pain. How is this diagnosed? This condition is diagnosed based on:  Your symptoms.  A neurological exam. This involves checking central nervous system function, such as nerve function, reflexes, and coordination.  MRIs of the brain and spinal cord.  Lab tests, including a lumbar puncture that tests the fluid that surrounds the brain and spinal cord (cerebrospinal fluid).  Tests to measure   the electrical activity of the brain in response to stimulation (evoked potentials). How is this treated? There is no cure for MS, but medicines can help decrease the number and frequency of attacks and help relieve nuisance symptoms. Treatment options may include:  Medicines that  reduce the frequency of attacks. These medicines may be given by injection, by mouth (orally), or through an IV.  Medicines that reduce inflammation (steroids). These may provide short-term relief of symptoms.  Medicines to help control pain, depression, fatigue, or incontinence.  Nutritional counseling. Vitamin D supplements, if you have a deficiency.  Using devices to help you move around (assistive devices), such as braces, a cane, or a walker.  Physical therapy to strengthen and stretch your muscles.  Occupational therapy to help you with everyday tasks.  Alternative or complementary treatments such as exercise, massage, or acupuncture.   Follow these instructions at home:  Take over-the-counter and prescription medicines only as told by your health care provider.  Do not drive or use heavy machinery while taking prescription pain medicine.  Use assistive devices as recommended by your physical therapist or your health care provider.  Exercise as directed by your health care provider.  Eating healthy can help manage MS symptoms.  Return to your normal activities as told by your health care provider. Ask your health care provider what activities are safe for you.  Reach out for support. Share your feelings with friends, family, or a support group.  Keep all follow-up visits as told by your health care provider and therapists. This is important. Where to find more information  National Multiple Sclerosis Society: https://www.nationalmssociety.org  National Institute of Neurological Disorders and Stroke: https://www.ninds.nih.gov  National Center for Complementary and Integrative Health: https://www.nccih.nih.gov/ Contact a health care provider if:  You feel depressed.  You develop new pain or numbness.  You have tremors.  You have problems with sexual function. Get help right away if:  You develop paralysis.  You develop numbness.  You have problems with your  bladder or bowel function.  You develop double vision.  You lose vision in one or both eyes.  You develop suicidal thoughts.  You develop severe confusion. If you ever feel like you may hurt yourself or others, or have thoughts about taking your own life, get help right away. You can go to your nearest emergency department or call:  Your local emergency services (911 in the [place]  A suicide crisis helpline, such as the National Suicide Prevention Lifeline at 1-800-273-8255. This is open 24 hours a day. Summary  Multiple sclerosis (MS) is a disease of the central nervous system that causes the body's immune system to destroy the protective covering (myelin sheath) around nerves in the brain.  There are 3 types of MS: relapsing-remitting, secondary progressive, and primary progressive. Relapsing-remitting and secondary progressive MS cause symptoms to occur in episodes or attacks that may last weeks to months. Primary progressive MS causes symptoms to steadily progress after they develop.  There is no cure for MS, but medicines can help decrease the number and frequency of attacks and help relieve nuisance symptoms. Treatment may also include physical or occupational therapy.  If you develop numbness, paralysis, vision problems, or other neurological symptoms, get help right away. This information is not intended to replace advice given to you by your health care provider. Make sure you discuss any questions you have with your health care provider. Document Revised: 10/13/2019 Document Reviewed: 10/13/2019 Elsevier Patient Education  2021 Elsevier Inc.  

## 2020-03-02 NOTE — Telephone Encounter (Signed)
JCV ab drawn on 02/22/20 indeterminate, index: 0.21. Inhibition assay: negative

## 2020-03-04 ENCOUNTER — Other Ambulatory Visit: Payer: Self-pay | Admitting: Internal Medicine

## 2020-03-07 ENCOUNTER — Ambulatory Visit: Payer: BC Managed Care – PPO | Admitting: Family Medicine

## 2020-03-07 IMAGING — CT CT CHEST WITH CONTRAST
1 series · 15 of 34 positions shown, 19 images · IV contrast (iopamidol)
Comparison: CT dated 05/30/2017.

CLINICAL DATA: Aneurysm.

EXAM:
CT CHEST WITH CONTRAST
TECHNIQUE: Multidetector CT imaging of the chest was performed during
intravenous contrast administration.
CONTRAST:  75mL [id number] IOPAMIDOL ([id number]) INJECTION 61%

[Series 2: chest w/cm · axial · 0.77mm/px · z∈[-328,-52]mm · 15 of 163 slices shown, 19 images]
[im 13/163  mediastinal]
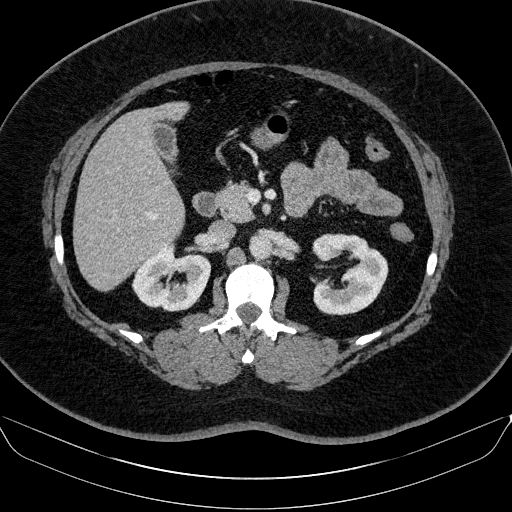
[im 13/163  lung]
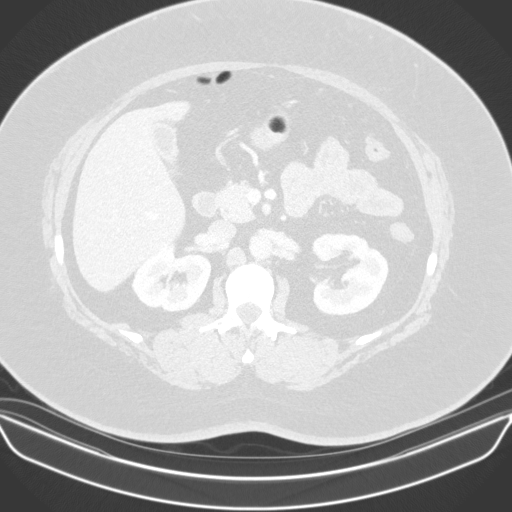
[im 25/163  lung]
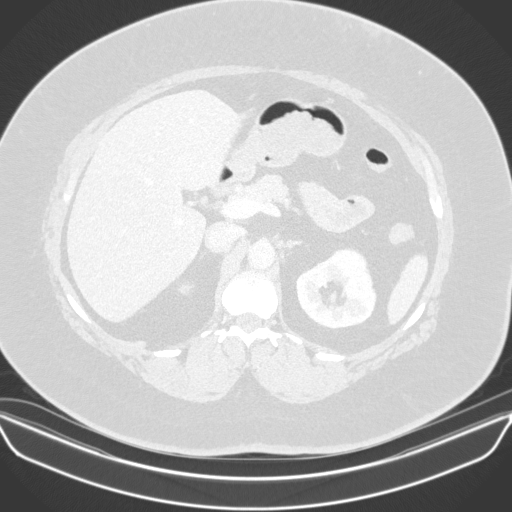
[im 33/163  lung]
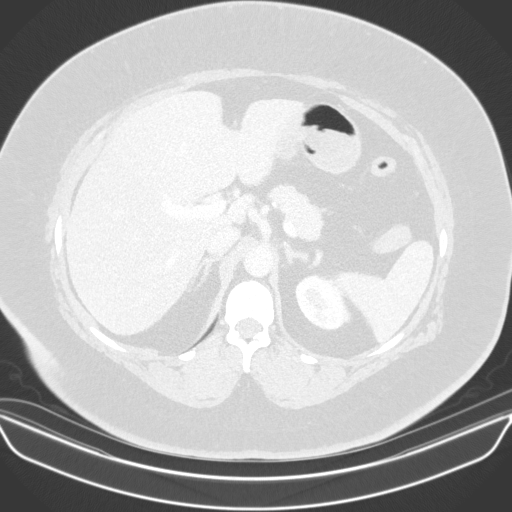
[im 43/163  lung]
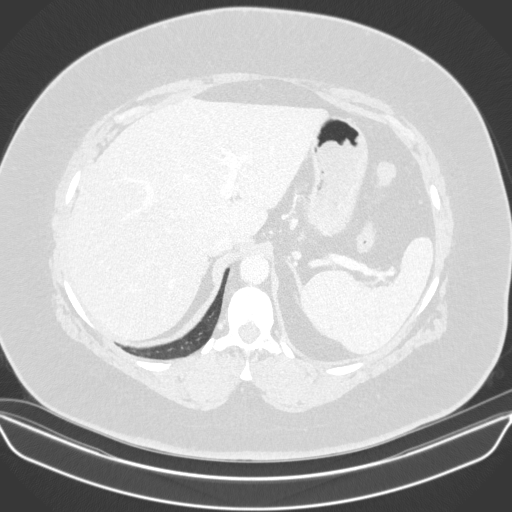
[im 55/163  mediastinal]
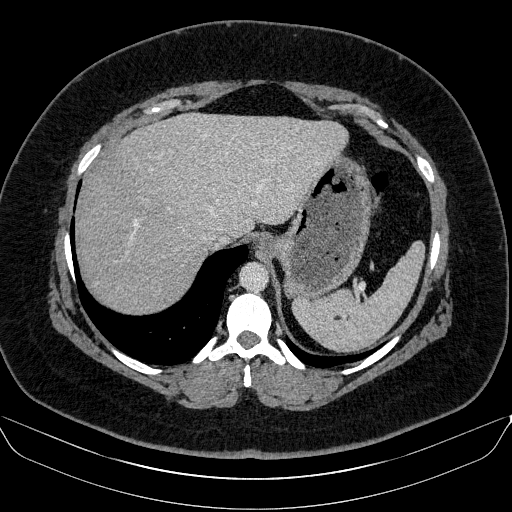
[im 55/163  lung]
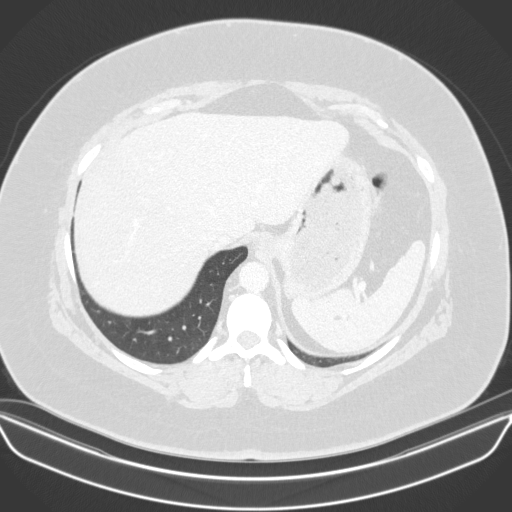
[im 65/163  lung]
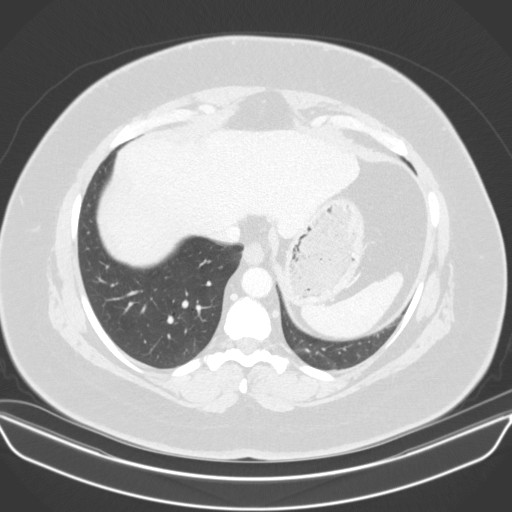
[im 73/163  lung]
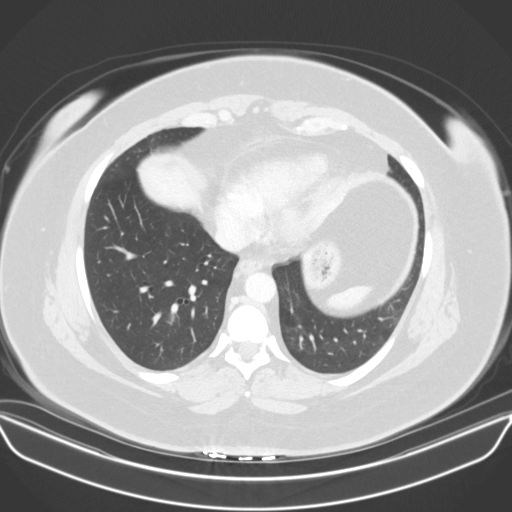
[im 85/163  lung]
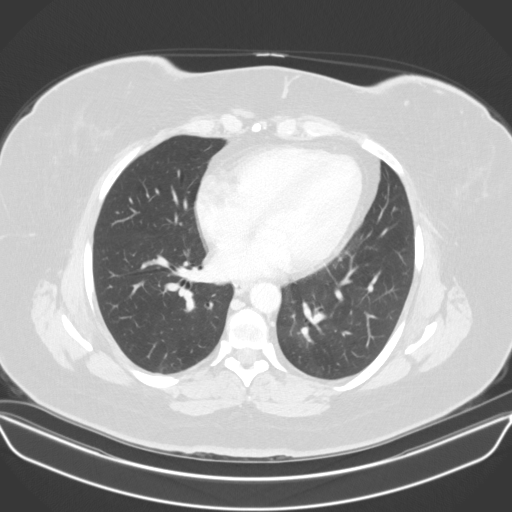
[im 91/163  mediastinal]
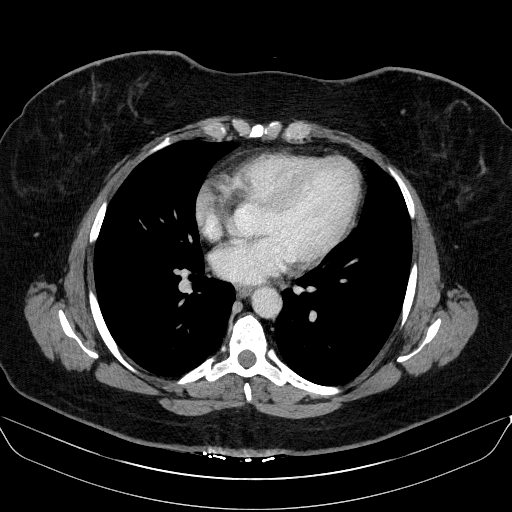
[im 91/163  lung]
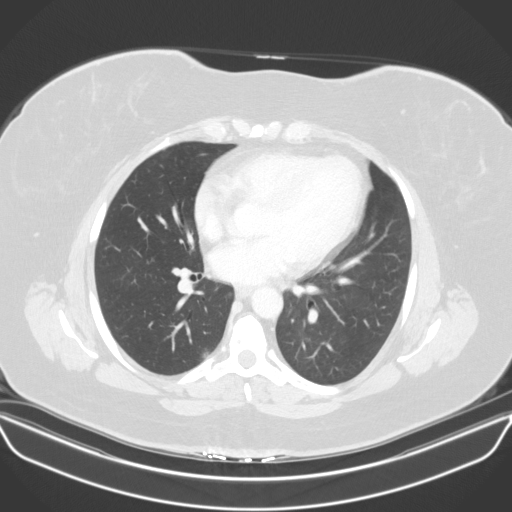
[im 98/163  lung]
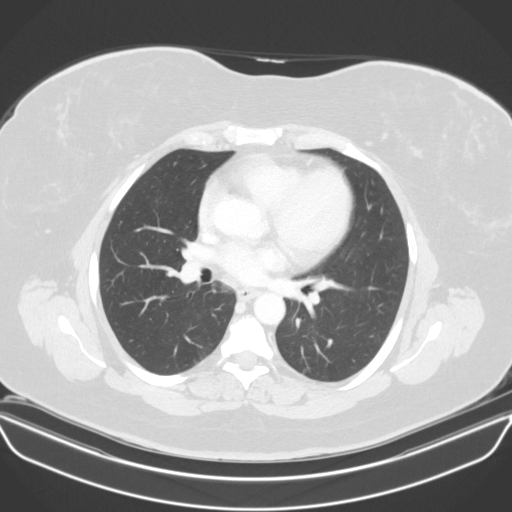
[im 109/163  lung]
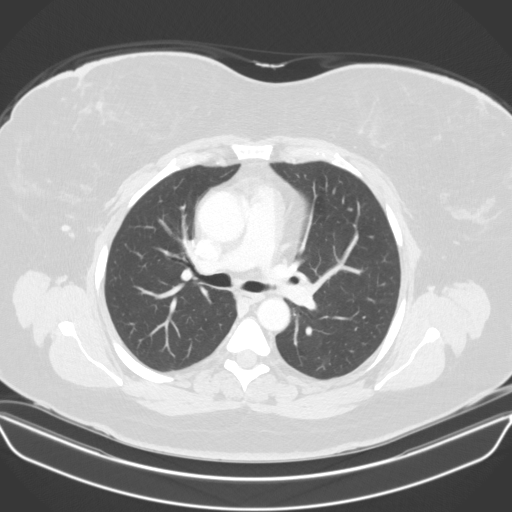
[im 121/163  lung]
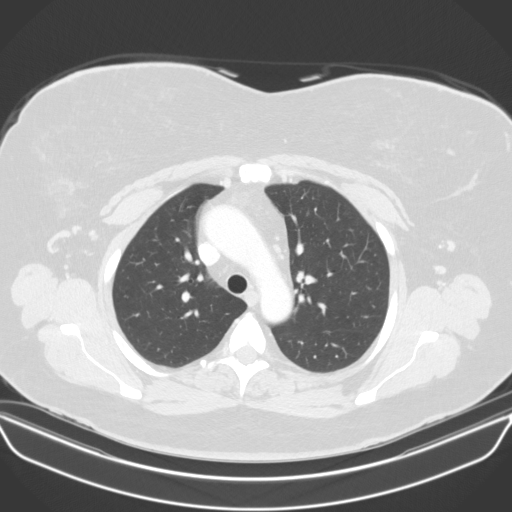
[im 130/163  mediastinal]
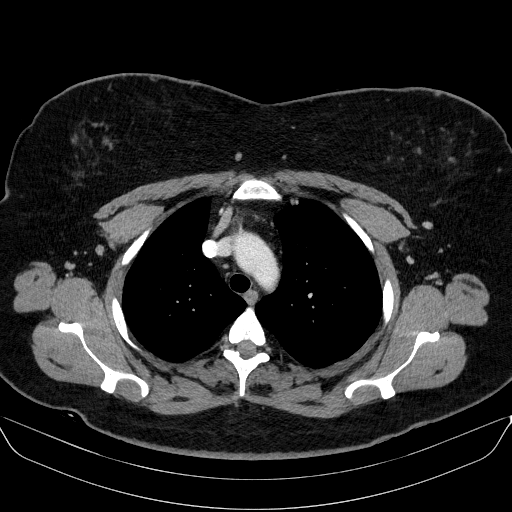
[im 130/163  lung]
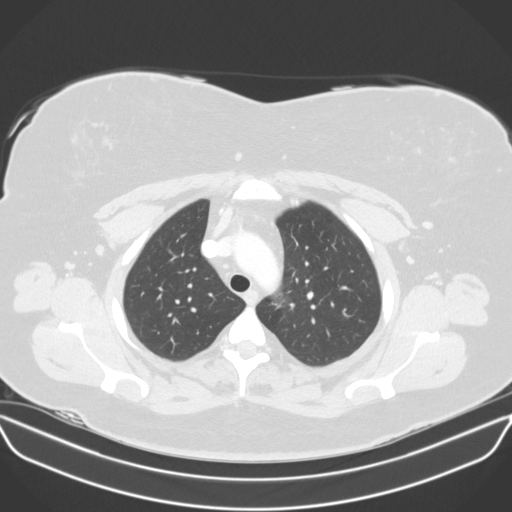
[im 139/163  lung]
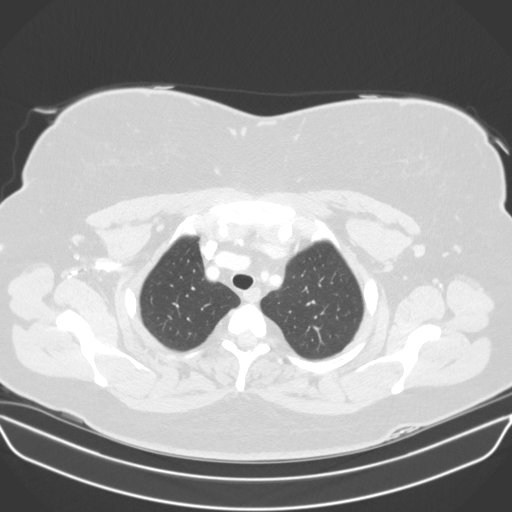
[im 151/163  lung]
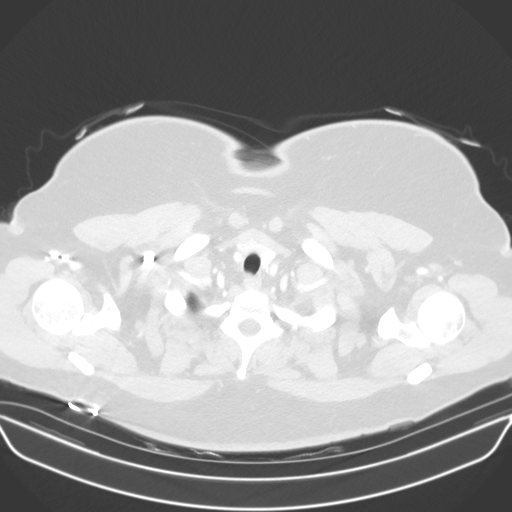

[15 of 34 positions shown; findings below may reference images not displayed]

FINDINGS: Cardiovascular: There is no evidence of a thoracic aortic aneurysm.
The ascending thoracic aorta is borderline ectatic measuring
approximately 4 cm. The aorta is otherwise unremarkable with minimal
atherosclerotic changes. The heart size is within normal limits.
There is no pericardial effusion.

Mediastinum/Nodes: No enlarged mediastinal, hilar, or axillary lymph
nodes. Thyroid gland, trachea, and esophagus demonstrate no
significant findings.

Lungs/Pleura: There is a 6 mm pulmonary nodule in the right upper
lobe (axial series 3, image 57). There are a few ground-glass
airspace opacities in the right lower lobe measuring up to
approximately 0.9 cm. There is no pneumothorax or significant
pleural effusion.

Upper Abdomen: No acute abnormality.

Musculoskeletal: No chest wall abnormality. No acute or significant
osseous findings.
IMPRESSION: 1. Borderline ectatic ascending aorta, measuring approximately 4 cm.
Recommend annual imaging followup by CTA or MRA. This recommendation
follows 4383 ACCF/AHA/AATS/ACR/ASA/SCA/DEJESUS/ZULHELMI/OLIC/GJYLTENE Guidelines
for the Diagnosis and Management of Patients with Thoracic Aortic
Disease. Circulation. 4383; 121: E266-e369. Aortic aneurysm NOS
(GXTW9-JMJ.O)
2. A 6 mm pulmonary nodule is noted in the right upper lobe as well
as a 0.9 cm ground-glass nodule in the right lower lobe. Follow-up
is recommended. The ground-glass nodule was not well appreciated on
prior CT. Initial follow-up with CT at 6-12 months is recommended to
confirm persistence. If persistent, repeat CT is recommended every 2
years until 5 years of stability has been established. This
recommendation follows the consensus statement: Guidelines for
Management of Incidental Pulmonary Nodules Detected on CT Images:

Aortic Atherosclerosis (GXTW9-LY9.9).

## 2020-03-08 DIAGNOSIS — Z6838 Body mass index (BMI) 38.0-38.9, adult: Secondary | ICD-10-CM | POA: Diagnosis not present

## 2020-03-08 DIAGNOSIS — Z1231 Encounter for screening mammogram for malignant neoplasm of breast: Secondary | ICD-10-CM | POA: Diagnosis not present

## 2020-03-08 DIAGNOSIS — Z01419 Encounter for gynecological examination (general) (routine) without abnormal findings: Secondary | ICD-10-CM | POA: Diagnosis not present

## 2020-03-14 ENCOUNTER — Other Ambulatory Visit: Payer: Self-pay | Admitting: Obstetrics and Gynecology

## 2020-03-14 DIAGNOSIS — R928 Other abnormal and inconclusive findings on diagnostic imaging of breast: Secondary | ICD-10-CM

## 2020-03-17 LAB — HM PAP SMEAR

## 2020-03-30 ENCOUNTER — Ambulatory Visit
Admission: RE | Admit: 2020-03-30 | Discharge: 2020-03-30 | Disposition: A | Discharge status: Home / Self Care | Payer: BC Managed Care – PPO | Source: Ambulatory Visit | Attending: Obstetrics and Gynecology | Admitting: Obstetrics and Gynecology

## 2020-03-30 ENCOUNTER — Other Ambulatory Visit: Payer: Self-pay

## 2020-03-30 DIAGNOSIS — R928 Other abnormal and inconclusive findings on diagnostic imaging of breast: Secondary | ICD-10-CM | POA: Diagnosis not present

## 2020-03-30 DIAGNOSIS — R922 Inconclusive mammogram: Secondary | ICD-10-CM | POA: Diagnosis not present

## 2020-03-30 DIAGNOSIS — N6002 Solitary cyst of left breast: Secondary | ICD-10-CM | POA: Diagnosis not present

## 2020-04-04 ENCOUNTER — Encounter: Payer: Self-pay | Admitting: Family Medicine

## 2020-04-08 ENCOUNTER — Other Ambulatory Visit: Payer: Self-pay

## 2020-04-08 ENCOUNTER — Ambulatory Visit: Payer: BC Managed Care – PPO | Admitting: Internal Medicine

## 2020-04-08 ENCOUNTER — Encounter: Payer: Self-pay | Admitting: Internal Medicine

## 2020-04-08 VITALS — BP 128/78 | HR 76 | Ht 66.0 in | Wt 238.8 lb

## 2020-04-08 DIAGNOSIS — IMO0002 Reserved for concepts with insufficient information to code with codable children: Secondary | ICD-10-CM

## 2020-04-08 DIAGNOSIS — E1165 Type 2 diabetes mellitus with hyperglycemia: Secondary | ICD-10-CM | POA: Diagnosis not present

## 2020-04-08 DIAGNOSIS — E1151 Type 2 diabetes mellitus with diabetic peripheral angiopathy without gangrene: Secondary | ICD-10-CM | POA: Diagnosis not present

## 2020-04-08 DIAGNOSIS — E785 Hyperlipidemia, unspecified: Secondary | ICD-10-CM | POA: Diagnosis not present

## 2020-04-08 DIAGNOSIS — E669 Obesity, unspecified: Secondary | ICD-10-CM | POA: Diagnosis not present

## 2020-04-08 DIAGNOSIS — Z1211 Encounter for screening for malignant neoplasm of colon: Secondary | ICD-10-CM

## 2020-04-08 NOTE — Progress Notes (Signed)
Patient ID: Kristin Mathews, female   DOB: 1970-11-29, 50 y.o.   MRN: [id number]  This visit occurred during the SARS-CoV-2 public health emergency.  Safety protocols were in place, including screening questions prior to the visit, additional usage of staff PPE, and extensive cleaning of exam room while observing appropriate contact time as indicated for disinfecting solutions.   HPI: Kristin Mathews is a 50 y.o.-year-old female, presenting for f/u for DM2, dx 2009, insulin-dependent since 2009, uncontrolled, with complications (diabetic retinopathy). Last visit 4 months ago.  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  The interview was very long and her sugars increased due to stress.  However, sugars have otherwise been fairly well controlled. She may restart to travel for work in the future. She feels well at this visit, without blurry vision, nausea, decreased urination.  Reviewed HbA1c levels: Lab Results  Component Value Date   HGBA1C 7.0 (A) 10/26/2019   HGBA1C 7.4 (A) 06/29/2019   HGBA1C 8.7 (A) 03/30/2019   HGBA1C 8.4 (A) 04/22/2018   HGBA1C 7.9 (A) 10/07/2017   HGBA1C 9 01/31/2017   HGBA1C 8.3 (H) 08/21/2016   HGBA1C 7.6 01/23/2016   HGBA1C 9.0 09/26/2015   HGBA1C 8.5 (H) 02/22/2015   HGBA1C 8.2 08/03/2014   HGBA1C 8.6 (H) 04/30/2014   HGBA1C 9.3 (H) 11/02/2013   HGBA1C 7.1 (H) 04/14/2013   HGBA1C 8.0 (H) 11/12/2012   HGBA1C 7.3 (H) 05/13/2012   HGBA1C 6.8 (H) 08/21/2011   HGBA1C 6.5 11/15/2010   HGBA1C 7.3 (H) 07/04/2010   HGBA1C 8.1 (H) 02/17/2010   HGBA1C 7.3 (H) 10/13/2009   HGBA1C 7.4 (H) 06/07/2009   HGBA1C 8.4 (H) 01/18/2009   HGBA1C 6.6 (H) 06/29/2008   HGBA1C 6.6 (H) 02/25/2008   HGBA1C 8.3 (H) 08/29/2006   She continues frequent steroid courses for MS.  She is on: - Metformin ER 1000 mg 2x a day - >> stopped 11/2019 - Victoza 1.8 mg daily in a.m. >> Ozempic 1 mg weekly - VGo 40 - 1 click for 30Z >> 10 g carbs >> 5  clicks per L and 6-01 clicks per D - Humalog 18 to 25 units before dinner >> takes 16 units occasionally She was previously on Bydureon. She was on Invokana 100 >> 300 >> had few yeast infections - stopped in 05/2013 and tried again in 2017 >> stopped again b/c yeast inf. She tried [place] but stopped because yeast infection 04/2018 We stopped Humulin 70/30 25 units bid. Januvia - did not help.  Glumetza not covered by insurance.  She checks her sugars more than 4 times a day with her freestyle libre CGM.    Previously:   Lowest CBG: 21!! (at night) ...>> LO (night) >> 40 >> 54; she has hypoglycemia awareness in the 50s. Highest 287 >> 250 >> 250 >> >300 >> 300s.  Pt's meals are: - Breakfast: yoghurt + granola, cereals  - Lunch: sandwich, sushi, [place]  - Dinner: home cooked meal: stews, etc. - Snacks: 3-4   -No CKD, last BUN/creatinine:  Lab Results  Component Value Date   BUN 17 03/30/2019   CREATININE 0.99 03/30/2019  She developed angioedema from ACE inhibitors in the past. -+ HL; last set of lipids: Lab Results  Component Value Date   CHOL 100 03/30/2019   HDL 31.90 (L) 03/30/2019   LDLCALC 33 03/30/2019   LDLDIRECT 125.0 08/21/2017   TRIG 177.0 (H) 03/30/2019   CHOLHDL 3 03/30/2019  On pravastatin  added back in 08/2017 >> but feels hungry and "off" >> stopped several mo ago. Tried Zocor >> she did not like how she felt on it.  However, she now tolerates well Lipitor 40. - last eye exam was on 09/28/2019: + DR -+ Mild numbness and tingling in her feet.   She also has a history of MS (neuro Dr Arlice Colt) - last 2 atacks: 02/2012, 2010; also HTN, fatty liver, endometriosis. She had a kidney stone in 11/2016 (calcium oxalate)- had litotripsy. She had a total of 20-25 kidney stones throughout her life. 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  She sees cardiology. She decreased Amlodipine 2/2 itching, leg swelling. She was  started on Diuretic.  She she continues to travel a lot for her job.    ROS: Constitutional: no weight gain/no weight loss, no fatigue, no subjective hyperthermia, no subjective hypothermia Eyes: no blurry vision, no xerophthalmia ENT: no sore throat, no nodules palpated in neck, no dysphagia, no odynophagia, no hoarseness Cardiovascular: no CP/no SOB/no palpitations/no leg swelling Respiratory: no cough/no SOB/no wheezing Gastrointestinal: no N/no V/no D/no C/no acid reflux Musculoskeletal: no muscle aches/no joint aches Skin: no rashes, no hair loss Neurological: no tremors/+ numbness/+ tingling/no dizziness  I reviewed pt's medications, allergies, PMH, social hx, family hx, and changes were documented in the history of present illness. Otherwise, unchanged from my initial visit note.  Past Medical History:  Diagnosis Date  . Arthritis    mild  . Depression   . Diabetes mellitus    Type 2  . Endometriosis   . History of kidney stones   . Hypertension   . Multiple sclerosis ([place])   . Type II or unspecified type diabetes mellitus with peripheral circulatory disorders, uncontrolled(250.72) 11/27/2012   Past Surgical History:  Procedure Laterality Date  . EXPLORATORY LAPAROTOMY     endometriosis  . EXTRACORPOREAL SHOCK WAVE LITHOTRIPSY Right 11/19/2016   Procedure: RIGHT EXTRACORPOREAL SHOCK WAVE LITHOTRIPSY (ESWL);  Surgeon: Festus Aloe, MD;  Location: [facility];  Service: Urology;  Laterality: Right;  . LUMBAR DISC SURGERY     LS spine 2005 or so  . WISDOM TOOTH EXTRACTION     Social History   Socioeconomic History  . Marital status: Single    Spouse name: Not on file  . Number of children: Not on file  . Years of education: Not on file  . Highest education level: Not on file  Occupational History  . Not on file  Tobacco Use  . Smoking status: Never Smoker  . Smokeless tobacco: Never Used  Substance and Sexual Activity  . Alcohol use: Yes    Alcohol/week: 0.0  standard drinks    Comment: occasional  . Drug use: No  . Sexual activity: Not on file  Other Topics Concern  . Not on file  Social History Narrative   Single. Lives alone. No children. Pet cat.       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 amLODipine (NORVASC) 5 MG tablet TAKE 1 TABLET DAILY 180 tablet 3  . atorvastatin (LIPITOR) 20 MG tablet Take 1 tablet (20 mg total) by mouth daily. 90 tablet 3  . carvedilol (COREG) 6.25 MG tablet Take 1 tablet (6.25 mg total) by mouth 2 (two) times daily. 180 tablet 3  . Continuous Blood Gluc Receiver (FREESTYLE LIBRE 14 DAY READER) DEVI 1 kit by Does not apply route See admin instructions. For continuous glucose monitoring 1 each 0  . Continuous Blood Gluc Sensor (FREESTYLE LIBRE 14 DAY SENSOR) MISC CHANGE SENSOR EVERY 14 DAYS 6 each 0  . DULoxetine (CYMBALTA) 60 MG capsule TAKE 1 CAPSULE DAILY 180 capsule 3  . fluticasone (FLONASE) 50 MCG/ACT nasal spray Place 2 sprays into both nostrils as needed.     . hydrochlorothiazide (MICROZIDE) 12.5 MG capsule TAKE 1 CAPSULE DAILY (NEED APPOINTMENT) 180 capsule 3  . Insulin Disposable Pump (V-GO 40) KIT USE ONCE DAILY 90 kit 3  . insulin lispro (HUMALOG) 100 UNIT/ML injection INJECT 76 UNITS UNDER THE SKIN ONCE AS DIRECTED WITH VGO 40 70 mL 3  . Insulin Pen Needle 32G X 4 MM MISC Use 1x a day 100 each 3  . levonorgestrel-ethinyl estradiol (LYBREL,AMETHYST) 90-20 MCG tablet Take 1 tablet by mouth daily.     . metFORMIN  (GLUCOPHAGE-XR) 500 MG 24 hr tablet TAKE 2 TABLETS (1,000 MG TOTAL) TWICE A DAY WITH A MEAL 360 tablet 3  . modafinil (PROVIGIL) 200 MG tablet Take 1 tablet (200 mg total) by mouth daily as needed. 90 tablet 1  . Naproxen Sodium 220 MG CAPS Take 440 mg by mouth 2 (two) times daily as needed (for pain.).     Marland Kitchen natalizumab (TYSABRI) 300 MG/15ML injection Inject 300 mg into the vein every 30 (thirty) days.    . Semaglutide, 1 MG/DOSE, (OZEMPIC, 1 MG/DOSE,) 2 MG/1.5ML SOPN Inject 0.75 mLs (1 mg total) into the skin once a week. 6 pen 3   No current facility-administered medications on file prior to visit.   Allergies  Allergen Reactions  . Ace Inhibitors Swelling    Edema   Family History  Problem Relation Age of Onset  . Breast cancer Mother        early 98s  . Diabetes Mother        maternal grandmother as well  . Diabetes Father   . Other Father        progressive supranuclear palsy    PE: BP 128/78 (BP Location: Right Arm, Patient Position: Sitting, Cuff Size: Normal)   Pulse 76   Ht _0  (1.676 m)   Wt 238 lb 12.8 oz (108.3 kg)   SpO2 97%   BMI 38.54 kg/m  Body mass index is 38.54 kg/m. Wt Readings from Last 3 Encounters:  04/08/20 238 lb 12.8 oz (108.3 kg)  02/29/20 238 lb (108 kg)  12/02/19 235 lb 12.8 oz (107 kg)   Constitutional: overweight, in NAD Eyes: PERRLA, EOMI, no exophthalmos ENT: moist mucous membranes, no thyromegaly, no cervical lymphadenopathy Cardiovascular: tachycardia, RR, No RG, +1/6 SEM Respiratory: CTA B Gastrointestinal: abdomen soft, NT, ND, BS+ Musculoskeletal: no deformities, strength intact in all 4 Skin: moist, warm, no rashes Neurological: no tremor with outstretched hands, DTR normal in all 4  ASSESSMENT: 1. DM2, insulin-dependent, uncontrolled, with complications: - DR  She saw the diabetes educator for a discussion about insulin pumps >> decided for a  VGo  2. Obesity class 2 BMI Classification:  < 18.5 underweight    18.5-24.9 normal weight   25.0-29.9 overweight   30.0-34.9 class I obesity   35.0-39.9 class II obesity   ? 40.0 class III obesity   3. HL  PLAN:  1. Patient with history of uncontrolled type 2 diabetes, exacerbated by steroid courses for MS and also extensive traveling for work in the past.  She is not traveling anymore.  She is on a Vgo 40 patch pump, weekly GLP-1 receptor agonist and Metformin ER.  At last visit we stopped glipizide as she was dropping her blood sugars overnight and after breakfast.  At that time, I advised her to only use Humalog, 6 to 10 units before larger meals or fasting.  At that time, HbA1c was better, at 7.0%. CGM interpretation: -At today's visit, we reviewed her CGM downloads: It appears that 64% of values are in target range (goal >70%), while 32% are higher than 180 (goal <25%), and for % are lower than 70 (goal <4%).  The calculated average blood sugar is 157.  The projected HbA1c for the next 3 months (GMI) is 7.1%. -Reviewing the CGM trends, the sugars appear to be more controlled in the course of the day but they still increase slightly after breakfast, they increase more after lunch and they are consistently elevated after dinner.  Therefore, we discussed about trying to bolus slightly higher doses with the meals blood sugars to try to move Humalog 15 minutes before each meal, since now she is bolusing at the start of the meal.  We did discuss about why this is important and also discussed about the role of insulin resistance in the postprandial increase in blood sugars. -Otherwise, we can continue and Ozempic, which she is tolerating well. -  I advised her to:  Patient Instructions  Please continue: - Metformin ER 1000 mg 2x a day - Ozempic 1 mg weekly  Change  VGo 40 with Humalog 15 min before meals: 2-5 clicks before b'fast 5-6 clicks before lunch 0-03 clicks before dinner  Please return in 4 months.  - we checked her HbA1c: 6.9%  (improved) - advised to check sugars at different times of the day - 4x a day, rotating check times - advised for yearly eye exams >> she is UTD - will check annual labs at next visit - return to clinic in 4 months  2. Obesity class 2 -She lost 7 pounds before last visit -Continue the GLP-1 receptor agonist which should also help with weight loss -She gained 3 pounds since last visit  3. HL -Reviewed latest lipid panel from 03/2019: LDL much improved, at goal monitoring try slightly above target, HDL low: Lab Results  Component Value Date   CHOL 100 03/30/2019   HDL 31.90 (L) 03/30/2019   LDLCALC 33 03/30/2019   LDLDIRECT 125.0 08/21/2017   TRIG 177.0 (H) 03/30/2019   CHOLHDL 3 03/30/2019  -She tried pravastatin and simvastatin but she did not like how this made her feel.  We changed to Lipitor 40, now tolerated well. -We will check a lipid panel at next visit  [name], MD PhD [facility] Endocrinology

## 2020-04-08 NOTE — Patient Instructions (Addendum)
Please continue: - Metformin ER 1000 mg 2x a day - Ozempic 1 mg weekly  Change  VGo 40 with Humalog 15 min before meals: 2-5 clicks before b'fast 5-6 clicks before lunch 8-59 clicks before dinner  Please return in 4 months.

## 2020-04-11 ENCOUNTER — Telehealth: Payer: Self-pay | Admitting: *Deleted

## 2020-04-11 LAB — POCT GLYCOSYLATED HEMOGLOBIN (HGB A1C): Hemoglobin A1C: 6.9 % — AB (ref 4.0–5.6)

## 2020-04-11 NOTE — Telephone Encounter (Signed)
Faxed completed/signed Tysabri pt status report and reauth questionnaire to [facility] at [phone]. Received confirmation.   Received fax from touch prescribing program that pt re-authorized from 04/11/20-11/13/20. Pt enrollment number [id number]. Account: [facility]. Site auth number: [id number].

## 2020-04-11 NOTE — Addendum Note (Signed)
Addended by: Lauralyn Primes on: 04/11/2020 04:14 PM   Modules accepted: Orders

## 2020-04-18 DIAGNOSIS — G35 Multiple sclerosis: Secondary | ICD-10-CM | POA: Diagnosis not present

## 2020-04-25 DIAGNOSIS — Z87442 Personal history of urinary calculi: Secondary | ICD-10-CM | POA: Diagnosis not present

## 2020-04-25 DIAGNOSIS — R3911 Hesitancy of micturition: Secondary | ICD-10-CM | POA: Diagnosis not present

## 2020-05-18 ENCOUNTER — Other Ambulatory Visit: Payer: Self-pay | Admitting: Internal Medicine

## 2020-05-18 ENCOUNTER — Other Ambulatory Visit: Payer: Self-pay | Admitting: Family Medicine

## 2020-05-30 DIAGNOSIS — G35 Multiple sclerosis: Secondary | ICD-10-CM | POA: Diagnosis not present

## 2020-07-11 DIAGNOSIS — G35 Multiple sclerosis: Secondary | ICD-10-CM | POA: Diagnosis not present

## 2020-07-22 ENCOUNTER — Other Ambulatory Visit: Payer: Self-pay

## 2020-07-22 ENCOUNTER — Encounter: Payer: Self-pay | Admitting: Family Medicine

## 2020-07-22 ENCOUNTER — Ambulatory Visit (INDEPENDENT_AMBULATORY_CARE_PROVIDER_SITE_OTHER): Payer: BC Managed Care – PPO | Admitting: Family Medicine

## 2020-07-22 VITALS — BP 146/94 | HR 84 | Temp 97.6°F | Ht 66.0 in | Wt 232.0 lb

## 2020-07-22 DIAGNOSIS — I10 Essential (primary) hypertension: Secondary | ICD-10-CM | POA: Diagnosis not present

## 2020-07-22 DIAGNOSIS — E1151 Type 2 diabetes mellitus with diabetic peripheral angiopathy without gangrene: Secondary | ICD-10-CM | POA: Diagnosis not present

## 2020-07-22 DIAGNOSIS — D849 Immunodeficiency, unspecified: Secondary | ICD-10-CM

## 2020-07-22 DIAGNOSIS — G35 Multiple sclerosis: Secondary | ICD-10-CM | POA: Diagnosis not present

## 2020-07-22 DIAGNOSIS — F325 Major depressive disorder, single episode, in full remission: Secondary | ICD-10-CM | POA: Insufficient documentation

## 2020-07-22 DIAGNOSIS — Z1211 Encounter for screening for malignant neoplasm of colon: Secondary | ICD-10-CM

## 2020-07-22 DIAGNOSIS — Z1159 Encounter for screening for other viral diseases: Secondary | ICD-10-CM | POA: Diagnosis not present

## 2020-07-22 DIAGNOSIS — F324 Major depressive disorder, single episode, in partial remission: Secondary | ICD-10-CM | POA: Diagnosis not present

## 2020-07-22 LAB — CBC WITH DIFFERENTIAL/PLATELET
Basophils Absolute: 0.1 10*3/uL (ref 0.0–0.1)
Basophils Relative: 0.8 % (ref 0.0–3.0)
Eosinophils Absolute: 0.7 10*3/uL (ref 0.0–0.7)
Eosinophils Relative: 6 % — ABNORMAL HIGH (ref 0.0–5.0)
HCT: 40.3 % (ref 36.0–46.0)
Hemoglobin: 13.2 g/dL (ref 12.0–15.0)
Lymphocytes Relative: 44.5 % (ref 12.0–46.0)
Lymphs Abs: 5.2 10*3/uL — ABNORMAL HIGH (ref 0.7–4.0)
MCHC: 32.7 g/dL (ref 30.0–36.0)
MCV: 84.3 fl (ref 78.0–100.0)
Monocytes Absolute: 0.5 10*3/uL (ref 0.1–1.0)
Monocytes Relative: 4.6 % (ref 3.0–12.0)
Neutro Abs: 5.1 10*3/uL (ref 1.4–7.7)
Neutrophils Relative %: 44.1 % (ref 43.0–77.0)
Platelets: 352 10*3/uL (ref 150.0–400.0)
RBC: 4.78 Mil/uL (ref 3.87–5.11)
RDW: 15.3 % (ref 11.5–15.5)
WBC: 11.6 10*3/uL — ABNORMAL HIGH (ref 4.0–10.5)

## 2020-07-22 LAB — COMPREHENSIVE METABOLIC PANEL
ALT: 24 U/L (ref 0–35)
AST: 19 U/L (ref 0–37)
Albumin: 3.8 g/dL (ref 3.5–5.2)
Alkaline Phosphatase: 72 U/L (ref 39–117)
BUN: 13 mg/dL (ref 6–23)
CO2: 29 mEq/L (ref 19–32)
Calcium: 8.7 mg/dL (ref 8.4–10.5)
Chloride: 101 mEq/L (ref 96–112)
Creatinine, Ser: 0.93 mg/dL (ref 0.40–1.20)
GFR: 71.43 mL/min (ref >60.00)
Glucose, Bld: 209 mg/dL — ABNORMAL HIGH (ref 70–99)
Potassium: 3.5 mEq/L (ref 3.5–5.1)
Sodium: 139 mEq/L (ref 135–145)
Total Bilirubin: 0.3 mg/dL (ref 0.2–1.2)
Total Protein: 6.6 g/dL (ref 6.0–8.3)

## 2020-07-22 LAB — LIPID PANEL
Cholesterol: 106 mg/dL (ref 0–200)
HDL: 35.9 mg/dL — ABNORMAL LOW (ref >39.00)
LDL Cholesterol: 39 mg/dL (ref 0–99)
NonHDL: 69.99
Total CHOL/HDL Ratio: 3
Triglycerides: 153 mg/dL — ABNORMAL HIGH (ref 0.0–149.0)
VLDL: 30.6 mg/dL (ref 0.0–40.0)

## 2020-07-22 LAB — MICROALBUMIN / CREATININE URINE RATIO
Creatinine,U: 171.6 mg/dL
Microalb Creat Ratio: 1.7 mg/g (ref 0.0–30.0)
Microalb, Ur: 2.9 mg/dL — ABNORMAL HIGH (ref 0.0–1.9)

## 2020-07-22 MED ORDER — CARVEDILOL 12.5 MG PO TABS
12.5000 mg | ORAL_TABLET | Freq: Two times a day (BID) | ORAL | 3 refills | Status: DC
Start: 2020-07-22 — End: 2021-06-29

## 2020-07-22 NOTE — Progress Notes (Signed)
Phone [phone] In person visit   Subjective:   Kristin Mathews is a 50 y.o. year old very pleasant female patient who presents for/with See problem oriented charting Chief Complaint  Patient presents with   Hypertension    This visit occurred during the SARS-CoV-2 public health emergency.  Safety protocols were in place, including screening questions prior to the visit, additional usage of staff PPE, and extensive cleaning of exam room while observing appropriate contact time as indicated for disinfecting solutions.   Past Medical History-  Patient Active Problem List   Diagnosis Date Noted   Aortic aneurysm ([place]) 08/21/2017    Priority: High   Pulmonary nodule 08/21/2017    Priority: High   Immunosuppressed status ([place]) 03/05/2017    Priority: High   Uncontrolled type 2 diabetes mellitus with peripheral circulatory disorder ([place]) 11/27/2012    Priority: High   Relapsing remitting multiple sclerosis ([place]) 07/05/2006    Priority: High   Nephrolithiasis 08/21/2016    Priority: Medium   Depression 02/25/2008    Priority: Medium   Hyperlipidemia 08/29/2006    Priority: Medium   Essential hypertension 07/05/2006    Priority: Medium   Gait disturbance 04/17/2016    Priority: Low   Tingling 04/17/2016    Priority: Low   Snoring 04/17/2016    Priority: Low   Urinary urgency 04/17/2016    Priority: Low   Excessive sleepiness 04/17/2016    Priority: Low   Allergic rhinitis 04/27/2014    Priority: Low   Low back pain 04/27/2014    Priority: Low   Endometriosis 07/05/2006    Priority: Low   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  90 tablet 3   carvedilol (COREG) 6.25 MG tablet Take 1 tablet (6.25 mg total) by mouth 2 (two) times daily. 180 tablet 3   Continuous Blood Gluc Receiver (FREESTYLE LIBRE 14 DAY READER) DEVI 1 kit by Does not apply route See admin instructions. For continuous glucose monitoring 1 each 0   Continuous Blood Gluc Sensor (FREESTYLE LIBRE 14 DAY SENSOR) MISC CHANGE SENSOR EVERY 14 DAYS 6 each 3   DULoxetine (CYMBALTA) 60 MG capsule TAKE 1 CAPSULE DAILY 180 capsule 3   fluticasone (FLONASE) 50 MCG/ACT nasal spray Place 2 sprays into both nostrils as needed.      hydrochlorothiazide (MICROZIDE) 12.5 MG capsule TAKE 1 CAPSULE DAILY (NEED APPOINTMENT) 180 capsule 3   Insulin Disposable Pump (V-GO 40) KIT USE ONCE DAILY 90 kit 3   insulin lispro (HUMALOG) 100 UNIT/ML injection INJECT 76 UNITS UNDER THE SKIN ONCE AS DIRECTED WITH VGO 40 70 mL 3   Insulin Pen Needle 32G X 4 MM MISC Use 1x a day 100 each 3   levonorgestrel-ethinyl estradiol (LYBREL,AMETHYST) 90-20 MCG tablet Take 1 tablet by mouth daily.      Melatonin 5 MG CAPS Take by mouth.     metFORMIN (GLUCOPHAGE-XR) 500 MG 24 hr tablet TAKE 2 TABLETS (1,000 MG TOTAL) TWICE A DAY WITH A MEAL 360 tablet 3   modafinil (PROVIGIL) 200 MG tablet Take 1 tablet (200 mg total) by mouth daily as needed. 90 tablet 1   Naproxen Sodium  220 MG CAPS Take 440 mg by mouth 2 (two) times daily as needed (for pain.).      natalizumab (TYSABRI) 300 MG/15ML injection Inject 300 mg into the vein every 30 (thirty) days.     Probiotic Product (PROBIOTIC PO) Take by mouth.     Semaglutide, 1 MG/DOSE, (OZEMPIC, 1 MG/DOSE,) 2 MG/1.5ML SOPN Inject 0.75 mLs (1 mg total) into the skin once a week. 6 pen 3   No current facility-administered medications for this visit.     Objective:  BP (!) 146/94   Pulse 84   Temp 97.6 F (36.4 C) (Temporal)   Ht _0  (1.676 m)   Wt 232 lb (105.2 kg)   SpO2 97%   BMI 37.45 kg/m  Gen: NAD, resting comfortably CV: RRR stable  murmur Lungs: CTAB no crackles, wheeze, rhonchi Abdomen: soft/nontender/nondistended/normal bowel sounds. No rebound or guarding.  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 In addition to this has some relationship issues.   #hypertension S: medication: Amlodipine 5 mg (has not tolerated 10 mg in the past), carvedilol 6.125 mg twice daily, HCTZ 12.5 mg Home readings #s: typically 140s or 150s over 90s BP Readings from Last 3 Encounters:  07/22/20 (!) 146/94  04/08/20 128/78  02/29/20 (!) 151/82  A/P: Poor control in office and at home.  We are going to continue amlodipine 5 mg and hydrochlorothiazide 12.5 mg.  We are going to increase carvedilol to 12.5 mg twice daily.  I asked her to update me with home readings in perhaps 2 or 3 weeks- may need to increase hydrochlorothiazide to 25 mg as well but will do that as second step - as stressors improve she will monitor BP and let me know if trends back down and we could consider going back down on dose  #hyperlipidemia S: Medication:atorvastatin 20 mg  Lab Results  Component Value Date   CHOL 100 03/30/2019   HDL 31.90 (L) 03/30/2019   LDLCALC 33 03/30/2019   LDLDIRECT 125.0 08/21/2017   TRIG 177.0 (H) 03/30/2019   CHOLHDL 3 03/30/2019   A/P: hopefully stable or improved- update lipid panel today. Continue current meds for now   # Depression S: Medication:Cymbalta 60 mg Depression screen [facility] 2/9 07/22/2020 10/26/2019 03/13/2018  Decreased Interest 0 0 0  Down, Depressed, Hopeless 2 0 0  PHQ - 2 Score 2 0 0  Altered sleeping 1 - 1  Tired, decreased energy 3 - 2  Change in appetite 0 - 2  Feeling bad or failure about yourself  1 - 0  Trouble concentrating 0 - 0  Moving slowly or fidgety/restless 0 - 0  Suicidal thoughts 0 - 0  PHQ-9 Score 7 - 5  Difficult doing work/chores Somewhat difficult -  Somewhat difficult  A/P: event driven- we opted not ot make change in meds with only mild poor control- she will reach out if worsening symptoms -she may pursue therapy/counseling- I think that's a great idea  % Diabetes-continues to follow-up with Dr. Cherre Huger on metformin and insulin and Ozempic and has CGM Lab Results  Component Value Date   HGBA1C 6.9 (A) 04/08/2020    % MS/immunosuppressed status-on Tysabri doing well.  Immunosuppressed status noted.  Continue current medications and neurology follow-up -needs JCV testing updated -Provigil also prescribed by Dr. Felecia Shelling  #Aortic aneurysm-stable on CT angiogram  12/02/2019.  Has been seen by vascular surgery -Aneurysm stable at 4 cm or less-stopped previously overestimated-they actually recommended no repeat imaging - will try to lower BP still  -Pulmonary nodule noted stable at 6 mm and they recommended 1 final follow-up 18 to 24 months out from May 2020 visit- we discussed we could ordre this if she doesn't hear by our October visit- was told she would be called by their office    #Zoster Vaccines- Shingrix (1 of 2) could not walk for 30 hours after first vaccine- recommended avoiding 2nd vaccine - had 2 falls during that time. Also had significant chills and bladder control issues - possible GBS- discuss with Dr. Felecia Shelling - never had issues with flu shot -gradually improved  Recommended follow up: keep October visit Future Appointments  Date Time Provider [place]  08/29/2020 11:30 AM Sater, Nanine Means, MD [facility] None  09/02/2020  3:20 PM [name], MD [facility] None  10/31/2020  4:00 PM Marin Olp, MD [facility]   Lab/Order associations:   ICD-10-CM   1. Controlled diabetes mellitus with peripheral circulatory disorder (HCC)  E11.51     2. Relapsing remitting multiple sclerosis ([place])  G35     3. Essential hypertension  I10     4. Screen for colon cancer  Z12.11     5. Encounter for hepatitis  C screening test for low risk patient  Z11.59       No orders of the defined types were placed in this encounter.   Return precautions advised.  Garret Reddish, MD

## 2020-07-22 NOTE — Patient Instructions (Addendum)
Health Maintenance Due  Topic Date Due   Zoster Vaccines- Shingrix (1 of 2) added as allergy. 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 Felecia Shelling prior to 2nd immunization 07/30/1988   COLONOSCOPY If you do not receive a call within 2 weeks please call  [facility]  Address: [address], [place], [place] [address] Phone: [phone]  03/08/2018   MAMMOGRAM Sign release of information at the check out desk for GYN to send [place] your mammogram results.  12/28/2019   URINE MICROALBUMIN Done today in office if able to urinate 03/29/2020   Poor control in office and at home.  We are going to continue amlodipine 5 mg and hydrochlorothiazide 12.5 mg.  We are going to increase carvedilol to 12.5 mg twice daily.  I asked her to update me with home readings in perhaps 2 or 3 weeks- may need to increase hydrochlorothiazide to 25 mg as well but will do that as second step  Recommended follow up: keep October visit

## 2020-07-28 LAB — STRATIFY JCV AB (W/ INDEX) W/ RFLX
Index Value: 0.25 — ABNORMAL HIGH
Stratify JCV (TM) Ab w/Reflex Inhibition: UNDETERMINED — AB

## 2020-07-28 LAB — HEPATITIS C ANTIBODY
Hepatitis C Ab: NONREACTIVE
SIGNAL TO CUT-OFF: 0.01 (ref <1.00)

## 2020-07-28 LAB — RFLX STRATIFY JCV (TM) AB INHIBITION: JCV Antibody by Inhibition: NEGATIVE

## 2020-07-29 ENCOUNTER — Telehealth: Payer: Self-pay | Admitting: Neurology

## 2020-07-29 NOTE — Telephone Encounter (Signed)
JCV Ab test 07/22/2020 was negative (titer 0.25 with negative reflex inhibition assay)

## 2020-08-09 ENCOUNTER — Other Ambulatory Visit: Payer: Self-pay | Admitting: Internal Medicine

## 2020-08-22 DIAGNOSIS — G35 Multiple sclerosis: Secondary | ICD-10-CM | POA: Diagnosis not present

## 2020-08-29 ENCOUNTER — Telehealth: Payer: Self-pay | Admitting: *Deleted

## 2020-08-29 ENCOUNTER — Encounter: Payer: Self-pay | Admitting: Neurology

## 2020-08-29 ENCOUNTER — Other Ambulatory Visit: Payer: Self-pay

## 2020-08-29 ENCOUNTER — Ambulatory Visit (INDEPENDENT_AMBULATORY_CARE_PROVIDER_SITE_OTHER): Payer: BC Managed Care – PPO | Admitting: Neurology

## 2020-08-29 VITALS — BP 156/92 | HR 83 | Ht 66.0 in | Wt 234.0 lb

## 2020-08-29 DIAGNOSIS — Z79899 Other long term (current) drug therapy: Secondary | ICD-10-CM

## 2020-08-29 DIAGNOSIS — R269 Unspecified abnormalities of gait and mobility: Secondary | ICD-10-CM

## 2020-08-29 DIAGNOSIS — G4710 Hypersomnia, unspecified: Secondary | ICD-10-CM | POA: Diagnosis not present

## 2020-08-29 DIAGNOSIS — G35 Multiple sclerosis: Secondary | ICD-10-CM | POA: Diagnosis not present

## 2020-08-29 DIAGNOSIS — E119 Type 2 diabetes mellitus without complications: Secondary | ICD-10-CM

## 2020-08-29 MED ORDER — MODAFINIL 200 MG PO TABS
200.0000 mg | ORAL_TABLET | Freq: Every day | ORAL | 1 refills | Status: DC | PRN
Start: 2020-08-29 — End: 2021-03-06

## 2020-08-29 NOTE — Telephone Encounter (Signed)
Submitted PA modafinil on via fax to [facility] at [phone]. Received fax confirmation, waiting on determination

## 2020-08-29 NOTE — Telephone Encounter (Signed)
Received fax from [facility] that PA approved 07/30/20-08/29/21. Case ID: [id number].

## 2020-08-29 NOTE — Progress Notes (Signed)
[facility]  PATIENT: Kristin Mathews DOB: [birth year]  REFERRING CLINICIAN: Dr. Leanne Chang  HISTORY FROM: Patient REASON FOR VISIT: MS   HISTORICAL  CHIEF COMPLAINT:  Chief Complaint  Patient presents with   Follow-up    [name], alone. Here for 6 month MS f/u, on Tysabri. Pt reports doing well no new or worsening in sx. Pt states she had her first Shingrix vaccine and had severe reaction; extreme weakness in legs where she was unable to walk and inability to control bladder. Pt is asking to see if she should take her 2nd shot.     HISTORY OF PRESENT ILLNESS:  Kristin Mathews is a 50 y.o. woman with relapsing remitting MS dx 2001.  Update 10/02/2018: She continues on Tysabri for RRMS .  She tolerates it well and has not had any exacerbations.  She was JCV antibody negative when last checked 07/29/2020 and was negativeat 0.25. Marland Kitchen   Her last brain MRI 01/20/2018 showed an overall small plaque burden.  We have no recent MRI of the cervical spine.  Gait is ok on a flat surface but balance is off and she uses the banister to go up and down stairs. She has had a couple falls either because of reduced balance or foot drop.  Strength and sensation is fine imost of the time in arms and legs.  She denies any difficulty with her bladder.  Vision function is ok but she has had diabetic retinopathy that is being followed.    She notes more fatigue and a couple days needed to take a nap.    She is sleeping well most nights (6-8 hours during the work week and 12-15 hours on weekends).   She has had a PSG in the past and she had no OSA.    She denies any difficulties with cognition.  Her mood is fine.  She had her first Singrix vaccination in June concurrent with her covid booster.   She felt weaker x 48 hours and had bladder control issues.     MS History: She presented with unilateral numbness in May 2001 and was diagnosed with transverse myelitis.    MRI only showed one spot and an LP was  non-diagnostic.   She had another episode of numbness 6 months later and MRI showed several newer lesions.    She was placed on REbif as part of the Rebiject study.     She had an exacerbation in 2007 and in 2010 with numbness and then optic neuritis.   In 2015, she had another numbness exacerbation and we decided to switch to Tysabri.    She tolerates Tysabri well.    Imaging: MRI of the brain 01/20/2018 showed T2/flair hyperintense foci in the periventricular, juxtacortical and deep white matter.  These are consistent with chronic demyelinating plaque associated with multiple sclerosis though some of the foci are nonspecific.  REVIEW OF SYSTEMS:  Constitutional: No fevers, chills, sweats, or change in appetite.   Has fatigue.  Has Poor sleep Eyes: No visual changes, double vision, eye pain Ear, nose and throat: No hearing loss, ear pain, nasal congestion, sore throat Cardiovascular: No chest pain, palpitations Respiratory:  No shortness of breath at rest or with exertion.   No wheezes.   Snoring GastrointestinaI: No nausea, vomiting, diarrhea, abdominal pain.   Rare fecal incontinence Genitourinary:    She reports urgency with rare incontinence Musculoskeletal:  No neck pain, back pain Integumentary: No rash, pruritus, skin lesions Neurological: as  above Psychiatric: No depression at this time.  No anxiety Endocrine: No palpitations, diaphoresis, change in appetite, change in weigh or increased thirst Hematologic/Lymphatic:  No anemia, purpura, petechiae. Allergic/Immunologic: No itchy/runny eyes, nasal congestion, recent allergic reactions, rashes  ALLERGIES: Allergies  Allergen Reactions   Ace Inhibitors Swelling    Edema   Shingrix [Zoster Vac Recomb Adjuvanted]     ould not walk for 30 hours after first vaccine- recommended avoiding 2nd vaccine - had 2 falls during that time - possible GBS- discuss with Dr. Felecia Shelling - never had issues with flu shot -gradually improved     HOME  MEDICATIONS: Outpatient Medications Prior to Visit  Medication Sig Dispense Refill   amLODipine (NORVASC) 5 MG tablet TAKE 1 TABLET DAILY 180 tablet 3   atorvastatin (LIPITOR) 20 MG tablet Take 1 tablet (20 mg total) by mouth daily. 90 tablet 3   carvedilol (COREG) 12.5 MG tablet Take 1 tablet (12.5 mg total) by mouth 2 (two) times daily. 180 tablet 3   Continuous Blood Gluc Receiver (FREESTYLE LIBRE 14 DAY READER) DEVI 1 kit by Does not apply route See admin instructions. For continuous glucose monitoring 1 each 0   Continuous Blood Gluc Sensor (FREESTYLE LIBRE 14 DAY SENSOR) MISC CHANGE SENSOR EVERY 14 DAYS 6 each 3   DULoxetine (CYMBALTA) 60 MG capsule TAKE 1 CAPSULE DAILY 180 capsule 3   fluticasone (FLONASE) 50 MCG/ACT nasal spray Place 2 sprays into both nostrils as needed.      hydrochlorothiazide (MICROZIDE) 12.5 MG capsule TAKE 1 CAPSULE DAILY (NEED APPOINTMENT) 180 capsule 3   Insulin Disposable Pump (V-GO 40) KIT USE ONCE DAILY 90 kit 3   insulin lispro (HUMALOG) 100 UNIT/ML injection INJECT 76 UNITS UNDER THE SKIN ONCE AS DIRECTED WITH VGO 40 70 mL 3   Insulin Pen Needle 32G X 4 MM MISC Use 1x a day 100 each 3   levonorgestrel-ethinyl estradiol (LYBREL,AMETHYST) 90-20 MCG tablet Take 1 tablet by mouth daily.      Melatonin 5 MG CAPS Take by mouth.     metFORMIN (GLUCOPHAGE-XR) 500 MG 24 hr tablet TAKE 2 TABLETS (1,000 MG TOTAL) TWICE A DAY WITH A MEAL 360 tablet 3   Naproxen Sodium 220 MG CAPS Take 440 mg by mouth 2 (two) times daily as needed (for pain.).      natalizumab (TYSABRI) 300 MG/15ML injection Inject 300 mg into the vein every 30 (thirty) days.     OZEMPIC, 1 MG/DOSE, 4 MG/3ML SOPN INJECT 1 MG UNDER THE SKIN ONCE A WEEK 9 mL 3   Probiotic Product (PROBIOTIC PO) Take by mouth.     modafinil (PROVIGIL) 200 MG tablet Take 1 tablet (200 mg total) by mouth daily as needed. 90 tablet 1   No facility-administered medications prior to visit.    PAST MEDICAL HISTORY: Past  Medical History:  Diagnosis Date   Arthritis    mild   Depression    Diabetes mellitus    Type 2   Endometriosis    History of kidney stones    Hypertension    Multiple sclerosis ([place])    Type II or unspecified type diabetes mellitus with peripheral circulatory disorders, uncontrolled(250.72) 11/27/2012    PAST SURGICAL HISTORY: Past Surgical History:  Procedure Laterality Date   EXPLORATORY LAPAROTOMY     endometriosis   EXTRACORPOREAL SHOCK WAVE LITHOTRIPSY Right 11/19/2016   Procedure: RIGHT EXTRACORPOREAL SHOCK WAVE LITHOTRIPSY (ESWL);  Surgeon: Festus Aloe, MD;  Location: [facility];  Service: Urology;  Laterality: Right;   LUMBAR DISC SURGERY     LS spine 2005 or so   WISDOM TOOTH EXTRACTION      FAMILY HISTORY: Family History  Problem Relation Age of Onset   Breast cancer Mother        early 39s   Diabetes Mother        maternal grandmother as well   Diabetes Father    Other Father        progressive supranuclear palsy    SOCIAL HISTORY:  Social History   Socioeconomic History   Marital status: Single    Spouse name: Not on file   Number of children: Not on file   Years of education: Not on file   Highest education level: Not on file  Occupational History   Not on file  Tobacco Use   Smoking status: Never   Smokeless tobacco: Never  Substance and Sexual Activity   Alcohol use: Yes    Alcohol/week: 0.0 standard drinks    Comment: occasional   Drug use: No   Sexual activity: Not on file  Other Topics Concern   Not on file  Social History Narrative   Single. Lives alone. No children. Pet cat.       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   General: The patient is well-developed and well-nourished and in no acute distress   Neurologic Exam  Mental status: The patient is alert and oriented x 3 at the time of the examination. The patient has apparent normal recent and remote memory, with an apparently normal attention span and concentration ability.   Speech is normal.  Cranial nerves: Extraocular movements are full.  Her vision and, vision is symmetric.Marland Kitchen There is mild reduced right facial sensation to soft touch.  Facial strength is normal.  Trapezius and sternocleidomastoid strength is normal. No dysarthria is noted.   no obvious hearing deficits are noted.  Motor:  Muscle bulk and tone are normal. Strength is  5 / 5 in all 4 extremities.   Sensory: Sensory testing is intact to pinprick, soft touch, vibration sensation, and position sense in her hands and knees  Coordination: Cerebellar testing reveals Good finger-to-nose and heel-to-shin bilaterally.  Gait and station: Station is normal.  Gait is mildly wide..  The tandem gait is wide.  Romberg is negative. Reflexes: Deep tendon reflexes are symmetric and normal bilaterally.        DIAGNOSTIC DATA (LABS, IMAGING, TESTING) - I reviewed patient records, labs, notes, testing and imaging myself where available.  Lab Results  Component Value Date   WBC 11.6 (H) 07/22/2020   HGB 13.2 07/22/2020   HCT 40.3 07/22/2020   MCV 84.3 07/22/2020   PLT 352.0 07/22/2020      Component Value Date/Time   NA 139 07/22/2020  1347   NA 138 12/31/2017 1421   K 3.5 07/22/2020 1347   CL 101 07/22/2020 1347   CO2 29 07/22/2020 1347   GLUCOSE 209 (H) 07/22/2020 1347   BUN 13 07/22/2020 1347   BUN 14 12/31/2017 1421   CREATININE  0.93 07/22/2020 1347   CREATININE 0.73 04/30/2014 1109   CALCIUM 8.7 07/22/2020 1347   PROT 6.6 07/22/2020 1347   PROT 6.6 12/31/2017 1421   ALBUMIN 3.8 07/22/2020 1347   ALBUMIN 4.2 12/31/2017 1421   AST 19 07/22/2020 1347   ALT 24 07/22/2020 1347   ALKPHOS 72 07/22/2020 1347   BILITOT 0.3 07/22/2020 1347   BILITOT 0.2 12/31/2017 1421   GFRNONAA 78 12/31/2017 1421   GFRNONAA >89 04/30/2014 1109   GFRAA 90 12/31/2017 1421   GFRAA >89 04/30/2014 1109   Lab Results  Component Value Date   CHOL 106 07/22/2020   HDL 35.90 (L) 07/22/2020   LDLCALC 39 07/22/2020   LDLDIRECT 125.0 08/21/2017   TRIG 153.0 (H) 07/22/2020   CHOLHDL 3 07/22/2020   Lab Results  Component Value Date   HGBA1C 6.9 (A) 04/08/2020   No results found for: VITAMINB12 Lab Results  Component Value Date   TSH 2.97 04/22/2018       ASSESSMENT AND PLAN  Relapsing remitting multiple sclerosis ([place]) - Plan: MR BRAIN WO CONTRAST, MR CERVICAL SPINE WO CONTRAST, MR THORACIC SPINE WO CONTRAST  Gait disturbance - Plan: MR BRAIN WO CONTRAST, MR CERVICAL SPINE WO CONTRAST, MR THORACIC SPINE WO CONTRAST  High risk medication use  Excessive sleepiness  Type 2 diabetes mellitus without complication, unspecified whether long term insulin use (HCC)   1.   Continue Tysabri.  She is JCV ab negative. .    She has done well on Tysabri with no exacerbations.   Due to increased symptoms we need to check an MRI of the brain and spine to determine if there is any subclinical progression.  If this is occurring, consider a different disease modifying therapy. 2.   Stay active and exercise as tolerated.   3     continue Provigil for hypersomnia 4.   she will return to see me in 5-6 months if stable or sooner if she is found to have obstructive sleep apnea or if she notes other new or worsening symptoms.  Paxon Propes A. Felecia Shelling, MD, PhD 4/58/5929, 2:44 PM Certified in Neurology, Clinical Neurophysiology, Sleep Medicine, Pain  Medicine and Neuroimaging  [facility] [address] [place], [place] [address] [phone]

## 2020-08-30 ENCOUNTER — Telehealth: Payer: Self-pay | Admitting: Neurology

## 2020-08-30 NOTE — Telephone Encounter (Signed)
LVM for pt to call back about scheduling mri  [employer] auth: [id number] (exp. 08/30/20 to 09/28/20)

## 2020-09-02 ENCOUNTER — Encounter: Payer: Self-pay | Admitting: Internal Medicine

## 2020-09-02 ENCOUNTER — Ambulatory Visit (INDEPENDENT_AMBULATORY_CARE_PROVIDER_SITE_OTHER): Payer: BC Managed Care – PPO | Admitting: Internal Medicine

## 2020-09-02 ENCOUNTER — Other Ambulatory Visit: Payer: Self-pay

## 2020-09-02 VITALS — BP 148/100 | HR 87 | Ht 66.0 in | Wt 232.8 lb

## 2020-09-02 DIAGNOSIS — E785 Hyperlipidemia, unspecified: Secondary | ICD-10-CM | POA: Diagnosis not present

## 2020-09-02 DIAGNOSIS — E1151 Type 2 diabetes mellitus with diabetic peripheral angiopathy without gangrene: Secondary | ICD-10-CM | POA: Diagnosis not present

## 2020-09-02 DIAGNOSIS — E1165 Type 2 diabetes mellitus with hyperglycemia: Secondary | ICD-10-CM | POA: Diagnosis not present

## 2020-09-02 DIAGNOSIS — E669 Obesity, unspecified: Secondary | ICD-10-CM

## 2020-09-02 DIAGNOSIS — IMO0002 Reserved for concepts with insufficient information to code with codable children: Secondary | ICD-10-CM

## 2020-09-02 LAB — POCT GLYCOSYLATED HEMOGLOBIN (HGB A1C): Hemoglobin A1C: 7.4 % — AB (ref 4.0–5.6)

## 2020-09-02 MED ORDER — V-GO 30 KIT: PACK | 11 refills | Status: DC

## 2020-09-02 NOTE — Progress Notes (Signed)
Patient ID: Kristin Mathews, female   DOB: 02-Dec-1970, 50 y.o.   MRN: [id number]  This visit occurred during the SARS-CoV-2 public health emergency.  Safety protocols were in place, including screening questions prior to the visit, additional usage of staff PPE, and extensive cleaning of exam room while observing appropriate contact time as indicated for disinfecting solutions.   HPI: Kristin Mathews is a 50 y.o.-year-old female, presenting for f/u for DM2, dx 2009, insulin-dependent since 2009, uncontrolled, with complications (diabetic retinopathy). Last visit 5 months ago.  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  She is very busy at work now.  She may start traveling again - to [place] next month. No increased urination, blurry vision, nausea, chest pain.  Reviewed HbA1c levels: Lab Results  Component Value Date   HGBA1C 6.9 (A) 04/08/2020   HGBA1C 7.0 (A) 10/26/2019   HGBA1C 7.4 (A) 06/29/2019   HGBA1C 8.7 (A) 03/30/2019   HGBA1C 8.4 (A) 04/22/2018   HGBA1C 7.9 (A) 10/07/2017   HGBA1C 9 01/31/2017   HGBA1C 8.3 (H) 08/21/2016   HGBA1C 7.6 01/23/2016   HGBA1C 9.0 09/26/2015   HGBA1C 8.5 (H) 02/22/2015   HGBA1C 8.2 08/03/2014   HGBA1C 8.6 (H) 04/30/2014   HGBA1C 9.3 (H) 11/02/2013   HGBA1C 7.1 (H) 04/14/2013   HGBA1C 8.0 (H) 11/12/2012   HGBA1C 7.3 (H) 05/13/2012   HGBA1C 6.8 (H) 08/21/2011   HGBA1C 6.5 11/15/2010   HGBA1C 7.3 (H) 07/04/2010   HGBA1C 8.1 (H) 02/17/2010   HGBA1C 7.3 (H) 10/13/2009   HGBA1C 7.4 (H) 06/07/2009   HGBA1C 8.4 (H) 01/18/2009   HGBA1C 6.6 (H) 06/29/2008   HGBA1C 6.6 (H) 02/25/2008   HGBA1C 8.3 (H) 08/29/2006   She continues frequent steroid courses for MS.  She is on: - Metformin ER 1000 mg 2x a day - Victoza 1.8 mg daily in a.m. >> Ozempic 1 mg weekly - VGo 40 - 1 click for 68T >> 10 g carbs >> 5 clicks per L and 4-19 clicks per D >> 2-5 clicks before b'fast 5-6 clicks before lunch 6-22 clicks before  dinner She was previously on Bydureon. She was on Invokana 100 >> 300 >> had few yeast infections - stopped in 05/2013 and tried again in 2017 >> stopped again b/c yeast inf. She tried [place] but stopped because yeast infection 04/2018 We stopped Humulin 70/30 25 units bid. Januvia - did not help.  Glumetza not covered by insurance. She was previously on glipizide XL in the morning, stopped 11/2019.  She checks her sugars more than 4 times a day with her freestyle libre CGM:   Previously:   Lowest CBG: 21!! (at night) ...>> LO (night) >> 40 >> 54 >> 40s, 50s; she has hypoglycemia awareness in the 50s. Highest 287 >> 250 >> 250 >> >300 >> 300s >> 200s.  Pt's meals are: - Breakfast: yoghurt + granola, cereals  - Lunch: sandwich, sushi, [place]  - Dinner: home cooked meal: stews, etc. - Snacks: 3-4   -No CKD, last BUN/creatinine:  Lab Results  Component Value Date   BUN 13 07/22/2020   CREATININE 0.93 07/22/2020  She developed angioedema from ACE inhibitors in the past.  -+ HL; last set of lipids: Lab Results  Component Value Date   CHOL 106 07/22/2020   HDL 35.90 (L) 07/22/2020   LDLCALC 39 07/22/2020   LDLDIRECT 125.0 08/21/2017   TRIG 153.0 (H) 07/22/2020   CHOLHDL 3 07/22/2020  On  pravastatin added back in 08/2017 >> but feels hungry and "off" >> stopped several mo ago. Tried Zocor >> she did not like how she felt on it.  However, she now tolerates well Lipitor 40.  - last eye exam was on 09/28/2019: + DR  -+ Mild numbness and tingling in her feet.   She also has a history of MS (neuro Dr Arlice Colt) - last 2 atacks: 02/2012, 2010; also HTN, fatty liver, endometriosis. She had a kidney stone in 11/2016 (calcium oxalate)- had litotripsy. She had a total of 20-25 kidney stones throughout her life. 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  She sees cardiology. She decreased Amlodipine 2/2 itching, leg swelling. She was started  on a diuretic.  Beta-blocker has been increased summer 2022.  ROS: Constitutional: no weight gain/no weight loss, no fatigue, no subjective hyperthermia, no subjective hypothermia Eyes: no blurry vision, no xerophthalmia ENT: no sore throat, no nodules palpated in neck, no dysphagia, no odynophagia, no hoarseness Cardiovascular: no CP/no SOB/no palpitations/no leg swelling Respiratory: no cough/no SOB/no wheezing Gastrointestinal: no N/no V/no D/no C/no acid reflux Musculoskeletal: no muscle aches/no joint aches Skin: no rashes, no hair loss Neurological: no tremors/+ numbness/+ tingling/no dizziness  I reviewed pt's medications, allergies, PMH, social hx, family hx, and changes were documented in the history of present illness. Otherwise, unchanged from my initial visit note.  Past Medical History:  Diagnosis Date   Arthritis    mild   Depression    Diabetes mellitus    Type 2   Endometriosis    History of kidney stones    Hypertension    Multiple sclerosis ([place])    Type II or unspecified type diabetes mellitus with peripheral circulatory disorders, uncontrolled(250.72) 11/27/2012   Past Surgical History:  Procedure Laterality Date   EXPLORATORY LAPAROTOMY     endometriosis   EXTRACORPOREAL SHOCK WAVE LITHOTRIPSY Right 11/19/2016   Procedure: RIGHT EXTRACORPOREAL SHOCK WAVE LITHOTRIPSY (ESWL);  Surgeon: Festus Aloe, MD;  Location: [facility];  Service: Urology;  Laterality: Right;   LUMBAR DISC SURGERY     LS spine 2005 or so   WISDOM TOOTH EXTRACTION     Social History   Socioeconomic History   Marital status: Single    Spouse name: Not on file   Number of children: Not on file   Years of education: Not on file   Highest education level: Not on file  Occupational History   Not on file  Tobacco Use   Smoking status: Never   Smokeless tobacco: Never  Substance and Sexual Activity   Alcohol use: Yes    Alcohol/week: 0.0 standard drinks    Comment: occasional    Drug use: No   Sexual activity: Not on file  Other Topics Concern   Not on file  Social History Narrative   Single. Lives alone. No children. Pet cat.       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      Regular exercise: not lately   Caffeine use: daily; during to the week      Hobbies: travel, genealogy , read, paper craft   Social Determinants of Radio broadcast assistant Strain: Not on [employer] Insecurity: Not on file  Transportation Needs: Not on file  Physical Activity: Not on file  Stress: Not on file  Social Connections: Not on file  Intimate Partner Violence: Not on file   Current Outpatient Medications on File  Prior to Visit  Medication Sig Dispense Refill   amLODipine (NORVASC) 5 MG tablet TAKE 1 TABLET DAILY 180 tablet 3   atorvastatin (LIPITOR) 20 MG tablet Take 1 tablet (20 mg total) by mouth daily. 90 tablet 3   carvedilol (COREG) 12.5 MG tablet Take 1 tablet (12.5 mg total) by mouth 2 (two) times daily. 180 tablet 3   Continuous Blood Gluc Receiver (FREESTYLE LIBRE 14 DAY READER) DEVI 1 kit by Does not apply route See admin instructions. For continuous glucose monitoring 1 each 0   Continuous Blood Gluc Sensor (FREESTYLE LIBRE 14 DAY SENSOR) MISC CHANGE SENSOR EVERY 14 DAYS 6 each 3   DULoxetine (CYMBALTA) 60 MG capsule TAKE 1 CAPSULE DAILY 180 capsule 3   fluticasone (FLONASE) 50 MCG/ACT nasal spray Place 2 sprays into both nostrils as needed.      hydrochlorothiazide (MICROZIDE) 12.5 MG capsule TAKE 1 CAPSULE DAILY (NEED APPOINTMENT) 180 capsule 3   Insulin Disposable Pump (V-GO 40) KIT USE ONCE DAILY 90 kit 3   insulin lispro (HUMALOG) 100 UNIT/ML injection INJECT 76 UNITS UNDER THE SKIN ONCE AS DIRECTED WITH VGO 40 70 mL 3   Insulin Pen Needle 32G X 4 MM MISC Use 1x a day 100 each 3   levonorgestrel-ethinyl estradiol (LYBREL,AMETHYST) 90-20 MCG tablet Take 1 tablet by mouth daily.      Melatonin 5 MG CAPS Take by mouth.     metFORMIN (GLUCOPHAGE-XR) 500 MG 24  hr tablet TAKE 2 TABLETS (1,000 MG TOTAL) TWICE A DAY WITH A MEAL 360 tablet 3   modafinil (PROVIGIL) 200 MG tablet Take 1 tablet (200 mg total) by mouth daily as needed. 90 tablet 1   Naproxen Sodium 220 MG CAPS Take 440 mg by mouth 2 (two) times daily as needed (for pain.).      natalizumab (TYSABRI) 300 MG/15ML injection Inject 300 mg into the vein every 30 (thirty) days.     OZEMPIC, 1 MG/DOSE, 4 MG/3ML SOPN INJECT 1 MG UNDER THE SKIN ONCE A WEEK 9 mL 3   Probiotic Product (PROBIOTIC PO) Take by mouth.     No current facility-administered medications on file prior to visit.   Allergies  Allergen Reactions   Ace Inhibitors Swelling    Edema   Shingrix [Zoster Vac Recomb Adjuvanted]     ould not walk for 30 hours after first vaccine- recommended avoiding 2nd vaccine - had 2 falls during that time - possible GBS- discuss with Dr. Felecia Shelling - never had issues with flu shot -gradually improved    Family History  Problem Relation Age of Onset   Breast cancer Mother        early 31s   Diabetes Mother        maternal grandmother as well   Diabetes Father    Other Father        progressive supranuclear palsy    PE: BP (!) 148/100 (BP Location: Right Arm, Patient Position: Sitting, Cuff Size: Normal)   Pulse 87   Ht _0  (1.676 m)   Wt 232 lb 12.8 oz (105.6 kg)   SpO2 97%   BMI 37.57 kg/m  Body mass index is 37.57 kg/m. Wt Readings from Last 3 Encounters:  09/02/20 232 lb 12.8 oz (105.6 kg)  08/29/20 234 lb (106.1 kg)  07/22/20 232 lb (105.2 kg)   Constitutional: overweight, in NAD Eyes: PERRLA, EOMI, no exophthalmos ENT: moist mucous membranes, no thyromegaly, no cervical lymphadenopathy Cardiovascular: tachycardia, RR, No RG, +1/6 SEM  Respiratory: CTA B Gastrointestinal: abdomen soft, NT, ND, BS+ Musculoskeletal: no deformities, strength intact in all 4 Skin: moist, warm, no rashes Neurological: no tremor with outstretched hands, DTR normal in all 4  ASSESSMENT: 1.  DM2, insulin-dependent, uncontrolled, with complications: - DR  She saw the diabetes educator for a discussion about insulin pumps >> decided for a VGo  2. Obesity class 2 BMI Classification: < 18.5 underweight  18.5-24.9 normal weight  25.0-29.9 overweight  30.0-34.9 class I obesity  35.0-39.9 class II obesity  ? 40.0 class III obesity   3. HL  PLAN:  1. Patient with history of uncontrolled type 2 diabetes, exacerbated by steroid courses for MS and also extensive traveling for work in the past.  She is on a VGo 40 patch pump, weekly GLP-1 receptor agonist, and also metformin ER.  At last visit, sugars were much improved from before but they were still increasing slightly after breakfast and less so after lunch and dinner.  We discussed about trying to bolus slightly higher doses with meals and to try to bolus Humalog 15 minutes before each meal as she was bolusing at the start of the meal.  HbA1c at that time was better, at 6.9%. CGM interpretation: -At today's visit, we reviewed her CGM downloads: It appears that 83% of values are in target range (goal >70%), while 10% are higher than 180 (goal <25%), and 7% are lower than 70 (goal <4%).  The calculated average blood sugar is 125 in the last 2 weeks.  -Reviewing the CGM trends, sugars appear to be controlled during the night but with occasional lower values, under 70s between 3 AM and 7 AM also, during the day, she occasionally has a little lows, in the 50s.  For now, I suggested to change her VGo reservoir from 40 to 30 units, reducing her basal insulin.  Regarding her boluses, she occasionally under boluses especially for lunch but her sugars after dinner are much better compared to last visit, now with the vast majority lower than 180. For now, I suggested to continue the same Humalog doses before meals.  I did advise her that if the sugars start to increase after meals, to let me know so we can increase Ozempic to 2 mg weekly.  Another  option would be to switch to [facility] but I do not think she needs it for now. -  I advised her to:  Patient Instructions  Please continue: - Metformin ER 1000 mg 2x a day - Ozempic 1 mg weekly  Change: - VGo 30 with Humalog 15 min before meals: 2-5 clicks before b'fast 5-6 clicks before lunch 1-70 clicks before dinner  Please return in 4 months.  - we checked her HbA1c: 7.4% (higher) - advised to check sugars at different times of the day - 4x a day, rotating check times - advised for yearly eye exams >> she is UTD - return to clinic in 4 months  2. Obesity class 2 -She gained 3 pounds before last visit and lost ~6 pounds since last visit -We will continue the GLP-1 receptor agonist which should also help with weight loss.  We will also reduce her basal insulin, which will further help with weight loss  3. HL -Reviewed latest lipid panel from 07/2020: LDL excellent, HDL lower than target: Lab Results  Component Value Date   CHOL 106 07/22/2020   HDL 35.90 (L) 07/22/2020   LDLCALC 39 07/22/2020   LDLDIRECT 125.0 08/21/2017   TRIG 153.0 (H)  07/22/2020   CHOLHDL 3 07/22/2020  -She tried pravastatin and simvastatin but she did not like how this made her feel.  On Lipitor 20 mg daily, now tolerating well.  [name], MD PhD [facility]

## 2020-09-02 NOTE — Patient Instructions (Addendum)
Please continue: - Metformin ER 1000 mg 2x a day - Ozempic 1 mg weekly  Change: - VGo 30 with Humalog 15 min before meals: 2-5 clicks before b'fast 5-6 clicks before lunch [id number] clicks before dinner  Please return in 4 months.

## 2020-10-03 ENCOUNTER — Other Ambulatory Visit: Payer: Self-pay | Admitting: Family Medicine

## 2020-10-03 DIAGNOSIS — G35 Multiple sclerosis: Secondary | ICD-10-CM | POA: Diagnosis not present

## 2020-10-20 ENCOUNTER — Other Ambulatory Visit: Payer: Self-pay | Admitting: Family Medicine

## 2020-10-23 ENCOUNTER — Other Ambulatory Visit: Payer: Self-pay | Admitting: Internal Medicine

## 2020-10-24 NOTE — Progress Notes (Signed)
Phone [phone]   Subjective:  Patient presents today for their annual physical. Chief complaint-noted.   See problem oriented charting- ROS- full  review of systems was completed and negative except for: fatigue from MS, congestion from allergies along with PND, visual problems- posterior vitreous detachment, dust/mold allergy, weakness from MS  The following were reviewed and entered/updated in epic: Past Medical History:  Diagnosis Date   Arthritis    mild   Depression    Diabetes mellitus    Type 2   Endometriosis    History of kidney stones    Hypertension    Multiple sclerosis ([place])    Type II or unspecified type diabetes mellitus with peripheral circulatory disorders, uncontrolled(250.72) 11/27/2012   Patient Active Problem List   Diagnosis Date Noted   Aortic aneurysm ([place]) 08/21/2017    Priority: 1.   Pulmonary nodule 08/21/2017    Priority: 1.   Immunosuppressed status ([place]) 03/05/2017    Priority: 1.   Uncontrolled type 2 diabetes mellitus with peripheral circulatory disorder 11/27/2012    Priority: 1.   Relapsing remitting multiple sclerosis ([place]) 07/05/2006    Priority: 1.   Nephrolithiasis 08/21/2016    Priority: 2.   Depression 02/25/2008    Priority: 2.   Hyperlipidemia 08/29/2006    Priority: 2.   Essential hypertension 07/05/2006    Priority: 2.   Gait disturbance 04/17/2016    Priority: 3.   Tingling 04/17/2016    Priority: 3.   Snoring 04/17/2016    Priority: 3.   Urinary urgency 04/17/2016    Priority: 3.   Excessive sleepiness 04/17/2016    Priority: 3.   Allergic rhinitis 04/27/2014    Priority: 3.   Low back pain 04/27/2014    Priority: 3.   Endometriosis 07/05/2006    Priority: 3.   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/02/2019   High risk medication use 04/01/2019   Class II obesity 10/09/2016   Past Surgical History:   Procedure Laterality Date   EXPLORATORY LAPAROTOMY     endometriosis   EXTRACORPOREAL SHOCK WAVE LITHOTRIPSY Right 11/19/2016   Procedure: RIGHT EXTRACORPOREAL SHOCK WAVE LITHOTRIPSY (ESWL);  Surgeon: Festus Aloe, MD;  Location: [facility];  Service: Urology;  Laterality: Right;   LUMBAR DISC SURGERY     LS spine 2005 or so   WISDOM TOOTH EXTRACTION      Family History  Problem Relation Age of Onset   Breast cancer Mother        early 92s   Diabetes Mother        maternal grandmother as well   Diabetes Father    Other Father        progressive supranuclear palsy    Medications- reviewed and updated Current Outpatient Medications  Medication Sig Dispense Refill   amLODipine (NORVASC) 5 MG tablet TAKE 1 TABLET DAILY 180 tablet 3   atorvastatin (LIPITOR) 20 MG tablet TAKE 1 TABLET DAILY 90 tablet 3   carvedilol (COREG) 12.5 MG tablet Take 1 tablet (12.5 mg total) by mouth 2 (two) times daily. 180 tablet 3   Continuous Blood Gluc Receiver (FREESTYLE LIBRE 14 DAY READER) DEVI 1 kit by Does not apply route See admin instructions. For continuous glucose monitoring 1 each 0   Continuous Blood Gluc Sensor (FREESTYLE LIBRE 14 DAY SENSOR) MISC CHANGE SENSOR EVERY 14 DAYS 6 each 3   DULoxetine (CYMBALTA) 60 MG capsule TAKE  1 CAPSULE DAILY 180 capsule 3   fluticasone (FLONASE) 50 MCG/ACT nasal spray Place 2 sprays into both nostrils as needed.      HUMALOG 100 UNIT/ML injection INJECT 76 UNITS UNDER THE SKIN ONCE AS DIRECTED WITH VGO 40 70 mL 1   hydrochlorothiazide (MICROZIDE) 12.5 MG capsule TAKE 1 CAPSULE DAILY (NEED APPOINTMENT) 180 capsule 3   Insulin Disposable Pump (V-GO 30) KIT Use 1x a day 30 kit 11   insulin lispro (HUMALOG) 100 UNIT/ML injection INJECT 76 UNITS UNDER THE SKIN ONCE AS DIRECTED WITH VGO 40 70 mL 3   Insulin Pen Needle 32G X 4 MM MISC Use 1x a day 100 each 3   levonorgestrel-ethinyl estradiol (LYBREL,AMETHYST) 90-20 MCG tablet Take 1 tablet by mouth daily.       Melatonin 5 MG CAPS Take by mouth.     metFORMIN (GLUCOPHAGE-XR) 500 MG 24 hr tablet TAKE 2 TABLETS (1,000 MG TOTAL) TWICE A DAY WITH A MEAL 360 tablet 3   modafinil (PROVIGIL) 200 MG tablet Take 1 tablet (200 mg total) by mouth daily as needed. 90 tablet 1   Naproxen Sodium 220 MG CAPS Take 440 mg by mouth 2 (two) times daily as needed (for pain.).      natalizumab (TYSABRI) 300 MG/15ML injection Inject 300 mg into the vein every 30 (thirty) days.     OZEMPIC, 1 MG/DOSE, 4 MG/3ML SOPN INJECT 1 MG UNDER THE SKIN ONCE A WEEK 9 mL 3   Probiotic Product (PROBIOTIC PO) Take by mouth.     No current facility-administered medications for this visit.    Allergies-reviewed and updated Allergies  Allergen Reactions   Ace Inhibitors Swelling    ANGIOEDEMA   Shingrix [Zoster Vac Recomb Adjuvanted]     ould not walk for 30 hours after first vaccine- recommended avoiding 2nd vaccine - had 2 falls during that time - possible GBS- discuss with Dr. Felecia Shelling - never had issues with flu shot -gradually improved     Social History   Social History Narrative   Single. Lives alone. No children. Pet cat.       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 Oropharynx normal Neck: no thyromegaly CV: RRR no murmurs rubs or gallops Lungs: CTAB no crackles, wheeze, rhonchi Abdomen: soft/nontender/nondistended/normal bowel sounds. No rebound or guarding.  Ext: trace edema Skin: warm, dry Neuro: grossly normal, moves all extremities, PERRLA  Diabetic Foot Exam - Simple   Simple Foot Form Diabetic Foot exam was performed with the following findings: Yes  10/31/2020  4:49 PM  Visual Inspection No deformities, no ulcerations, no other skin breakdown bilaterally: Yes Sensation Testing Intact to touch and monofilament testing bilaterally: Yes Pulse Check Posterior Tibialis and Dorsalis pulse intact bilaterally: Yes Comments       Assessment and Plan   50 y.o. female presenting for annual physical.  Health Maintenance counseling: 1. Anticipatory guidance: Patient counseled regarding regular dental exams -q6 months, eye exams - regularly- following closely on pull from retina- not true detachment  with 2-3 week follow up,  avoiding smoking and second hand smoke  , limiting alcohol to 1 beverage per day .   2. Risk factor reduction:  Advised patient of need for regular exercise and diet rich and fruits and vegetables to reduce risk of heart attack and stroke. Exercise- not great lately with work- hoping load lightens and space opens up. Diet-stable weight- has times where eats healthy and then has other times where does not eat as healthy - meals tend to be healthy, snacks tend to be unhealthy- not open at moment to change until things lighten at work- needs an alternate long run stress reducer.  Wt Readings from Last 3 Encounters:  10/31/20 232 lb (105.2 kg)  09/02/20 232 lb 12.8 oz (105.6 kg)  08/29/20 234 lb (106.1 kg)  3. Immunizations/screenings/ancillary studies DISCUSSED:  -Flu vac (last one 09/21) - had this today -COVID booster bivalent-  recommended at pharmacy- wants to do after does shingrix -Holding off on Shingrix as could not walk for 30 hours after vaccination- some concern for possible GBS- talked with  neurology and they are ok with her taking this again but she needs it to be a Friday due to potential debility-  Immunization History  Administered Date(s) Administered   Influenza Whole 10/14/2008   Influenza,inj,Quad PF,6+ Mos 11/02/2013, 10/09/2016, 10/07/2017, 10/31/2020   Influenza-Unspecified 10/05/2019   PFIZER(Purple  Top)SARS-COV-2 Vaccination 04/10/2019, 05/01/2019, 10/02/2019, 06/17/2020   Pneumococcal Polysaccharide-23 01/16/2007   Tdap 01/16/2007, 08/21/2017   Zoster, Unspecified 06/17/2020   4. Cervical cancer screening- pap smear 10/31/2017-[facility] 5. Breast cancer screening-  breast exam with GYN and mammogram 12/27/2017 -try to get records again today 6. Colon cancer screening -colonoscopy 03/08/08 with 10 year repeat planned - patient was referred but was told no blocks of time available to schedule- she is going to call back- I apologized for inconvenience 7. Skin cancer screening-does not see dermatology. advised regular sunscreen use. Denies worrisome, changing, or new skin lesions. Back exam today planned - mainly cherry angiomas and seborrheic keratosis noted-no concerning lesions 8. Birth control/STD check- remains on birth control-not sure where she is in the menopausal process- either peri or post is believed by GYN  9. Osteoporosis screening at 50- plans for this with GYN -Never smoker  Status of chronic or acute concerns   #Social update-last visit was down to staff members on team for and had incredible stress at work. Still basically doing 3 people's jobs- finally starting to shift some of her work to someone else so hopefully down to 2 peoples work by next visit  # aortic aneurysm #Pulmonary nodule - seen vascular surgery in the past  -Most recent CT angiogram 12/02/2019 and was under 4 cm-prior imaging apparently overestimated-no further repeat imaging was recommended -Also had a stable pulmonary nodule 6 mm with plan for 1 final follow-up 18 to 24 months out from May 2020 visit  and was told was stable on echocardiogram. From Dr. Lucianne Lei tright's note "Stable 4.0 cm fusiform ascending aneurysm Hypertension, fairly well controlled plan: Repeat CTA of thoracic aorta in 18 months.  She understood the goal is to keep her blood pressure less  than 814 systolic." -Pulmonary nodule and aneurysm was largely stable on CT angiogram 12/02/2019 at 6 mm-they recommended 1 final follow-up 18 to 24 months out from -today we opted to do one final ct angio chest aorta to check on aneurysm as well as pulmonary nodules   #hypertension  S: medication: amlodipine 5 mg daily-has not tolerated 10 mg in the past, coreg 6.125 mg twice a day last visit was increased to 12.5 mg twice daily, hctz 12.5 mg daily -mom with low potassium issues Home readings #s: not recently checked but has cuff BP Readings from Last 3 Encounters:  10/31/20 (!) 154/96  09/02/20 (!) 148/100  08/29/20 (!) 156/92  A/P: Patient with elevation in office again today-I encouraged her to check blood pressure at home today/tomorrow-if blood pressure still high at home we discussed possibly adding hydrochlorothiazide another 12.5 mg to 25 mg-she will reach out to me in the next few days if she decides to make adjustment if we can also confirm blood pressure is high at home   #hyperlipidemia S: Medication:atorvastatin 55m daily Lab Results  Component Value Date   CHOL 106 07/22/2020   HDL 35.90 (L) 07/22/2020   LDLCALC 39 07/22/2020   LDLDIRECT 125.0 08/21/2017   TRIG 153.0 (H) 07/22/2020   CHOLHDL 3 07/22/2020   A/P: Lipids were very well controlled within 4 months-continue current medication.    # Depression S: Medication:cymbalta 628mdaily -Was considering adding therapy at last visit. [facility] not [facility] 10/31/2020 07/22/2020 10/26/2019  Decreased Interest 0 0 0  Down, Depressed, Hopeless 0 2 0  PHQ - 2 Score 0 2 0  Altered sleeping 0 1 -  Tired, decreased energy 3 3 -  Change in appetite 1 0 -  Feeling bad or failure about yourself  0 1 -  Trouble concentrating 0 0 -  Moving slowly or fidgety/restless 0 0 -  Suicidal thoughts 0 0 -  PHQ-9 Score 4 7 -  Difficult doing work/chores Not difficult at all Somewhat difficult -  Some recent data  might be hidden  A/P: Full remission with PHQ-9 under 5-continue current medication. -work situation with more hope  - old boss moved on and that was helpful  #Diabetes  TYpe II S: a1c was well controlled with Dr. GhCruzita Ledereron metformin and insulin and ozempic once a week and CGM - considering increasing ozempic dose Lab Results  Component Value Date   HGBA1C 7.4 (A) 09/02/2020   HGBA1C 6.9 (A) 04/08/2020   HGBA1C 7.0 (A) 10/26/2019   A/P: Has been well controlled-mildly elevated last visit-continue close follow-up with Dr. GhLorelle Gibbsow continue current medication and hoping work situation improves but may consider increasing ozempic -Need copy of diabetic eye exam   #MS- on tysabri every 30 days-helpful- taken every 6 weeks due JCV levels- -provigil also per Dr. SaFelecia Shelling  #Nodule pulmonary-   Recommended follow up: Return in about 3 months (around 01/31/2021) for follow up- or sooner if needed. Future Appointments  Date Time Provider [place]/16/2022  3:20 PM GhPhilemon KingdomMD [facility] None  03/06/2021  1:00 PM Lomax, Amy, NP [facility] None   Lab/Order associations: Not fasting-labs not needed at this time    ICD-10-CM   1. Preventative health care  Z00.00     2. Hyperlipidemia, unspecified hyperlipidemia type  E78.5     3. Essential hypertension  I10     4. Major depressive disorder with single episode, in partial remission ([place] F32.4     5. Controlled diabetes mellitus with peripheral circulatory disorder (HCC)  E11.51 Microalbumin / creatinine urine ratio    6. Need for immunization against influenza  Z23 Flu Vaccine QUAD 4m45mo (Fluarix, Fluzone & Alfiuria Quad PF)    7. Aneurysm of ascending aorta  without rupture  I71.21 CT ANGIO CHEST AORTA W/CM & OR WO/CM     No orders of the defined types were placed in this encounter.  I,Jada Bradford,acting as a scribe for Garret Reddish, MD.,have documented all relevant documentation on the behalf of Garret Reddish, MD,as directed by  Garret Reddish, MD while in the presence of Garret Reddish, MD.   I, Garret Reddish, MD, have reviewed all documentation for this visit. The documentation on 10/31/20 for the exam, diagnosis, procedures, and orders are all accurate and complete.   Return precautions advised.  Garret Reddish, MD

## 2020-10-25 DIAGNOSIS — H43812 Vitreous degeneration, left eye: Secondary | ICD-10-CM | POA: Diagnosis not present

## 2020-10-31 ENCOUNTER — Other Ambulatory Visit: Payer: Self-pay

## 2020-10-31 ENCOUNTER — Ambulatory Visit (INDEPENDENT_AMBULATORY_CARE_PROVIDER_SITE_OTHER): Payer: BC Managed Care – PPO | Admitting: Family Medicine

## 2020-10-31 ENCOUNTER — Encounter: Payer: Self-pay | Admitting: Family Medicine

## 2020-10-31 VITALS — BP 154/96 | HR 91 | Temp 98.7°F | Ht 66.0 in | Wt 232.0 lb

## 2020-10-31 DIAGNOSIS — Z23 Encounter for immunization: Secondary | ICD-10-CM | POA: Diagnosis not present

## 2020-10-31 DIAGNOSIS — E785 Hyperlipidemia, unspecified: Secondary | ICD-10-CM | POA: Diagnosis not present

## 2020-10-31 DIAGNOSIS — Z0000 Encounter for general adult medical examination without abnormal findings: Secondary | ICD-10-CM

## 2020-10-31 DIAGNOSIS — I10 Essential (primary) hypertension: Secondary | ICD-10-CM

## 2020-10-31 DIAGNOSIS — F324 Major depressive disorder, single episode, in partial remission: Secondary | ICD-10-CM | POA: Diagnosis not present

## 2020-10-31 DIAGNOSIS — I7121 Aneurysm of the ascending aorta, without rupture: Secondary | ICD-10-CM

## 2020-10-31 DIAGNOSIS — E1151 Type 2 diabetes mellitus with diabetic peripheral angiopathy without gangrene: Secondary | ICD-10-CM

## 2020-10-31 NOTE — Patient Instructions (Addendum)
Health Maintenance Due  Topic Date Due   COLONOSCOPY   Please call  [facility] contact Address: [address], [place], [place] [address] Phone: [phone]   03/08/2018   MAMMOGRAM   -Team is calling for records.   12/28/2019   INFLUENZA VACCINE Regular dose shot done today.  08/15/2020   OPHTHALMOLOGY EXAM  - Has an appointment scheduled. Please let them send [place] the information after your appointment.  09/27/2020   COVID-19 Vaccine (5 - Booster for [employer] series)   - Please consider new bivalent shot at your local pharmacy after getting your shingles shot.  10/17/2020   PAP SMEAR-Modifier   -Team is calling for records.   10/31/2020   We will call you within two weeks about your referral to CT angiogram. If you do not hear within 2 weeks, give [place] a call.   Please consider increasing hydrochlorothiazide 25 mg and let me know in the next few days about your decision. I think this will be the first step! Also, please monitor blood pressure at home. The range should average <135/85.   [name] collect urine for microalbumin If you have mychart- we will send your results within 3 business days of [place] receiving them.  If you do not have mychart- we will call you about results within 5 business days of [place] receiving them.  *please also note that you will see labs on mychart as soon as they post. I will later go in and write notes on them- will say "notes from Dr. Yong Channel"  Recommended follow up: Return in about 3 months (around 01/31/2021) for follow up- or sooner if needed.

## 2020-11-01 LAB — MICROALBUMIN / CREATININE URINE RATIO
Creatinine,U: 218.2 mg/dL
Microalb Creat Ratio: 2 mg/g (ref 0.0–30.0)
Microalb, Ur: 4.3 mg/dL — ABNORMAL HIGH (ref 0.0–1.9)

## 2020-11-08 ENCOUNTER — Encounter: Payer: Self-pay | Admitting: Family Medicine

## 2020-11-08 DIAGNOSIS — H43812 Vitreous degeneration, left eye: Secondary | ICD-10-CM | POA: Diagnosis not present

## 2020-11-10 ENCOUNTER — Other Ambulatory Visit: Payer: Self-pay | Admitting: Family Medicine

## 2020-11-10 MED ORDER — HYDROCHLOROTHIAZIDE 25 MG PO TABS: 25.0000 mg | ORAL_TABLET | Freq: Every day | ORAL | 3 refills | Status: DC

## 2020-11-10 NOTE — Progress Notes (Unsigned)
MyChart encounter.

## 2020-11-14 DIAGNOSIS — G35 Multiple sclerosis: Secondary | ICD-10-CM | POA: Diagnosis not present

## 2020-11-22 ENCOUNTER — Other Ambulatory Visit: Payer: Self-pay | Admitting: Internal Medicine

## 2020-11-30 ENCOUNTER — Other Ambulatory Visit: Payer: Self-pay | Admitting: Internal Medicine

## 2020-12-13 ENCOUNTER — Encounter: Payer: Self-pay | Admitting: Gastroenterology

## 2020-12-20 ENCOUNTER — Telehealth: Payer: Self-pay

## 2020-12-20 DIAGNOSIS — E785 Hyperlipidemia, unspecified: Secondary | ICD-10-CM

## 2020-12-20 NOTE — Telephone Encounter (Signed)
(  Dr. Cruzita Lederer pt).   Patient has requested a 3 month supply of V-go. Because she has the pay the same for a 3 mth supply, and the same for a one month supply.  If possible please send to [facility].   Call back ph [phone]

## 2020-12-21 MED ORDER — V-GO 40 KIT: PACK | 1 refills | Status: DC

## 2020-12-21 NOTE — Telephone Encounter (Signed)
New rx sent to preferred pharmacy. °

## 2020-12-21 NOTE — Addendum Note (Signed)
Addended by: Lauralyn Primes on: 12/21/2020 08:45 AM   Modules accepted: Orders

## 2020-12-26 DIAGNOSIS — G35 Multiple sclerosis: Secondary | ICD-10-CM | POA: Diagnosis not present

## 2020-12-29 ENCOUNTER — Ambulatory Visit
Admission: RE | Admit: 2020-12-29 | Discharge: 2020-12-29 | Disposition: A | Discharge status: Home / Self Care | Payer: BC Managed Care – PPO | Source: Ambulatory Visit | Attending: Neurology | Admitting: Neurology

## 2020-12-29 ENCOUNTER — Other Ambulatory Visit: Payer: Self-pay

## 2020-12-29 DIAGNOSIS — R269 Unspecified abnormalities of gait and mobility: Secondary | ICD-10-CM

## 2020-12-29 DIAGNOSIS — G35 Multiple sclerosis: Secondary | ICD-10-CM | POA: Diagnosis not present

## 2020-12-30 ENCOUNTER — Ambulatory Visit: Payer: BC Managed Care – PPO | Admitting: Internal Medicine

## 2021-01-04 ENCOUNTER — Ambulatory Visit
Admission: RE | Admit: 2021-01-04 | Discharge: 2021-01-04 | Disposition: A | Discharge status: Home / Self Care | Payer: BC Managed Care – PPO | Source: Ambulatory Visit | Attending: Family Medicine | Admitting: Family Medicine

## 2021-01-04 DIAGNOSIS — I7121 Aneurysm of the ascending aorta, without rupture: Secondary | ICD-10-CM

## 2021-01-04 DIAGNOSIS — R911 Solitary pulmonary nodule: Secondary | ICD-10-CM | POA: Diagnosis not present

## 2021-01-04 MED ORDER — IOPAMIDOL (ISOVUE-370) INJECTION 76%
75.0000 mL | Freq: Once | INTRAVENOUS | Status: AC | PRN
  Administered 2021-01-04: 09:00:00 75 mL via INTRAVENOUS

## 2021-01-10 ENCOUNTER — Other Ambulatory Visit: Payer: Self-pay | Admitting: Family Medicine

## 2021-01-12 ENCOUNTER — Telehealth: Payer: Self-pay | Admitting: *Deleted

## 2021-01-12 NOTE — Telephone Encounter (Signed)
Noted- placed in PV instructions- Thanks Lelan Pons PV

## 2021-01-12 NOTE — Telephone Encounter (Signed)
Pt has an insulin pump- please obtain pump hold instructions for her colon 03-08-2021 Her MD is [name]  Thanks Lelan Pons PV

## 2021-01-12 NOTE — Telephone Encounter (Signed)
Kristin Mathews [birth year] [id number]   Dear Dr. Dr Cruzita Lederer:    Dr. Candis Schatz has scheduled the above patient for a colonoscopy at [facility] on 03/08/21.  Our records show that she is on insulin therapy via an insulin pump.  Our colonoscopy prep protocol requires that:  the patient must be on a clear liquid diet the entire day prior to the procedure date as well as the morning of the procedure  the patient must be NPO for 3 to 4 hours prior to the procedure   the patient must consume a PEG 3350 solution to prepare for the procedure.  Please advise [place] of any adjustments that need to be made to the patients insulin pump therapy prior to the above procedure date.    Please route your response to [phone] .  If you have any questions, please call me at [phone].  Thank you for your help with this matter.  Sincerely,  Dixon Boos, [place]

## 2021-01-25 ENCOUNTER — Ambulatory Visit (AMBULATORY_SURGERY_CENTER): Payer: BC Managed Care – PPO | Admitting: *Deleted

## 2021-01-25 VITALS — Ht 66.0 in | Wt 232.0 lb

## 2021-01-25 DIAGNOSIS — Z1211 Encounter for screening for malignant neoplasm of colon: Secondary | ICD-10-CM

## 2021-01-25 MED ORDER — NA SULFATE-K SULFATE-MG SULF 17.5-3.13-1.6 GM/177ML PO SOLN: 2.0000 | Freq: Once | ORAL | 0 refills | Status: AC

## 2021-01-25 NOTE — Progress Notes (Signed)
No egg or soy allergy known to patient  No issues known to pt with past sedation with any surgeries or procedures Patient denies ever being told they had issues or difficulty with intubation  No FH of Malignant Hyperthermia Pt is not on diet pills Pt is not on  home 02  Pt is not on blood thinners  Pt denies issues with constipation  No A fib or A flutter  Pt is fully vaccinated  for Covid   and  NO PA's for preps discussed with pt In PV today  Discussed with pt there will be an out-of-pocket cost for prep and that varies from $0 to 70 +  dollars - pt verbalized understanding   Due to the COVID-19 pandemic we are asking patients to follow certain guidelines in PV and the [place]   Pt aware of COVID protocols and [facility] guidelines   PV completed over the phone. Pt verified name, DOB, address and insurance during PV today.   Pt encouraged to call with questions or issues.  If pt has My chart, procedure instructions sent via My Chart

## 2021-02-02 ENCOUNTER — Other Ambulatory Visit (INDEPENDENT_AMBULATORY_CARE_PROVIDER_SITE_OTHER): Payer: Self-pay

## 2021-02-02 ENCOUNTER — Other Ambulatory Visit: Payer: Self-pay | Admitting: *Deleted

## 2021-02-02 DIAGNOSIS — G35 Multiple sclerosis: Secondary | ICD-10-CM | POA: Diagnosis not present

## 2021-02-02 DIAGNOSIS — Z79899 Other long term (current) drug therapy: Secondary | ICD-10-CM | POA: Diagnosis not present

## 2021-02-02 DIAGNOSIS — Z0289 Encounter for other administrative examinations: Secondary | ICD-10-CM

## 2021-02-08 ENCOUNTER — Encounter: Payer: BC Managed Care – PPO | Admitting: Gastroenterology

## 2021-02-15 LAB — CBC WITH DIFFERENTIAL/PLATELET
Basophils Absolute: 0.1 10*3/uL (ref 0.0–0.2)
Basos: 1 %
EOS (ABSOLUTE): 0.7 10*3/uL — ABNORMAL HIGH (ref 0.0–0.4)
Eos: 5 %
Hematocrit: 41.2 % (ref 34.0–46.6)
Hemoglobin: 13.5 g/dL (ref 11.1–15.9)
Immature Grans (Abs): 0.1 10*3/uL (ref 0.0–0.1)
Immature Granulocytes: 1 %
Lymphocytes Absolute: 5.3 10*3/uL — ABNORMAL HIGH (ref 0.7–3.1)
Lymphs: 40 %
MCH: 28.2 pg (ref 26.6–33.0)
MCHC: 32.8 g/dL (ref 31.5–35.7)
MCV: 86 fL (ref 79–97)
Monocytes Absolute: 0.7 10*3/uL (ref 0.1–0.9)
Monocytes: 5 %
Neutrophils Absolute: 6.3 10*3/uL (ref 1.4–7.0)
Neutrophils: 48 %
Platelets: 394 10*3/uL (ref 150–450)
RBC: 4.79 x10E6/uL (ref 3.77–5.28)
RDW: 13.7 % (ref 11.7–15.4)
WBC: 13.1 10*3/uL — ABNORMAL HIGH (ref 3.4–10.8)

## 2021-02-15 LAB — STRATIFY JCV(TM) AB W/INDEX
JCV Antibody by Inhibition: NEGATIVE
JCV Antibody: UNDETERMINED
JCV Index Value: 0.23

## 2021-03-02 LAB — HM DIABETES EYE EXAM

## 2021-03-03 ENCOUNTER — Encounter: Payer: Self-pay | Admitting: Internal Medicine

## 2021-03-06 ENCOUNTER — Other Ambulatory Visit: Payer: Self-pay

## 2021-03-06 ENCOUNTER — Ambulatory Visit: Payer: BC Managed Care – PPO | Admitting: Family Medicine

## 2021-03-06 ENCOUNTER — Encounter: Payer: Self-pay | Admitting: Gastroenterology

## 2021-03-06 ENCOUNTER — Encounter: Payer: Self-pay | Admitting: Family Medicine

## 2021-03-06 VITALS — BP 142/84 | HR 88 | Ht 66.0 in | Wt 235.6 lb

## 2021-03-06 DIAGNOSIS — G4710 Hypersomnia, unspecified: Secondary | ICD-10-CM | POA: Diagnosis not present

## 2021-03-06 DIAGNOSIS — R269 Unspecified abnormalities of gait and mobility: Secondary | ICD-10-CM | POA: Diagnosis not present

## 2021-03-06 DIAGNOSIS — Z79899 Other long term (current) drug therapy: Secondary | ICD-10-CM | POA: Diagnosis not present

## 2021-03-06 DIAGNOSIS — G35 Multiple sclerosis: Secondary | ICD-10-CM

## 2021-03-06 MED ORDER — MODAFINIL 200 MG PO TABS: 200.0000 mg | ORAL_TABLET | Freq: Every day | ORAL | 1 refills | Status: DC | PRN

## 2021-03-06 NOTE — Progress Notes (Signed)
PATIENT: Kristin Mathews DOB: 1970-12-27  REASON FOR VISIT: follow up HISTORY FROM: patient  Chief Complaint  Patient presents with   Follow-up    [name], alone. Here for 6 month MS f/u, on Tysabri. Last infusion: 02/02/2021 and Next infusion : 03/13/2021. Pt reports doing well, no new or worsening in sx.      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 She continues q6wk infusions due to low positive JCV in 05/2019. Most recent JCV negative (0.23) on 02/02/2021. CBC stable. Last MRI stable in 12/2020.   She is doing well, today. No new or worsening symptoms. She continues to work full time. She is sleeping fairly well. She is taking melatonin that helps. She also takes modafinil as needed for EDS. She feels it helps. She does not usually take on weekends.   She admits that CBG's have been a little higher than normal. Last A1C was 7.4.  She also notes BP has been higher than normal. She is followed closely by cardiology for aortic aneurysm. She has follow up scheduled this week.   08/29/2020 RS:  Kristin Mathews is a 51 y.o. woman with relapsing remitting MS dx 2001.   She continues on Tysabri for RRMS .  She tolerates it well and has not had any exacerbations.  She was JCV antibody negative when last checked 07/29/2020 and was negativeat 0.25. Marland Kitchen   Her last brain MRI 01/20/2018 showed an overall small plaque burden.  We have no recent MRI of the cervical spine.   Gait is ok on a flat surface but balance is off and she uses the banister to go up and down stairs. She has had a couple falls either because of reduced balance or foot drop.  Strength and sensation is fine imost of the time in arms and legs.  She denies any difficulty with her bladder.  Vision function is ok but she has had diabetic retinopathy that is being followed.     She notes more fatigue and a couple days needed to take a nap.    She is sleeping well most nights (6-8 hours during the  work week and 12-15 hours on weekends).   She has had a PSG in the past and she had no OSA.     She denies any difficulties with cognition.  Her mood is fine.   She had her first Singrix vaccination in June concurrent with her covid booster.   She felt weaker x 48 hours and had bladder control issues.      MS History: She presented with unilateral numbness in May 2001 and was diagnosed with transverse myelitis.    MRI only showed one spot and an LP was non-diagnostic.   She had another episode of numbness 6 months later and MRI showed several newer lesions.    She was placed on REbif as part of the Rebiject study.     She had an exacerbation in 2007 and in 2010 with numbness and then optic neuritis.   In 2015, she had another numbness exacerbation and we decided to switch to Tysabri.    She tolerates Tysabri well.     Imaging: MRI of the brain 01/20/2018 showed T2/flair hyperintense foci in the periventricular, juxtacortical and deep white matter.  These are consistent with chronic demyelinating plaque associated with multiple sclerosis though some of the foci are nonspecific.  REVIEW OF SYSTEMS: Out of a complete 14  system review of symptoms, the patient complains only of the following symptoms, chronic allergies, chronic fatigue, generalized weakness and all other reviewed systems are negative.   ALLERGIES: Allergies  Allergen Reactions   Ace Inhibitors Swelling    ANGIOEDEMA   Shingrix [Zoster Vac Recomb Adjuvanted]     ould not walk for 30 hours after first vaccine- recommended avoiding 2nd vaccine - had 2 falls during that time - possible GBS- discuss with Dr. Felecia Shelling - never had issues with flu shot -gradually improved     HOME MEDICATIONS: Outpatient Medications Prior to Visit  Medication Sig Dispense Refill   amLODipine (NORVASC) 5 MG tablet TAKE 1 TABLET DAILY 180 tablet 3   atorvastatin (LIPITOR) 20 MG tablet TAKE 1 TABLET DAILY 90 tablet 3   azelastine (ASTELIN) 0.1 % nasal  spray Place into both nostrils 2 (two) times daily. Use in each nostril as directed     carvedilol (COREG) 12.5 MG tablet Take 1 tablet (12.5 mg total) by mouth 2 (two) times daily. 180 tablet 3   Continuous Blood Gluc Receiver (FREESTYLE LIBRE 14 DAY READER) DEVI 1 kit by Does not apply route See admin instructions. For continuous glucose monitoring 1 each 0   Continuous Blood Gluc Sensor (FREESTYLE LIBRE 14 DAY SENSOR) MISC CHANGE SENSOR EVERY 14 DAYS 6 each 3   DULoxetine (CYMBALTA) 60 MG capsule TAKE 1 CAPSULE DAILY (NEED APPOINTMENT) 180 capsule 0   fluticasone (FLONASE) 50 MCG/ACT nasal spray Place 2 sprays into both nostrils as needed.     HUMALOG 100 UNIT/ML injection INJECT 76 UNITS UNDER THE SKIN ONCE AS DIRECTED WITH VGO 40 70 mL 1   hydrochlorothiazide (HYDRODIURIL) 25 MG tablet Take 1 tablet (25 mg total) by mouth daily. 90 tablet 3   Insulin Disposable Pump (V-GO 40) KIT USE ONCE DAILY 90 kit 1   insulin lispro (HUMALOG) 100 UNIT/ML injection INJECT 76 UNITS UNDER THE SKIN ONCE AS DIRECTED WITH VGO 40 70 mL 3   Insulin Pen Needle 32G X 4 MM MISC Use 1x a day 100 each 3   levocetirizine (XYZAL) 5 MG tablet Take 5 mg by mouth every evening.     levonorgestrel-ethinyl estradiol (LYBREL,AMETHYST) 90-20 MCG tablet Take 1 tablet by mouth daily.      Melatonin 5 MG CAPS Take by mouth.     metFORMIN (GLUCOPHAGE-XR) 500 MG 24 hr tablet TAKE 2 TABLETS (1,000 MG TOTAL) TWICE A DAY WITH A MEAL 360 tablet 3   Naproxen Sodium 220 MG CAPS Take 440 mg by mouth 2 (two) times daily as needed (for pain.).      natalizumab (TYSABRI) 300 MG/15ML injection Inject 300 mg into the vein every 30 (thirty) days.     OZEMPIC, 1 MG/DOSE, 4 MG/3ML SOPN INJECT 1 MG UNDER THE SKIN ONCE A WEEK 9 mL 3   Probiotic Product (PROBIOTIC PO) Take by mouth.     modafinil (PROVIGIL) 200 MG tablet Take 1 tablet (200 mg total) by mouth daily as needed. 90 tablet 1   No facility-administered medications prior to visit.     PAST MEDICAL HISTORY: Past Medical History:  Diagnosis Date   Arthritis    mild   Depression    Diabetes mellitus    Type 2   Endometriosis    GERD (gastroesophageal reflux disease)    Heart murmur    History of kidney stones    Hyperlipidemia    Hypertension    Multiple sclerosis ([place])    Type II  or unspecified type diabetes mellitus with peripheral circulatory disorders, uncontrolled(250.72) 11/27/2012    PAST SURGICAL HISTORY: Past Surgical History:  Procedure Laterality Date   EXPLORATORY LAPAROTOMY     endometriosis   EXTRACORPOREAL SHOCK WAVE LITHOTRIPSY Right 11/19/2016   Procedure: RIGHT EXTRACORPOREAL SHOCK WAVE LITHOTRIPSY (ESWL);  Surgeon: Festus Aloe, MD;  Location: [facility];  Service: Urology;  Laterality: Right;   LUMBAR DISC SURGERY     LS spine 2005 or so   WISDOM TOOTH EXTRACTION      FAMILY HISTORY: Family History  Problem Relation Age of Onset   Breast cancer Mother        early 64s   Diabetes Mother        maternal grandmother as well   Diabetes Father    Other Father        progressive supranuclear palsy   Colon cancer Neg Hx    Colon polyps Neg Hx    Esophageal cancer Neg Hx    Rectal cancer Neg Hx    Stomach cancer Neg Hx     SOCIAL HISTORY: Social History   Socioeconomic History   Marital status: Single    Spouse name: Not on file   Number of children: Not on file   Years of education: Not on file   Highest education level: Not on file  Occupational History   Not on file  Tobacco Use   Smoking status: Never   Smokeless tobacco: Never  Substance and Sexual Activity   Alcohol use: Yes    Alcohol/week: 0.0 standard drinks    Comment: occasional   Drug use: No   Sexual activity: Not on file  Other Topics Concern   Not on file  Social History Narrative   Single. Lives alone. No children. Pet cat.       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  Generalized: Well developed, in no acute distress  Cardiology: normal rate and rhythm, no murmur noted Respiratory: clear to auscultation bilaterally  Neurological examination  Mentation: Alert oriented to time, place, history taking. Follows all commands speech and language fluent Cranial nerve II-XII: Pupils were equal round reactive to light. Extraocular movements were full, visual field were full on confrontational test. Facial sensation and strength were normal. Uvula tongue midline. Head turning and shoulder shrug  were normal and symmetric. Motor: The motor testing reveals 5 over 5 strength of all 4 extremities. Good symmetric motor tone is noted throughout.  Sensory: Sensory testing is intact to soft touch on all 4 extremities. No evidence of extinction is noted.  Coordination: Cerebellar testing reveals good finger-nose-finger and heel-to-shin bilaterally.  Gait and station: Gait is normal.  Reflexes: Deep tendon reflexes are symmetric and normal bilaterally.   DIAGNOSTIC DATA (LABS, IMAGING, TESTING) - I reviewed patient  records, labs, notes, testing and imaging myself where available.  No flowsheet data found.   Lab Results  Component Value Date   WBC 13.1 (H) 02/02/2021   HGB 13.5 02/02/2021   HCT 41.2 02/02/2021   MCV 86 02/02/2021   PLT 394 02/02/2021      Component Value Date/Time   NA 139 07/22/2020 1347   NA 138 12/31/2017 1421   K 3.5 07/22/2020 1347   CL 101  07/22/2020 1347   CO2 29 07/22/2020 1347   GLUCOSE 209 (H) 07/22/2020 1347   BUN 13 07/22/2020 1347   BUN 14 12/31/2017 1421   CREATININE 0.93 07/22/2020 1347   CREATININE 0.73 04/30/2014 1109   CALCIUM 8.7 07/22/2020 1347   PROT 6.6 07/22/2020 1347   PROT 6.6 12/31/2017 1421   ALBUMIN 3.8 07/22/2020 1347   ALBUMIN 4.2 12/31/2017 1421   AST 19 07/22/2020 1347   ALT 24 07/22/2020 1347   ALKPHOS 72 07/22/2020 1347   BILITOT 0.3 07/22/2020 1347   BILITOT 0.2 12/31/2017 1421   GFRNONAA 78 12/31/2017 1421   GFRNONAA >89 04/30/2014 1109   GFRAA 90 12/31/2017 1421   GFRAA >89 04/30/2014 1109   Lab Results  Component Value Date   CHOL 106 07/22/2020   HDL 35.90 (L) 07/22/2020   LDLCALC 39 07/22/2020   LDLDIRECT 125.0 08/21/2017   TRIG 153.0 (H) 07/22/2020   CHOLHDL 3 07/22/2020   Lab Results  Component Value Date   HGBA1C 7.4 (A) 09/02/2020   No results found for: VITAMINB12 Lab Results  Component Value Date   TSH 2.97 04/22/2018     ASSESSMENT AND PLAN 51 y.o. year old female  has a past medical history of Arthritis, Depression, Diabetes mellitus, Endometriosis, GERD (gastroesophageal reflux disease), Heart murmur, History of kidney stones, Hyperlipidemia, Hypertension, Multiple sclerosis ([place]), and Type II or unspecified type diabetes mellitus with peripheral circulatory disorders, uncontrolled(250.72) (11/27/2012). here with     ICD-10-CM   1. Relapsing remitting multiple sclerosis ([place])  G35     2. High risk medication use  Z79.899     3. Gait disturbance  R26.9     4. Excessive sleepiness  G47.10       Ms. Aguillard continues to do well on Tysabri infusions. Will continue 6wk intervals. Last infusion 02/02/2021, next 03/13/2021. Most recent JCV negative (0.23). She will continue modafinil as prescribed. PDMP shows appropriate refills. Close follow up with PCP and cardiology advised. Healthy lifestyle habits encouraged. She will follow up in 6 months with Dr Felecia Shelling. She  verbalizes understanding and agreement with this plan.    No orders of the defined types were placed in this encounter.    Meds ordered this encounter  Medications   modafinil (PROVIGIL) 200 MG tablet    Sig: Take 1 tablet (200 mg total) by mouth daily as needed.    Dispense:  90 tablet    Refill:  1    Order Specific Question:   Supervising Provider    Answer:   Melvenia Beam [[id number]]      YTK PTWSF, FNP-C 03/06/2021, 1:58 PM [facility] [address], [place], [place] [address] [phone]

## 2021-03-06 NOTE — Patient Instructions (Signed)
Below is our plan:  We will continue current treatment plan. I have refilled your Provigil.   Please make sure you are staying well hydrated. I recommend 50-60 ounces daily. Well balanced diet and regular exercise encouraged. Consistent sleep schedule with 6-8 hours recommended.   Please continue follow up with care team as directed.   Follow up with Dr Felecia Shelling in 6 months   You may receive a survey regarding today's visit. I encourage you to leave honest feed back as I do use this information to improve patient care. Thank you for seeing me today!

## 2021-03-08 ENCOUNTER — Ambulatory Visit (AMBULATORY_SURGERY_CENTER): Payer: BC Managed Care – PPO | Admitting: Gastroenterology

## 2021-03-08 ENCOUNTER — Other Ambulatory Visit: Payer: Self-pay

## 2021-03-08 ENCOUNTER — Encounter: Payer: Self-pay | Admitting: Gastroenterology

## 2021-03-08 VITALS — BP 136/72 | HR 72 | Temp 97.3°F | Resp 22 | Ht 66.0 in | Wt 232.0 lb

## 2021-03-08 DIAGNOSIS — K621 Rectal polyp: Secondary | ICD-10-CM | POA: Diagnosis not present

## 2021-03-08 DIAGNOSIS — Z1211 Encounter for screening for malignant neoplasm of colon: Secondary | ICD-10-CM

## 2021-03-08 DIAGNOSIS — D128 Benign neoplasm of rectum: Secondary | ICD-10-CM | POA: Diagnosis not present

## 2021-03-08 MED ORDER — SODIUM CHLORIDE 0.9 % IV SOLN: 500.0000 mL | Freq: Once | INTRAVENOUS | Status: DC

## 2021-03-08 NOTE — Progress Notes (Signed)
[facility] History and Physical   Primary Care Physician:  Marin Olp, MD   Reason for Procedure:   Colon cancer screening  Plan:    Screening colonoscopy     HPI: Kristin Mathews is a 51 y.o. female undergoing average risk screening colonoscopy.  She had a colonoscopy 10 years ago to evaluate fecal urgency and incontinence which she says was unremarkable.  She continues to have problems with urgency and incontinence.  No abdominal pain or bloody stools.  No family history of colon cancer.    Past Medical History:  Diagnosis Date   Arthritis    mild   Depression    Diabetes mellitus    Type 2   Endometriosis    GERD (gastroesophageal reflux disease)    Heart murmur    History of kidney stones    Hyperlipidemia    Hypertension    Multiple sclerosis ([place])    Type II or unspecified type diabetes mellitus with peripheral circulatory disorders, uncontrolled(250.72) 11/27/2012    Past Surgical History:  Procedure Laterality Date   EXPLORATORY LAPAROTOMY     endometriosis   EXTRACORPOREAL SHOCK WAVE LITHOTRIPSY Right 11/19/2016   Procedure: RIGHT EXTRACORPOREAL SHOCK WAVE LITHOTRIPSY (ESWL);  Surgeon: Festus Aloe, MD;  Location: [facility];  Service: Urology;  Laterality: Right;   LUMBAR DISC SURGERY     LS spine 2005 or so   WISDOM TOOTH EXTRACTION      Prior to Admission medications   Medication Sig Start Date End Date Taking? Authorizing Provider  amLODipine (NORVASC) 5 MG tablet TAKE 1 TABLET DAILY 05/18/20  Yes Marin Olp, MD  atorvastatin (LIPITOR) 20 MG tablet TAKE 1 TABLET DAILY 10/03/20  Yes Marin Olp, MD  carvedilol (COREG) 12.5 MG tablet Take 1 tablet (12.5 mg total) by mouth 2 (two) times daily. 07/22/20  Yes Marin Olp, MD  Continuous Blood Gluc Receiver (FREESTYLE LIBRE 14 DAY READER) DEVI 1 kit by Does not apply route See admin instructions. For continuous glucose monitoring 03/20/19  Yes [name], MD  Continuous  Blood Gluc Sensor (FREESTYLE LIBRE 14 DAY SENSOR) MISC CHANGE SENSOR EVERY 14 DAYS 05/19/20  Yes [name], MD  DULoxetine (CYMBALTA) 60 MG capsule TAKE 1 CAPSULE DAILY (NEED APPOINTMENT) 01/11/21  Yes Marin Olp, MD  HUMALOG 100 UNIT/ML injection INJECT 60 UNITS UNDER THE SKIN ONCE AS DIRECTED WITH VGO 40 10/24/20  Yes [name], MD  hydrochlorothiazide (HYDRODIURIL) 25 MG tablet Take 1 tablet (25 mg total) by mouth daily. 11/10/20  Yes Marin Olp, MD  Insulin Disposable Pump (V-GO 40) KIT USE ONCE DAILY 12/21/20  Yes [name], MD  insulin lispro (HUMALOG) 100 UNIT/ML injection INJECT 76 UNITS UNDER THE SKIN ONCE AS DIRECTED WITH VGO 40 11/25/19  Yes [name], MD  Insulin Pen Needle 32G X 4 MM MISC Use 1x a day 03/30/19  Yes [name], MD  levocetirizine (XYZAL) 5 MG tablet Take 5 mg by mouth every evening.   Yes [provider]  levonorgestrel-ethinyl estradiol (LYBREL,AMETHYST) 90-20 MCG tablet Take 1 tablet by mouth daily.    Yes [provider]  Melatonin 5 MG CAPS Take by mouth.   Yes [provider]  metFORMIN (GLUCOPHAGE-XR) 500 MG 24 hr tablet TAKE 2 TABLETS (1,000 MG TOTAL) TWICE A DAY WITH A MEAL 11/22/20  Yes [name], MD  modafinil (PROVIGIL) 200 MG tablet Take 1 tablet (200 mg total) by mouth daily as needed. 03/06/21  Yes  Lomax, Amy, NP  Naproxen Sodium 220 MG CAPS Take 440 mg by mouth 2 (two) times daily as needed (for pain.).    Yes [provider]  OZEMPIC, 1 MG/DOSE, 4 MG/3ML SOPN INJECT 1 MG UNDER THE SKIN ONCE A WEEK 08/10/20  Yes [name], MD  Probiotic Product (PROBIOTIC PO) Take by mouth.   Yes [provider]  azelastine (ASTELIN) 0.1 % nasal spray Place into both nostrils 2 (two) times daily. Use in each nostril as directed    [provider]  fluticasone (FLONASE) 50 MCG/ACT nasal spray Place 2 sprays into both nostrils as needed.    [provider]  natalizumab (TYSABRI) 300 MG/15ML injection Inject 300 mg into the vein every 30 (thirty) days.    [provider]    Current Outpatient Medications  Medication Sig Dispense Refill   amLODipine (NORVASC) 5 MG tablet TAKE 1 TABLET DAILY 180 tablet 3   atorvastatin (LIPITOR) 20 MG tablet TAKE 1 TABLET DAILY 90 tablet 3   carvedilol (COREG) 12.5 MG tablet Take 1 tablet (12.5 mg total) by mouth 2 (two) times daily. 180 tablet 3   Continuous Blood Gluc Receiver (FREESTYLE LIBRE 14 DAY READER) DEVI 1 kit by Does not apply route See admin instructions. For continuous glucose monitoring 1 each 0   Continuous Blood Gluc Sensor (FREESTYLE LIBRE 14 DAY SENSOR) MISC CHANGE SENSOR EVERY 14 DAYS 6 each 3   DULoxetine (CYMBALTA) 60 MG capsule TAKE 1 CAPSULE DAILY (NEED APPOINTMENT) 180 capsule 0   HUMALOG 100 UNIT/ML injection INJECT 76 UNITS UNDER THE SKIN ONCE AS DIRECTED WITH VGO 40 70 mL 1   hydrochlorothiazide (HYDRODIURIL) 25 MG tablet Take 1 tablet (25 mg total) by mouth daily. 90 tablet 3   Insulin Disposable Pump (V-GO 40) KIT USE ONCE DAILY 90 kit 1   insulin lispro (HUMALOG) 100 UNIT/ML injection INJECT 76 UNITS UNDER THE SKIN ONCE AS DIRECTED WITH VGO 40 70 mL 3   Insulin Pen Needle 32G X 4 MM MISC Use 1x a day 100 each 3   levocetirizine (XYZAL) 5 MG tablet Take 5 mg by mouth every evening.     levonorgestrel-ethinyl estradiol (LYBREL,AMETHYST) 90-20 MCG tablet Take 1 tablet by mouth daily.      Melatonin 5 MG CAPS Take by mouth.     metFORMIN (GLUCOPHAGE-XR) 500 MG 24 hr tablet TAKE 2 TABLETS (1,000 MG TOTAL) TWICE A DAY WITH A MEAL 360 tablet 3   modafinil (PROVIGIL) 200 MG tablet Take 1 tablet (200 mg total) by mouth daily as needed. 90 tablet 1   Naproxen Sodium 220 MG CAPS Take 440 mg by mouth 2 (two) times daily as needed (for pain.).      OZEMPIC, 1 MG/DOSE, 4 MG/3ML SOPN INJECT 1 MG UNDER THE SKIN ONCE A WEEK 9 mL 3   Probiotic Product (PROBIOTIC PO) Take by mouth.      azelastine (ASTELIN) 0.1 % nasal spray Place into both nostrils 2 (two) times daily. Use in each nostril as directed     fluticasone (FLONASE) 50 MCG/ACT nasal spray Place 2 sprays into both nostrils as needed.     natalizumab (TYSABRI) 300 MG/15ML injection Inject 300 mg into the vein every 30 (thirty) days.     Current Facility-Administered Medications  Medication Dose Route Frequency Provider Last Rate Last Admin   0.9 %  sodium chloride infusion  500 mL Intravenous Once Daryel November, MD  Allergies as of 03/08/2021 - Review Complete 03/08/2021  Allergen Reaction Noted   Ace inhibitors Swelling 12/19/2012   Shingrix [zoster vac recomb adjuvanted]  07/22/2020    Family History  Problem Relation Age of Onset   Breast cancer Mother        early 66s   Diabetes Mother        maternal grandmother as well   Diabetes Father    Other Father        progressive supranuclear palsy   Colon cancer Neg Hx    Colon polyps Neg Hx    Esophageal cancer Neg Hx    Rectal cancer Neg Hx    Stomach cancer Neg Hx     Social History   Socioeconomic History   Marital status: Single    Spouse name: Not on file   Number of children: Not on file   Years of education: Not on file   Highest education level: Not on file  Occupational History   Not on file  Tobacco Use   Smoking status: Never   Smokeless tobacco: Never  Substance and Sexual Activity   Alcohol use: Yes    Alcohol/week: 0.0 standard drinks    Comment: occasional   Drug use: No   Sexual activity: Not on file  Other Topics Concern   Not on file  Social History Narrative   Single. Lives alone. No children. Pet cat.       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  Physical Exam: Vital signs BP 140/82    Pulse 81    Temp (!) 97.3 F (36.3 C)    Ht '5\' 6"'  (1.676 m)    Wt 232 lb (105.2 kg)    SpO2 96%    BMI 37.45 kg/m   General:   Alert,  Well-developed, well-nourished, pleasant and cooperative in NAD Airway:  Mallampati 2 Lungs:  Clear throughout to auscultation.   Heart:  Regular rate and rhythm; no murmurs, clicks, rubs,  or gallops. Abdomen:  Soft, nontender and nondistended. Normal bowel sounds.   Neuro/Psych:  Normal mood and affect. A and O x 3   Carmel Waddington E. Candis Schatz, MD [facility]

## 2021-03-08 NOTE — Op Note (Signed)
[facility] Patient Name: Kristin Mathews Procedure Date: 03/08/2021 11:20 AM MRN: [id number] Endoscopist: Nicki Reaper E. Candis Schatz , MD Age: 51 Referring MD:  Date of Birth: 1970/03/25 Gender: Female Account #: [id number] Procedure:                Colonoscopy Indications:              Screening for colorectal malignant neoplasm Medicines:                Monitored Anesthesia Care Procedure:                Pre-Anesthesia Assessment:                           - Prior to the procedure, a History and Physical                            was performed, and patient medications and                            allergies were reviewed. The patient's tolerance of                            previous anesthesia was also reviewed. The risks                            and benefits of the procedure and the sedation                            options and risks were discussed with the patient.                            All questions were answered, and informed consent                            was obtained. Prior Anticoagulants: The patient has                            taken no previous anticoagulant or antiplatelet                            agents. ASA Grade Assessment: III - A patient with                            severe systemic disease. After reviewing the risks                            and benefits, the patient was deemed in                            satisfactory condition to undergo the procedure.                           After obtaining informed consent, the colonoscope  was passed under direct vision. Throughout the                            procedure, the patient's blood pressure, pulse, and                            oxygen saturations were monitored continuously. The                            Olympus CF-HQ190L ([id number]) Colonoscope was                            introduced through the anus and advanced to the the                            terminal  ileum, with identification of the                            appendiceal orifice and IC valve. The colonoscopy                            was performed without difficulty. The colonoscopy                            was somewhat difficult due to significant looping.                            Successful completion of the procedure was aided by                            using manual pressure. The patient tolerated the                            procedure well. The quality of the bowel                            preparation was adequate. The terminal ileum,                            ileocecal valve, appendiceal orifice, and rectum                            were photographed. Scope In: 11:30:23 AM Scope Out: 11:50:50 AM Scope Withdrawal Time: 0 hours 12 minutes 10 seconds  Total Procedure Duration: 0 hours 20 minutes 27 seconds  Findings:                 Skin tags were found on perianal exam.                           The digital rectal exam was normal. Pertinent                            negatives include normal sphincter tone and no  palpable rectal lesions.                           A 4 mm polyp was found in the rectum. The polyp was                            sessile. The polyp was removed with a cold snare.                            Resection and retrieval were complete. Estimated                            blood loss was minimal.                           The exam was otherwise normal throughout the                            examined colon.                           The terminal ileum appeared normal.                           Anal papilla(e) were hypertrophied.                           No additional abnormalities were found on                            retroflexion. Complications:            No immediate complications. Estimated Blood Loss:     Estimated blood loss was minimal. Impression:               - Perianal skin tags found on perianal exam.                            - One 4 mm polyp in the rectum, removed with a cold                            snare. Resected and retrieved.                           - The examined portion of the ileum was normal.                           - Anal papilla(e) were hypertrophied. Recommendation:           - Patient has a contact number available for                            emergencies. The signs and symptoms of potential                            delayed complications were discussed with the  patient. Return to normal activities tomorrow.                            Written discharge instructions were provided to the                            patient.                           - Resume previous diet.                           - Continue present medications.                           - Await pathology results.                           - Repeat colonoscopy (date not yet determined) for                            surveillance based on pathology results. Annalese Stiner E. Candis Schatz, MD 03/08/2021 11:56:41 AM This report has been signed electronically.

## 2021-03-08 NOTE — Progress Notes (Signed)
VS DT  .r

## 2021-03-08 NOTE — Patient Instructions (Addendum)
Handout was given to your care partner on polyps. Your sugar was   145  in the recovery room. You may resume your current medications today. Await biopsy results.  May take 1-3 weeks to receive pathology results. Please call if any questions or concerns.      YOU HAD AN ENDOSCOPIC PROCEDURE TODAY AT [facility]:   Refer to the procedure report that was given to you for any specific questions about what was found during the examination.  If the procedure report does not answer your questions, please call your gastroenterologist to clarify.  If you requested that your care partner not be given the details of your procedure findings, then the procedure report has been included in a sealed envelope for you to review at your convenience later.  YOU SHOULD EXPECT: Some feelings of bloating in the abdomen. Passage of more gas than usual.  Walking can help get rid of the air that was put into your GI tract during the procedure and reduce the bloating. If you had a lower endoscopy (such as a colonoscopy or flexible sigmoidoscopy) you may notice spotting of blood in your stool or on the toilet paper. If you underwent a bowel prep for your procedure, you may not have a normal bowel movement for a few days.  Please Note:  You might notice some irritation and congestion in your nose or some drainage.  This is from the oxygen used during your procedure.  There is no need for concern and it should clear up in a day or so.  SYMPTOMS TO REPORT IMMEDIATELY:  Following lower endoscopy (colonoscopy or flexible sigmoidoscopy):  Excessive amounts of blood in the stool  Significant tenderness or worsening of abdominal pains  Swelling of the abdomen that is new, acute  Fever of 100F or higher     For urgent or emergent issues, a gastroenterologist can be reached at any hour by calling [phone]. Do not use MyChart messaging for urgent concerns.    DIET:  We do recommend a small meal at  first, but then you may proceed to your regular diet.  Drink plenty of fluids but you should avoid alcoholic beverages for 24 hours.  ACTIVITY:  You should plan to take it easy for the rest of today and you should NOT DRIVE or use heavy machinery until tomorrow (because of the sedation medicines used during the test).    FOLLOW UP: Our staff will call the number listed on your records 48-72 hours following your procedure to check on you and address any questions or concerns that you may have regarding the information given to you following your procedure. If we do not reach you, we will leave a message.  We will attempt to reach you two times.  During this call, we will ask if you have developed any symptoms of COVID 19. If you develop any symptoms (ie: fever, flu-like symptoms, shortness of breath, cough etc.) before then, please call [phone].  If you test positive for Covid 19 in the 2 weeks post procedure, please call and report this information to [place].    If any biopsies were taken you will be contacted by phone or by letter within the next 1-3 weeks.  Please call us at [phone] if you have not heard about the biopsies in 3 weeks.    SIGNATURES/CONFIDENTIALITY: You and/or your care partner have signed paperwork which will be entered into your electronic medical record.  These signatures attest to  the fact that that the information above on your After Visit Summary has been reviewed and is understood.  Full responsibility of the confidentiality of this discharge information lies with you and/or your care-partner.

## 2021-03-08 NOTE — Progress Notes (Signed)
PT taken to PACU. Monitors in place. VSS. Report given to RN. 

## 2021-03-08 NOTE — Progress Notes (Signed)
Called to room to assist during endoscopic procedure.  Patient ID and intended procedure confirmed with present staff. Received instructions for my participation in the procedure from the performing physician.  

## 2021-03-09 ENCOUNTER — Encounter: Payer: Self-pay | Admitting: Internal Medicine

## 2021-03-09 ENCOUNTER — Ambulatory Visit: Payer: BC Managed Care – PPO | Admitting: Internal Medicine

## 2021-03-09 VITALS — BP 140/88 | HR 96 | Ht 66.0 in | Wt 233.0 lb

## 2021-03-09 DIAGNOSIS — E669 Obesity, unspecified: Secondary | ICD-10-CM

## 2021-03-09 DIAGNOSIS — E1165 Type 2 diabetes mellitus with hyperglycemia: Secondary | ICD-10-CM

## 2021-03-09 DIAGNOSIS — E785 Hyperlipidemia, unspecified: Secondary | ICD-10-CM

## 2021-03-09 DIAGNOSIS — E1159 Type 2 diabetes mellitus with other circulatory complications: Secondary | ICD-10-CM | POA: Diagnosis not present

## 2021-03-09 MED ORDER — V-GO 30 KIT
PACK | 3 refills | Status: DC
Start: 2021-03-09 — End: 2022-04-23

## 2021-03-09 MED ORDER — SEMAGLUTIDE (2 MG/DOSE) 8 MG/3ML ~~LOC~~ SOPN: 2.0000 mg | PEN_INJECTOR | SUBCUTANEOUS | 3 refills | Status: DC

## 2021-03-09 NOTE — Progress Notes (Signed)
Patient ID: Kristin RENOVATO, female   DOB: [birth year], 51 y.o.   MRN: [id number]  This visit occurred during the SARS-CoV-2 public health emergency.  Safety protocols were in place, including screening questions prior to the visit, additional usage of staff PPE, and extensive cleaning of exam room while observing appropriate contact time as indicated for disinfecting solutions.   HPI: Kristin Mathews is a 51 y.o.-year-old female, presenting for f/u for DM2, dx 2009, insulin-dependent since 2009, uncontrolled, with complications (diabetic retinopathy). Last visit 6 months ago.  Interim history: She traveled to [place] last month for 8 days. Sugars were occasionally low as she was more active. No increased urination, blurry vision, nausea, chest pain.  Reviewed HbA1c levels: Lab Results  Component Value Date   HGBA1C 7.4 (A) 09/02/2020   HGBA1C 6.9 (A) 04/08/2020   HGBA1C 7.0 (A) 10/26/2019   HGBA1C 7.4 (A) 06/29/2019   HGBA1C 8.7 (A) 03/30/2019   HGBA1C 8.4 (A) 04/22/2018   HGBA1C 7.9 (A) 10/07/2017   HGBA1C 9 01/31/2017   HGBA1C 8.3 (H) 08/21/2016   HGBA1C 7.6 01/23/2016   HGBA1C 9.0 09/26/2015   HGBA1C 8.5 (H) 02/22/2015   HGBA1C 8.2 08/03/2014   HGBA1C 8.6 (H) 04/30/2014   HGBA1C 9.3 (H) 11/02/2013   HGBA1C 7.1 (H) 04/14/2013   HGBA1C 8.0 (H) 11/12/2012   HGBA1C 7.3 (H) 05/13/2012   HGBA1C 6.8 (H) 08/21/2011   HGBA1C 6.5 11/15/2010   HGBA1C 7.3 (H) 07/04/2010   HGBA1C 8.1 (H) 02/17/2010   HGBA1C 7.3 (H) 10/13/2009   HGBA1C 7.4 (H) 06/07/2009   HGBA1C 8.4 (H) 01/18/2009   HGBA1C 6.6 (H) 06/29/2008   HGBA1C 6.6 (H) 02/25/2008   HGBA1C 8.3 (H) 08/29/2006   She continues frequent steroid courses for MS.  She is on: - Metformin ER 1000 mg 2x a day - Victoza 1.8 mg daily in a.m. >> Ozempic 1 mg weekly - VGo 40 - 1 click for 38V >> 10 g carbs >> 5 clicks per L and 5-64 clicks per D >> PPI95: 2-5 clicks before b'fast 5-6 clicks before lunch 1-88 clicks before dinner She was  previously on Bydureon. She was on Invokana 100 >> 300 >> had few yeast infections - stopped in 05/2013 and tried again in 2017 >> stopped again b/c yeast inf. She tried [place] but stopped because yeast infection 04/2018 We stopped Humulin 70/30 25 units bid. Januvia - did not help.  Glumetza not covered by insurance. She was previously on glipizide XL in the morning, stopped 11/2019.  She checks her sugars more than 4 times a day with her freestyle libre CGM:     Previously   Lowest CBG: 21!! (at night) ...>> LO (night) >>... 40s, 50s >> LO; she has hypoglycemia awareness in the 65s. Highest >300 >> 300s >> 200s >> 200s.  Pt's meals are: - Breakfast: yoghurt + granola, cereals  - Lunch: sandwich, sushi, [place]  - Dinner: home cooked meal: stews, etc. - Snacks: 3-4   -No CKD, last BUN/creatinine:  Lab Results  Component Value Date   BUN 13 07/22/2020   CREATININE 0.93 07/22/2020  She developed angioedema from ACE inhibitors in the past.  -+ HL; last set of lipids: Lab Results  Component Value Date   CHOL 106 07/22/2020   HDL 35.90 (L) 07/22/2020   LDLCALC 39 07/22/2020   LDLDIRECT 125.0 08/21/2017   TRIG 153.0 (H) 07/22/2020   CHOLHDL 3 07/22/2020  On pravastatin added back in 08/2017 >> but feels hungry  and "off" >> stopped.  Tried Zocor >> she did not like how she felt on it.   However, she now tolerates well Lipitor 20 (decreased from 40 mg).  - last eye exam was on 03/02/2021: + DR reportedly  -+ Mild numbness and tingling in her feet.   She also has a history of MS (neuro Dr Arlice Colt) - last 2 atacks: 02/2012, 2010; also HTN, fatty liver, endometriosis. She had a kidney stone in 11/2016 (calcium oxalate)- had litotripsy. She had a total of 20-25 kidney stones throughout her life. 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  She sees cardiology. She decreased Amlodipine 2/2 itching, leg swelling. She was started on a  diuretic.  Beta-blocker has been increased summer 2022.  ROS: Neurological: no tremors/+ numbness/+ tingling/no dizziness  I reviewed pt's medications, allergies, PMH, social hx, family hx, and changes were documented in the history of present illness. Otherwise, unchanged from my initial visit note.  Past Medical History:  Diagnosis Date   Arthritis    mild   Depression    Diabetes mellitus    Type 2   Endometriosis    GERD (gastroesophageal reflux disease)    Heart murmur    History of kidney stones    Hyperlipidemia    Hypertension    Multiple sclerosis ([place])    Type II or unspecified type diabetes mellitus with peripheral circulatory disorders, uncontrolled(250.72) 11/27/2012   Past Surgical History:  Procedure Laterality Date   EXPLORATORY LAPAROTOMY     endometriosis   EXTRACORPOREAL SHOCK WAVE LITHOTRIPSY Right 11/19/2016   Procedure: RIGHT EXTRACORPOREAL SHOCK WAVE LITHOTRIPSY (ESWL);  Surgeon: Festus Aloe, MD;  Location: [facility];  Service: Urology;  Laterality: Right;   LUMBAR DISC SURGERY     LS spine 2005 or so   WISDOM TOOTH EXTRACTION     Social History   Socioeconomic History   Marital status: Single    Spouse name: Not on file   Number of children: Not on file   Years of education: Not on file   Highest education level: Not on file  Occupational History   Not on file  Tobacco Use   Smoking status: Never   Smokeless tobacco: Never  Substance and Sexual Activity   Alcohol use: Yes    Alcohol/week: 0.0 standard drinks    Comment: occasional   Drug use: No   Sexual activity: Not on file  Other Topics Concern   Not on file  Social History Narrative   Single. Lives alone. No children. Pet cat.       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      Regular exercise: not lately   Caffeine use: daily; during to the week      Hobbies: travel, genealogy , read, paper craft   Social Determinants of Radio broadcast assistant Strain: Not on file   Food Insecurity: Not on file  Transportation Needs: Not on file  Physical Activity: Not on file  Stress: Not on file  Social Connections: Not on file  Intimate Partner Violence: Not on file   Current Outpatient Medications on File Prior to Visit  Medication Sig Dispense Refill   amLODipine (NORVASC) 5 MG tablet TAKE 1 TABLET DAILY 180 tablet 3   atorvastatin (LIPITOR) 20 MG tablet TAKE 1 TABLET DAILY 90 tablet 3   azelastine (ASTELIN) 0.1 % nasal spray Place into both nostrils 2 (two) times daily. Use in each nostril  as directed     carvedilol (COREG) 12.5 MG tablet Take 1 tablet (12.5 mg total) by mouth 2 (two) times daily. 180 tablet 3   Continuous Blood Gluc Receiver (FREESTYLE LIBRE 14 DAY READER) DEVI 1 kit by Does not apply route See admin instructions. For continuous glucose monitoring 1 each 0   Continuous Blood Gluc Sensor (FREESTYLE LIBRE 14 DAY SENSOR) MISC CHANGE SENSOR EVERY 14 DAYS 6 each 3   DULoxetine (CYMBALTA) 60 MG capsule TAKE 1 CAPSULE DAILY (NEED APPOINTMENT) 180 capsule 0   fluticasone (FLONASE) 50 MCG/ACT nasal spray Place 2 sprays into both nostrils as needed.     HUMALOG 100 UNIT/ML injection INJECT 76 UNITS UNDER THE SKIN ONCE AS DIRECTED WITH VGO 40 70 mL 1   hydrochlorothiazide (HYDRODIURIL) 25 MG tablet Take 1 tablet (25 mg total) by mouth daily. 90 tablet 3   Insulin Disposable Pump (V-GO 40) KIT USE ONCE DAILY 90 kit 1   insulin lispro (HUMALOG) 100 UNIT/ML injection INJECT 76 UNITS UNDER THE SKIN ONCE AS DIRECTED WITH VGO 40 70 mL 3   Insulin Pen Needle 32G X 4 MM MISC Use 1x a day 100 each 3   levocetirizine (XYZAL) 5 MG tablet Take 5 mg by mouth every evening.     levonorgestrel-ethinyl estradiol (LYBREL,AMETHYST) 90-20 MCG tablet Take 1 tablet by mouth daily.      Melatonin 5 MG CAPS Take by mouth.     metFORMIN (GLUCOPHAGE-XR) 500 MG 24 hr tablet TAKE 2 TABLETS (1,000 MG TOTAL) TWICE A DAY WITH A MEAL 360 tablet 3   modafinil (PROVIGIL) 200 MG tablet  Take 1 tablet (200 mg total) by mouth daily as needed. 90 tablet 1   Naproxen Sodium 220 MG CAPS Take 440 mg by mouth 2 (two) times daily as needed (for pain.).      natalizumab (TYSABRI) 300 MG/15ML injection Inject 300 mg into the vein every 30 (thirty) days.     OZEMPIC, 1 MG/DOSE, 4 MG/3ML SOPN INJECT 1 MG UNDER THE SKIN ONCE A WEEK 9 mL 3   Probiotic Product (PROBIOTIC PO) Take by mouth.     No current facility-administered medications on file prior to visit.   Allergies  Allergen Reactions   Ace Inhibitors Swelling    ANGIOEDEMA   Shingrix [Zoster Vac Recomb Adjuvanted]     ould not walk for 30 hours after first vaccine- recommended avoiding 2nd vaccine - had 2 falls during that time - possible GBS- discuss with Dr. Felecia Shelling - never had issues with flu shot -gradually improved    Family History  Problem Relation Age of Onset   Breast cancer Mother        early 49s   Diabetes Mother        maternal grandmother as well   Diabetes Father    Other Father        progressive supranuclear palsy   Colon cancer Neg Hx    Colon polyps Neg Hx    Esophageal cancer Neg Hx    Rectal cancer Neg Hx    Stomach cancer Neg Hx     PE: BP 140/88 (BP Location: Right Arm, Patient Position: Sitting, Cuff Size: Normal)    Pulse 96    Ht '5\' 6"'  (1.676 m)    Wt 233 lb (105.7 kg)    SpO2 96%    BMI 37.61 kg/m   Wt Readings from Last 3 Encounters:  03/09/21 233 lb (105.7 kg)  03/08/21 232 lb (105.2  kg)  03/06/21 235 lb 9.6 oz (106.9 kg)   Constitutional: overweight, in NAD Eyes: PERRLA, EOMI, no exophthalmos ENT: moist mucous membranes, no thyromegaly, no cervical lymphadenopathy Cardiovascular: tachycardia, RR, No RG, +1/6 SEM Respiratory: CTA B Musculoskeletal: no deformities, strength intact in all 4 Skin: moist, warm, no rashes Neurological: no tremor with outstretched hands, DTR normal in all 4  ASSESSMENT: 1. DM2, insulin-dependent, uncontrolled, with complications: - DR  She saw  the diabetes educator for a discussion about insulin pumps >> decided for a VGo  2. Obesity class 2 BMI Classification: < 18.5 underweight  18.5-24.9 normal weight  25.0-29.9 overweight  30.0-34.9 class I obesity  35.0-39.9 class II obesity  ? 40.0 class III obesity   3. HL  PLAN:  1. Patient with history of uncontrolled type 2 diabetes, exacerbated by steroid courses for MS and also extensive traveling for work.  Her sugars improved last year, to an HbA1c of 6.9%, however, at last visit, this increased to 7.4%.  Reviewing her CGM tracings at that time, her sugars appear to be controlled during the night but with occasional lower values, <70s, in the early morning hours.  I recommended to change her VGo reservoir from 40 to 30 units.  We did not change Humalog before meals but advised her that if the sugars increase after this change, to let me know, to possibly increase her Ozempic dose from 1 to 2 mg weekly.  She did not contact me about this.  She returns now after 6 months. CGM interpretation: -At today's visit, we reviewed her CGM downloads: It appears that 77% of values are in target range (goal >70%), while 17% are higher than 180 (goal <25%), and 6% are lower than 70 (goal <4%).  The calculated average blood sugar is 136.   -Reviewing the CGM trends, it appears that she has quite a bit of loss of data in her ambulatory glucose profile.  However, it is obvious that the sugars are variable overnight, some of them being low in the normal range and they decrease it for more around 10 AM, under 70.  Later in the day, sugars increase, especially after dinner.  Upon questioning, she continues on the V-Go 40 (switched back since last visit due to problems getting supplies).  She is also getting a lower dose of insulin before dinner than recommended.  At this visit, I advised her to decrease the dose of insulin with breakfast and increase it slightly before dinner.  We will also go back to the V-Go  30 to avoid further low blood sugars.  We will try to increase the Ozempic further for better mealtime coverage. -  I advised her to:  Patient Instructions  Please continue: - Metformin ER 1000 mg 2x a day  Please increase: - Ozempic 2 mg weekly  Please decrease: - VGo 30 with Humalog 15 min before meals: 3-4 clicks before b'fast 5-6 clicks before lunch 6-96 clicks before dinner  Please return in 4 months.  - we checked her HbA1c: 7.3% (slightly higher) - advised to check sugars at different times of the day - 4x a day, rotating check times - advised for yearly eye exams >> she is UTD - return to clinic in 4 months  2. Obesity class 2 -We will continue Ozempic and increase the dose which should also help with weight loss -At last visit we also reduced her insulin doses, which should further help with weight loss. -Weight is approximately stable at this  visit  3. HL -Reviewed latest lipid panel from 07/2020: LDL much improved, at goal, HDL slightly low: Lab Results  Component Value Date   CHOL 106 07/22/2020   HDL 35.90 (L) 07/22/2020   LDLCALC 39 07/22/2020   LDLDIRECT 125.0 08/21/2017   TRIG 153.0 (H) 07/22/2020   CHOLHDL 3 07/22/2020  -She tried pravastatin and simvastatin but she did not like how this made her feel.  However, she is now on Lipitor 20 mg daily and she tolerates this well.  [name], MD PhD [facility]

## 2021-03-09 NOTE — Patient Instructions (Signed)
Please continue: - Metformin ER 1000 mg 2x a day  Please increase: - Ozempic 2 mg weekly  Please decrease: - VGo 30 with Humalog 15 min before meals: 3-4 clicks before b'fast 5-6 clicks before lunch 8-18 clicks before dinner  Please return in 4 months.

## 2021-03-10 ENCOUNTER — Telehealth: Payer: Self-pay | Admitting: *Deleted

## 2021-03-10 NOTE — Telephone Encounter (Signed)
No answer on first attempt follow up call. Left message.  ?

## 2021-03-10 NOTE — Telephone Encounter (Signed)
°  Follow up Call-  Call back number 03/08/2021  Post procedure Call Back phone  # [phone]  Permission to leave phone message Yes  Some recent data might be hidden     Patient questions:  Do you have a fever, pain , or abdominal swelling? No. Pain Score  0 *  Have you tolerated food without any problems? Yes.    Have you been able to return to your normal activities? Yes.    Do you have any questions about your discharge instructions: Diet   No. Medications  No. Follow up visit  No.  Do you have questions or concerns about your Care? No.  Actions: * If pain score is 4 or above: No action needed, pain <4.  Have you developed a fever since your procedure? no  2.   Have you had an respiratory symptoms (SOB or cough) since your procedure? no  3.   Have you tested positive for COVID 19 since your procedure no  4.   Have you had any family members/close contacts diagnosed with the COVID 19 since your procedure?  no   If yes to any of these questions please route to Joylene John, RN and Joella Prince, RN

## 2021-03-13 ENCOUNTER — Telehealth: Payer: Self-pay | Admitting: Neurology

## 2021-03-13 DIAGNOSIS — G35 Multiple sclerosis: Secondary | ICD-10-CM | POA: Diagnosis not present

## 2021-03-13 MED ORDER — METHYLPREDNISOLONE 4 MG PO TBPK: ORAL_TABLET | ORAL | 0 refills | Status: DC

## 2021-03-13 NOTE — Telephone Encounter (Signed)
Dr. Felecia Shelling- would you like her to see PCP first or ok to schedule appt with you for new eval/sciatic pain?  Called pt back. States she had very minor sciatic pain years ago. Sciatic pain started two weeks ago but was improving so she did not mention at last visit with Amy on 03/06/21. Pain worsened over the weekend. Did not do anything specific to bring on pain. She has never discussed w/ MD or NP in the past. Advised I will have to run this by MD. May want her to address w/ PCP first and then f/u with [place] if PCP feels it is necessary.

## 2021-03-13 NOTE — Addendum Note (Signed)
Addended by: Wyvonnia Lora on: 03/13/2021 03:34 PM   Modules accepted: Orders

## 2021-03-13 NOTE — Telephone Encounter (Signed)
Called and spoke w/ pt. Relayed recommendation per Dr. Felecia Shelling. She is agreeable to plan. I e-scribed methylprednisolone 4mg , directions 6 tabs day 1, then decrease by 1 tab daily until finished. She is diabetic. She will reach out to endocrinologist to confirm ok to take.

## 2021-03-13 NOTE — Telephone Encounter (Signed)
Patient was here today for infusion and asked for an appointment with either Amy or Dr. Felecia Shelling. First available was for Dr. Felecia Shelling in April, but she said that was too long and is asking for an emergent appointment for sciatic nerve pain.

## 2021-03-13 NOTE — Progress Notes (Signed)
Kristin Mathews,  Good news: the polyp (or polyps) that I removed during your recent examination were NOT precancerous.  You should continue to follow current colorectal cancer screening guidelines with a repeat colonoscopy in 10 years.    If you develop any new rectal bleeding, abdominal pain or significant bowel habit changes, please contact me before then.

## 2021-04-04 DIAGNOSIS — M9904 Segmental and somatic dysfunction of sacral region: Secondary | ICD-10-CM | POA: Diagnosis not present

## 2021-04-04 DIAGNOSIS — M9905 Segmental and somatic dysfunction of pelvic region: Secondary | ICD-10-CM | POA: Diagnosis not present

## 2021-04-04 DIAGNOSIS — M5442 Lumbago with sciatica, left side: Secondary | ICD-10-CM | POA: Diagnosis not present

## 2021-04-04 DIAGNOSIS — M9903 Segmental and somatic dysfunction of lumbar region: Secondary | ICD-10-CM | POA: Diagnosis not present

## 2021-04-05 DIAGNOSIS — M9905 Segmental and somatic dysfunction of pelvic region: Secondary | ICD-10-CM | POA: Diagnosis not present

## 2021-04-05 DIAGNOSIS — M5442 Lumbago with sciatica, left side: Secondary | ICD-10-CM | POA: Diagnosis not present

## 2021-04-05 DIAGNOSIS — M9903 Segmental and somatic dysfunction of lumbar region: Secondary | ICD-10-CM | POA: Diagnosis not present

## 2021-04-05 DIAGNOSIS — M9904 Segmental and somatic dysfunction of sacral region: Secondary | ICD-10-CM | POA: Diagnosis not present

## 2021-04-10 DIAGNOSIS — M5442 Lumbago with sciatica, left side: Secondary | ICD-10-CM | POA: Diagnosis not present

## 2021-04-10 DIAGNOSIS — M9905 Segmental and somatic dysfunction of pelvic region: Secondary | ICD-10-CM | POA: Diagnosis not present

## 2021-04-10 DIAGNOSIS — M9903 Segmental and somatic dysfunction of lumbar region: Secondary | ICD-10-CM | POA: Diagnosis not present

## 2021-04-10 DIAGNOSIS — M9904 Segmental and somatic dysfunction of sacral region: Secondary | ICD-10-CM | POA: Diagnosis not present

## 2021-04-12 DIAGNOSIS — M5442 Lumbago with sciatica, left side: Secondary | ICD-10-CM | POA: Diagnosis not present

## 2021-04-12 DIAGNOSIS — M9905 Segmental and somatic dysfunction of pelvic region: Secondary | ICD-10-CM | POA: Diagnosis not present

## 2021-04-12 DIAGNOSIS — M9903 Segmental and somatic dysfunction of lumbar region: Secondary | ICD-10-CM | POA: Diagnosis not present

## 2021-04-12 DIAGNOSIS — M9904 Segmental and somatic dysfunction of sacral region: Secondary | ICD-10-CM | POA: Diagnosis not present

## 2021-04-17 DIAGNOSIS — M9904 Segmental and somatic dysfunction of sacral region: Secondary | ICD-10-CM | POA: Diagnosis not present

## 2021-04-17 DIAGNOSIS — M5442 Lumbago with sciatica, left side: Secondary | ICD-10-CM | POA: Diagnosis not present

## 2021-04-17 DIAGNOSIS — M9905 Segmental and somatic dysfunction of pelvic region: Secondary | ICD-10-CM | POA: Diagnosis not present

## 2021-04-17 DIAGNOSIS — M9903 Segmental and somatic dysfunction of lumbar region: Secondary | ICD-10-CM | POA: Diagnosis not present

## 2021-04-19 DIAGNOSIS — M9903 Segmental and somatic dysfunction of lumbar region: Secondary | ICD-10-CM | POA: Diagnosis not present

## 2021-04-19 DIAGNOSIS — M5442 Lumbago with sciatica, left side: Secondary | ICD-10-CM | POA: Diagnosis not present

## 2021-04-19 DIAGNOSIS — M9905 Segmental and somatic dysfunction of pelvic region: Secondary | ICD-10-CM | POA: Diagnosis not present

## 2021-04-19 DIAGNOSIS — M9904 Segmental and somatic dysfunction of sacral region: Secondary | ICD-10-CM | POA: Diagnosis not present

## 2021-04-24 DIAGNOSIS — M9903 Segmental and somatic dysfunction of lumbar region: Secondary | ICD-10-CM | POA: Diagnosis not present

## 2021-04-24 DIAGNOSIS — M9904 Segmental and somatic dysfunction of sacral region: Secondary | ICD-10-CM | POA: Diagnosis not present

## 2021-04-24 DIAGNOSIS — M9905 Segmental and somatic dysfunction of pelvic region: Secondary | ICD-10-CM | POA: Diagnosis not present

## 2021-04-24 DIAGNOSIS — M5442 Lumbago with sciatica, left side: Secondary | ICD-10-CM | POA: Diagnosis not present

## 2021-04-26 DIAGNOSIS — M5442 Lumbago with sciatica, left side: Secondary | ICD-10-CM | POA: Diagnosis not present

## 2021-04-26 DIAGNOSIS — G35 Multiple sclerosis: Secondary | ICD-10-CM | POA: Diagnosis not present

## 2021-04-26 DIAGNOSIS — M9905 Segmental and somatic dysfunction of pelvic region: Secondary | ICD-10-CM | POA: Diagnosis not present

## 2021-04-26 DIAGNOSIS — M9904 Segmental and somatic dysfunction of sacral region: Secondary | ICD-10-CM | POA: Diagnosis not present

## 2021-04-26 DIAGNOSIS — M9903 Segmental and somatic dysfunction of lumbar region: Secondary | ICD-10-CM | POA: Diagnosis not present

## 2021-04-29 ENCOUNTER — Other Ambulatory Visit: Payer: Self-pay | Admitting: Internal Medicine

## 2021-05-01 DIAGNOSIS — M5442 Lumbago with sciatica, left side: Secondary | ICD-10-CM | POA: Diagnosis not present

## 2021-05-01 DIAGNOSIS — M9904 Segmental and somatic dysfunction of sacral region: Secondary | ICD-10-CM | POA: Diagnosis not present

## 2021-05-01 DIAGNOSIS — M9903 Segmental and somatic dysfunction of lumbar region: Secondary | ICD-10-CM | POA: Diagnosis not present

## 2021-05-01 DIAGNOSIS — M9905 Segmental and somatic dysfunction of pelvic region: Secondary | ICD-10-CM | POA: Diagnosis not present

## 2021-05-03 DIAGNOSIS — M5442 Lumbago with sciatica, left side: Secondary | ICD-10-CM | POA: Diagnosis not present

## 2021-05-03 DIAGNOSIS — M9905 Segmental and somatic dysfunction of pelvic region: Secondary | ICD-10-CM | POA: Diagnosis not present

## 2021-05-03 DIAGNOSIS — M9904 Segmental and somatic dysfunction of sacral region: Secondary | ICD-10-CM | POA: Diagnosis not present

## 2021-05-03 DIAGNOSIS — M9903 Segmental and somatic dysfunction of lumbar region: Secondary | ICD-10-CM | POA: Diagnosis not present

## 2021-05-08 DIAGNOSIS — M5442 Lumbago with sciatica, left side: Secondary | ICD-10-CM | POA: Diagnosis not present

## 2021-05-08 DIAGNOSIS — M9905 Segmental and somatic dysfunction of pelvic region: Secondary | ICD-10-CM | POA: Diagnosis not present

## 2021-05-08 DIAGNOSIS — M9904 Segmental and somatic dysfunction of sacral region: Secondary | ICD-10-CM | POA: Diagnosis not present

## 2021-05-08 DIAGNOSIS — M9903 Segmental and somatic dysfunction of lumbar region: Secondary | ICD-10-CM | POA: Diagnosis not present

## 2021-05-11 DIAGNOSIS — M9905 Segmental and somatic dysfunction of pelvic region: Secondary | ICD-10-CM | POA: Diagnosis not present

## 2021-05-11 DIAGNOSIS — M9904 Segmental and somatic dysfunction of sacral region: Secondary | ICD-10-CM | POA: Diagnosis not present

## 2021-05-11 DIAGNOSIS — M9903 Segmental and somatic dysfunction of lumbar region: Secondary | ICD-10-CM | POA: Diagnosis not present

## 2021-05-11 DIAGNOSIS — M5442 Lumbago with sciatica, left side: Secondary | ICD-10-CM | POA: Diagnosis not present

## 2021-05-15 DIAGNOSIS — M9903 Segmental and somatic dysfunction of lumbar region: Secondary | ICD-10-CM | POA: Diagnosis not present

## 2021-05-15 DIAGNOSIS — M5442 Lumbago with sciatica, left side: Secondary | ICD-10-CM | POA: Diagnosis not present

## 2021-05-15 DIAGNOSIS — M9905 Segmental and somatic dysfunction of pelvic region: Secondary | ICD-10-CM | POA: Diagnosis not present

## 2021-05-15 DIAGNOSIS — M9904 Segmental and somatic dysfunction of sacral region: Secondary | ICD-10-CM | POA: Diagnosis not present

## 2021-05-18 DIAGNOSIS — M9904 Segmental and somatic dysfunction of sacral region: Secondary | ICD-10-CM | POA: Diagnosis not present

## 2021-05-18 DIAGNOSIS — M9903 Segmental and somatic dysfunction of lumbar region: Secondary | ICD-10-CM | POA: Diagnosis not present

## 2021-05-18 DIAGNOSIS — M5442 Lumbago with sciatica, left side: Secondary | ICD-10-CM | POA: Diagnosis not present

## 2021-05-18 DIAGNOSIS — M9905 Segmental and somatic dysfunction of pelvic region: Secondary | ICD-10-CM | POA: Diagnosis not present

## 2021-05-22 DIAGNOSIS — M9903 Segmental and somatic dysfunction of lumbar region: Secondary | ICD-10-CM | POA: Diagnosis not present

## 2021-05-22 DIAGNOSIS — M5442 Lumbago with sciatica, left side: Secondary | ICD-10-CM | POA: Diagnosis not present

## 2021-05-22 DIAGNOSIS — M9904 Segmental and somatic dysfunction of sacral region: Secondary | ICD-10-CM | POA: Diagnosis not present

## 2021-05-22 DIAGNOSIS — M9905 Segmental and somatic dysfunction of pelvic region: Secondary | ICD-10-CM | POA: Diagnosis not present

## 2021-05-23 DIAGNOSIS — Z1231 Encounter for screening mammogram for malignant neoplasm of breast: Secondary | ICD-10-CM | POA: Diagnosis not present

## 2021-05-23 DIAGNOSIS — Z6835 Body mass index (BMI) 35.0-35.9, adult: Secondary | ICD-10-CM | POA: Diagnosis not present

## 2021-05-23 DIAGNOSIS — Z01419 Encounter for gynecological examination (general) (routine) without abnormal findings: Secondary | ICD-10-CM | POA: Diagnosis not present

## 2021-05-23 DIAGNOSIS — Z760 Encounter for issue of repeat prescription: Secondary | ICD-10-CM | POA: Diagnosis not present

## 2021-05-29 ENCOUNTER — Encounter: Payer: BC Managed Care – PPO | Admitting: Cardiothoracic Surgery

## 2021-05-29 DIAGNOSIS — M9903 Segmental and somatic dysfunction of lumbar region: Secondary | ICD-10-CM | POA: Diagnosis not present

## 2021-05-29 DIAGNOSIS — M9904 Segmental and somatic dysfunction of sacral region: Secondary | ICD-10-CM | POA: Diagnosis not present

## 2021-05-29 DIAGNOSIS — M5442 Lumbago with sciatica, left side: Secondary | ICD-10-CM | POA: Diagnosis not present

## 2021-05-29 DIAGNOSIS — M9905 Segmental and somatic dysfunction of pelvic region: Secondary | ICD-10-CM | POA: Diagnosis not present

## 2021-06-05 DIAGNOSIS — M9903 Segmental and somatic dysfunction of lumbar region: Secondary | ICD-10-CM | POA: Diagnosis not present

## 2021-06-05 DIAGNOSIS — M545 Low back pain, unspecified: Secondary | ICD-10-CM | POA: Diagnosis not present

## 2021-06-05 DIAGNOSIS — M9904 Segmental and somatic dysfunction of sacral region: Secondary | ICD-10-CM | POA: Diagnosis not present

## 2021-06-05 DIAGNOSIS — M9905 Segmental and somatic dysfunction of pelvic region: Secondary | ICD-10-CM | POA: Diagnosis not present

## 2021-06-07 DIAGNOSIS — G35 Multiple sclerosis: Secondary | ICD-10-CM | POA: Diagnosis not present

## 2021-06-07 DIAGNOSIS — M9904 Segmental and somatic dysfunction of sacral region: Secondary | ICD-10-CM | POA: Diagnosis not present

## 2021-06-07 DIAGNOSIS — M9903 Segmental and somatic dysfunction of lumbar region: Secondary | ICD-10-CM | POA: Diagnosis not present

## 2021-06-07 DIAGNOSIS — M9905 Segmental and somatic dysfunction of pelvic region: Secondary | ICD-10-CM | POA: Diagnosis not present

## 2021-06-07 DIAGNOSIS — M545 Low back pain, unspecified: Secondary | ICD-10-CM | POA: Diagnosis not present

## 2021-06-29 ENCOUNTER — Other Ambulatory Visit: Payer: Self-pay | Admitting: Family Medicine

## 2021-07-10 ENCOUNTER — Other Ambulatory Visit: Payer: Self-pay | Admitting: Family Medicine

## 2021-07-20 DIAGNOSIS — Z803 Family history of malignant neoplasm of breast: Secondary | ICD-10-CM | POA: Diagnosis not present

## 2021-07-20 DIAGNOSIS — Z13820 Encounter for screening for osteoporosis: Secondary | ICD-10-CM | POA: Diagnosis not present

## 2021-07-24 ENCOUNTER — Other Ambulatory Visit: Payer: Self-pay | Admitting: *Deleted

## 2021-07-24 ENCOUNTER — Encounter: Payer: Self-pay | Admitting: Family Medicine

## 2021-07-24 ENCOUNTER — Other Ambulatory Visit (INDEPENDENT_AMBULATORY_CARE_PROVIDER_SITE_OTHER): Payer: Self-pay

## 2021-07-24 DIAGNOSIS — Z0289 Encounter for other administrative examinations: Secondary | ICD-10-CM

## 2021-07-24 DIAGNOSIS — G35 Multiple sclerosis: Secondary | ICD-10-CM

## 2021-07-24 DIAGNOSIS — Z79899 Other long term (current) drug therapy: Secondary | ICD-10-CM

## 2021-07-24 NOTE — Progress Notes (Signed)
Placed [facility] lab in [facility] lock box for routine lab pick up. Results pending. 

## 2021-07-25 LAB — CBC WITH DIFFERENTIAL/PLATELET
Basophils Absolute: 0.1 10*3/uL (ref 0.0–0.2)
Basos: 1 %
EOS (ABSOLUTE): 0.5 10*3/uL — ABNORMAL HIGH (ref 0.0–0.4)
Eos: 5 %
Hematocrit: 41.3 % (ref 34.0–46.6)
Hemoglobin: 13.5 g/dL (ref 11.1–15.9)
Immature Grans (Abs): 0 10*3/uL (ref 0.0–0.1)
Immature Granulocytes: 0 %
Lymphocytes Absolute: 4.5 10*3/uL — ABNORMAL HIGH (ref 0.7–3.1)
Lymphs: 41 %
MCH: 28.7 pg (ref 26.6–33.0)
MCHC: 32.7 g/dL (ref 31.5–35.7)
MCV: 88 fL (ref 79–97)
Monocytes Absolute: 0.5 10*3/uL (ref 0.1–0.9)
Monocytes: 5 %
Neutrophils Absolute: 5.2 10*3/uL (ref 1.4–7.0)
Neutrophils: 48 %
Platelets: 362 10*3/uL (ref 150–450)
RBC: 4.71 x10E6/uL (ref 3.77–5.28)
RDW: 13.2 % (ref 11.7–15.4)
WBC: 10.9 10*3/uL — ABNORMAL HIGH (ref 3.4–10.8)

## 2021-07-27 ENCOUNTER — Ambulatory Visit: Payer: BC Managed Care – PPO | Admitting: Internal Medicine

## 2021-07-31 ENCOUNTER — Telehealth: Payer: Self-pay | Admitting: *Deleted

## 2021-07-31 NOTE — Telephone Encounter (Signed)
JCV ab drawn on 07/24/21 indeterminate, index: 0.29. Inhibition assay: negative.  Next Tysabri infusion scheduled for 09/04/21.

## 2021-08-08 ENCOUNTER — Telehealth: Payer: Self-pay | Admitting: Pharmacy Technician

## 2021-08-08 ENCOUNTER — Other Ambulatory Visit (HOSPITAL_COMMUNITY): Payer: Self-pay

## 2021-08-08 NOTE — Telephone Encounter (Signed)
Patient Advocate Encounter Received a letter that the PA request for Ozempic wasn't approved b/c they weren't able to get the needed information from the office. Finding no corspondence that they had contacted the office, or that we had requested the PA, I called [employer] at [phone] to see what was needed for approval. They only needed her diagnosis code.   Prior Authorization for Jobe Marker has been approved.    PA# case ID # [id number] Effective dates: 07/09/21 through 08/08/22  Per rep on the phone a 90 day supply should be about $75, and a 30 day supply $25.   Pt uses mail order and iwll need to call them to have this filled.

## 2021-08-11 ENCOUNTER — Other Ambulatory Visit: Payer: Self-pay | Admitting: Family Medicine

## 2021-09-04 DIAGNOSIS — G35 Multiple sclerosis: Secondary | ICD-10-CM | POA: Diagnosis not present

## 2021-09-05 ENCOUNTER — Encounter: Payer: Self-pay | Admitting: Neurology

## 2021-09-05 ENCOUNTER — Ambulatory Visit (INDEPENDENT_AMBULATORY_CARE_PROVIDER_SITE_OTHER): Payer: BC Managed Care – PPO | Admitting: Neurology

## 2021-09-05 ENCOUNTER — Telehealth: Payer: Self-pay

## 2021-09-05 VITALS — BP 138/98 | HR 78 | Ht 66.0 in | Wt 230.0 lb

## 2021-09-05 DIAGNOSIS — Z79899 Other long term (current) drug therapy: Secondary | ICD-10-CM

## 2021-09-05 DIAGNOSIS — G4710 Hypersomnia, unspecified: Secondary | ICD-10-CM | POA: Diagnosis not present

## 2021-09-05 DIAGNOSIS — G35 Multiple sclerosis: Secondary | ICD-10-CM

## 2021-09-05 DIAGNOSIS — R269 Unspecified abnormalities of gait and mobility: Secondary | ICD-10-CM | POA: Diagnosis not present

## 2021-09-05 DIAGNOSIS — E119 Type 2 diabetes mellitus without complications: Secondary | ICD-10-CM

## 2021-09-05 MED ORDER — MODAFINIL 200 MG PO TABS: 200.0000 mg | ORAL_TABLET | Freq: Every day | ORAL | 1 refills | Status: DC | PRN

## 2021-09-05 NOTE — Telephone Encounter (Signed)
Called and lvm for pt advising PA was approved for Ozempic back in July.

## 2021-09-05 NOTE — Telephone Encounter (Signed)
Pt lvm regarding PA for Ozempic.

## 2021-09-05 NOTE — Progress Notes (Signed)
[facility]  PATIENT: Kristin Mathews DOB: 14-Mar-1970  REFERRING CLINICIAN: Dr. Leanne Chang  HISTORY FROM: Patient  REASON FOR VISIT: MS   HISTORICAL  CHIEF COMPLAINT:  Chief Complaint  Patient presents with   Follow-up    Pt alone, rm 2. Overall stable. No issues or concerns. If she can get a handicap placard due to parking at work being far out. Last tysabri infusion 8/21 through [facility]    HISTORY OF PRESENT ILLNESS:  Kristin Mathews is a 51 y.o. woman with relapsing remitting MS dx 2001.  Update 09/05/2021: Her MS continues to be stable on Tysabri.  She tolerates it well and has not had any exacerbations.  She was JCV antibody negative when last checked 07/31/2021  and was negativeat 0.29. Marland Kitchen   She had one low positive readng.   Her last brain MRI 01/20/2018 showed an overall small plaque burden.  We have no recent MRI of the cervical spine.  Gait is ok on a flat surface but balance is off and she uses the banister to go up and down stairs. She has had a couple falls either because of reduced balance or foot drop.  Strength and sensation is usually ok except mils foot tingling (also has IDDM T2).     She denies any difficulty with her bladder.  Vision function is ok but she has had diabetic retinopathy that is being followed closely.    She notes notes fatigue and does better after nap.    She is sleeping well most nights (6-8 hours during the work week and 12-15 hours on weekends).   Modafinil helps some.  Due to HTN prefers not to try a stimulant.   She has had a PSG in the past and she had no OSA.    She denies any difficulties with cognition.  Her mood is fine.  She had her first Singrix vaccination in June concurrent with her covid booster.   She felt weaker x 48 hours and had bladder control issues.     Her job has changed and she will have less travel (she enjoyed that aspect of the job.   MS History: She presented with unilateral numbness in May 2001 and was  diagnosed with transverse myelitis.    MRI only showed one spot and an LP was non-diagnostic.   She had another episode of numbness 6 months later and MRI showed several newer lesions.    She was placed on REbif as part of the Rebiject study.     She had an exacerbation in 2007 and in 2010 with numbness and then optic neuritis.   In 2015, she had another numbness exacerbation and we decided to switch to Tysabri.    She tolerates Tysabri well.    Imaging: MRI of the brain 01/20/2018 showed T2/flair hyperintense foci in the periventricular, juxtacortical and deep white matter.  These are consistent with chronic demyelinating plaque associated with multiple sclerosis though some of the foci are nonspecific.  REVIEW OF SYSTEMS:  Constitutional: No fevers, chills, sweats, or change in appetite.   Has fatigue.  Has Poor sleep Eyes: No visual changes, double vision, eye pain Ear, nose and throat: No hearing loss, ear pain, nasal congestion, sore throat Cardiovascular: No chest pain, palpitations Respiratory:  No shortness of breath at rest or with exertion.   No wheezes.   Snoring GastrointestinaI: No nausea, vomiting, diarrhea, abdominal pain.   Rare fecal incontinence Genitourinary:    She reports urgency with rare  incontinence Musculoskeletal:  No neck pain, back pain Integumentary: No rash, pruritus, skin lesions Neurological: as above Psychiatric: No depression at this time.  No anxiety Endocrine: No palpitations, diaphoresis, change in appetite, change in weigh or increased thirst Hematologic/Lymphatic:  No anemia, purpura, petechiae. Allergic/Immunologic: No itchy/runny eyes, nasal congestion, recent allergic reactions, rashes  ALLERGIES: Allergies  Allergen Reactions   Ace Inhibitors Swelling    ANGIOEDEMA    HOME MEDICATIONS: Outpatient Medications Prior to Visit  Medication Sig Dispense Refill   amLODipine (NORVASC) 5 MG tablet TAKE 1 TABLET DAILY 180 tablet 3   azelastine (ASTELIN)  0.1 % nasal spray Place into both nostrils 2 (two) times daily. Use in each nostril as directed     carvedilol (COREG) 12.5 MG tablet TAKE 1 TABLET TWICE A DAY 180 tablet 3   Continuous Blood Gluc Receiver (FREESTYLE LIBRE 14 DAY READER) DEVI 1 kit by Does not apply route See admin instructions. For continuous glucose monitoring 1 each 0   Continuous Blood Gluc Sensor (FREESTYLE LIBRE 14 DAY SENSOR) MISC CHANGE SENSOR EVERY 14 DAYS 6 each 3   DULoxetine (CYMBALTA) 60 MG capsule TAKE 1 CAPSULE DAILY (NEED APPOINTMENT) 180 capsule 0   fluticasone (FLONASE) 50 MCG/ACT nasal spray Place 2 sprays into both nostrils as needed.     HUMALOG 100 UNIT/ML injection INJECT 76 UNITS UNDER THE SKIN ONCE AS DIRECTED WITH VGO 40 (Patient taking differently: Inject 30 Units into the skin.) 70 mL 3   hydrochlorothiazide (HYDRODIURIL) 25 MG tablet Take 1 tablet (25 mg total) by mouth daily. 90 tablet 3   Insulin Disposable Pump (V-GO 30) KIT use 1x a day 90 kit 3   insulin lispro (HUMALOG) 100 UNIT/ML injection INJECT 76 UNITS UNDER THE SKIN ONCE AS DIRECTED WITH VGO 40 70 mL 3   Insulin Pen Needle 32G X 4 MM MISC Use 1x a day 100 each 3   levocetirizine (XYZAL) 5 MG tablet Take 5 mg by mouth every evening.     levonorgestrel-ethinyl estradiol (LYBREL,AMETHYST) 90-20 MCG tablet Take 1 tablet by mouth daily.      Melatonin 5 MG CAPS Take by mouth.     metFORMIN (GLUCOPHAGE-XR) 500 MG 24 hr tablet TAKE 2 TABLETS (1,000 MG TOTAL) TWICE A DAY WITH A MEAL 360 tablet 3   Naproxen Sodium 220 MG CAPS Take 440 mg by mouth 2 (two) times daily as needed (for pain.).      natalizumab (TYSABRI) 300 MG/15ML injection Inject 300 mg into the vein every 30 (thirty) days.     Probiotic Product (PROBIOTIC PO) Take by mouth.     Semaglutide, 2 MG/DOSE, 8 MG/3ML SOPN Inject 2 mg as directed once a week. 9 mL 3   modafinil (PROVIGIL) 200 MG tablet Take 1 tablet (200 mg total) by mouth daily as needed. 90 tablet 1   atorvastatin  (LIPITOR) 20 MG tablet TAKE 1 TABLET DAILY 90 tablet 3   methylPREDNISolone (MEDROL DOSEPAK) 4 MG TBPK tablet Take 6 tablets on day 1 and decrease by 1 tablet each day until finished 21 tablet 0   No facility-administered medications prior to visit.    PAST MEDICAL HISTORY: Past Medical History:  Diagnosis Date   Arthritis    mild   Depression    Diabetes mellitus    Type 2   Endometriosis    GERD (gastroesophageal reflux disease)    Heart murmur    History of kidney stones    Hyperlipidemia    Hypertension  Multiple sclerosis ([place])    Type II or unspecified type diabetes mellitus with peripheral circulatory disorders, uncontrolled(250.72) 11/27/2012    PAST SURGICAL HISTORY: Past Surgical History:  Procedure Laterality Date   EXPLORATORY LAPAROTOMY     endometriosis   EXTRACORPOREAL SHOCK WAVE LITHOTRIPSY Right 11/19/2016   Procedure: RIGHT EXTRACORPOREAL SHOCK WAVE LITHOTRIPSY (ESWL);  Surgeon: Festus Aloe, MD;  Location: [facility];  Service: Urology;  Laterality: Right;   LUMBAR DISC SURGERY     LS spine 2005 or so   WISDOM TOOTH EXTRACTION      FAMILY HISTORY: Family History  Problem Relation Age of Onset   Breast cancer Mother        early 26s   Diabetes Mother        maternal grandmother as well   Diabetes Father    Other Father        progressive supranuclear palsy   Colon cancer Neg Hx    Colon polyps Neg Hx    Esophageal cancer Neg Hx    Rectal cancer Neg Hx    Stomach cancer Neg Hx     SOCIAL HISTORY:  Social History   Socioeconomic History   Marital status: Single    Spouse name: Not on file   Number of children: Not on file   Years of education: Not on file   Highest education level: Not on file  Occupational History   Not on file  Tobacco Use   Smoking status: Never   Smokeless tobacco: Never  Substance and Sexual Activity   Alcohol use: Yes    Alcohol/week: 0.0 standard drinks of alcohol    Comment: occasional   Drug use: No    Sexual activity: Not on file  Other Topics Concern   Not on file  Social History Narrative   Single. Lives alone. No children. Pet cat.       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   General: The patient is well-developed and well-nourished and in no acute distress   Neurologic Exam  Mental status: The patient is alert and oriented x 3 at the time of the examination. The patient has apparent normal recent and remote memory, with an apparently normal attention span and concentration ability.   Speech is normal.  Cranial nerves: Extraocular movements are full.  Her vision and, vision is symmetric.Marland Kitchen There is mild reduced right facial sensation to soft touch.  Facial strength is normal.  Trapezius and sternocleidomastoid strength is normal. No dysarthria is noted.   no obvious hearing deficits are noted.  Motor:  Muscle bulk and tone are normal. Strength is  5 / 5 in all 4 extremities.   Sensory: Sensory testing is intact to pinprick, soft touch, vibration sensation, and position sense in her hands and knees but mildly reduced vibration  sensation in the toes  Coordination: Cerebellar testing reveals Good finger-to-nose and heel-to-shin bilaterally.  Gait and station: Station is normal.  Gait is mildly wide..  The tandem gait is wide.  The Romberg is negative.  Reflexes: Deep tendon reflexes are symmetric and normal  bilaterally     DIAGNOSTIC DATA (LABS, IMAGING, TESTING) - I reviewed patient records, labs, notes, testing and imaging myself where available.  Lab Results  Component Value Date   WBC 10.9 (H) 07/24/2021   HGB 13.5 07/24/2021   HCT 41.3 07/24/2021   MCV 88 07/24/2021   PLT 362 07/24/2021      Component Value Date/Time   NA 139 07/22/2020 1347   NA 138 12/31/2017 1421   K 3.5 07/22/2020 1347   CL 101 07/22/2020 1347   CO2 29 07/22/2020 1347   GLUCOSE 209 (H) 07/22/2020 1347   BUN 13 07/22/2020 1347   BUN 14 12/31/2017 1421   CREATININE 0.93 07/22/2020 1347   CREATININE 0.73 04/30/2014 1109   CALCIUM 8.7 07/22/2020 1347   PROT 6.6 07/22/2020 1347   PROT 6.6 12/31/2017 1421   ALBUMIN 3.8 07/22/2020 1347   ALBUMIN 4.2 12/31/2017 1421   AST 19 07/22/2020 1347   ALT 24 07/22/2020 1347   ALKPHOS 72 07/22/2020 1347   BILITOT 0.3 07/22/2020 1347   BILITOT 0.2 12/31/2017 1421   GFRNONAA 78 12/31/2017 1421   GFRNONAA >89 04/30/2014 1109   GFRAA 90 12/31/2017 1421   GFRAA >89 04/30/2014 1109   Lab Results  Component Value Date   CHOL 106 07/22/2020   HDL 35.90 (L) 07/22/2020   LDLCALC 39 07/22/2020   LDLDIRECT 125.0 08/21/2017   TRIG 153.0 (H) 07/22/2020   CHOLHDL 3 07/22/2020   Lab Results  Component Value Date   HGBA1C 7.4 (A) 09/02/2020   No results found for: "VITAMINB12" Lab Results  Component Value Date   TSH 2.97 04/22/2018       ASSESSMENT AND PLAN  Relapsing remitting multiple sclerosis (HCC)  High risk medication use  Gait disturbance  Excessive sleepiness  Type 2 diabetes mellitus without complication, unspecified whether long term insulin use ([place])   1.   Continue Tysabri.  She is JCV ab negative.  2.   Stay active and exercise as tolerated.   3     she will continue Provigil for hypersomnia  4.   she will return to see me in 5-6 months if stable or sooner if she notes other new or worsening symptoms.  Taegen Delker A. Felecia Shelling, MD, PhD  7/68/1157, 2:62 PM Certified in Neurology, Clinical Neurophysiology, Sleep Medicine, Pain Medicine and Neuroimaging  [facility] [address], [place], [place] [address] [phone]

## 2021-09-07 ENCOUNTER — Other Ambulatory Visit: Payer: Self-pay | Admitting: Internal Medicine

## 2021-09-14 DIAGNOSIS — Z9189 Other specified personal risk factors, not elsewhere classified: Secondary | ICD-10-CM | POA: Diagnosis not present

## 2021-09-19 ENCOUNTER — Other Ambulatory Visit: Payer: Self-pay | Admitting: Obstetrics and Gynecology

## 2021-09-19 DIAGNOSIS — Z9189 Other specified personal risk factors, not elsewhere classified: Secondary | ICD-10-CM

## 2021-09-21 DIAGNOSIS — J3081 Allergic rhinitis due to animal (cat) (dog) hair and dander: Secondary | ICD-10-CM | POA: Diagnosis not present

## 2021-09-21 DIAGNOSIS — J3089 Other allergic rhinitis: Secondary | ICD-10-CM | POA: Diagnosis not present

## 2021-09-21 DIAGNOSIS — J301 Allergic rhinitis due to pollen: Secondary | ICD-10-CM | POA: Diagnosis not present

## 2021-09-21 DIAGNOSIS — J310 Chronic rhinitis: Secondary | ICD-10-CM | POA: Diagnosis not present

## 2021-09-27 ENCOUNTER — Other Ambulatory Visit: Payer: Self-pay | Admitting: Family Medicine

## 2021-10-09 ENCOUNTER — Encounter: Payer: Self-pay | Admitting: *Deleted

## 2021-10-18 ENCOUNTER — Other Ambulatory Visit: Payer: Self-pay | Admitting: *Deleted

## 2021-10-18 ENCOUNTER — Telehealth: Payer: Self-pay | Admitting: Neurology

## 2021-10-18 ENCOUNTER — Other Ambulatory Visit: Payer: Self-pay | Admitting: Family Medicine

## 2021-10-18 DIAGNOSIS — G35 Multiple sclerosis: Secondary | ICD-10-CM

## 2021-10-18 DIAGNOSIS — Z79899 Other long term (current) drug therapy: Secondary | ICD-10-CM

## 2021-10-18 NOTE — Telephone Encounter (Signed)
Placed JCV lab in [facility] lock box for routine lab pick up. Results pending. 

## 2021-10-26 NOTE — Telephone Encounter (Signed)
Received JCV results Index Value : 0.27 H JCV Antibody: Indeterminate Stratify JCV Antibody  Inhibition Assay: Final Results: NEGATIVE

## 2021-10-27 ENCOUNTER — Other Ambulatory Visit: Payer: Self-pay

## 2021-10-27 ENCOUNTER — Ambulatory Visit (INDEPENDENT_AMBULATORY_CARE_PROVIDER_SITE_OTHER): Payer: BC Managed Care – PPO | Admitting: Internal Medicine

## 2021-10-27 ENCOUNTER — Encounter: Payer: Self-pay | Admitting: Internal Medicine

## 2021-10-27 VITALS — BP 120/82 | HR 82 | Ht 66.0 in | Wt 229.0 lb

## 2021-10-27 DIAGNOSIS — E1159 Type 2 diabetes mellitus with other circulatory complications: Secondary | ICD-10-CM

## 2021-10-27 DIAGNOSIS — E669 Obesity, unspecified: Secondary | ICD-10-CM | POA: Diagnosis not present

## 2021-10-27 DIAGNOSIS — E66812 Obesity, class 2: Secondary | ICD-10-CM

## 2021-10-27 DIAGNOSIS — E1165 Type 2 diabetes mellitus with hyperglycemia: Secondary | ICD-10-CM

## 2021-10-27 DIAGNOSIS — E785 Hyperlipidemia, unspecified: Secondary | ICD-10-CM | POA: Diagnosis not present

## 2021-10-27 LAB — COMPREHENSIVE METABOLIC PANEL
ALT: 31 U/L (ref 0–35)
AST: 33 U/L (ref 0–37)
Albumin: 3.7 g/dL (ref 3.5–5.2)
Alkaline Phosphatase: 76 U/L (ref 39–117)
BUN: 15 mg/dL (ref 6–23)
CO2: 26 mEq/L (ref 19–32)
Calcium: 9.2 mg/dL (ref 8.4–10.5)
Chloride: 102 mEq/L (ref 96–112)
Creatinine, Ser: 0.89 mg/dL (ref 0.40–1.20)
GFR: 74.63 mL/min (ref >60.00)
Glucose, Bld: 128 mg/dL — ABNORMAL HIGH (ref 70–99)
Potassium: 3.5 mEq/L (ref 3.5–5.1)
Sodium: 139 mEq/L (ref 135–145)
Total Bilirubin: 0.3 mg/dL (ref 0.2–1.2)
Total Protein: 6.5 g/dL (ref 6.0–8.3)

## 2021-10-27 LAB — POCT GLYCOSYLATED HEMOGLOBIN (HGB A1C): Hemoglobin A1C: 8 % — AB (ref 4.0–5.6)

## 2021-10-27 LAB — LIPID PANEL
Cholesterol: 101 mg/dL (ref 0–200)
HDL: 32.3 mg/dL — ABNORMAL LOW (ref >39.00)
LDL Cholesterol: 37 mg/dL (ref 0–99)
NonHDL: 68.45
Total CHOL/HDL Ratio: 3
Triglycerides: 155 mg/dL — ABNORMAL HIGH (ref 0.0–149.0)
VLDL: 31 mg/dL (ref 0.0–40.0)

## 2021-10-27 LAB — MICROALBUMIN / CREATININE URINE RATIO
Creatinine,U: 196.8 mg/dL
Microalb Creat Ratio: 0.8 mg/g (ref 0.0–30.0)
Microalb, Ur: 1.7 mg/dL (ref 0.0–1.9)

## 2021-10-27 MED ORDER — FREESTYLE LIBRE 2 SENSOR MISC: 1.0000 | 3 refills | Status: DC

## 2021-10-27 MED ORDER — METFORMIN HCL ER 500 MG PO TB24: ORAL_TABLET | ORAL | 3 refills | Status: DC

## 2021-10-27 MED ORDER — FREESTYLE LIBRE 2 READER DEVI: 1.0000 | Freq: Every day | 3 refills | Status: DC

## 2021-10-27 MED ORDER — COVID-19 MRNA 2023-2024 VACCINE (COMIRNATY) 0.3 ML INJECTION
INTRAMUSCULAR | 0 refills | Status: DC
  Filled 2021-10-27: qty 0.3, 1d supply, fill #0

## 2021-10-27 NOTE — Progress Notes (Signed)
Patient ID: Kristin Mathews, female   DOB: 07-Jun-1970, 51 y.o.   MRN: [id number]  HPI: Kristin Mathews is a 51 y.o.-year-old female, presenting for f/u for DM2, dx 2009, insulin-dependent since 2009, uncontrolled, with complications (diabetic retinopathy). Last visit 8 months ago.  Interim history: No increased urination, blurry vision, nausea, chest pain. She was out of Ozempic for 6 weeks 2/2 miscommunication between pharmacy and our office >> gained 14 lbs and sugars were higher. Now started back 1.5 mo ago >> lost the weight back and sugars improved.  Reviewed HbA1c levels: 03/09/2021: HbA1c 7.3% Lab Results  Component Value Date   HGBA1C 7.4 (A) 09/02/2020   HGBA1C 6.9 (A) 04/08/2020   HGBA1C 7.0 (A) 10/26/2019   HGBA1C 7.4 (A) 06/29/2019   HGBA1C 8.7 (A) 03/30/2019   HGBA1C 8.4 (A) 04/22/2018   HGBA1C 7.9 (A) 10/07/2017   HGBA1C 9 01/31/2017   HGBA1C 8.3 (H) 08/21/2016   HGBA1C 7.6 01/23/2016   HGBA1C 9.0 09/26/2015   HGBA1C 8.5 (H) 02/22/2015   HGBA1C 8.2 08/03/2014   HGBA1C 8.6 (H) 04/30/2014   HGBA1C 9.3 (H) 11/02/2013   HGBA1C 7.1 (H) 04/14/2013   HGBA1C 8.0 (H) 11/12/2012   HGBA1C 7.3 (H) 05/13/2012   HGBA1C 6.8 (H) 08/21/2011   HGBA1C 6.5 11/15/2010   HGBA1C 7.3 (H) 07/04/2010   HGBA1C 8.1 (H) 02/17/2010   HGBA1C 7.3 (H) 10/13/2009   HGBA1C 7.4 (H) 06/07/2009   HGBA1C 8.4 (H) 01/18/2009   HGBA1C 6.6 (H) 06/29/2008   HGBA1C 6.6 (H) 02/25/2008   HGBA1C 8.3 (H) 08/29/2006   She continues frequent steroid courses for MS.  She is on: - Metformin ER 1000 mg 2x a day - Victoza 1.8 mg daily in a.m. >> Ozempic 1 >> 2 mg weekly - VGo 40 - 1 click for 00B >> 10 g carbs >> 5 clicks per L and 7-04 clicks per D >> [id number] 2-5 clicks before b'fast 5-6 clicks before lunch 6-94 clicks before dinner She was previously on Bydureon. She was on Invokana 100 >> 300 >> had few yeast infections - stopped in 05/2013 and tried again in 2017 >> stopped again b/c yeast inf. She tried  [place] but stopped because yeast infection 04/2018 We stopped Humulin 70/30 25 units bid. Januvia - did not help.  Glumetza not covered by insurance. She was previously on glipizide XL in the morning, stopped 11/2019.  She checks her sugars more than 4 times a day with her freestyle libre CGM:   Previously:   Previously:   Lowest CBG: 21!! (at night) ...>> LO (night) >>... 40s, 50s >> LO >> 40; she has hypoglycemia awareness in the 34s. Highest >300 >> .Marland KitchenMarland Kitchen200s >> 240.  Pt's meals are: - Breakfast: yoghurt + granola, cereals  - Lunch: sandwich, sushi, [place]  - Dinner: home cooked meal: stews, etc. - Snacks: 3-4   -No CKD, last BUN/creatinine:  Lab Results  Component Value Date   BUN 13 07/22/2020   CREATININE 0.93 07/22/2020  She developed angioedema from ACE inhibitors in the past.  -+ HL; last set of lipids: Lab Results  Component Value Date   CHOL 106 07/22/2020   HDL 35.90 (L) 07/22/2020   LDLCALC 39 07/22/2020   LDLDIRECT 125.0 08/21/2017   TRIG 153.0 (H) 07/22/2020   CHOLHDL 3 07/22/2020  On pravastatin added back in 08/2017 >> but feels hungry and "off" >> stopped.  Tried Zocor >> she did not like how she felt on it.   However,  she now tolerates well Lipitor 20 (decreased from 40 mg).  - last eye exam was on 03/02/2021: + DR reportedly  -+ Mild numbness and tingling in her feet.  Last foot exam 10/31/2020.  She also has a history of MS (neuro Dr Arlice Colt) - last 2 atacks: 02/2012, 2010; also HTN, fatty liver, endometriosis. She had a kidney stone in 11/2016 (calcium oxalate)- had litotripsy. She had a total of 20-25 kidney stones throughout her life. 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  She sees cardiology. She decreased Amlodipine 2/2 itching, leg swelling. She was started on a diuretic.  Beta-blocker has been increased summer 2022.  ROS: Neurological: no tremors/+ numbness/+ tingling/no dizziness  I  reviewed pt's medications, allergies, PMH, social hx, family hx, and changes were documented in the history of present illness. Otherwise, unchanged from my initial visit note.  Past Medical History:  Diagnosis Date   Arthritis    mild   Depression    Diabetes mellitus    Type 2   Endometriosis    GERD (gastroesophageal reflux disease)    Heart murmur    History of kidney stones    Hyperlipidemia    Hypertension    Multiple sclerosis ([place])    Type II or unspecified type diabetes mellitus with peripheral circulatory disorders, uncontrolled(250.72) 11/27/2012   Past Surgical History:  Procedure Laterality Date   EXPLORATORY LAPAROTOMY     endometriosis   EXTRACORPOREAL SHOCK WAVE LITHOTRIPSY Right 11/19/2016   Procedure: RIGHT EXTRACORPOREAL SHOCK WAVE LITHOTRIPSY (ESWL);  Surgeon: Festus Aloe, MD;  Location: [facility];  Service: Urology;  Laterality: Right;   LUMBAR DISC SURGERY     LS spine 2005 or so   WISDOM TOOTH EXTRACTION     Social History   Socioeconomic History   Marital status: Single    Spouse name: Not on file   Number of children: Not on file   Years of education: Not on file   Highest education level: Not on file  Occupational History   Not on file  Tobacco Use   Smoking status: Never   Smokeless tobacco: Never  Substance and Sexual Activity   Alcohol use: Yes    Alcohol/week: 0.0 standard drinks of alcohol    Comment: occasional   Drug use: No   Sexual activity: Not on file  Other Topics Concern   Not on file  Social History Narrative   Single. Lives alone. No children. Pet cat.       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      Regular exercise: not lately   Caffeine use: daily; during to the week      Hobbies: travel, genealogy , read, paper craft   Social Determinants of Radio broadcast assistant Strain: Not on file  Food Insecurity: Not on file  Transportation Needs: Not on file  Physical Activity: Not on file  Stress: Not on file   Social Connections: Not on file  Intimate Partner Violence: Not on file   Current Outpatient Medications on File Prior to Visit  Medication Sig Dispense Refill   amLODipine (NORVASC) 5 MG tablet TAKE 1 TABLET DAILY 180 tablet 3   atorvastatin (LIPITOR) 20 MG tablet TAKE 1 TABLET DAILY 90 tablet 3   azelastine (ASTELIN) 0.1 % nasal spray Place into both nostrils 2 (two) times daily. Use in each nostril as directed     carvedilol (COREG) 12.5 MG tablet TAKE 1 TABLET  TWICE A DAY 180 tablet 3   Continuous Blood Gluc Receiver (FREESTYLE LIBRE 14 DAY READER) DEVI 1 kit by Does not apply route See admin instructions. For continuous glucose monitoring 1 each 0   Continuous Blood Gluc Sensor (FREESTYLE LIBRE 14 DAY SENSOR) MISC CHANGE SENSOR EVERY 14 DAYS 6 each 0   DULoxetine (CYMBALTA) 60 MG capsule TAKE 1 CAPSULE DAILY (NEED APPOINTMENT) 180 capsule 0   fluticasone (FLONASE) 50 MCG/ACT nasal spray Place 2 sprays into both nostrils as needed.     HUMALOG 100 UNIT/ML injection INJECT 76 UNITS UNDER THE SKIN ONCE AS DIRECTED WITH VGO 40 (Patient taking differently: Inject 30 Units into the skin.) 70 mL 3   hydrochlorothiazide (HYDRODIURIL) 25 MG tablet TAKE 1 TABLET DAILY 90 tablet 3   Insulin Disposable Pump (V-GO 30) KIT use 1x a day 90 kit 3   insulin lispro (HUMALOG) 100 UNIT/ML injection INJECT 76 UNITS UNDER THE SKIN ONCE AS DIRECTED WITH VGO 40 70 mL 3   Insulin Pen Needle 32G X 4 MM MISC Use 1x a day 100 each 3   levocetirizine (XYZAL) 5 MG tablet Take 5 mg by mouth every evening.     levonorgestrel-ethinyl estradiol (LYBREL,AMETHYST) 90-20 MCG tablet Take 1 tablet by mouth daily.      Melatonin 5 MG CAPS Take by mouth.     metFORMIN (GLUCOPHAGE-XR) 500 MG 24 hr tablet TAKE 2 TABLETS (1,000 MG TOTAL) TWICE A DAY WITH A MEAL 360 tablet 3   modafinil (PROVIGIL) 200 MG tablet Take 1 tablet (200 mg total) by mouth daily as needed. 90 tablet 1   Naproxen Sodium 220 MG CAPS Take 440 mg by mouth 2  (two) times daily as needed (for pain.).      natalizumab (TYSABRI) 300 MG/15ML injection Inject 300 mg into the vein every 30 (thirty) days.     Probiotic Product (PROBIOTIC PO) Take by mouth.     Semaglutide, 2 MG/DOSE, 8 MG/3ML SOPN Inject 2 mg as directed once a week. 9 mL 3   No current facility-administered medications on file prior to visit.   Allergies  Allergen Reactions   Ace Inhibitors Swelling    ANGIOEDEMA   Family History  Problem Relation Age of Onset   Breast cancer Mother        early 80s   Diabetes Mother        maternal grandmother as well   Diabetes Father    Other Father        progressive supranuclear palsy   Colon cancer Neg Hx    Colon polyps Neg Hx    Esophageal cancer Neg Hx    Rectal cancer Neg Hx    Stomach cancer Neg Hx     PE: BP 120/82 (BP Location: Left Arm, Patient Position: Sitting, Cuff Size: Normal)   Pulse 82   Ht _0  (1.676 m)   Wt 229 lb (103.9 kg)   SpO2 98%   BMI 36.96 kg/m   Wt Readings from Last 3 Encounters:  10/27/21 229 lb (103.9 kg)  09/05/21 230 lb (104.3 kg)  03/09/21 233 lb (105.7 kg)   Constitutional: overweight, in NAD Eyes: EOMI, no exophthalmos ENT: no thyromegaly, no cervical lymphadenopathy Cardiovascular: RRR, No RG, +1/6 SEM Respiratory: CTA B Musculoskeletal: no deformities Skin: no rashes Neurological: no tremor with outstretched hands Diabetic Foot Exam - Simple   Simple Foot Form Diabetic Foot exam was performed with the following findings: Yes 10/27/2021 10:40 AM  Visual  Inspection No deformities, no ulcerations, no other skin breakdown bilaterally: Yes Sensation Testing Intact to touch and monofilament testing bilaterally: Yes Pulse Check Posterior Tibialis and Dorsalis pulse intact bilaterally: Yes Comments Dry skin     ASSESSMENT: 1. DM2, insulin-dependent, uncontrolled, with complications: - DR  She saw the diabetes educator for a discussion about insulin pumps >> decided for a  VGo  2. Obesity class 2 BMI Classification: < 18.5 underweight  18.5-24.9 normal weight  25.0-29.9 overweight  30.0-34.9 class I obesity  35.0-39.9 class II obesity  ? 40.0 class III obesity   3. HL  PLAN:  1. Patient with history of uncontrolled type 2 diabetes, exacerbated by steroid courses for MS and also extensive traveling for work.  Her sugars improved last year to an HbA1c of 6.9%, but afterwards her control deteriorated in the last visit HbA1c was 7.3%.  At that time, she had loss of data on her CGM glucose profile.  However, it was obvious that the sugars were variable overnight, with some of them being low in the normal range and decreasing further around 10 AM, under 70.  Later in the day sugars were increasing, especially after dinner.  She was back on the V-Go 40 at that time due to lack of supplies of the V-Go 30.  We discussed about going back to the lower dose pump.  I did advise her to try to increase Ozempic further, to 2 mg weekly for better mealtime coverage. CGM interpretation: -At today's visit, we reviewed her CGM downloads: It appears that 79% of values are in target range (goal >70%), while 13% are higher than 180 (goal <25%), and 8% are lower than 70 (goal <4%).  The calculated average blood sugar is 133.  The projected HbA1c for the next 3 months (GMI) is <7% -Reviewing the CGM trends, the sugars appear to be fluctuating mostly within the target range, but we still have lack of data later in the afternoon and overnight.  I again advised her to scan the device every 8 hours, especially when she goes to bed.  She has not higher blood sugars after meals occasionally, but not significantly so, and I do not feel that this is a consistent trend for any of the meals. She also has occasional low blood sugars, but this is also not consistent and may happen when she is more active.  I advised her to reduce the dose of insulin (lower number of clicks) before a meal after which she  plans to be active).  Otherwise, we can continue the same regimen for now. -  I advised her to:  Patient Instructions  Please continue: - Metformin ER 1000 mg 2x a day - Ozempic 2 mg weekly - VGo 30 with Humalog 15 min before meals: 3-4 clicks before b'fast 5-6 clicks before lunch 1-61 clicks before dinner  Please return in 4 months.  - we checked her HbA1c: 8.0% (higher) - advised to check sugars at different times of the day - 4x a day, rotating check times - advised for yearly eye exams >> she is UTD - will check annual labs today - return to clinic in 4 months  2. Obesity class 2 -We will continue Ozempic which should also help with weight loss.  Dose was increased at last visit. -Weight was stable at last visit.  Unfortunately, she was out of Ozempic so she gained 15 pounds back but she was able to lose them.  At this visit, she is -4  pounds compared to last visit.  3. HL -Latest lipid panel from 07/2020: LDL much improved, at goal, HDL slightly low: Lab Results  Component Value Date   CHOL 106 07/22/2020   HDL 35.90 (L) 07/22/2020   LDLCALC 39 07/22/2020   LDLDIRECT 125.0 08/21/2017   TRIG 153.0 (H) 07/22/2020   CHOLHDL 3 07/22/2020  -She tried pravastatin and simvastatin but she did not like how this made her feel.  However, she is now on Lipitor 20 mg daily, tolerated well.  Component     Latest Ref Rng 10/27/2021  Sodium     135 - 145 mEq/L 139   Potassium     3.5 - 5.1 mEq/L 3.5   Chloride     96 - 112 mEq/L 102   CO2     19 - 32 mEq/L 26   Glucose     70 - 99 mg/dL 128 (H)   BUN     6 - 23 mg/dL 15   Creatinine     0.40 - 1.20 mg/dL 0.89   Calcium     8.4 - 10.5 mg/dL 9.2   Total Bilirubin     0.2 - 1.2 mg/dL 0.3   Alkaline Phosphatase     39 - 117 U/L 76   AST     0 - 37 U/L 33   ALT     0 - 35 U/L 31   Total Protein     6.0 - 8.3 g/dL 6.5   Albumin     3.5 - 5.2 g/dL 3.7   GFR     >60.00 mL/min 74.63   Cholesterol     0 - 200 mg/dL 101    Triglycerides     0.0 - 149.0 mg/dL 155.0 (H)   HDL Cholesterol     >39.00 mg/dL 32.30 (L)   VLDL     0.0 - 40.0 mg/dL 31.0   Total CHOL/HDL Ratio 3   NonHDL 68.45   Microalb, Ur     0.0 - 1.9 mg/dL 1.7   Creatinine,U     mg/dL 196.8   MICROALB/CREAT RATIO     0.0 - 30.0 mg/g 0.8   LDL (calc)     0 - 99 mg/dL 37     HDL slightly low, otherwise labs at goal.  [name], MD PhD [facility]

## 2021-10-27 NOTE — Patient Instructions (Signed)
Patient Instructions  Please continue: - Metformin ER 1000 mg 2x a day - Ozempic 2 mg weekly - VGo 30 with Humalog 15 min before meals: 3-4 clicks before b'fast 5-6 clicks before lunch 4-70 clicks before dinner  Please return in 4 months.

## 2021-12-05 ENCOUNTER — Ambulatory Visit
Admission: RE | Admit: 2021-12-05 | Discharge: 2021-12-05 | Disposition: A | Discharge status: Home / Self Care | Payer: BC Managed Care – PPO | Source: Ambulatory Visit | Attending: Obstetrics and Gynecology | Admitting: Obstetrics and Gynecology

## 2021-12-05 DIAGNOSIS — Z9189 Other specified personal risk factors, not elsewhere classified: Secondary | ICD-10-CM

## 2021-12-05 DIAGNOSIS — Z1239 Encounter for other screening for malignant neoplasm of breast: Secondary | ICD-10-CM | POA: Diagnosis not present

## 2021-12-05 MED ORDER — GADOPICLENOL 0.5 MMOL/ML IV SOLN
10.0000 mL | Freq: Once | INTRAVENOUS | Status: AC | PRN
  Administered 2021-12-05: 10 mL via INTRAVENOUS

## 2021-12-12 ENCOUNTER — Other Ambulatory Visit: Payer: BC Managed Care – PPO

## 2021-12-12 DIAGNOSIS — G35 Multiple sclerosis: Secondary | ICD-10-CM | POA: Diagnosis not present

## 2021-12-12 DIAGNOSIS — Z79899 Other long term (current) drug therapy: Secondary | ICD-10-CM | POA: Diagnosis not present

## 2021-12-13 LAB — CBC WITH DIFFERENTIAL/PLATELET
Basophils Absolute: 0.1 10*3/uL (ref 0.0–0.2)
Basos: 1 %
EOS (ABSOLUTE): 0.5 10*3/uL — ABNORMAL HIGH (ref 0.0–0.4)
Eos: 5 %
Hematocrit: 40.8 % (ref 34.0–46.6)
Hemoglobin: 13 g/dL (ref 11.1–15.9)
Immature Grans (Abs): 0 10*3/uL (ref 0.0–0.1)
Immature Granulocytes: 0 %
Lymphocytes Absolute: 3.4 10*3/uL — ABNORMAL HIGH (ref 0.7–3.1)
Lymphs: 34 %
MCH: 27.7 pg (ref 26.6–33.0)
MCHC: 31.9 g/dL (ref 31.5–35.7)
MCV: 87 fL (ref 79–97)
Monocytes Absolute: 0.5 10*3/uL (ref 0.1–0.9)
Monocytes: 5 %
Neutrophils Absolute: 5.7 10*3/uL (ref 1.4–7.0)
Neutrophils: 55 %
Platelets: 408 10*3/uL (ref 150–450)
RBC: 4.7 x10E6/uL (ref 3.77–5.28)
RDW: 13.8 % (ref 11.7–15.4)
WBC: 10.2 10*3/uL (ref 3.4–10.8)

## 2021-12-28 ENCOUNTER — Encounter: Payer: Self-pay | Admitting: *Deleted

## 2022-01-05 ENCOUNTER — Other Ambulatory Visit: Payer: Self-pay | Admitting: Family Medicine

## 2022-01-25 DIAGNOSIS — G35 Multiple sclerosis: Secondary | ICD-10-CM | POA: Diagnosis not present

## 2022-02-13 ENCOUNTER — Telehealth: Payer: Self-pay | Admitting: Neurology

## 2022-02-13 MED ORDER — MODAFINIL 200 MG PO TABS: 200.0000 mg | ORAL_TABLET | Freq: Every day | ORAL | 1 refills | Status: DC | PRN

## 2022-02-13 NOTE — Telephone Encounter (Signed)
Last seen 09/05/21 and next f/u 03/12/22. Per drug registry, last refilled 08/01/21 #[id number]

## 2022-02-13 NOTE — Telephone Encounter (Signed)
Pt is calling. Stated she needs a new prescription for modafinil (PROVIGIL) 200 MG tablet  sent to [facility]

## 2022-02-19 ENCOUNTER — Encounter: Payer: Self-pay | Admitting: Neurology

## 2022-02-22 ENCOUNTER — Other Ambulatory Visit (HOSPITAL_COMMUNITY): Payer: Self-pay

## 2022-02-22 NOTE — Telephone Encounter (Signed)
Called [facility] at [phone]. Spoke w/ Janett Billow. Started PA modafinil over the phone with her.  ICD10: R53.83, G35. (MS related fatigue)PA approved. Case: [id number] effective 01/23/22-02/22/23.

## 2022-02-27 ENCOUNTER — Ambulatory Visit: Payer: BC Managed Care – PPO | Admitting: Internal Medicine

## 2022-03-08 NOTE — Progress Notes (Signed)
PATIENT: Kristin Mathews DOB: [birth year]  REASON FOR VISIT: follow up HISTORY FROM: patient  Virtual Visit via Telephone Note  I connected with Kristin Mathews on 03/12/22 at  1:30 PM EST by telephone and verified that I am speaking with the correct person using two identifiers.   I discussed the limitations, risks, security and privacy concerns of performing an evaluation and management service by telephone and the availability of in person appointments. I also discussed with the patient that there may be a patient responsible charge related to this service. The patient expressed understanding and agreed to proceed.   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 She continues q6wk infusions due to low positive JCV in 05/2019. Most recent JCV negative (0.27) on 10/2021. CBC stable. Last MRI stable in 12/2020. Next infusion is tomorrow.   She is doing well, today. No new or worsening symptoms. She continues to work full time. She is sleeping fairly well. She is taking melatonin that helps. She also takes modafinil as needed for EDS. She feels it helps. She was unable to get rx for about a week and could tell a difference. She does not usually take on weekends.   She has started a new job. 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 She admits that CBG's have been okay. Last A1C was 8. She needs to schedule follow up with endo. She is followed closely by cardiology for aortic aneurysm.   No changes in bladder habits. She is having some bowel incontinence that is chronic. Colonoscopy was normal. She was treated for hemorrhoids. She is planning to schedule follow up.   No vision changes.   History (copied from Dr Kristin Mathews previous note)  Kristin Mathews is a 52 y.o. woman with relapsing remitting MS dx 2001.   Update 09/05/2021: Her MS continues to be stable on Tysabri.  She tolerates it well and has not had any  exacerbations.  She was JCV antibody negative when last checked 07/31/2021  and was negativeat 0.29. Marland Kitchen   She had one low positive readng.   Her last brain MRI 01/20/2018 showed an overall small plaque burden.  We have no recent MRI of the cervical spine.   Gait is ok on a flat surface but balance is off and she uses the banister to go up and down stairs. She has had a couple falls either because of reduced balance or foot drop.  Strength and sensation is usually ok except mils foot tingling (also has IDDM T2).     She denies any difficulty with her bladder.  Vision function is ok but she has had diabetic retinopathy that is being followed closely.     She notes notes fatigue and does better after nap.    She is sleeping well most nights (6-8 hours during the work week and 12-15 hours on weekends).   Modafinil helps some.  Due to HTN prefers not to try a stimulant.   She has had a PSG in the past and she had no OSA.     She denies any difficulties with cognition.  Her mood is fine.   She had her first Singrix vaccination in June concurrent with her covid booster.   She felt weaker x 48 hours and had bladder control issues.      Her job has changed and she will have less travel (she enjoyed that aspect  of the job.    MS History: She presented with unilateral numbness in May 2001 and was diagnosed with transverse myelitis.    MRI only showed one spot and an LP was non-diagnostic.   She had another episode of numbness 6 months later and MRI showed several newer lesions.    She was placed on REbif as part of the Rebiject study.     She had an exacerbation in 2007 and in 2010 with numbness and then optic neuritis.   In 2015, she had another numbness exacerbation and we decided to switch to Tysabri.    She tolerates Tysabri well.     Imaging: MRI of the brain 01/20/2018 showed T2/flair hyperintense foci in the periventricular, juxtacortical and deep white matter.  These are consistent with chronic demyelinating  plaque associated with multiple sclerosis though some of the foci are nonspecific.   Observations/Objective:  Generalized: Well developed, in no acute distress  Mentation: Alert oriented to time, place, history taking. Follows all commands speech and language fluent   Assessment and Plan:  52 y.o. year old female  has a past medical history of Arthritis, Depression, Diabetes mellitus, Endometriosis, GERD (gastroesophageal reflux disease), Heart murmur, History of kidney stones, Hyperlipidemia, Hypertension, Multiple sclerosis ([place]), and Type II or unspecified type diabetes mellitus with peripheral circulatory disorders, uncontrolled(250.72) (11/27/2012). here with    ICD-10-CM   1. Relapsing remitting multiple sclerosis ([place])  G35     2. High risk medication use  Z79.899     3. Gait disturbance  R26.9     4. Excessive sleepiness  G47.10       Kristin Mathews is doing well. We will continue Tysabri infusions every 6 weeks. Labs reviewed in Epic. She will continue modafinil '200mg'$  daily as needed. Healthy lifestyle habits encouraged. She will follow up with Dr Kristin Mathews in 6 months.   No orders of the defined types were placed in this encounter.   No orders of the defined types were placed in this encounter.    Follow Up Instructions:  I discussed the assessment and treatment plan with the patient. The patient was provided an opportunity to ask questions and all were answered. The patient agreed with the plan and demonstrated an understanding of the instructions.   The patient was advised to call back or seek an in-person evaluation if the symptoms worsen or if the condition fails to improve as anticipated.  I provided 20 minutes of non-face-to-face time during this encounter. Patient located at their place of residence during [place] visit. Provider is in the office.    Kristin Presto, NP

## 2022-03-08 NOTE — Patient Instructions (Signed)
Below is our plan:  We will continue current treatment plan. Let us know when you have new insurance info and we will work on getting Tysabri covered under new plan. We can discuss change in DMT if needed.   Please make sure you are staying well hydrated. I recommend 50-60 ounces daily. Well balanced diet and regular exercise encouraged. Consistent sleep schedule with 6-8 hours recommended.   Please continue follow up with care team as directed.   Follow up with Dr Felecia Shelling in 6 months   You may receive a survey regarding today's visit. I encourage you to leave honest feed back as I do use this information to improve patient care. Thank you for seeing me today!

## 2022-03-12 ENCOUNTER — Telehealth: Payer: BC Managed Care – PPO | Admitting: Family Medicine

## 2022-03-12 ENCOUNTER — Other Ambulatory Visit: Payer: Self-pay | Admitting: Internal Medicine

## 2022-03-12 ENCOUNTER — Encounter: Payer: Self-pay | Admitting: Family Medicine

## 2022-03-12 DIAGNOSIS — G4710 Hypersomnia, unspecified: Secondary | ICD-10-CM

## 2022-03-12 DIAGNOSIS — R269 Unspecified abnormalities of gait and mobility: Secondary | ICD-10-CM

## 2022-03-12 DIAGNOSIS — Z79899 Other long term (current) drug therapy: Secondary | ICD-10-CM | POA: Diagnosis not present

## 2022-03-12 DIAGNOSIS — G35 Multiple sclerosis: Secondary | ICD-10-CM | POA: Diagnosis not present

## 2022-03-13 ENCOUNTER — Telehealth: Payer: Self-pay

## 2022-03-13 ENCOUNTER — Other Ambulatory Visit: Payer: Self-pay | Admitting: *Deleted

## 2022-03-13 ENCOUNTER — Other Ambulatory Visit: Payer: Self-pay

## 2022-03-13 DIAGNOSIS — Z79899 Other long term (current) drug therapy: Secondary | ICD-10-CM | POA: Diagnosis not present

## 2022-03-13 DIAGNOSIS — G35 Multiple sclerosis: Secondary | ICD-10-CM | POA: Diagnosis not present

## 2022-03-13 NOTE — Telephone Encounter (Signed)
JCV Pending 03/13/2022

## 2022-03-14 LAB — CBC WITH DIFFERENTIAL/PLATELET
Basophils Absolute: 0.1 10*3/uL (ref 0.0–0.2)
Basos: 1 %
EOS (ABSOLUTE): 0.5 10*3/uL — ABNORMAL HIGH (ref 0.0–0.4)
Eos: 5 %
Hematocrit: 40.9 % (ref 34.0–46.6)
Hemoglobin: 13.4 g/dL (ref 11.1–15.9)
Immature Grans (Abs): 0.1 10*3/uL (ref 0.0–0.1)
Immature Granulocytes: 1 %
Lymphocytes Absolute: 3.9 10*3/uL — ABNORMAL HIGH (ref 0.7–3.1)
Lymphs: 37 %
MCH: 28.2 pg (ref 26.6–33.0)
MCHC: 32.8 g/dL (ref 31.5–35.7)
MCV: 86 fL (ref 79–97)
Monocytes Absolute: 0.5 10*3/uL (ref 0.1–0.9)
Monocytes: 5 %
Neutrophils Absolute: 5.5 10*3/uL (ref 1.4–7.0)
Neutrophils: 51 %
Platelets: 424 10*3/uL (ref 150–450)
RBC: 4.76 x10E6/uL (ref 3.77–5.28)
RDW: 14.4 % (ref 11.7–15.4)
WBC: 10.6 10*3/uL (ref 3.4–10.8)

## 2022-03-20 NOTE — Telephone Encounter (Signed)
JCV ab drawn on 03/13/22 indeterminate, index: 0.22. Inhibition assay: negative.

## 2022-03-23 ENCOUNTER — Encounter: Payer: Self-pay | Admitting: Family Medicine

## 2022-03-23 ENCOUNTER — Ambulatory Visit (INDEPENDENT_AMBULATORY_CARE_PROVIDER_SITE_OTHER): Payer: 59 | Admitting: Family Medicine

## 2022-03-23 VITALS — BP 130/70 | HR 71 | Temp 97.0°F | Ht 66.0 in | Wt 227.4 lb

## 2022-03-23 DIAGNOSIS — I10 Essential (primary) hypertension: Secondary | ICD-10-CM | POA: Diagnosis not present

## 2022-03-23 DIAGNOSIS — D849 Immunodeficiency, unspecified: Secondary | ICD-10-CM

## 2022-03-23 DIAGNOSIS — G35 Multiple sclerosis: Secondary | ICD-10-CM | POA: Diagnosis not present

## 2022-03-23 DIAGNOSIS — F325 Major depressive disorder, single episode, in full remission: Secondary | ICD-10-CM | POA: Diagnosis not present

## 2022-03-23 DIAGNOSIS — E785 Hyperlipidemia, unspecified: Secondary | ICD-10-CM

## 2022-03-23 MED ORDER — CARVEDILOL 12.5 MG PO TABS: 12.5000 mg | ORAL_TABLET | Freq: Two times a day (BID) | ORAL | 3 refills | Status: DC

## 2022-03-23 MED ORDER — AMLODIPINE BESYLATE 5 MG PO TABS: 5.0000 mg | ORAL_TABLET | Freq: Every day | ORAL | 3 refills | Status: DC

## 2022-03-23 MED ORDER — DULOXETINE HCL 60 MG PO CPEP: ORAL_CAPSULE | ORAL | 0 refills | Status: DC

## 2022-03-23 MED ORDER — DULOXETINE HCL 60 MG PO CPEP: ORAL_CAPSULE | ORAL | 3 refills | Status: DC

## 2022-03-23 MED ORDER — HYDROCORTISONE ACETATE 25 MG RE SUPP: 25.0000 mg | Freq: Two times a day (BID) | RECTAL | 0 refills | Status: DC

## 2022-03-23 MED ORDER — HYDROCHLOROTHIAZIDE 25 MG PO TABS: 25.0000 mg | ORAL_TABLET | Freq: Every day | ORAL | 3 refills | Status: DC

## 2022-03-23 MED ORDER — ATORVASTATIN CALCIUM 20 MG PO TABS: 20.0000 mg | ORAL_TABLET | Freq: Every day | ORAL | 3 refills | Status: DC

## 2022-03-23 NOTE — Assessment & Plan Note (Signed)
This was an overread- follow up imaging has shown no aneurysm- will remove from problem list

## 2022-03-23 NOTE — Patient Instructions (Addendum)
Get Diabetic eye exam scheduled.  Sign release of information at the check out desk for last pap smear   Recommended follow up: Return for next already scheduled visit or sooner if needed.

## 2022-03-23 NOTE — Progress Notes (Signed)
Phone [phone] In person visit   Subjective:   Kristin Mathews is a 52 y.o. year old very pleasant female patient who presents for/with See problem oriented charting Chief Complaint  Patient presents with   Medication Refill   Past Medical History-  Patient Active Problem List   Diagnosis Date Noted   Pulmonary nodule 08/21/2017    Priority: High   Immunosuppressed status ([place]) 03/05/2017    Priority: High   Diabetes mellitus type II, controlled ([place]) 11/27/2012    Priority: High   Relapsing remitting multiple sclerosis ([place]) 07/05/2006    Priority: High   Nephrolithiasis 08/21/2016    Priority: Medium    Depression 02/25/2008    Priority: Medium    Hyperlipidemia 08/29/2006    Priority: Medium    Essential hypertension 07/05/2006    Priority: Medium    Gait disturbance 04/17/2016    Priority: Low   Tingling 04/17/2016    Priority: Low   Snoring 04/17/2016    Priority: Low   Urinary urgency 04/17/2016    Priority: Low   Excessive sleepiness 04/17/2016    Priority: Low   Allergic rhinitis 04/27/2014    Priority: Low   Low back pain 04/27/2014    Priority: Low   Endometriosis 07/05/2006    Priority: Low   Major depressive disorder with single episode, in full remission ([place]) 07/22/2020   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 Use in each nostril as directed     Continuous Blood Gluc Receiver (FREESTYLE LIBRE 2 READER) DEVI 1 each by Does not apply route daily. 1 each 3   Continuous Blood Gluc Sensor (FREESTYLE LIBRE 2 SENSOR) MISC 1 each by Does not apply route every 14 (fourteen) days. 6 each 3   COVID-19 mRNA vaccine 2023-2024 (COMIRNATY) SUSP injection Inject 0.3 ml into the  muscle. 0.3 mL 0   fluticasone (FLONASE) 50 MCG/ACT nasal spray Place 2 sprays into both nostrils as needed.     HUMALOG 100 UNIT/ML injection INJECT 76 UNITS UNDER THE SKIN ONCE AS DIRECTED WITH VGO 40 (Patient taking differently: Inject 30 Units into the skin.) 70 mL 3   hydrocortisone (ANUSOL-HC) 25 MG suppository Place 1 suppository (25 mg total) rectally 2 (two) times daily. 12 suppository 0   Insulin Disposable Pump (V-GO 30) KIT use 1x a day 90 kit 3   Insulin Pen Needle 32G X 4 MM MISC Use 1x a day 100 each 3   levocetirizine (XYZAL) 5 MG tablet Take 5 mg by mouth every evening.     levonorgestrel-ethinyl estradiol (LYBREL,AMETHYST) 90-20 MCG tablet Take 1 tablet by mouth daily.      Melatonin 5 MG CAPS Take by mouth.     metFORMIN (GLUCOPHAGE-XR) 500 MG 24 hr tablet TAKE 2 TABLETS (1,000 MG TOTAL) TWICE A DAY WITH A MEAL 360 tablet 3   modafinil (PROVIGIL) 200 MG tablet Take 1 tablet (200 mg total) by mouth daily as needed. 90 tablet 1   Naproxen Sodium 220 MG CAPS Take 440 mg by mouth 2 (two) times daily as needed (for pain.).      natalizumab (TYSABRI) 300 MG/15ML injection Inject 300 mg into the vein every 30 (thirty) days.     Probiotic Product (PROBIOTIC PO)  Take by mouth.     Semaglutide, 2 MG/DOSE, (OZEMPIC, 2 MG/DOSE,) 8 MG/3ML SOPN INJECT 2 MG ONCE A WEEK AS DIRECTED 9 mL 0   amLODipine (NORVASC) 5 MG tablet Take 1 tablet (5 mg total) by mouth daily. 90 tablet 3   atorvastatin (LIPITOR) 20 MG tablet Take 1 tablet (20 mg total) by mouth daily. 90 tablet 3   carvedilol (COREG) 12.5 MG tablet Take 1 tablet (12.5 mg total) by mouth 2 (two) times daily. 180 tablet 3   DULoxetine (CYMBALTA) 60 MG capsule TAKE 1 CAPSULE DAILY 90 capsule 3   hydrochlorothiazide (HYDRODIURIL) 25 MG tablet Take 1 tablet (25 mg total) by mouth daily. 90 tablet 3   No current facility-administered medications for this visit.     Objective:  BP 130/70   Pulse 71   Temp (!) 97 F (36.1 C)   Ht '5\' 6"'$   (1.676 m)   Wt 227 lb 6.4 oz (103.1 kg)   SpO2 99%   BMI 36.70 kg/m  Gen: NAD, resting comfortably CV: RRR no murmurs rubs or gallops Lungs: CTAB no crackles, wheeze, rhonchi Ext: trace edema Skin: warm, dry     Assessment and Plan   # Multiple sclerosis-follows with Dr. Felecia Shelling S:Medication: Fritzi Mandes. No exacerbation in 10 years. Monitoring JCV- 6 weeks instead of 4 weeks  A/P: stable- continue current medicines    # Diabetes- sees Dr. Cruzita Lederer S: Medication:Uses V-Go pump disposable, metformin 500 mg-takes 2 tablets twice daily, Ozempic 2 mg (tolerating for most part other than day 1) CBGs- no recent improvement Exercise and diet- struggling with MS- plans to start small- may try some indoor walking or yoga videos she has found. Her food base is pretty healthy but also has some unhealthy choices- has noted pickles increase her sugar- wants to reduce. Wants to begin shopping on full stomach on less stressful days- feels would help Lab Results  Component Value Date   HGBA1C 8.0 (A) 10/27/2021   HGBA1C 7.4 (A) 09/02/2020   HGBA1C 6.9 (A) 04/08/2020  A/P: mild poor control- discussed dietary strategies . Continue current medications and endo follow up    #hypertension S: medication: Amlodipine 5 mg, carvedilol 12.5 mg twice daily, hydrochlorothiazide 25 mg Home readings #s: no recent checks BP Readings from Last 3 Encounters:  03/23/22 130/70  10/27/21 120/82  09/05/21 (!) 138/98  A/P: stable- continue current medicines  -she tends to run more 130s/80s and we considered bumping up carvedilol- she can eithe rmessage me with home readings for a week or bring those to next visit to discuss  #hyperlipidemia S: Medication: Atorvastatin 20 mg Lab Results  Component Value Date   CHOL 101 10/27/2021   HDL 32.30 (L) 10/27/2021   LDLCALC 37 10/27/2021   LDLDIRECT 125.0 08/21/2017   TRIG 155.0 (H) 10/27/2021   CHOLHDL 3 10/27/2021  A/P: LDL well controlled at 37 in October- continue  current medications    # Depression S: Medication:Cymbalta 60 mg    03/23/2022   11:25 AM 10/31/2020    4:05 PM 07/22/2020    1:04 PM  Depression screen PHQ 2/9  Decreased Interest 0 0 0  Down, Depressed, Hopeless 0 0 2  PHQ - 2 Score 0 0 2  Altered sleeping 1 0 1  Tired, decreased energy '2 3 3  '$ Change in appetite 1 1 0  Feeling bad or failure about yourself  0 0 1  Trouble concentrating 0 0 0  Moving slowly or fidgety/restless 0 0  0  Suicidal thoughts 0 0 0  PHQ-9 Score '4 4 7  '$ Difficult doing work/chores Somewhat difficult Not difficult at all Somewhat difficult  A/P: reasonable control even with major life changes- continue current medications would consider full remission.    # Concern for aortic aneurysm and pulmonary nodule-on follow-up imaging no aneurysm was noted and nodules were unchanged on 01/04/2021-they recommended follow-up CT chest in 18 to 24 months - wants to hol don scan for now and discuss next visit   # History of internal hemorrhoids-patient with history of internal hemorrhoids years ago and was treated with suppositories with some improvement in baseline fecal urgency and bowel incontinence-this has been a chronic issue and occurred about 15 years ago-has had gradual worsening over time but not specifically in the last year since colonoscopy when no hernias were noted-she would like to retrial hydrocortisone suppositories-we discussed there may not be a direct benefit unless she has developed hemorrhoids but she would like to trial this to see given prior improvement    Recommended follow up: Return for next already scheduled visit or sooner if needed. Future Appointments  Date Time Provider [place]  04/23/2022  4:00 PM [name], MD [facility] None  07/11/2022  9:00 AM Marin Olp, MD [facility]  09/11/2022  4:00 PM Sater, Nanine Means, MD [facility] None   Lab/Order associations:   ICD-10-CM   1. Immunosuppressed status ([place])  D84.9     2.  Relapsing remitting multiple sclerosis ([place])  G35     3. Essential hypertension  I10     4. Major depressive disorder with single episode, in full remission ([place])  F32.5     5. Hyperlipidemia, unspecified hyperlipidemia type  E78.5        Meds ordered this encounter  Medications   amLODipine (NORVASC) 5 MG tablet    Sig: Take 1 tablet (5 mg total) by mouth daily.    Dispense:  90 tablet    Refill:  3   atorvastatin (LIPITOR) 20 MG tablet    Sig: Take 1 tablet (20 mg total) by mouth daily.    Dispense:  90 tablet    Refill:  3   hydrochlorothiazide (HYDRODIURIL) 25 MG tablet    Sig: Take 1 tablet (25 mg total) by mouth daily.    Dispense:  90 tablet    Refill:  3   carvedilol (COREG) 12.5 MG tablet    Sig: Take 1 tablet (12.5 mg total) by mouth 2 (two) times daily.    Dispense:  180 tablet    Refill:  3   DISCONTD: DULoxetine (CYMBALTA) 60 MG capsule    Sig: TAKE 1 CAPSULE DAILY . Short term fill for mail order to get there.    Dispense:  30 capsule    Refill:  0   DULoxetine (CYMBALTA) 60 MG capsule    Sig: TAKE 1 CAPSULE DAILY    Dispense:  90 capsule    Refill:  3   hydrocortisone (ANUSOL-HC) 25 MG suppository    Sig: Place 1 suppository (25 mg total) rectally 2 (two) times daily.    Dispense:  12 suppository    Refill:  0    Return precautions advised.  Garret Reddish, MD

## 2022-04-16 ENCOUNTER — Other Ambulatory Visit: Payer: Self-pay | Admitting: Internal Medicine

## 2022-04-23 ENCOUNTER — Telehealth: Payer: Self-pay

## 2022-04-23 ENCOUNTER — Encounter: Payer: Self-pay | Admitting: Internal Medicine

## 2022-04-23 ENCOUNTER — Ambulatory Visit: Payer: 59 | Admitting: Internal Medicine

## 2022-04-23 VITALS — BP 132/88 | HR 92 | Ht 66.0 in | Wt 225.2 lb

## 2022-04-23 DIAGNOSIS — E669 Obesity, unspecified: Secondary | ICD-10-CM | POA: Diagnosis not present

## 2022-04-23 DIAGNOSIS — E1159 Type 2 diabetes mellitus with other circulatory complications: Secondary | ICD-10-CM

## 2022-04-23 DIAGNOSIS — E1165 Type 2 diabetes mellitus with hyperglycemia: Secondary | ICD-10-CM | POA: Diagnosis not present

## 2022-04-23 DIAGNOSIS — E785 Hyperlipidemia, unspecified: Secondary | ICD-10-CM

## 2022-04-23 LAB — POCT GLYCOSYLATED HEMOGLOBIN (HGB A1C): Hemoglobin A1C: 7.2 % — AB (ref 4.0–5.6)

## 2022-04-23 MED ORDER — OZEMPIC (2 MG/DOSE) 8 MG/3ML ~~LOC~~ SOPN: PEN_INJECTOR | SUBCUTANEOUS | 3 refills | Status: DC

## 2022-04-23 MED ORDER — METFORMIN HCL ER 500 MG PO TB24: ORAL_TABLET | ORAL | 3 refills | Status: DC

## 2022-04-23 NOTE — Patient Instructions (Addendum)
Please continue: - Metformin ER 1000 mg 2x a day - Ozempic 2 mg weekly - VGo 30 with Humalog 15 min before meals: 3-4 clicks before b'fast 5-6 clicks before lunch 9-11 clicks before dinner  Please return in 4-6 months.

## 2022-04-23 NOTE — Telephone Encounter (Signed)
Can we get an expedited PA for the V-Go 30.

## 2022-04-23 NOTE — Progress Notes (Signed)
Patient ID: Kristin Mathews, female   DOB: 1970/10/24, 52 y.o.   MRN: [id number]  HPI: Kristin Mathews is a 52 y.o.-year-old female, presenting for f/u for DM2, dx 2009, insulin-dependent since 2009, uncontrolled, with complications (diabetic retinopathy). Last visit 6 months ago. Since last visit, she changed her job and also her insurance >> now [employer].  Her co-pays are higher.  Interim history: No increased urination, blurry vision, nausea, chest pain. VGo is not covered anymore under the new insurance.  Reviewed HbA1c levels: Lab Results  Component Value Date   HGBA1C 8.0 (A) 10/27/2021   HGBA1C 7.4 (A) 09/02/2020   HGBA1C 6.9 (A) 04/08/2020   HGBA1C 7.0 (A) 10/26/2019   HGBA1C 7.4 (A) 06/29/2019   HGBA1C 8.7 (A) 03/30/2019   HGBA1C 8.4 (A) 04/22/2018   HGBA1C 7.9 (A) 10/07/2017   HGBA1C 9 01/31/2017   HGBA1C 8.3 (H) 08/21/2016   HGBA1C 7.6 01/23/2016   HGBA1C 9.0 09/26/2015   HGBA1C 8.5 (H) 02/22/2015   HGBA1C 8.2 08/03/2014   HGBA1C 8.6 (H) 04/30/2014   HGBA1C 9.3 (H) 11/02/2013   HGBA1C 7.1 (H) 04/14/2013   HGBA1C 8.0 (H) 11/12/2012   HGBA1C 7.3 (H) 05/13/2012   HGBA1C 6.8 (H) 08/21/2011   HGBA1C 6.5 11/15/2010   HGBA1C 7.3 (H) 07/04/2010   HGBA1C 8.1 (H) 02/17/2010   HGBA1C 7.3 (H) 10/13/2009   HGBA1C 7.4 (H) 06/07/2009   HGBA1C 8.4 (H) 01/18/2009   HGBA1C 6.6 (H) 06/29/2008   HGBA1C 6.6 (H) 02/25/2008   HGBA1C 8.3 (H) 08/29/2006  03/09/2021: HbA1c 7.3%  She prev. Had frequent steroid courses for MS. Not recently.  She is on: - Metformin ER 1000 mg 2x a day - Victoza 1.8 mg daily in a.m. >> Ozempic 1 >> 2 mg weekly - VGo 40 - 1 click for 15g >> 10 g carbs >> 5 clicks per L and 8-10 clicks per D >> VGo30 3-4 clicks before b'fast 5-6 clicks before lunch 9-11 clicks before dinner She was on Invokana 100 >> 300 >> had few yeast infections - stopped in 05/2013 and tried again in 2017 >> stopped again b/c yeast inf. She tried [place] but stopped because yeast infection  04/2018 We stopped Humulin 70/30 25 units bid. Januvia - did not help.  Glumetza not covered by insurance. She was previously on glipizide XL in the morning, stopped 11/2019.  She checks her sugars more than 4 times a day with her freestyle libre CGM:  Previously:   Previously:   Lowest CBG: 21!! (at night) ...>> LO >> 40; she has hypoglycemia awareness in the 89s. Highest >300 >> .Marland KitchenMarland Kitchen200s >> 240.  Pt's meals are: - Breakfast: yoghurt + granola, cereals  - Lunch: sandwich, sushi, [place]  - Dinner: home cooked meal: stews, etc. - Snacks: 3-4   -No CKD, last BUN/creatinine:  Lab Results  Component Value Date   BUN 15 10/27/2021   CREATININE 0.89 10/27/2021  She developed angioedema from ACE inhibitors in the past.  -+ HL; last set of lipids: Lab Results  Component Value Date   CHOL 101 10/27/2021   HDL 32.30 (L) 10/27/2021   LDLCALC 37 10/27/2021   LDLDIRECT 125.0 08/21/2017   TRIG 155.0 (H) 10/27/2021   CHOLHDL 3 10/27/2021  On pravastatin added back in 08/2017 >> but feels hungry and "off" >> stopped.  Tried Zocor >> she did not like how she felt on it.   However, she now tolerates well Lipitor 20 (decreased from 40 mg).  -  last eye exam was on 03/02/2021: + DR reportedly  -+ Mild numbness and tingling in her feet.  Last foot exam 10/27/2021.  She also has a history of MS (neuro Dr Despina Arias) - last 2 atacks: 02/2012, 2010; also HTN, fatty liver, endometriosis. She had a kidney stone in 11/2016 (calcium oxalate)- had litotripsy. She had a total of 20-25 kidney stones throughout her life. 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  She sees cardiology. She decreased Amlodipine 2/2 itching, leg swelling. She was started on a diuretic.  Beta-blocker has been increased - summer 2022.  ROS:  I reviewed pt's medications, allergies, PMH, social hx, family hx, and changes were documented in the history of present illness.  Otherwise, unchanged from my initial visit note.  Past Medical History:  Diagnosis Date   Arthritis    mild   Depression    Diabetes mellitus    Type 2   Endometriosis    GERD (gastroesophageal reflux disease)    Heart murmur    History of kidney stones    Hyperlipidemia    Hypertension    Multiple sclerosis    Type II or unspecified type diabetes mellitus with peripheral circulatory disorders, uncontrolled(250.72) 11/27/2012   Past Surgical History:  Procedure Laterality Date   EXPLORATORY LAPAROTOMY     endometriosis   EXTRACORPOREAL SHOCK WAVE LITHOTRIPSY Right 11/19/2016   Procedure: RIGHT EXTRACORPOREAL SHOCK WAVE LITHOTRIPSY (ESWL);  Surgeon: Jerilee Field, MD;  Location: [facility];  Service: Urology;  Laterality: Right;   LUMBAR DISC SURGERY     LS spine 2005 or so   WISDOM TOOTH EXTRACTION     Social History   Socioeconomic History   Marital status: Single    Spouse name: Not on file   Number of children: Not on file   Years of education: Not on file   Highest education level: Not on file  Occupational History   Not on file  Tobacco Use   Smoking status: Never   Smokeless tobacco: Never  Substance and Sexual Activity   Alcohol use: Yes    Alcohol/week: 0.0 standard drinks of alcohol    Comment: occasional   Drug use: No   Sexual activity: Not on file  Other Topics Concern   Not on file  Social History Narrative   Single. Lives alone. No children. Pet cat.          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 90 tablet 3   atorvastatin (LIPITOR) 20 MG tablet Take 1 tablet (20 mg total) by mouth daily. 90 tablet 3   azelastine (ASTELIN) 0.1 % nasal spray Place into both nostrils 2 (two) times daily. Use in each nostril as directed  carvedilol (COREG) 12.5 MG tablet Take 1 tablet (12.5 mg total) by mouth 2 (two) times daily. 180 tablet 3   Continuous Blood Gluc Receiver (FREESTYLE LIBRE 2 READER) DEVI 1 each by Does not apply route daily. 1 each 3   Continuous Blood Gluc Sensor (FREESTYLE LIBRE 2 SENSOR) MISC 1 each by Does not apply route every 14 (fourteen) days. 6 each 3   COVID-19 mRNA vaccine 2023-2024 (COMIRNATY) SUSP injection Inject 0.3 ml into the muscle. 0.3 mL 0   DULoxetine (CYMBALTA) 60 MG capsule TAKE 1 CAPSULE DAILY 90 capsule 3   fluticasone (FLONASE) 50 MCG/ACT nasal spray Place 2 sprays into both nostrils as needed.     hydrochlorothiazide (HYDRODIURIL) 25 MG tablet Take 1 tablet (25 mg total) by mouth daily. 90 tablet 3   hydrocortisone (ANUSOL-HC) 25 MG suppository Place 1 suppository (25 mg total) rectally 2 (two) times daily. 12 suppository 0   Insulin Disposable Pump (V-GO 30) KIT use 1x a day 90 kit 3   insulin lispro (HUMALOG) 100 UNIT/ML injection INJECT 76 UNITS UNDER THE SKIN ONCE AS DIRECTED WITH VGO 40 30 mL 0   Insulin Pen Needle 32G X 4 MM MISC Use 1x a day 100 each 3   levocetirizine (XYZAL) 5 MG tablet Take 5 mg by mouth every evening.     levonorgestrel-ethinyl estradiol (LYBREL,AMETHYST) 90-20 MCG tablet Take 1 tablet by mouth daily.      Melatonin 5 MG CAPS Take by mouth.     metFORMIN (GLUCOPHAGE-XR) 500 MG 24 hr tablet TAKE 2 TABLETS (1,000 MG TOTAL) TWICE A DAY WITH A MEAL 360 tablet 3   modafinil (PROVIGIL) 200 MG tablet Take 1 tablet (200 mg total) by  mouth daily as needed. 90 tablet 1   Naproxen Sodium 220 MG CAPS Take 440 mg by mouth 2 (two) times daily as needed (for pain.).      natalizumab (TYSABRI) 300 MG/15ML injection Inject 300 mg into the vein every 30 (thirty) days.     Probiotic Product (PROBIOTIC PO) Take by mouth.     Semaglutide, 2 MG/DOSE, (OZEMPIC, 2 MG/DOSE,) 8 MG/3ML SOPN INJECT 2 MG ONCE A WEEK AS DIRECTED 9 mL 0   No current facility-administered medications on file prior to visit.   Allergies  Allergen Reactions   Ace Inhibitors Swelling    ANGIOEDEMA   Family History  Problem Relation Age of Onset   Breast cancer Mother        early 4050s   Diabetes Mother        maternal grandmother as well   Diabetes Father    Other Father        progressive supranuclear palsy   Colon cancer Neg Hx    Colon polyps Neg Hx    Esophageal cancer Neg Hx    Rectal cancer Neg Hx    Stomach cancer Neg Hx     PE: BP 132/88 (BP Location: Right Arm, Patient Position: Sitting, Cuff Size: Normal)   Pulse 92   Ht 5\' 6"  (1.676 m)   Wt 225 lb 3.2 oz (102.2 kg)   SpO2 98%   BMI 36.35 kg/m   Wt Readings from Last 3 Encounters:  04/23/22 225 lb 3.2 oz (102.2 kg)  03/23/22 227 lb 6.4 oz (103.1 kg)  10/27/21 229 lb (103.9 kg)   Constitutional: overweight, in NAD Eyes: EOMI, no exophthalmos ENT: no thyromegaly, no cervical lymphadenopathy Cardiovascular: RRR, No RG, +1/6 SEM Respiratory: CTA B Musculoskeletal: no deformities Skin:  no rashes Neurological: no tremor with outstretched hands  ASSESSMENT: 1. DM2, insulin-dependent, uncontrolled, with complications: - DR  She saw the diabetes educator for a discussion about insulin pumps >> decided for a VGo  2. Obesity class 2  3. HL  PLAN:  1. Patient with history of uncontrolled type 2 diabetes, exacerbated by steroid courses for MS and also extensive traveling for work.  At last visit, HbA1c was higher, at 8.0%.  Sugars appears to be fluctuating mostly within the target  range, but we had the lack of data later in the afternoon and overnight.  We discussed about scanning the device every 8 hours, but I did not recommend a change in regimen. -Since last visit, she changed her job and also her insurance.  Unfortunately, her VGo is not covered anymore.  We will send a preauthorization for to her insurance, since she is doing great on this system and I would absolutely recommend to continue with it.  We looked on her insurance formulary and neither VGo or the CeQur simplicity pump are covered.  However, OmniPod pump is covered.  We discussed that this is a more complex system, which I do not absolutely feel she needs for now, but we may need to use this if absolutely needed.  In that case, she also needs to change her CGM, to [facility].  She will also need pre-pump education. CGM interpretation: -At today's visit, we reviewed her CGM downloads: It appears that 845 of values are in target range (goal >70%), while 10% are higher than 180 (goal <25%), and 6% are lower than 70 (goal <4%).  The calculated average blood sugar is 124.  The projected HbA1c for the next 3 months ([facility]) is 6.3%. -Reviewing the CGM trends, sugars much better, fluctuating mostly within the target range.  I do not feel we need to change her regimen for now.  We gave her samples of the V-Go 30 system and will send a preauthorization for this.  We may need to go to injections for a period of time: In that case, I will need to call in Humalog pens and also long-acting insulin.   -  I advised her to:  Patient Instructions  Please continue: - Metformin ER 1000 mg 2x a day - Ozempic 2 mg weekly - VGo 30 with Humalog 15 min before meals: 3-4 clicks before b'fast 5-6 clicks before lunch 9-11 clicks before dinner  Please return in 4-6 months.  - we checked her HbA1c: 7.2% (lower) - advised to check sugars at different times of the day - 4x a day, rotating check times - advised for yearly eye exams >> she is  UTD - return to clinic in 4-6 months  2. Obesity class 2 -Will continue Ozempic which should also help with weight loss. -Weight was stable at last visit.  Unfortunately, she was out of Ozempic so she gained 15 pounds back but she was able to lose them.  At last visit she lost 4 pounds compared to the previous visit.  Since then, she lost another 4 pounds  3. HL -Reviewed her lipid panel from 10/2021: LDL at goal, HDL slightly low: Lab Results  Component Value Date   CHOL 101 10/27/2021   HDL 32.30 (L) 10/27/2021   LDLCALC 37 10/27/2021   LDLDIRECT 125.0 08/21/2017   TRIG 155.0 (H) 10/27/2021   CHOLHDL 3 10/27/2021  -She tried pravastatin and simvastatin but she did not like how this made her feel.  However, she  is now on Lipitor 20 mg daily, tolerated well.  Carlus Pavlov, MD PhD [facility] Endocrinology

## 2022-04-24 ENCOUNTER — Encounter: Payer: Self-pay | Admitting: Family Medicine

## 2022-04-24 ENCOUNTER — Other Ambulatory Visit (HOSPITAL_COMMUNITY): Payer: Self-pay

## 2022-04-25 NOTE — Telephone Encounter (Signed)
Called and left a detailed message for pt to call back regarding her insurance preferred long acting insulin.

## 2022-05-02 ENCOUNTER — Other Ambulatory Visit: Payer: Self-pay | Admitting: Internal Medicine

## 2022-05-02 DIAGNOSIS — E1159 Type 2 diabetes mellitus with other circulatory complications: Secondary | ICD-10-CM

## 2022-05-02 MED ORDER — CEQUR SIMPLICITY INSERTER MISC: 0 refills | Status: DC

## 2022-05-02 MED ORDER — CEQUR SIMPLICITY 2U DEVI: 1.0000 | 11 refills | Status: DC

## 2022-05-02 MED ORDER — LYUMJEV 100 UNIT/ML IJ SOLN: INTRAMUSCULAR | 3 refills | Status: DC

## 2022-05-02 MED ORDER — LANTUS SOLOSTAR 100 UNIT/ML ~~LOC~~ SOPN: 30.0000 [IU] | PEN_INJECTOR | Freq: Every day | SUBCUTANEOUS | 3 refills | Status: DC

## 2022-05-03 ENCOUNTER — Other Ambulatory Visit (HOSPITAL_COMMUNITY): Payer: Self-pay

## 2022-05-04 ENCOUNTER — Telehealth: Payer: Self-pay | Admitting: Pharmacy Technician

## 2022-05-04 ENCOUNTER — Other Ambulatory Visit (HOSPITAL_COMMUNITY): Payer: Self-pay

## 2022-05-04 NOTE — Telephone Encounter (Signed)
Did test claims to see what long acting insulin is covered. Lantus or Toujeo is preferred, but will only allow a 30 day supply, so she may have issues getting the concentrated Toujeo if the pharmacy will not open the box or bill the whole box for a 30 day supply.   Pharmacy Patient Advocate Encounter   Received notification from Pt calls msgs/RMA that prior authorization for Cequr is required/requested.  Per Test Claim: filled 05/03/22 - no PA needed at this time.   This test claim was processed through [facility]- copay amounts may vary at other pharmacies due to pharmacy/plan contracts, or as the patient moves through the different stages of their insurance plan.

## 2022-05-04 NOTE — Telephone Encounter (Signed)
Actually, per test claim, the Cequr was filled yesterday, so it must not need a PA.

## 2022-05-15 ENCOUNTER — Other Ambulatory Visit: Payer: Self-pay | Admitting: Obstetrics and Gynecology

## 2022-05-15 DIAGNOSIS — Z9189 Other specified personal risk factors, not elsewhere classified: Secondary | ICD-10-CM

## 2022-05-16 ENCOUNTER — Telehealth: Payer: Self-pay | Admitting: Nutrition

## 2022-05-16 NOTE — Telephone Encounter (Signed)
LVM to call me back to schedule Cequr training.

## 2022-06-05 ENCOUNTER — Other Ambulatory Visit (HOSPITAL_COMMUNITY): Payer: Self-pay

## 2022-06-05 ENCOUNTER — Telehealth: Payer: Self-pay

## 2022-06-05 NOTE — Telephone Encounter (Signed)
Patient Advocate Encounter   Received notification from [facility] that prior authorization is required for Ozempic  Submitted: 06/05/2022 Key [id number]  Status is pending

## 2022-06-06 ENCOUNTER — Other Ambulatory Visit: Payer: Self-pay

## 2022-06-06 ENCOUNTER — Telehealth: Payer: Self-pay | Admitting: *Deleted

## 2022-06-06 ENCOUNTER — Other Ambulatory Visit: Payer: Self-pay | Admitting: *Deleted

## 2022-06-06 DIAGNOSIS — Z79899 Other long term (current) drug therapy: Secondary | ICD-10-CM

## 2022-06-06 DIAGNOSIS — G35 Multiple sclerosis: Secondary | ICD-10-CM

## 2022-06-06 NOTE — Telephone Encounter (Signed)
Placed JCV lab in [facility] lock box for routine lab pick up. Results pending. 

## 2022-06-07 LAB — CBC WITH DIFFERENTIAL/PLATELET
Basophils Absolute: 0.1 10*3/uL (ref 0.0–0.2)
Basos: 1 %
EOS (ABSOLUTE): 0.5 10*3/uL — ABNORMAL HIGH (ref 0.0–0.4)
Eos: 5 %
Hematocrit: 38.2 % (ref 34.0–46.6)
Hemoglobin: 12.6 g/dL (ref 11.1–15.9)
Immature Grans (Abs): 0 10*3/uL (ref 0.0–0.1)
Immature Granulocytes: 0 %
Lymphocytes Absolute: 4.7 10*3/uL — ABNORMAL HIGH (ref 0.7–3.1)
Lymphs: 42 %
MCH: 28.6 pg (ref 26.6–33.0)
MCHC: 33 g/dL (ref 31.5–35.7)
MCV: 87 fL (ref 79–97)
Monocytes Absolute: 0.5 10*3/uL (ref 0.1–0.9)
Monocytes: 5 %
Neutrophils Absolute: 5.3 10*3/uL (ref 1.4–7.0)
Neutrophils: 47 %
Platelets: 363 10*3/uL (ref 150–450)
RBC: 4.41 x10E6/uL (ref 3.77–5.28)
RDW: 13.7 % (ref 11.7–15.4)
WBC: 11.1 10*3/uL — ABNORMAL HIGH (ref 3.4–10.8)

## 2022-06-12 NOTE — Telephone Encounter (Signed)
Pharmacy Patient Advocate Encounter  Prior Authorization has been approved  Effective dates: 06/05/22 through 06/05/23

## 2022-06-14 ENCOUNTER — Other Ambulatory Visit: Payer: Self-pay | Admitting: *Deleted

## 2022-06-14 ENCOUNTER — Encounter: Payer: Self-pay | Admitting: Neurology

## 2022-06-14 MED ORDER — MODAFINIL 200 MG PO TABS: 200.0000 mg | ORAL_TABLET | Freq: Every day | ORAL | 1 refills | Status: DC | PRN

## 2022-06-14 NOTE — Telephone Encounter (Signed)
Pt sent mychart message stating insurance changed. Would like rx modafinil sent [facility] ([facility]  Last seen 03/12/22 and next f/u 09/11/22. Last refilled 02/26/22 #90.

## 2022-06-14 NOTE — Telephone Encounter (Signed)
JCV ab drawn on 06/06/22 indeterminate, index: 0.29. Inhibition assay: negative.

## 2022-06-18 ENCOUNTER — Other Ambulatory Visit: Payer: Self-pay | Admitting: Internal Medicine

## 2022-06-19 ENCOUNTER — Other Ambulatory Visit: Payer: Self-pay

## 2022-06-19 MED ORDER — CEQUR SIMPLICITY INSERTER MISC: 0 refills | Status: DC

## 2022-06-19 MED ORDER — CEQUR SIMPLICITY 2U DEVI: 1.0000 | 11 refills | Status: DC

## 2022-06-21 ENCOUNTER — Telehealth: Payer: Self-pay | Admitting: *Deleted

## 2022-06-21 NOTE — Telephone Encounter (Signed)
Faxed form below back to [facility] at [phone]. Received fax confirmation.

## 2022-06-22 ENCOUNTER — Other Ambulatory Visit (HOSPITAL_COMMUNITY): Payer: Self-pay

## 2022-06-22 ENCOUNTER — Telehealth: Payer: Self-pay

## 2022-06-22 NOTE — Telephone Encounter (Signed)
Inbound call from insurance for follow up on PA sent via CMM. Key: [facility]

## 2022-06-25 ENCOUNTER — Other Ambulatory Visit (HOSPITAL_COMMUNITY): Payer: Self-pay

## 2022-06-26 NOTE — Telephone Encounter (Signed)
Left v/m and sent my chart message

## 2022-07-06 ENCOUNTER — Encounter: Payer: Self-pay | Admitting: Neurology

## 2022-07-11 ENCOUNTER — Encounter: Payer: BC Managed Care – PPO | Admitting: Family Medicine

## 2022-07-18 LAB — HM MAMMOGRAPHY

## 2022-08-23 ENCOUNTER — Other Ambulatory Visit: Payer: Self-pay

## 2022-08-28 LAB — HM DIABETES EYE EXAM

## 2022-08-29 ENCOUNTER — Other Ambulatory Visit: Payer: Self-pay

## 2022-08-29 ENCOUNTER — Telehealth: Payer: Self-pay | Admitting: *Deleted

## 2022-08-29 ENCOUNTER — Other Ambulatory Visit: Payer: Self-pay | Admitting: *Deleted

## 2022-08-29 DIAGNOSIS — Z79899 Other long term (current) drug therapy: Secondary | ICD-10-CM

## 2022-08-29 DIAGNOSIS — G35 Multiple sclerosis: Secondary | ICD-10-CM

## 2022-08-29 NOTE — Telephone Encounter (Signed)
Placed JCV lab in [facility] lock box for routine lab pick up. Results pending. 

## 2022-08-30 ENCOUNTER — Encounter: Payer: Self-pay | Admitting: Internal Medicine

## 2022-08-30 LAB — CBC WITH DIFFERENTIAL/PLATELET
Basophils Absolute: 0.1 10*3/uL (ref 0.0–0.2)
Basos: 1 %
EOS (ABSOLUTE): 0.6 10*3/uL — ABNORMAL HIGH (ref 0.0–0.4)
Eos: 5 %
Hematocrit: 39.2 % (ref 34.0–46.6)
Hemoglobin: 12.8 g/dL (ref 11.1–15.9)
Immature Grans (Abs): 0 10*3/uL (ref 0.0–0.1)
Immature Granulocytes: 0 %
Lymphocytes Absolute: 5 10*3/uL — ABNORMAL HIGH (ref 0.7–3.1)
Lymphs: 42 %
MCH: 28.6 pg (ref 26.6–33.0)
MCHC: 32.7 g/dL (ref 31.5–35.7)
MCV: 88 fL (ref 79–97)
Monocytes Absolute: 0.5 10*3/uL (ref 0.1–0.9)
Monocytes: 4 %
Neutrophils Absolute: 5.7 10*3/uL (ref 1.4–7.0)
Neutrophils: 48 %
Platelets: 323 10*3/uL (ref 150–450)
RBC: 4.47 x10E6/uL (ref 3.77–5.28)
RDW: 14.2 % (ref 11.7–15.4)
WBC: 11.9 10*3/uL — ABNORMAL HIGH (ref 3.4–10.8)

## 2022-09-10 ENCOUNTER — Ambulatory Visit: Payer: 59 | Admitting: Internal Medicine

## 2022-09-11 ENCOUNTER — Ambulatory Visit: Payer: 59 | Admitting: Neurology

## 2022-09-11 ENCOUNTER — Encounter: Payer: Self-pay | Admitting: Neurology

## 2022-09-11 VITALS — BP 132/81 | HR 85 | Ht 66.0 in | Wt 220.0 lb

## 2022-09-11 DIAGNOSIS — G35 Multiple sclerosis: Secondary | ICD-10-CM | POA: Diagnosis not present

## 2022-09-11 DIAGNOSIS — E119 Type 2 diabetes mellitus without complications: Secondary | ICD-10-CM

## 2022-09-11 DIAGNOSIS — R269 Unspecified abnormalities of gait and mobility: Secondary | ICD-10-CM | POA: Diagnosis not present

## 2022-09-11 DIAGNOSIS — G4710 Hypersomnia, unspecified: Secondary | ICD-10-CM

## 2022-09-11 DIAGNOSIS — Z79899 Other long term (current) drug therapy: Secondary | ICD-10-CM | POA: Diagnosis not present

## 2022-09-11 DIAGNOSIS — Z794 Long term (current) use of insulin: Secondary | ICD-10-CM

## 2022-09-11 MED ORDER — MODAFINIL 200 MG PO TABS: 200.0000 mg | ORAL_TABLET | Freq: Every day | ORAL | 1 refills | Status: DC | PRN

## 2022-09-11 NOTE — Telephone Encounter (Signed)
Index value: 0.24 JCV antibody indeterminate   Inhibition assay final results: negative.

## 2022-09-11 NOTE — Progress Notes (Signed)
[facility]  PATIENT: Kristin Mathews DOB: May 22, 1970  REFERRING CLINICIAN: Dr. Cato Mulligan  HISTORY FROM: Patient  REASON FOR VISIT: MS   HISTORICAL  CHIEF COMPLAINT:  Chief Complaint  Patient presents with   Follow-up    Pt in room 11. Here for MS follow up. Pt reports being stable, no concerns.     HISTORY OF PRESENT ILLNESS:  Kristin Mathews is a 52 y.o. woman with relapsing remitting MS dx 2001.  Update 09/11/2022 Her MS continues to be stable on Tysabri.  She tolerates it well and has not had any exacerbations.  She was JCV antibody negative when last checked  8/14/20224 and was negativeat 0.24. Marland Kitchen   She had one low positive readng.   Her last brain MRI 01/20/2018 showed an overall small plaque burden.  We have no recent MRI of the cervical spine.  Feels her gait is stable with mild reduced balance.  She uses the banister and stairs.  She has had rare falls.  Strength is normal.  She reports normal sensation except for mild Mathews tingling (also has IDDM T2).     She has mild urinary urgency.   Vision function is ok but she has had diabetic retinopathy that is being followed closely.    She notes notes fatigue and does better after nap.    She is sleeping well most nights (6-8 hours during the work week and 12-15 hours on weekends).   Modafinil helps some.  Due to HTN prefers not to try a stimulant.   She has had a PSG in the past and she had no OSA.    She denies any difficulties with cognition.  Her mood is fine.  She had more LBP recently after sleeping funny and has seen chiropractor with some benefit.  She had surgery at L4L5 in 2006.    She has lost 30 pounds since starting Ozempic  She changed jobs and is less stressed.     She tested for BRCA and was negative (mom had breast cancer)  MS History: She presented with unilateral numbness in May 2001 and was diagnosed with transverse myelitis.    MRI only showed one spot and an LP was non-diagnostic.   She had  another episode of numbness 6 months later and MRI showed several newer lesions.    She was placed on Rebif as part of the Rebiject study.     She had an exacerbation in 2007 and in 2010 with numbness and then optic neuritis.   In 2015, she had another numbness exacerbation and we decided to switch to Tysabri.    She tolerates Tysabri well.    Imaging: MRI of the brain 01/20/2018 showed T2/flair hyperintense foci in the periventricular, juxtacortical and deep white matter.  These are consistent with chronic demyelinating plaque associated with multiple sclerosis though some of the foci are nonspecific.  MRI brain 12/29/2020 showed no new lesions  MRI cervical spine 12/29/2020 showed  T2 hyperintense foci are noted anteriorly at the cervicomedullary junction, posterior laterally to the left adjacent to C2-C3, posterolaterally to the right adjacent to C3-C4.  There is a possible subtle focus posteriorly to the left at C6-C7.   MRI thoracic spine 12/29/2020 showed T2 hyperintense focus within the spinal cord centrally adjacent to T7-T8.  This is nonspecific but is consistent with her diagnosis of multiple sclerosis.      No degenerative changes are noted      REVIEW OF SYSTEMS:  Constitutional: No  fevers, chills, sweats, or change in appetite.   Has fatigue.  Has Poor sleep Eyes: No visual changes, double vision, eye pain Ear, nose and throat: No hearing loss, ear pain, nasal congestion, sore throat Cardiovascular: No chest pain, palpitations Respiratory:  No shortness of breath at rest or with exertion.   No wheezes.   Snoring GastrointestinaI: No nausea, vomiting, diarrhea, abdominal pain.   Rare fecal incontinence Genitourinary:    She reports urgency with rare incontinence Musculoskeletal:  No neck pain, back pain Integumentary: No rash, pruritus, skin lesions Neurological: as above Psychiatric: No depression at this time.  No anxiety Endocrine: No palpitations, diaphoresis, change in  appetite, change in weigh or increased thirst Hematologic/Lymphatic:  No anemia, purpura, petechiae. Allergic/Immunologic: No itchy/runny eyes, nasal congestion, recent allergic reactions, rashes  ALLERGIES: Allergies  Allergen Reactions   Ace Inhibitors Swelling    ANGIOEDEMA    HOME MEDICATIONS: Outpatient Medications Prior to Visit  Medication Sig Dispense Refill   amLODipine (NORVASC) 5 MG tablet Take 1 tablet (5 mg total) by mouth daily. 90 tablet 3   atorvastatin (LIPITOR) 20 MG tablet Take 1 tablet (20 mg total) by mouth daily. 90 tablet 3   carvedilol (COREG) 12.5 MG tablet Take 1 tablet (12.5 mg total) by mouth 2 (two) times daily. 180 tablet 3   Continuous Blood Gluc Receiver (FREESTYLE LIBRE 2 READER) DEVI 1 each by Does not apply route daily. 1 each 3   Continuous Blood Gluc Sensor (FREESTYLE LIBRE 2 SENSOR) MISC 1 each by Does not apply route every 14 (fourteen) days. 6 each 3   COVID-19 mRNA vaccine 2023-2024 (COMIRNATY) SUSP injection Inject 0.3 ml into the muscle. 0.3 mL 0   DULoxetine (CYMBALTA) 60 MG capsule TAKE 1 CAPSULE DAILY 90 capsule 3   hydrochlorothiazide (HYDRODIURIL) 25 MG tablet Take 1 tablet (25 mg total) by mouth daily. 90 tablet 3   injection device for insulin (CEQUR SIMPLICITY 2U) DEVI 1 each by Other route every 3 (three) days. 10 each 11   Injection Device for Insulin (CEQUR SIMPLICITY INSERTER) MISC Use as advised 1 each 0   insulin glargine (LANTUS SOLOSTAR) 100 UNIT/ML Solostar Pen inject 30 Units into the skin at bedtime. 30 mL 3   insulin lispro (HUMALOG) 100 UNIT/ML injection INJECT 76 UNITS UNDER THE SKIN ONCE AS DIRECTED WITH VGO 40 30 mL 0   Insulin Lispro-aabc (LYUMJEV) 100 UNIT/ML SOLN Use up to 30 units a day in the insulin pump 30 mL 3   levocetirizine (XYZAL) 5 MG tablet Take 5 mg by mouth every evening.     levonorgestrel-ethinyl estradiol (LYBREL,AMETHYST) 90-20 MCG tablet Take 1 tablet by mouth daily.      Melatonin 5 MG CAPS Take by  mouth.     metFORMIN (GLUCOPHAGE-XR) 500 MG 24 hr tablet TAKE 2 TABLETS (1,000 MG TOTAL) TWICE A DAY WITH A MEAL 360 tablet 3   Naproxen Sodium 220 MG CAPS Take 440 mg by mouth 2 (two) times daily as needed (for pain.).      natalizumab (TYSABRI) 300 MG/15ML injection Inject 300 mg into the vein every 30 (thirty) days.     Probiotic Product (PROBIOTIC PO) Take by mouth.     Semaglutide, 2 MG/DOSE, (OZEMPIC, 2 MG/DOSE,) 8 MG/3ML SOPN INJECT 2 MG ONCE A WEEK AS DIRECTED 9 mL 3   modafinil (PROVIGIL) 200 MG tablet Take 1 tablet (200 mg total) by mouth daily as needed. 90 tablet 1   azelastine (ASTELIN) 0.1 % nasal  spray Place into both nostrils 2 (two) times daily. Use in each nostril as directed (Patient not taking: Reported on 09/11/2022)     fluticasone (FLONASE) 50 MCG/ACT nasal spray Place 2 sprays into both nostrils as needed. (Patient not taking: Reported on 09/11/2022)     hydrocortisone (ANUSOL-HC) 25 MG suppository Place 1 suppository (25 mg total) rectally 2 (two) times daily. (Patient not taking: Reported on 09/11/2022) 12 suppository 0   Insulin Pen Needle 32G X 4 MM MISC Use 1x a day (Patient not taking: Reported on 09/11/2022) 100 each 3   No facility-administered medications prior to visit.    PAST MEDICAL HISTORY: Past Medical History:  Diagnosis Date   Arthritis    mild   Depression    Diabetes mellitus    Type 2   Endometriosis    GERD (gastroesophageal reflux disease)    Heart murmur    History of kidney stones    Hyperlipidemia    Hypertension    Multiple sclerosis (HCC)    Type II or unspecified type diabetes mellitus with peripheral circulatory disorders, uncontrolled(250.72) 11/27/2012    PAST SURGICAL HISTORY: Past Surgical History:  Procedure Laterality Date   EXPLORATORY LAPAROTOMY     endometriosis   EXTRACORPOREAL SHOCK WAVE LITHOTRIPSY Right 11/19/2016   Procedure: RIGHT EXTRACORPOREAL SHOCK WAVE LITHOTRIPSY (ESWL);  Surgeon: Jerilee Field, MD;   Location: [facility];  Service: Urology;  Laterality: Right;   LUMBAR DISC SURGERY     LS spine 2005 or so   WISDOM TOOTH EXTRACTION      FAMILY HISTORY: Family History  Problem Relation Age of Onset   Breast cancer Mother        early 77s   Diabetes Mother        maternal grandmother as well   Diabetes Father    Other Father        progressive supranuclear palsy   Colon cancer Neg Hx    Colon polyps Neg Hx    Esophageal cancer Neg Hx    Rectal cancer Neg Hx    Stomach cancer Neg Hx     SOCIAL HISTORY:  Social History   Socioeconomic History   Marital status: Single    Spouse name: Not on file   Number of children: Not on file   Years of education: Not on file   Highest education level: Not on file  Occupational History   Not on file  Tobacco Use   Smoking status: Never   Smokeless tobacco: Never  Substance and Sexual Activity   Alcohol use: Yes    Alcohol/week: 0.0 standard drinks of alcohol    Comment: occasional   Drug use: No   Sexual activity: Not on file  Other Topics Concern   Not on file  Social History Narrative   Single. Lives alone. No children. Pet cat.          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   General: The patient is well-developed and well-nourished and in no acute distress   Neurologic  Exam  Mental status: The patient is alert and oriented x 3 at the time of the examination. The patient has apparent normal recent and remote memory, with an apparently normal attention span and concentration ability.   Speech is normal.  Cranial nerves: Extraocular movements are full.  Her vision and, vision is symmetric.Marland Kitchen There is mild reduced right facial sensation to soft touch.  Facial strength is normal.  Trapezius and sternocleidomastoid strength is normal. No dysarthria is noted.   no obvious hearing deficits are noted.  Motor:  Muscle bulk and tone are normal. Strength is  5 / 5 in all 4 extremities.   Sensory: Sensory testing is intact to pinprick, soft touch, vibration sensation, and position sense in her hands and knees but mildly reduced vibration sensation in the toes  Coordination: Cerebellar testing reveals Good finger-to-nose and heel-to-shin bilaterally.  Gait and station: Station is normal.  Gait is mildly wide..  The tandem gait is wide.  Romberg is negative.  Reflexes: Deep tendon reflexes are symmetric and normal bilaterally     DIAGNOSTIC DATA (LABS, IMAGING, TESTING) - I reviewed patient records, labs, notes, testing and imaging myself where available.  Lab Results  Component Value Date   WBC 11.9 (H) 08/29/2022   HGB 12.8 08/29/2022   HCT 39.2 08/29/2022   MCV 88 08/29/2022   PLT 323 08/29/2022      Component Value Date/Time   NA 139 10/27/2021 1106   NA 138 12/31/2017 1421   K 3.5 10/27/2021 1106   CL 102 10/27/2021 1106   CO2 26 10/27/2021 1106   GLUCOSE 128 (H) 10/27/2021 1106   BUN 15 10/27/2021 1106   BUN 14 12/31/2017 1421   CREATININE 0.89 10/27/2021 1106   CREATININE 0.73 04/30/2014 1109   CALCIUM 9.2 10/27/2021 1106   PROT 6.5 10/27/2021 1106   PROT 6.6 12/31/2017 1421   ALBUMIN 3.7 10/27/2021 1106   ALBUMIN 4.2 12/31/2017 1421   AST 33 10/27/2021 1106   ALT 31 10/27/2021 1106   ALKPHOS 76 10/27/2021 1106   BILITOT 0.3 10/27/2021 1106    BILITOT 0.2 12/31/2017 1421   GFRNONAA 78 12/31/2017 1421   GFRNONAA >89 04/30/2014 1109   GFRAA 90 12/31/2017 1421   GFRAA >89 04/30/2014 1109   Lab Results  Component Value Date   CHOL 101 10/27/2021   HDL 32.30 (L) 10/27/2021   LDLCALC 37 10/27/2021   LDLDIRECT 125.0 08/21/2017   TRIG 155.0 (H) 10/27/2021   CHOLHDL 3 10/27/2021   Lab Results  Component Value Date   HGBA1C 7.2 (A) 04/23/2022   No results found for: "VITAMINB12" Lab Results  Component Value Date   TSH 2.97 04/22/2018       ASSESSMENT AND PLAN  Relapsing remitting multiple sclerosis (HCC)  High risk medication use  Gait disturbance  Excessive sleepiness  Type 2 diabetes mellitus without complication, unspecified whether long term insulin use (HCC)   1.   Continue Tysabri.  She is JCV ab negative.  2.   Stay active and exercise as tolerated.   3    continue Provigil for hypersomnia  4.   she will return to see me in 6 months if  stable or sooner if she notes other new or worsening symptoms.  Kristin Hair A. Epimenio Foot, MD, PhD 09/11/2022, 7:48 PM Certified in Neurology, Clinical Neurophysiology, Sleep Medicine, Pain Medicine and Neuroimaging  [facility] [address] [place], [place] [address] [phone]

## 2022-10-23 ENCOUNTER — Encounter: Payer: Self-pay | Admitting: Internal Medicine

## 2022-10-23 ENCOUNTER — Ambulatory Visit: Payer: 59 | Admitting: Internal Medicine

## 2022-10-23 VITALS — BP 130/78 | HR 91 | Ht 66.0 in | Wt 220.6 lb

## 2022-10-23 DIAGNOSIS — E785 Hyperlipidemia, unspecified: Secondary | ICD-10-CM

## 2022-10-23 DIAGNOSIS — Z794 Long term (current) use of insulin: Secondary | ICD-10-CM

## 2022-10-23 DIAGNOSIS — E66812 Obesity, class 2: Secondary | ICD-10-CM | POA: Diagnosis not present

## 2022-10-23 DIAGNOSIS — Z7985 Long-term (current) use of injectable non-insulin antidiabetic drugs: Secondary | ICD-10-CM

## 2022-10-23 DIAGNOSIS — E1165 Type 2 diabetes mellitus with hyperglycemia: Secondary | ICD-10-CM

## 2022-10-23 DIAGNOSIS — Z7984 Long term (current) use of oral hypoglycemic drugs: Secondary | ICD-10-CM | POA: Diagnosis not present

## 2022-10-23 DIAGNOSIS — E1159 Type 2 diabetes mellitus with other circulatory complications: Secondary | ICD-10-CM

## 2022-10-23 LAB — POCT GLYCOSYLATED HEMOGLOBIN (HGB A1C): Hemoglobin A1C: 7.2 % — AB (ref 4.0–5.6)

## 2022-10-23 MED ORDER — LANTUS SOLOSTAR 100 UNIT/ML ~~LOC~~ SOPN: PEN_INJECTOR | SUBCUTANEOUS | Status: DC

## 2022-10-23 MED ORDER — LYUMJEV KWIKPEN 200 UNIT/ML ~~LOC~~ SOPN: 10.0000 [IU] | PEN_INJECTOR | Freq: Three times a day (TID) | SUBCUTANEOUS | 3 refills | Status: DC

## 2022-10-23 MED ORDER — INSULIN PEN NEEDLE 32G X 4 MM MISC: 3 refills | Status: AC

## 2022-10-23 NOTE — Patient Instructions (Addendum)
Please continue: - Metformin ER 1000 mg 2x a day - Ozempic 2 mg weekly  Change: - Lantus 34 units at bedtime - Lyumjev before meals - insulin to carb ratio 1:4  Please return in 4-6 months.

## 2022-10-23 NOTE — Progress Notes (Signed)
Patient ID: Kristin Mathews, female   DOB: Jan 13, 1971, 52 y.o.   MRN: [id number]  HPI: Kristin Mathews is a 52 y.o.-year-old female, presenting for f/u for DM2, dx 2009, insulin-dependent since 2009, uncontrolled, with complications (diabetic retinopathy). Last visit 6 months ago. Since last visit, she changed her job and also her insurance >> now [employer].  Her co-pays are higher.  Interim history: No increased urination, blurry vision, nausea, chest pain. VGo is not covered anymore under the new insurance.  She was able to start on the CeQur insulin pump since last visit. She does not like this particular pump and is interested in switching back to pens. She has had a lot of stress at work in the last 3 weeks >> relaxed diet >> she saw higher blood sugars.  Reviewed HbA1c levels: Lab Results  Component Value Date   HGBA1C 7.2 (A) 04/23/2022   HGBA1C 8.0 (A) 10/27/2021   HGBA1C 7.4 (A) 09/02/2020   HGBA1C 6.9 (A) 04/08/2020   HGBA1C 7.0 (A) 10/26/2019   HGBA1C 7.4 (A) 06/29/2019   HGBA1C 8.7 (A) 03/30/2019   HGBA1C 8.4 (A) 04/22/2018   HGBA1C 7.9 (A) 10/07/2017   HGBA1C 9 01/31/2017   HGBA1C 8.3 (H) 08/21/2016   HGBA1C 7.6 01/23/2016   HGBA1C 9.0 09/26/2015   HGBA1C 8.5 (H) 02/22/2015   HGBA1C 8.2 08/03/2014   HGBA1C 8.6 (H) 04/30/2014   HGBA1C 9.3 (H) 11/02/2013   HGBA1C 7.1 (H) 04/14/2013   HGBA1C 8.0 (H) 11/12/2012   HGBA1C 7.3 (H) 05/13/2012   HGBA1C 6.8 (H) 08/21/2011   HGBA1C 6.5 11/15/2010   HGBA1C 7.3 (H) 07/04/2010   HGBA1C 8.1 (H) 02/17/2010   HGBA1C 7.3 (H) 10/13/2009   HGBA1C 7.4 (H) 06/07/2009   HGBA1C 8.4 (H) 01/18/2009   HGBA1C 6.6 (H) 06/29/2008   HGBA1C 6.6 (H) 02/25/2008   HGBA1C 8.3 (H) 08/29/2006  03/09/2021: HbA1c 7.3%  She prev. Had frequent steroid courses for MS. Not recently.  At last visit she was on: - Metformin ER 1000 mg 2x a day - Victoza 1.8 mg daily in a.m. >> Ozempic 1 >> 2 mg weekly - VGo 40 - 1 click for 15g >> 10 g carbs >> 5 clicks per L  and 8-10 clicks per D >> VGo30 3-4 clicks before b'fast 5-6 clicks before lunch 9-11 clicks before dinner She was on Invokana 100 >> 300 >> had few yeast infections - stopped in 05/2013 and tried again in 2017 >> stopped again b/c yeast inf. She tried [place] but stopped because yeast infection 04/2018 We stopped Humulin 70/30 25 units bid. Januvia - did not help.  Glumetza not covered by insurance. She was previously on glipizide XL in the morning, stopped 11/2019.  Currently on: - Metformin ER 1000 mg 2x a day - Ozempic 2 mg weekly - Lantus 30 >> 38 units at bedtime - CeQur with Lyumjev before meals:  3-4 clicks before b'fast 5-6 clicks before lunch 9-11 clicks before dinner >> ICR ~8  She checks her sugars more than 4 times a day with her freestyle libre CGM (with receiver):  Previously:  Previously:   Lowest CBG: 21!! (at night) ...>> LO >> 40; she has hypoglycemia awareness in the 74s. Highest >300 >> .Marland KitchenMarland Kitchen200s >> 240.  Pt's meals are: - Breakfast: yoghurt + granola, cereals  - Lunch: sandwich, sushi, [place]  - Dinner: home cooked meal: stews, etc. - Snacks: 3-4   -No CKD, last BUN/creatinine:  Lab Results  Component Value Date  BUN 15 10/27/2021   CREATININE 0.89 10/27/2021   Lab Results  Component Value Date   MICRALBCREAT 0.8 10/27/2021   MICRALBCREAT 2.0 11/01/2020   MICRALBCREAT 1.7 07/22/2020   MICRALBCREAT 4.3 03/30/2019   MICRALBCREAT 3.4 03/13/2018   MICRALBCREAT 9.6 08/21/2016   MICRALBCREAT 3.4 04/30/2014   MICRALBCREAT 1.1 11/12/2012   MICRALBCREAT 2.9 05/13/2012   MICRALBCREAT 1.8 07/04/2010  She developed angioedema from ACE inhibitors in the past.  -+ HL; last set of lipids: Lab Results  Component Value Date   CHOL 101 10/27/2021   HDL 32.30 (L) 10/27/2021   LDLCALC 37 10/27/2021   LDLDIRECT 125.0 08/21/2017   TRIG 155.0 (H) 10/27/2021   CHOLHDL 3 10/27/2021  On pravastatin added back in 08/2017 >> but feels hungry and "off" >>  stopped.  Tried Zocor >> she did not like how she felt on it.   Now on Lipitor 20 mg daily.  - last eye exam was on 08/28/2022: no DR.  -+ Mild numbness and tingling in her feet.  Last foot exam 10/27/2021.  She also has a history of MS (neuro Dr Despina Arias) - last 2 atacks: 02/2012, 2010; also HTN, fatty liver, endometriosis. She had a kidney stone in 11/2016 (calcium oxalate)- had litotripsy. She had a total of 20-25 kidney stones throughout her life. 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  She sees cardiology. She decreased Amlodipine 2/2 itching, leg swelling. She was started on a diuretic.  Beta-blocker has been increased - summer 2022.  ROS:  I reviewed pt's medications, allergies, PMH, social hx, family hx, and changes were documented in the history of present illness. Otherwise, unchanged from my initial visit note.  Past Medical History:  Diagnosis Date   Arthritis    mild   Depression    Diabetes mellitus    Type 2   Endometriosis    GERD (gastroesophageal reflux disease)    Heart murmur    History of kidney stones    Hyperlipidemia    Hypertension    Multiple sclerosis (HCC)    Type II or unspecified type diabetes mellitus with peripheral circulatory disorders, uncontrolled(250.72) 11/27/2012   Past Surgical History:  Procedure Laterality Date   EXPLORATORY LAPAROTOMY     endometriosis   EXTRACORPOREAL SHOCK WAVE LITHOTRIPSY Right 11/19/2016   Procedure: RIGHT EXTRACORPOREAL SHOCK WAVE LITHOTRIPSY (ESWL);  Surgeon: Jerilee Field, MD;  Location: [facility];  Service: Urology;  Laterality: Right;   LUMBAR DISC SURGERY     LS spine 2005 or so   WISDOM TOOTH EXTRACTION     Social History   Socioeconomic History   Marital status: Single    Spouse name: Not on file   Number of children: Not on file   Years of education: Not on file   Highest education level: Not on file  Occupational History   Not on file  Tobacco  Use   Smoking status: Never   Smokeless tobacco: Never  Substance and Sexual Activity   Alcohol use: Yes    Alcohol/week: 0.0 standard drinks of alcohol    Comment: occasional   Drug use: No   Sexual activity: Not on file  Other Topics Concern   Not on file  Social History Narrative   Single. Lives alone. No children. Pet cat.          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 90 tablet 3   atorvastatin (LIPITOR) 20 MG tablet Take 1 tablet (20 mg total) by mouth daily. 90 tablet 3   azelastine (ASTELIN) 0.1 % nasal spray Place into both nostrils 2 (two) times daily. Use in each nostril as directed (Patient not taking: Reported on 09/11/2022)     carvedilol (COREG) 12.5 MG tablet Take 1 tablet (12.5 mg total) by mouth 2 (two) times daily. 180 tablet 3   Continuous Blood Gluc Receiver (FREESTYLE LIBRE 2 READER) DEVI 1 each by Does not apply route daily. 1 each 3   Continuous Blood Gluc Sensor (FREESTYLE LIBRE 2 SENSOR) MISC 1 each by Does not apply route every 14 (fourteen) days. 6 each 3   COVID-19 mRNA vaccine 2023-2024 (COMIRNATY) SUSP injection Inject 0.3 ml into the muscle. 0.3 mL 0   DULoxetine (CYMBALTA) 60 MG capsule TAKE 1 CAPSULE DAILY 90 capsule 3   fluticasone (FLONASE) 50 MCG/ACT nasal spray Place 2 sprays into both nostrils as  needed. (Patient not taking: Reported on 09/11/2022)     hydrochlorothiazide (HYDRODIURIL) 25 MG tablet Take 1 tablet (25 mg total) by mouth daily. 90 tablet 3   hydrocortisone (ANUSOL-HC) 25 MG suppository Place 1 suppository (25 mg total) rectally 2 (two) times daily. (Patient not taking: Reported on 09/11/2022) 12 suppository 0   injection device for insulin (CEQUR SIMPLICITY 2U) DEVI 1 each by Other route every 3 (three) days. 10 each 11   Injection Device for Insulin (CEQUR SIMPLICITY INSERTER) MISC Use as advised 1 each 0   insulin glargine (LANTUS SOLOSTAR) 100 UNIT/ML Solostar Pen inject 30 Units into the skin at bedtime. 30 mL 3   insulin lispro (HUMALOG) 100 UNIT/ML injection INJECT 76 UNITS UNDER THE SKIN ONCE AS DIRECTED WITH VGO 40 30 mL 0   Insulin Lispro-aabc (LYUMJEV) 100 UNIT/ML SOLN Use up to 30 units a day in the insulin pump 30 mL 3   Insulin Pen Needle 32G X 4 MM MISC Use 1x a day (Patient not taking: Reported on 09/11/2022) 100 each 3   levocetirizine (XYZAL) 5 MG tablet Take 5 mg by mouth every evening.     levonorgestrel-ethinyl estradiol (LYBREL,AMETHYST) 90-20 MCG tablet Take 1 tablet by mouth daily.      Melatonin 5 MG CAPS Take by mouth.     metFORMIN (GLUCOPHAGE-XR) 500 MG 24 hr tablet TAKE 2 TABLETS (1,000 MG TOTAL) TWICE A DAY WITH A MEAL 360 tablet 3   modafinil (PROVIGIL) 200 MG tablet Take 1 tablet (200 mg total) by mouth daily as needed. 90 tablet 1   Naproxen Sodium 220 MG CAPS Take 440 mg by mouth 2 (two) times daily as needed (for pain.).      natalizumab (TYSABRI) 300 MG/15ML injection Inject 300 mg into the vein every 30 (thirty) days.     Probiotic Product (  PROBIOTIC PO) Take by mouth.     Semaglutide, 2 MG/DOSE, (OZEMPIC, 2 MG/DOSE,) 8 MG/3ML SOPN INJECT 2 MG ONCE A WEEK AS DIRECTED 9 mL 3   No current facility-administered medications on file prior to visit.   Allergies  Allergen Reactions   Ace Inhibitors Swelling    ANGIOEDEMA   Family History   Problem Relation Age of Onset   Breast cancer Mother        early 56s   Diabetes Mother        maternal grandmother as well   Diabetes Father    Other Father        progressive supranuclear palsy   Colon cancer Neg Hx    Colon polyps Neg Hx    Esophageal cancer Neg Hx    Rectal cancer Neg Hx    Stomach cancer Neg Hx    PE: BP 130/78   Pulse 91   Ht 5\' 6"  (1.676 m)   Wt 220 lb 9.6 oz (100.1 kg)   SpO2 99%   BMI 35.61 kg/m   Wt Readings from Last 3 Encounters:  10/23/22 220 lb 9.6 oz (100.1 kg)  09/11/22 220 lb (99.8 kg)  04/23/22 225 lb 3.2 oz (102.2 kg)   Constitutional: overweight, in NAD Eyes: EOMI, no exophthalmos ENT: no thyromegaly, no cervical lymphadenopathy Cardiovascular: RRR, No RG, +1/6 SEM Respiratory: CTA B Musculoskeletal: no deformities Skin: no rashes Neurological: no tremor with outstretched hands Diabetic Foot Exam - Simple   Simple Foot Form Diabetic Foot exam was performed with the following findings: Yes 10/23/2022  4:19 PM  Visual Inspection No deformities, no ulcerations, no other skin breakdown bilaterally: Yes Sensation Testing Intact to touch and monofilament testing bilaterally: Yes Pulse Check Posterior Tibialis and Dorsalis pulse intact bilaterally: Yes Comments    ASSESSMENT: 1. DM2, insulin-dependent, uncontrolled, with complications: - DR  2. Obesity class 2  3. HL  PLAN:  1. Patient with history of uncontrolled type 2 diabetes, exacerbated by steroid courses for MS and also extensive traveling for work.  At last visit, his blood sugars were much better, fluctuating mostly within the target range and HbA1c was also lower, at 7.2%.  We continued the same regimen. -Before last visit, she changed her job and also her insurance. Neither VGo or the CeQur simplicity pump were covered while OmniPod was.  However, afterwards, she was able to get the CeQur simplicity system. CGM interpretation: -At today's visit, we reviewed her CGM  downloads: It appears that 79% of values are in target range (goal >70%), while 11% are higher than 180 (goal <25%), and 10% are lower than 70 (goal <4%).  The calculated average blood sugar is 122.  The projected HbA1c for the next 3 months (GMI) is 6.2%. -Reviewing the CGM trends, sugars are fluctuating within the target range mostly during the day, but increasing in the late afternoon and after dinner.  Upon questioning, she relaxed her diet since last visit as she had significantly increased stress at work.  She also increased her Lantus dose since then but due to the lower blood sugars throughout the day, I recommended to back off the dose slightly.  She is contemplating the mealtime Lyumjev dose based on insulin to carb ratios nowadays and I advised her to continue with this.  She uses an approximate ratio of 1:4, which we will continue. -She is interested in trying to have the insulin pump and we can definitely do that.  I called in a  prescription for Lyumjev pens to her pharmacy.  Will switch to the U200 insulin concentration. -  I advised her to:  Patient Instructions  Please continue: - Metformin ER 1000 mg 2x a day - Ozempic 2 mg weekly  Change: - Lantus 34 units at bedtime - Lyumjev before meals - insulin to carb ratio 1:4  Please return in 4-6 months.  - we checked her HbA1c: 7.2% (stable) - advised to check sugars at different times of the day - 4x a day, rotating check times - advised for yearly eye exams >> she is UTD - we we will check annual labs today - return to clinic in 4-6 months  2. Obesity class 2 -She continues on Ozempic which should also help with weight loss.  A PA was recently approved. -She lost 4 pounds before last visit and 5 more since then  3. HL -Reviewed latest lipid panel from 10/2021: HDL low, otherwise fractions at goal: Lab Results  Component Value Date   CHOL 101 10/27/2021   HDL 32.30 (L) 10/27/2021   LDLCALC 37 10/27/2021   LDLDIRECT 125.0  08/21/2017   TRIG 155.0 (H) 10/27/2021   CHOLHDL 3 10/27/2021  -She tried pravastatin and simvastatin but she did not like how this made her feel.  However, she is now on Lipitor 20 mg daily, tolerated well. -Will check a lipid panel today  Component     Latest Ref Rng 10/23/2022  Sodium     135 - 145 mEq/L 139   Potassium     3.5 - 5.1 mEq/L 3.8   Chloride     96 - 112 mEq/L 102   CO2     19 - 32 mEq/L 26   Glucose     70 - 99 mg/dL 161 (H)   BUN     6 - 23 mg/dL 15   Creatinine     0.96 - 1.20 mg/dL 0.45   Total Bilirubin     0.2 - 1.2 mg/dL 0.4   Alkaline Phosphatase     39 - 117 U/L 88   AST     0 - 37 U/L 13   ALT     0 - 35 U/L 14   Total Protein     6.0 - 8.3 g/dL 6.1   Albumin     3.5 - 5.2 g/dL 3.9   GFR     >40.98 mL/min 66.84   Calcium     8.4 - 10.5 mg/dL 9.3   Cholesterol     0 - 200 mg/dL 119   Triglycerides     0.0 - 149.0 mg/dL 147.8 (H)   HDL Cholesterol     >39.00 mg/dL 29.56   VLDL     0.0 - 40.0 mg/dL 21.3 (H)   LDL (calc)     0 - 99 mg/dL 44   Total CHOL/HDL Ratio 3   NonHDL 87.71   Hemoglobin A1C     4.0 - 5.6 % 7.2 !   ACR is pending. The rest of the labs are at goal with the exception of high triglycerides.  Recommended to reduce fatty foods and concentrated sweets and increase fiber in the diet.  Carlus Pavlov, MD PhD [facility] Endocrinology

## 2022-10-24 LAB — LIPID PANEL
Cholesterol: 127 mg/dL (ref 0–200)
HDL: 39.4 mg/dL (ref >39.00)
LDL Cholesterol: 44 mg/dL (ref 0–99)
NonHDL: 87.71
Total CHOL/HDL Ratio: 3
Triglycerides: 219 mg/dL — ABNORMAL HIGH (ref 0.0–149.0)
VLDL: 43.8 mg/dL — ABNORMAL HIGH (ref 0.0–40.0)

## 2022-10-24 LAB — COMPREHENSIVE METABOLIC PANEL
ALT: 14 U/L (ref 0–35)
AST: 13 U/L (ref 0–37)
Albumin: 3.9 g/dL (ref 3.5–5.2)
Alkaline Phosphatase: 88 U/L (ref 39–117)
BUN: 15 mg/dL (ref 6–23)
CO2: 26 meq/L (ref 19–32)
Calcium: 9.3 mg/dL (ref 8.4–10.5)
Chloride: 102 meq/L (ref 96–112)
Creatinine, Ser: 0.97 mg/dL (ref 0.40–1.20)
GFR: 66.84 mL/min (ref >60.00)
Glucose, Bld: 146 mg/dL — ABNORMAL HIGH (ref 70–99)
Potassium: 3.8 meq/L (ref 3.5–5.1)
Sodium: 139 meq/L (ref 135–145)
Total Bilirubin: 0.4 mg/dL (ref 0.2–1.2)
Total Protein: 6.1 g/dL (ref 6.0–8.3)

## 2022-10-26 ENCOUNTER — Encounter: Payer: Self-pay | Admitting: Neurology

## 2022-11-01 NOTE — Addendum Note (Signed)
Addended by: Gertie Baron D on: 11/01/2022 04:39 PM   Modules accepted: Orders

## 2022-11-22 ENCOUNTER — Other Ambulatory Visit: Payer: Self-pay

## 2022-11-22 ENCOUNTER — Telehealth: Payer: Self-pay | Admitting: *Deleted

## 2022-11-22 ENCOUNTER — Other Ambulatory Visit: Payer: Self-pay | Admitting: *Deleted

## 2022-11-22 DIAGNOSIS — G35 Multiple sclerosis: Secondary | ICD-10-CM

## 2022-11-22 DIAGNOSIS — Z79899 Other long term (current) drug therapy: Secondary | ICD-10-CM

## 2022-11-22 NOTE — Telephone Encounter (Signed)
Placed JCV lab in [facility] lock box for routine lab pick up. Results pending. 

## 2022-11-23 LAB — CBC WITH DIFFERENTIAL/PLATELET
Basophils Absolute: 0.1 10*3/uL (ref 0.0–0.2)
Basos: 1 %
EOS (ABSOLUTE): 0.5 10*3/uL — ABNORMAL HIGH (ref 0.0–0.4)
Eos: 4 %
Hematocrit: 40.7 % (ref 34.0–46.6)
Hemoglobin: 13 g/dL (ref 11.1–15.9)
Immature Grans (Abs): 0 10*3/uL (ref 0.0–0.1)
Immature Granulocytes: 0 %
Lymphocytes Absolute: 6.1 10*3/uL — ABNORMAL HIGH (ref 0.7–3.1)
Lymphs: 52 %
MCH: 28.2 pg (ref 26.6–33.0)
MCHC: 31.9 g/dL (ref 31.5–35.7)
MCV: 88 fL (ref 79–97)
Monocytes Absolute: 0.5 10*3/uL (ref 0.1–0.9)
Monocytes: 4 %
Neutrophils Absolute: 4.7 10*3/uL (ref 1.4–7.0)
Neutrophils: 39 %
Platelets: 329 10*3/uL (ref 150–450)
RBC: 4.61 x10E6/uL (ref 3.77–5.28)
RDW: 13.9 % (ref 11.7–15.4)
WBC: 12 10*3/uL — ABNORMAL HIGH (ref 3.4–10.8)

## 2022-12-06 ENCOUNTER — Encounter: Payer: Self-pay | Admitting: Internal Medicine

## 2022-12-07 MED ORDER — FREESTYLE LIBRE 2 SENSOR MISC: 1.0000 | 3 refills | Status: DC

## 2022-12-24 ENCOUNTER — Encounter: Payer: Self-pay | Admitting: Neurology

## 2022-12-27 ENCOUNTER — Telehealth: Payer: Self-pay

## 2022-12-27 ENCOUNTER — Other Ambulatory Visit (HOSPITAL_COMMUNITY): Payer: Self-pay

## 2022-12-27 NOTE — Telephone Encounter (Signed)
Pharmacy Patient Advocate Encounter   Received notification from CoverMyMeds that prior authorization for Freestyle libre 2 sensor is required/requested.   Insurance verification completed.   The patient is insured through [facility] .   Per test claim: PA required; PA submitted to above mentioned insurance via CoverMyMeds Key/confirmation #/EOC [id number] Status is pending

## 2023-01-08 ENCOUNTER — Other Ambulatory Visit: Payer: Self-pay | Admitting: Family Medicine

## 2023-01-15 ENCOUNTER — Other Ambulatory Visit (HOSPITAL_COMMUNITY): Payer: Self-pay

## 2023-01-15 NOTE — Telephone Encounter (Addendum)
 Pharmacy Patient Advocate Encounter  Received notification from [employer] that Prior Authorization for FreeStyle Libre 2 Sensor has been APPROVED from 12-27-2022 to 12-27-2023 . Ran a test claim and the co-pay is $224.99 for a quantity of 6 for 84 days. Co-pay is $74.99 fr quantity of 2 per 28 days   PA #/Case ID/Reference #: [id number]

## 2023-01-22 ENCOUNTER — Ambulatory Visit: Payer: 59 | Admitting: Family Medicine

## 2023-01-22 ENCOUNTER — Encounter: Payer: Self-pay | Admitting: Family Medicine

## 2023-01-22 VITALS — BP 124/70 | HR 80 | Temp 97.5°F | Ht 66.0 in | Wt 223.0 lb

## 2023-01-22 DIAGNOSIS — Z7984 Long term (current) use of oral hypoglycemic drugs: Secondary | ICD-10-CM

## 2023-01-22 DIAGNOSIS — Z23 Encounter for immunization: Secondary | ICD-10-CM | POA: Diagnosis not present

## 2023-01-22 DIAGNOSIS — Z0000 Encounter for general adult medical examination without abnormal findings: Secondary | ICD-10-CM

## 2023-01-22 DIAGNOSIS — D849 Immunodeficiency, unspecified: Secondary | ICD-10-CM

## 2023-01-22 DIAGNOSIS — R350 Frequency of micturition: Secondary | ICD-10-CM

## 2023-01-22 DIAGNOSIS — Z0001 Encounter for general adult medical examination with abnormal findings: Secondary | ICD-10-CM | POA: Diagnosis not present

## 2023-01-22 DIAGNOSIS — E785 Hyperlipidemia, unspecified: Secondary | ICD-10-CM

## 2023-01-22 DIAGNOSIS — R911 Solitary pulmonary nodule: Secondary | ICD-10-CM

## 2023-01-22 DIAGNOSIS — I10 Essential (primary) hypertension: Secondary | ICD-10-CM

## 2023-01-22 DIAGNOSIS — E119 Type 2 diabetes mellitus without complications: Secondary | ICD-10-CM

## 2023-01-22 DIAGNOSIS — Z7985 Long-term (current) use of injectable non-insulin antidiabetic drugs: Secondary | ICD-10-CM

## 2023-01-22 DIAGNOSIS — Z794 Long term (current) use of insulin: Secondary | ICD-10-CM

## 2023-01-22 DIAGNOSIS — R300 Dysuria: Secondary | ICD-10-CM | POA: Diagnosis not present

## 2023-01-22 DIAGNOSIS — F325 Major depressive disorder, single episode, in full remission: Secondary | ICD-10-CM

## 2023-01-22 LAB — CBC WITH DIFFERENTIAL/PLATELET
Basophils Absolute: 0.1 10*3/uL (ref 0.0–0.1)
Basophils Relative: 0.8 % (ref 0.0–3.0)
Eosinophils Absolute: 0.8 10*3/uL — ABNORMAL HIGH (ref 0.0–0.7)
Eosinophils Relative: 5.8 % — ABNORMAL HIGH (ref 0.0–5.0)
HCT: 42.5 % (ref 36.0–46.0)
Hemoglobin: 13.9 g/dL (ref 12.0–15.0)
Lymphocytes Relative: 46.3 % — ABNORMAL HIGH (ref 12.0–46.0)
Lymphs Abs: 6.6 10*3/uL — ABNORMAL HIGH (ref 0.7–4.0)
MCHC: 32.8 g/dL (ref 30.0–36.0)
MCV: 85.7 fL (ref 78.0–100.0)
Monocytes Absolute: 0.6 10*3/uL (ref 0.1–1.0)
Monocytes Relative: 4.2 % (ref 3.0–12.0)
Neutro Abs: 6.1 10*3/uL (ref 1.4–7.7)
Neutrophils Relative %: 42.9 % — ABNORMAL LOW (ref 43.0–77.0)
Platelets: 408 10*3/uL — ABNORMAL HIGH (ref 150.0–400.0)
RBC: 4.95 Mil/uL (ref 3.87–5.11)
RDW: 15 % (ref 11.5–15.5)
WBC: 14.2 10*3/uL — ABNORMAL HIGH (ref 4.0–10.5)

## 2023-01-22 LAB — COMPREHENSIVE METABOLIC PANEL
ALT: 25 U/L (ref 0–35)
AST: 21 U/L (ref 0–37)
Albumin: 4.1 g/dL (ref 3.5–5.2)
Alkaline Phosphatase: 86 U/L (ref 39–117)
BUN: 19 mg/dL (ref 6–23)
CO2: 28 meq/L (ref 19–32)
Calcium: 9.2 mg/dL (ref 8.4–10.5)
Chloride: 99 meq/L (ref 96–112)
Creatinine, Ser: 0.89 mg/dL (ref 0.40–1.20)
GFR: 73.99 mL/min (ref >60.00)
Glucose, Bld: 144 mg/dL — ABNORMAL HIGH (ref 70–99)
Potassium: 4.1 meq/L (ref 3.5–5.1)
Sodium: 137 meq/L (ref 135–145)
Total Bilirubin: 0.4 mg/dL (ref 0.2–1.2)
Total Protein: 7 g/dL (ref 6.0–8.3)

## 2023-01-22 MED ORDER — ATORVASTATIN CALCIUM 20 MG PO TABS: 20.0000 mg | ORAL_TABLET | Freq: Every day | ORAL | 3 refills | Status: DC

## 2023-01-22 MED ORDER — AMLODIPINE BESYLATE 5 MG PO TABS: 5.0000 mg | ORAL_TABLET | Freq: Every day | ORAL | 3 refills | Status: AC

## 2023-01-22 MED ORDER — DULOXETINE HCL 60 MG PO CPEP: ORAL_CAPSULE | ORAL | 3 refills | Status: AC

## 2023-01-22 MED ORDER — CARVEDILOL 12.5 MG PO TABS: 12.5000 mg | ORAL_TABLET | Freq: Two times a day (BID) | ORAL | 3 refills | Status: DC

## 2023-01-22 NOTE — Patient Instructions (Addendum)
  Please stop by lab before you go If you have mychart- we will send your results within 3 business days of us  receiving them.  If you do not have mychart- we will call you about results within 5 business days of us  receiving them.  *please also note that you will see labs on mychart as soon as they post. I will later go in and write notes on them- will say notes from Dr. Katrinka   Would love to get you exercising- brainstorm what might work for you- even 1-2 days a week would be an awesome start  If you can find date of 2nd shingrix please let me know  Recommended follow up: Return in about 1 year (around 01/22/2024) for physical or sooner if needed.Schedule b4 you leave.

## 2023-01-22 NOTE — Progress Notes (Signed)
 Phone [phone]   Subjective:  Patient presents today for their annual physical. Chief complaint-noted.   See problem oriented charting- ROS- full  review of systems was completed and negative except for: some spotting from being off birth control accidentally, lower abdominal pain with spotting, urinary urgency and very mild burning- check urinalysis and culture if she can urinate  The following were reviewed and entered/updated in epic: Past Medical History:  Diagnosis Date   Arthritis    mild   Depression    Diabetes mellitus    Type 2   Endometriosis    GERD (gastroesophageal reflux disease)    Heart murmur    History of kidney stones    Hyperlipidemia    Hypertension    Multiple sclerosis (HCC)    Type II or unspecified type diabetes mellitus with peripheral circulatory disorders, uncontrolled(250.72) 11/27/2012   Patient Active Problem List   Diagnosis Date Noted   Pulmonary nodule 08/21/2017    Priority: High   Immunosuppressed status (HCC) 03/05/2017    Priority: High   Diabetes mellitus type II, controlled (HCC) 11/27/2012    Priority: High   Relapsing remitting multiple sclerosis (HCC) 07/05/2006    Priority: High   Nephrolithiasis 08/21/2016    Priority: Medium    Depression 02/25/2008    Priority: Medium    Hyperlipidemia 08/29/2006    Priority: Medium    Essential hypertension 07/05/2006    Priority: Medium    Gait disturbance 04/17/2016    Priority: Low   Tingling 04/17/2016    Priority: Low   Snoring 04/17/2016    Priority: Low   Urinary urgency 04/17/2016    Priority: Low   Excessive sleepiness 04/17/2016    Priority: Low   Allergic rhinitis 04/27/2014    Priority: Low   Low back pain 04/27/2014    Priority: Low   Endometriosis 07/05/2006    Priority: Low   Major depressive disorder with single episode, in full remission (HCC) 07/22/2020   Idiopathic medial aortopathy and arteriopathy (HCC) 12/02/2019   High risk medication use  04/01/2019   Class II obesity 10/09/2016   Past Surgical History:  Procedure Laterality Date   EXPLORATORY LAPAROTOMY     endometriosis   EXTRACORPOREAL SHOCK WAVE LITHOTRIPSY Right 11/19/2016   Procedure: RIGHT EXTRACORPOREAL SHOCK WAVE LITHOTRIPSY (ESWL);  Surgeon: Nieves Cough, MD;  Location: [facility];  Service: Urology;  Laterality: Right;   LUMBAR DISC SURGERY     LS spine 2005 or so   WISDOM TOOTH EXTRACTION      Family History  Problem Relation Age of Onset   Breast cancer Mother        early 3s   Diabetes Mother        maternal grandmother as well   Diabetes Father    Other Father        progressive supranuclear palsy   Colon cancer Neg Hx    Colon polyps Neg Hx    Esophageal cancer Neg Hx    Rectal cancer Neg Hx    Stomach cancer Neg Hx     Medications- reviewed and updated Current Outpatient Medications  Medication Sig Dispense Refill   azelastine (ASTELIN) 0.1 % nasal spray Place into both nostrils 2 (two) times daily. Use in each nostril as directed     Continuous Blood Gluc Receiver (FREESTYLE LIBRE 2 READER) DEVI 1 each by Does not apply route daily. 1 each 3   Continuous Glucose Sensor (FREESTYLE LIBRE 2 SENSOR) MISC 1 each by  Does not apply route every 14 (fourteen) days. 6 each 3   COVID-19 mRNA vaccine 2023-2024 (COMIRNATY ) SUSP injection Inject 0.3 ml into the muscle. 0.3 mL 0   hydrochlorothiazide  (HYDRODIURIL ) 25 MG tablet TAKE 1 TABLET BY MOUTH DAILY 90 tablet 3   hydrocortisone  (ANUSOL -HC) 25 MG suppository Place 1 suppository (25 mg total) rectally 2 (two) times daily. 12 suppository 0   insulin  glargine (LANTUS  SOLOSTAR) 100 UNIT/ML Solostar Pen inject 34 Units into the skin at bedtime. (Patient taking differently: inject 38 Units into the skin at bedtime.)     Insulin  Lispro-aabc (LYUMJEV  KWIKPEN) 200 UNIT/ML KwikPen Inject 10-20 Units into the skin 3 (three) times daily before meals. 18 mL 3   Insulin  Pen Needle 32G X 4 MM MISC Use 4x a day 400  each 3   levocetirizine (XYZAL) 5 MG tablet Take 5 mg by mouth every evening.     levonorgestrel-ethinyl estradiol (LYBREL,AMETHYST) 90-20 MCG tablet Take 1 tablet by mouth daily.      Melatonin 5 MG CAPS Take by mouth.     metFORMIN  (GLUCOPHAGE -XR) 500 MG 24 hr tablet TAKE 2 TABLETS (1,000 MG TOTAL) TWICE A DAY WITH A MEAL 360 tablet 3   modafinil  (PROVIGIL ) 200 MG tablet Take 1 tablet (200 mg total) by mouth daily as needed. 90 tablet 1   Naproxen Sodium 220 MG CAPS Take 440 mg by mouth 2 (two) times daily as needed (for pain.).      natalizumab  (TYSABRI ) 300 MG/15ML injection Inject 300 mg into the vein every 30 (thirty) days.     Probiotic Product (PROBIOTIC PO) Take by mouth.     Semaglutide , 2 MG/DOSE, (OZEMPIC , 2 MG/DOSE,) 8 MG/3ML SOPN INJECT 2 MG ONCE A WEEK AS DIRECTED 9 mL 3   amLODipine  (NORVASC ) 5 MG tablet Take 1 tablet (5 mg total) by mouth daily. 90 tablet 3   atorvastatin  (LIPITOR) 20 MG tablet Take 1 tablet (20 mg total) by mouth daily. 90 tablet 3   carvedilol  (COREG ) 12.5 MG tablet Take 1 tablet (12.5 mg total) by mouth 2 (two) times daily. 180 tablet 3   DULoxetine  (CYMBALTA ) 60 MG capsule TAKE 1 CAPSULE DAILY 90 capsule 3   No current facility-administered medications for this visit.    Allergies-reviewed and updated Allergies  Allergen Reactions   Ace Inhibitors Swelling    ANGIOEDEMA    Social History   Social History Narrative   Single. Lives alone. No children. Pet cat.       Works: transitioned in 2024 to [employer] (much smaller)- challenging job   Prior Advertising Account Planner for 23 years: [employer]      Regular exercise: not lately   Caffeine use: daily; during to the week      Hobbies: travel, genealogy , read, paper craft   Objective  Objective:  BP 124/70   Pulse 80   Temp (!) 97.5 F (36.4 C)   Ht 5' 6 (1.676 m)   Wt 223 lb (101.2 kg)   SpO2 96%   BMI 35.99 kg/m  Gen: NAD, resting comfortably HEENT: Mucous membranes are moist.  Oropharynx normal Neck: no thyromegaly CV: RRR stable murmur Lungs: CTAB no crackles, wheeze, rhonchi Abdomen: soft/nontender/nondistended/normal bowel sounds. No rebound or guarding.  Ext: no edema Skin: warm, dry Neuro: grossly normal, moves all extremities, PERRLA   Assessment and Plan   53 y.o. female presenting for annual physical.  Health Maintenance counseling: 1. Anticipatory guidance: Patient counseled regarding regular dental exams -q6  months, eye exams - just seen in august,  avoiding smoking and second hand smoke , limiting alcohol to 1 beverage per day- very infrequent- good trip to [place] in December and had a little more , no illicit drugs .   2. Risk factor reduction:  Advised patient of need for regular exercise and diet rich and fruits and vegetables to reduce risk of heart attack and stroke.  Exercise- would like to improve on this challenging with work schedule and how she feels- does get more walking on vacations- feels better when exercises.  Diet/weight management-Down 4 pounds from last visit- feels Ozempic  helpful- smaller portions/ less cravings.  Wt Readings from Last 3 Encounters:  01/22/23 223 lb (101.2 kg)  10/23/22 220 lb 9.6 oz (100.1 kg)  09/11/22 220 lb (99.8 kg)  3. Immunizations/screenings/ancillary studies-Prevnar 20 today.  Severe reaction to Shingrix but did better with the 2nd shot. Follow up and COVID in november Immunization History  Administered Date(s) Administered   Influenza Whole 10/14/2008   Influenza,inj,Quad PF,6+ Mos 11/02/2013, 10/09/2016, 10/07/2017, 10/31/2020, 10/27/2021   Influenza-Unspecified 10/05/2019, 11/30/2022   Moderna Covid-19 Fall Seasonal Vaccine 25yrs & older 11/30/2022   PFIZER Comirnaty [place] Top)Covid-19 Tri-Sucrose Vaccine 10/27/2021   PFIZER(Purple Top)SARS-COV-2 Vaccination 04/10/2019, 05/01/2019, 10/02/2019, 06/17/2020   Pfizer(Comirnaty )Fall Seasonal Vaccine 12 years and older 10/27/2021   Pneumococcal  Polysaccharide-23 01/16/2007   Tdap 01/16/2007, 08/21/2017   Zoster, Unspecified 06/17/2020   4. Cervical cancer screening- with [facility] Dr. Spero are going to get a copy- appears under media 04/06/22 5. 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 Mammograms in spring and MRI in winter for breast cancer screening 6. Colon cancer screening - 03/08/21 with 10 year repeat 7. Skin cancer screening-does not see dermatology. advised regular sunscreen use. Denies worrisome, changing, or new skin lesions.  8. Birth control/STD check- remains on birth control through GYN-likely peri or postmenopausal bleed by GYN- she came off and had significant depression- they tested her labs and was not menopausal and restarted- missed a few days this week and with spotting.  9. Osteoporosis screening at 64-plans to do this with GYN- thinks may have had one 10. Smoking associated screening -never  smoker  Status of chronic or acute concerns   #social update- work got busy and was up to 12-13 hour days through October- starting to slow down thankfully  # Multiple sclerosis-follows with Dr. Vear S:Medication: Tysabri   A/P: MS reasonably stable - continue current medications   # Diabetes- sees Dr. Trixie S: Medication:insulinshort and long-acting now, metformin  500 mg-takes 2 tablets twice daily, Ozempic  2 mg Lab Results  Component Value Date   HGBA1C 7.2 (A) 10/23/2022   HGBA1C 7.2 (A) 04/23/2022   HGBA1C 8.0 (A) 10/27/2021  A/P: a1c working towards getting under 7- continue current medications    #hypertension S: medication: Amlodipine  5 mg, carvedilol  12.5 mg twice daily, hydrochlorothiazide  25 mg BP Readings from Last 3 Encounters:  01/22/23 124/70  10/23/22 130/78  09/11/22 132/81  A/P: stable- continue current medicines   #hyperlipidemia S: Medication: Atorvastatin  20 mg Lab Results  Component Value Date   CHOL 127 10/23/2022   HDL  39.40 10/23/2022   LDLCALC 44 10/23/2022   LDLDIRECT 125.0 08/21/2017   TRIG 219.0 (H) 10/23/2022   CHOLHDL 3 10/23/2022  A/P: lipids at goal for Low Density Lipoprotein (LDL cholesterol) but triglyceride(s) slightly high- work on lifestyle/exercise to get back down   # Depression S: Medication:Cymbalta   60 mg    01/22/2023    1:31 PM 03/23/2022   11:25 AM 10/31/2020    4:05 PM  Depression screen PHQ 2/9  Decreased Interest 0 0 0  Down, Depressed, Hopeless 0 0 0  PHQ - 2 Score 0 0 0  Altered sleeping 1 1 0  Tired, decreased energy 2 2 3   Change in appetite 1 1 1   Feeling bad or failure about yourself  0 0 0  Trouble concentrating 0 0 0  Moving slowly or fidgety/restless 0 0 0  Suicidal thoughts 0 0 0  PHQ-9 Score 4 4 4   Difficult doing work/chores Somewhat difficult Somewhat difficult Not difficult at all  A/P: full remission- continue current medications    # Concern for aortic aneurysm and pulmonary nodule-on follow-up imaging no aneurysm was noted and nodules were unchanged on 01/04/2021-they recommended follow-up CT chest in 18 to 24 months- due now- refer today  -still avoid quinolones  Recommended follow up: Return in about 1 year (around 01/22/2024) for physical or sooner if needed.Schedule b4 you leave. Future Appointments  Date Time Provider Department Center  03/01/2023 11:30 AM [facility] MR 1 [id number]  03/21/2023  4:00 PM Sater, Charlie LABOR, MD [facility] None  04/02/2023  4:00 PM Trixie File, MD [id number] None    Lab/Order associations: fasting   ICD-10-CM   1. Preventative health care  Z00.00 Urinalysis, Routine w reflex microscopic    2. Controlled type 2 diabetes mellitus without complication, with long-term current use of insulin  (HCC)  E11.9 Urine Microalbumin w/creat. ratio   Z79.4 Comprehensive metabolic panel    CBC with Differential/Platelet    3. Immunosuppressed status (HCC)  D84.9     4. Essential hypertension  I10 Comprehensive metabolic  panel    CBC with Differential/Platelet    5. Hyperlipidemia, unspecified hyperlipidemia type  E78.5 Comprehensive metabolic panel    CBC with Differential/Platelet    6. Major depressive disorder with single episode, in full remission (HCC)  F32.5     7. Current use of insulin  (HCC)  Z79.4     8. Long term current use of oral hypoglycemic drug  Z79.84     9. Long-term current use of injectable noninsulin antidiabetic medication  Z79.85     10. Pulmonary nodule  R91.1 CT Chest Wo Contrast    11. Urinary frequency  R35.0 Urinalysis, Routine w reflex microscopic    Urine Culture    12. Dysuria  R30.0 Urinalysis, Routine w reflex microscopic    Urine Culture    13. Need for pneumococcal 20-valent conjugate vaccination  Z23 Pneumococcal conjugate vaccine 20-valent (Prevnar 20)      Meds ordered this encounter  Medications   DULoxetine  (CYMBALTA ) 60 MG capsule    Sig: TAKE 1 CAPSULE DAILY    Dispense:  90 capsule    Refill:  3   carvedilol  (COREG ) 12.5 MG tablet    Sig: Take 1 tablet (12.5 mg total) by mouth 2 (two) times daily.    Dispense:  180 tablet    Refill:  3   atorvastatin  (LIPITOR) 20 MG tablet    Sig: Take 1 tablet (20 mg total) by mouth daily.    Dispense:  90 tablet    Refill:  3   amLODipine  (NORVASC ) 5 MG tablet    Sig: Take 1 tablet (5 mg total) by mouth daily.    Dispense:  90 tablet    Refill:  3    Return precautions advised.  Garnette Lukes, MD

## 2023-01-23 ENCOUNTER — Other Ambulatory Visit: Payer: 59

## 2023-01-23 LAB — URINALYSIS, ROUTINE W REFLEX MICROSCOPIC
Bilirubin Urine: NEGATIVE
Ketones, ur: NEGATIVE
Leukocytes,Ua: NEGATIVE
Nitrite: NEGATIVE
Specific Gravity, Urine: 1.025 (ref 1.000–1.030)
Total Protein, Urine: NEGATIVE
Urine Glucose: >=1000 — AB
Urobilinogen, UA: 0.2 (ref 0.0–1.0)
pH: 6 (ref 5.0–8.0)

## 2023-01-23 LAB — MICROALBUMIN / CREATININE URINE RATIO
Creatinine,U: 106.6 mg/dL
Microalb Creat Ratio: 1.4 mg/g (ref 0.0–30.0)
Microalb, Ur: 1.5 mg/dL (ref 0.0–1.9)

## 2023-01-24 LAB — URINE CULTURE
MICRO NUMBER:: 15932706
Result:: NO GROWTH
SPECIMEN QUALITY:: ADEQUATE

## 2023-02-14 ENCOUNTER — Other Ambulatory Visit: Payer: Self-pay

## 2023-02-14 ENCOUNTER — Telehealth: Payer: Self-pay

## 2023-02-14 ENCOUNTER — Other Ambulatory Visit: Payer: Self-pay | Admitting: *Deleted

## 2023-02-14 DIAGNOSIS — Z79899 Other long term (current) drug therapy: Secondary | ICD-10-CM

## 2023-02-14 DIAGNOSIS — G35 Multiple sclerosis: Secondary | ICD-10-CM

## 2023-02-14 NOTE — Telephone Encounter (Signed)
Placed JCV in [facility] 02/14/2023

## 2023-02-15 LAB — CBC WITH DIFFERENTIAL/PLATELET
Basophils Absolute: 0.1 10*3/uL (ref 0.0–0.2)
Basos: 1 %
EOS (ABSOLUTE): 0.6 10*3/uL — ABNORMAL HIGH (ref 0.0–0.4)
Eos: 5 %
Hematocrit: 41 % (ref 34.0–46.6)
Hemoglobin: 13.5 g/dL (ref 11.1–15.9)
Immature Grans (Abs): 0 10*3/uL (ref 0.0–0.1)
Immature Granulocytes: 0 %
Lymphocytes Absolute: 5.7 10*3/uL — ABNORMAL HIGH (ref 0.7–3.1)
Lymphs: 47 %
MCH: 28.7 pg (ref 26.6–33.0)
MCHC: 32.9 g/dL (ref 31.5–35.7)
MCV: 87 fL (ref 79–97)
Monocytes Absolute: 0.6 10*3/uL (ref 0.1–0.9)
Monocytes: 5 %
Neutrophils Absolute: 5 10*3/uL (ref 1.4–7.0)
Neutrophils: 42 %
Platelets: 390 10*3/uL (ref 150–450)
RBC: 4.7 x10E6/uL (ref 3.77–5.28)
RDW: 13.9 % (ref 11.7–15.4)
WBC: 12.1 10*3/uL — ABNORMAL HIGH (ref 3.4–10.8)

## 2023-02-25 NOTE — Telephone Encounter (Signed)
 Kristin Mathews

## 2023-02-28 ENCOUNTER — Ambulatory Visit
Admission: RE | Admit: 2023-02-28 | Discharge: 2023-02-28 | Disposition: A | Discharge status: Home / Self Care | Payer: 59 | Source: Ambulatory Visit | Attending: Family Medicine | Admitting: Family Medicine

## 2023-02-28 DIAGNOSIS — R911 Solitary pulmonary nodule: Secondary | ICD-10-CM

## 2023-03-01 ENCOUNTER — Ambulatory Visit
Admission: RE | Admit: 2023-03-01 | Discharge: 2023-03-01 | Disposition: A | Discharge status: Home / Self Care | Payer: 59 | Source: Ambulatory Visit | Attending: Obstetrics and Gynecology | Admitting: Obstetrics and Gynecology

## 2023-03-01 DIAGNOSIS — Z9189 Other specified personal risk factors, not elsewhere classified: Secondary | ICD-10-CM

## 2023-03-01 MED ORDER — GADOPICLENOL 0.5 MMOL/ML IV SOLN: 10.0000 mL | Freq: Once | INTRAVENOUS | Status: DC | PRN

## 2023-03-01 MED ORDER — GADOPICLENOL 0.5 MMOL/ML IV SOLN
10.0000 mL | Freq: Once | INTRAVENOUS | Status: AC | PRN
  Administered 2023-03-01: 10 mL via INTRAVENOUS

## 2023-03-11 ENCOUNTER — Encounter: Payer: Self-pay | Admitting: Family Medicine

## 2023-03-21 ENCOUNTER — Encounter: Payer: Self-pay | Admitting: Neurology

## 2023-03-21 ENCOUNTER — Ambulatory Visit (INDEPENDENT_AMBULATORY_CARE_PROVIDER_SITE_OTHER): Payer: 59 | Admitting: Neurology

## 2023-03-21 VITALS — BP 144/77 | HR 79 | Ht 66.0 in | Wt 227.0 lb

## 2023-03-21 DIAGNOSIS — G4710 Hypersomnia, unspecified: Secondary | ICD-10-CM

## 2023-03-21 DIAGNOSIS — Z79899 Other long term (current) drug therapy: Secondary | ICD-10-CM | POA: Diagnosis not present

## 2023-03-21 DIAGNOSIS — E119 Type 2 diabetes mellitus without complications: Secondary | ICD-10-CM

## 2023-03-21 DIAGNOSIS — R269 Unspecified abnormalities of gait and mobility: Secondary | ICD-10-CM

## 2023-03-21 DIAGNOSIS — G35 Multiple sclerosis: Secondary | ICD-10-CM | POA: Diagnosis not present

## 2023-03-21 DIAGNOSIS — Z7985 Long-term (current) use of injectable non-insulin antidiabetic drugs: Secondary | ICD-10-CM

## 2023-03-21 MED ORDER — MODAFINIL 200 MG PO TABS: 200.0000 mg | ORAL_TABLET | Freq: Every day | ORAL | 1 refills | Status: DC | PRN

## 2023-03-21 NOTE — Progress Notes (Signed)
 [facility]  PATIENT: Kristin Mathews DOB: Sep 29, 1970  REFERRING CLINICIAN: Dr. Cato Mulligan  HISTORY FROM: Patient  REASON FOR VISIT: MS   HISTORICAL  CHIEF COMPLAINT:  Chief Complaint  Patient presents with   Follow-up    Rm11, alone,  MS: pt reports that its about the same as her last visit,  Gait disturbance: limit of 20 mins exertion then legs get weak like noodles and will fall.  Excessive sleepiness: ess score of 8    HISTORY OF PRESENT ILLNESS:  Kristin Mathews is a 53 y.o. woman with relapsing remitting MS dx 2001.  Update 03/21/23 She denies any recent exacerbations and continues on Tysabri.  She tolerates it well and has now been on for greater than 10 years..  She is JCV Ab negaive (02/14/2023 last).  However, JCV has been borderline multiple times and we reduced her from every 4 weeks to every 6 weeks..   Her last brain MRI 01/20/2018 showed an overall small plaque burden.  We have no recent MRI of the cervical spine however, previous MRIs have shown 4 or 5 plaques in the cervical and thoracic spine..  She has noted some worsening in her gait.  She has a reduced balance and gait.  She uses the banister on stairs.  She has had rare falls.  Strength is normal but if she walks far her legs start trembling some.    She reports normal sensation except for mild foot tingling (also has IDDM T2).     She has mild urinary urgency.   Vision function is ok but she has had diabetic retinopathy that is being followed closely.    She notes notes fatigue and does better after nap.    She is sleeping well most nights (6-8 hours during the work week and 12-15 hours on weekends).   Modafinil helps some.  Due to HTN prefers not to try a stimulant.   She has had a PSG in the past and she had no OSA.    She denies any difficulties with cognition.  Her mood is fine.  She has lost 30 pounds since starting Ozempic  She changed jobs and is less stressed.     She tested for BRCA and was  negative (mom had breast cancer)  MS History: She presented with unilateral numbness in May 2001 and was diagnosed with transverse myelitis.    MRI only showed one spot and an LP was non-diagnostic.   She had another episode of numbness 6 months later and MRI showed several newer lesions.    She was placed on Rebif as part of the Rebiject study.     She had an exacerbation in 2007 and in 2010 with numbness and then optic neuritis.   In 2015, she had another numbness exacerbation and we decided to switch to Tysabri.    She tolerates Tysabri well.    Imaging: MRI of the brain 01/20/2018 showed T2/flair hyperintense foci in the periventricular, juxtacortical and deep white matter.  These are consistent with chronic demyelinating plaque associated with multiple sclerosis though some of the foci are nonspecific.  MRI brain 12/29/2020 showed no new lesions  MRI cervical spine 12/29/2020 showed  T2 hyperintense foci are noted anteriorly at the cervicomedullary junction, posterior laterally to the left adjacent to C2-C3, posterolaterally to the right adjacent to C3-C4.  There is a possible subtle focus posteriorly to the left at C6-C7.   MRI thoracic spine 12/29/2020 showed T2 hyperintense focus within the  spinal cord centrally adjacent to T7-T8.  This is nonspecific but is consistent with her diagnosis of multiple sclerosis.      No degenerative changes are noted      REVIEW OF SYSTEMS:  Constitutional: No fevers, chills, sweats, or change in appetite.   Has fatigue.  Has Poor sleep Eyes: No visual changes, double vision, eye pain Ear, nose and throat: No hearing loss, ear pain, nasal congestion, sore throat Cardiovascular: No chest pain, palpitations Respiratory:  No shortness of breath at rest or with exertion.   No wheezes.   Snoring GastrointestinaI: No nausea, vomiting, diarrhea, abdominal pain.   Rare fecal incontinence Genitourinary:    She reports urgency with rare  incontinence Musculoskeletal:  No neck pain, back pain Integumentary: No rash, pruritus, skin lesions Neurological: as above Psychiatric: No depression at this time.  No anxiety Endocrine: No palpitations, diaphoresis, change in appetite, change in weigh or increased thirst Hematologic/Lymphatic:  No anemia, purpura, petechiae. Allergic/Immunologic: No itchy/runny eyes, nasal congestion, recent allergic reactions, rashes  ALLERGIES: Allergies  Allergen Reactions   Ace Inhibitors Swelling    ANGIOEDEMA    HOME MEDICATIONS: Outpatient Medications Prior to Visit  Medication Sig Dispense Refill   amLODipine (NORVASC) 5 MG tablet Take 1 tablet (5 mg total) by mouth daily. 90 tablet 3   atorvastatin (LIPITOR) 20 MG tablet Take 1 tablet (20 mg total) by mouth daily. 90 tablet 3   carvedilol (COREG) 12.5 MG tablet Take 1 tablet (12.5 mg total) by mouth 2 (two) times daily. 180 tablet 3   Continuous Blood Gluc Receiver (FREESTYLE LIBRE 2 READER) DEVI 1 each by Does not apply route daily. 1 each 3   Continuous Glucose Sensor (FREESTYLE LIBRE 2 SENSOR) MISC 1 each by Does not apply route every 14 (fourteen) days. 6 each 3   DULoxetine (CYMBALTA) 60 MG capsule TAKE 1 CAPSULE DAILY 90 capsule 3   hydrochlorothiazide (HYDRODIURIL) 25 MG tablet TAKE 1 TABLET BY MOUTH DAILY 90 tablet 3   insulin glargine (LANTUS SOLOSTAR) 100 UNIT/ML Solostar Pen inject 34 Units into the skin at bedtime. (Patient taking differently: inject 38 Units into the skin at bedtime.)     Insulin Lispro-aabc (LYUMJEV KWIKPEN) 200 UNIT/ML KwikPen Inject 10-20 Units into the skin 3 (three) times daily before meals. 18 mL 3   Insulin Pen Needle 32G X 4 MM MISC Use 4x a day 400 each 3   levocetirizine (XYZAL) 5 MG tablet Take 5 mg by mouth every evening.     levonorgestrel-ethinyl estradiol (LYBREL,AMETHYST) 90-20 MCG tablet Take 1 tablet by mouth daily.      Melatonin 5 MG CAPS Take by mouth.     metFORMIN (GLUCOPHAGE-XR) 500 MG  24 hr tablet TAKE 2 TABLETS (1,000 MG TOTAL) TWICE A DAY WITH A MEAL 360 tablet 3   Naproxen Sodium 220 MG CAPS Take 440 mg by mouth 2 (two) times daily as needed (for pain.).      natalizumab (TYSABRI) 300 MG/15ML injection Inject 300 mg into the vein every 30 (thirty) days.     Probiotic Product (PROBIOTIC PO) Take by mouth.     Semaglutide, 2 MG/DOSE, (OZEMPIC, 2 MG/DOSE,) 8 MG/3ML SOPN INJECT 2 MG ONCE A WEEK AS DIRECTED 9 mL 3   modafinil (PROVIGIL) 200 MG tablet Take 1 tablet (200 mg total) by mouth daily as needed. 90 tablet 1   azelastine (ASTELIN) 0.1 % nasal spray Place into both nostrils 2 (two) times daily. Use in each nostril as  directed     COVID-19 mRNA vaccine 2023-2024 (COMIRNATY) SUSP injection Inject 0.3 ml into the muscle. (Patient not taking: Reported on 03/21/2023) 0.3 mL 0   hydrocortisone (ANUSOL-HC) 25 MG suppository Place 1 suppository (25 mg total) rectally 2 (two) times daily. 12 suppository 0   No facility-administered medications prior to visit.    PAST MEDICAL HISTORY: Past Medical History:  Diagnosis Date   Arthritis    mild   Depression    Diabetes mellitus    Type 2   Endometriosis    GERD (gastroesophageal reflux disease)    Heart murmur    History of kidney stones    Hyperlipidemia    Hypertension    Multiple sclerosis (HCC)    Type II or unspecified type diabetes mellitus with peripheral circulatory disorders, uncontrolled(250.72) 11/27/2012    PAST SURGICAL HISTORY: Past Surgical History:  Procedure Laterality Date   EXPLORATORY LAPAROTOMY     endometriosis   EXTRACORPOREAL SHOCK WAVE LITHOTRIPSY Right 11/19/2016   Procedure: RIGHT EXTRACORPOREAL SHOCK WAVE LITHOTRIPSY (ESWL);  Surgeon: Jerilee Field, MD;  Location: [facility];  Service: Urology;  Laterality: Right;   LUMBAR DISC SURGERY     LS spine 2005 or so   WISDOM TOOTH EXTRACTION      FAMILY HISTORY: Family History  Problem Relation Age of Onset   Breast cancer Mother         early 65s   Diabetes Mother        maternal grandmother as well   Diabetes Father    Other Father        progressive supranuclear palsy   Colon cancer Neg Hx    Colon polyps Neg Hx    Esophageal cancer Neg Hx    Rectal cancer Neg Hx    Stomach cancer Neg Hx     SOCIAL HISTORY:  Social History   Socioeconomic History   Marital status: Single    Spouse name: Not on file   Number of children: Not on file   Years of education: Not on file   Highest education level: Not on file  Occupational History   Not on file  Tobacco Use   Smoking status: Never   Smokeless tobacco: Never  Substance and Sexual Activity   Alcohol use: Yes    Alcohol/week: 0.0 standard drinks of alcohol    Comment: occasional   Drug use: No   Sexual activity: Not on file  Other Topics Concern   Not on file  Social History Narrative   Single. Lives alone. No children. Pet cat.       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   General: The patient is well-developed and well-nourished and in no acute  distress   Neurologic Exam  Mental status: The patient is alert and oriented x 3 at the time of the examination. The patient has apparent normal recent and remote memory, with an apparently normal attention span and concentration ability.   Speech is normal.  Cranial  nerves: Extraocular movements are full.  Her vision and, vision is symmetric.Marland Kitchen There is mild reduced right facial sensation to soft touch.  Facial strength is normal.  Trapezius and sternocleidomastoid strength is normal. No dysarthria is noted.   no obvious hearing deficits are noted.  Motor:  Muscle bulk and tone are normal. Strength is  5 / 5 in all 4 extremities.  No foot drop.  Sensory: Sensory testing is intact to pinprick, soft touch, vibration sensation, and position sense in her hands and knees but mildly reduced vibration sensation in the toes  Coordination: Cerebellar testing reveals Good finger-to-nose and heel-to-shin bilaterally.  Gait and station: Station is normal.  The gait is mildly wide but the tandem gait is wide.  Romberg is negative.  Reflexes: Deep tendon reflexes are symmetric and normal bilaterally     DIAGNOSTIC DATA (LABS, IMAGING, TESTING) - I reviewed patient records, labs, notes, testing and imaging myself where available.  Lab Results  Component Value Date   WBC 12.1 (H) 02/14/2023   HGB 13.5 02/14/2023   HCT 41.0 02/14/2023   MCV 87 02/14/2023   PLT 390 02/14/2023      Component Value Date/Time   NA 137 01/22/2023 1415   NA 138 12/31/2017 1421   K 4.1 01/22/2023 1415   CL 99 01/22/2023 1415   CO2 28 01/22/2023 1415   GLUCOSE 144 (H) 01/22/2023 1415   BUN 19 01/22/2023 1415   BUN 14 12/31/2017 1421   CREATININE 0.89 01/22/2023 1415   CREATININE 0.73 04/30/2014 1109   CALCIUM 9.2 01/22/2023 1415   PROT 7.0 01/22/2023 1415   PROT 6.6 12/31/2017 1421   ALBUMIN 4.1 01/22/2023 1415   ALBUMIN 4.2 12/31/2017 1421   AST 21 01/22/2023 1415   ALT 25 01/22/2023 1415   ALKPHOS 86 01/22/2023 1415   BILITOT 0.4 01/22/2023 1415   BILITOT 0.2 12/31/2017 1421   GFRNONAA 78 12/31/2017 1421   GFRNONAA >89 04/30/2014 1109   GFRAA 90 12/31/2017 1421   GFRAA >89 04/30/2014 1109   Lab Results  Component Value Date   CHOL 127 10/23/2022   HDL 39.40  10/23/2022   LDLCALC 44 10/23/2022   LDLDIRECT 125.0 08/21/2017   TRIG 219.0 (H) 10/23/2022   CHOLHDL 3 10/23/2022   Lab Results  Component Value Date   HGBA1C 7.2 (A) 10/23/2022   No results found for: "VITAMINB12" Lab Results  Component Value Date   TSH 2.97 04/22/2018       ASSESSMENT AND PLAN  Relapsing remitting multiple sclerosis (HCC)  High risk medication use  Gait disturbance  Excessive sleepiness  Type 2 diabetes mellitus without complication, unspecified whether long term insulin use (HCC)   1.   Continue Tysabri.  She is JCV ab negative but always indeterminate so is doing the infusions every 6 weeks.  She has had some worsening of her disease and we need to check an MRI of the brain and spine to determine if there has been further progression.  If so, we need to consider another agent such as an anti-CD20 medication.  We also discussed the BTK inhibitors might have some benefit for progressive aspects of MS and 1 could be available next year.  There  was some liver safety issues. 2.   Stay active and exercise as tolerated.   3    Renew Provigil for hypersomnia  4.   she will return to see me in 6 months if stable or sooner if she notes other new or worsening symptoms.  Srinika Delone A. Epimenio Foot, MD, PhD 03/21/2023, 8:21 PM Certified in Neurology, Clinical Neurophysiology, Sleep Medicine, Pain Medicine and Neuroimaging  [facility] [address] [place], [place] [address] [phone]

## 2023-04-02 ENCOUNTER — Ambulatory Visit: Payer: 59 | Admitting: Internal Medicine

## 2023-04-07 ENCOUNTER — Other Ambulatory Visit: Payer: Self-pay | Admitting: Internal Medicine

## 2023-05-07 ENCOUNTER — Other Ambulatory Visit (HOSPITAL_COMMUNITY): Payer: Self-pay

## 2023-05-08 ENCOUNTER — Telehealth: Payer: Self-pay

## 2023-05-08 ENCOUNTER — Other Ambulatory Visit: Payer: Self-pay | Admitting: *Deleted

## 2023-05-08 DIAGNOSIS — Z79899 Other long term (current) drug therapy: Secondary | ICD-10-CM

## 2023-05-08 DIAGNOSIS — G35 Multiple sclerosis: Secondary | ICD-10-CM

## 2023-05-08 NOTE — Telephone Encounter (Signed)
 Pharmacy Patient Advocate Encounter   Received notification from CoverMyMeds that prior authorization for ozempic  is required/requested.   Insurance verification completed.   The patient is insured through [employer] .   Per test claim: PA required; PA submitted to above mentioned insurance via CoverMyMeds Key/confirmation #/EOC  [facility] Status is pending

## 2023-05-16 ENCOUNTER — Ambulatory Visit: Admitting: Internal Medicine

## 2023-05-16 ENCOUNTER — Encounter: Payer: Self-pay | Admitting: Internal Medicine

## 2023-05-16 VITALS — BP 120/70 | HR 89 | Ht 66.0 in | Wt 227.8 lb

## 2023-05-16 DIAGNOSIS — E785 Hyperlipidemia, unspecified: Secondary | ICD-10-CM

## 2023-05-16 DIAGNOSIS — Z794 Long term (current) use of insulin: Secondary | ICD-10-CM | POA: Diagnosis not present

## 2023-05-16 DIAGNOSIS — Z7985 Long-term (current) use of injectable non-insulin antidiabetic drugs: Secondary | ICD-10-CM

## 2023-05-16 DIAGNOSIS — E1165 Type 2 diabetes mellitus with hyperglycemia: Secondary | ICD-10-CM

## 2023-05-16 DIAGNOSIS — E1159 Type 2 diabetes mellitus with other circulatory complications: Secondary | ICD-10-CM

## 2023-05-16 DIAGNOSIS — E66812 Obesity, class 2: Secondary | ICD-10-CM | POA: Diagnosis not present

## 2023-05-16 DIAGNOSIS — Z7984 Long term (current) use of oral hypoglycemic drugs: Secondary | ICD-10-CM

## 2023-05-16 LAB — POCT GLYCOSYLATED HEMOGLOBIN (HGB A1C): Hemoglobin A1C: 7.2 % — AB (ref 4.0–5.6)

## 2023-05-16 MED ORDER — MOUNJARO 5 MG/0.5ML ~~LOC~~ SOAJ: 5.0000 mg | SUBCUTANEOUS | 3 refills | Status: DC

## 2023-05-16 NOTE — Addendum Note (Signed)
 Addended by: Vernon Goodpasture on: 05/16/2023 03:20 PM   Modules accepted: Orders

## 2023-05-16 NOTE — Progress Notes (Signed)
 Patient ID: Kristin Mathews, female   DOB: 1970/09/18, 53 y.o.   MRN: [id number]  HPI: Kristin Mathews is a 53 y.o.-year-old female, presenting for f/u for DM2, dx 2009, insulin -dependent since 2009, uncontrolled, with complications (diabetic retinopathy). Last visit 7 months ago. Since last visit, she changed her job and also her insurance >> now [employer].  Her co-pays are higher.  Interim history: No increased urination, blurry vision, nausea, chest pain. She has a little bit of stomach discomfort after taking Ozempic , but this is mild. She stopped the CeQur insulin  pump since last visit and now uses pens. She is having desserts at night: Cookies, ice cream, after dinner.  Reviewed HbA1c levels: Lab Results  Component Value Date   HGBA1C 7.2 (A) 10/23/2022   HGBA1C 7.2 (A) 04/23/2022   HGBA1C 8.0 (A) 10/27/2021   HGBA1C 7.4 (A) 09/02/2020   HGBA1C 6.9 (A) 04/08/2020   HGBA1C 7.0 (A) 10/26/2019   HGBA1C 7.4 (A) 06/29/2019   HGBA1C 8.7 (A) 03/30/2019   HGBA1C 8.4 (A) 04/22/2018   HGBA1C 7.9 (A) 10/07/2017   HGBA1C 9 01/31/2017   HGBA1C 8.3 (H) 08/21/2016   HGBA1C 7.6 01/23/2016   HGBA1C 9.0 09/26/2015   HGBA1C 8.5 (H) 02/22/2015   HGBA1C 8.2 08/03/2014   HGBA1C 8.6 (H) 04/30/2014   HGBA1C 9.3 (H) 11/02/2013   HGBA1C 7.1 (H) 04/14/2013   HGBA1C 8.0 (H) 11/12/2012   HGBA1C 7.3 (H) 05/13/2012   HGBA1C 6.8 (H) 08/21/2011   HGBA1C 6.5 11/15/2010   HGBA1C 7.3 (H) 07/04/2010   HGBA1C 8.1 (H) 02/17/2010   HGBA1C 7.3 (H) 10/13/2009   HGBA1C 7.4 (H) 06/07/2009   HGBA1C 8.4 (H) 01/18/2009   HGBA1C 6.6 (H) 06/29/2008   HGBA1C 6.6 (H) 02/25/2008  03/09/2021: HbA1c 7.3%  She prev. Had frequent steroid courses for MS. Not recently.  At last visit she was on: - Metformin  ER 1000 mg 2x a day - Victoza  1.8 mg daily in a.m. >> Ozempic  1 >> 2 mg weekly - VGo 40 - 1 click for 15g >> 10 g carbs >> 5 clicks per L and 8-10 clicks per D >> VGo30 3-4 clicks before b'fast 5-6 clicks before  lunch 9-11 clicks before dinner She was on Invokana  100 >> 300 >> had few yeast infections - stopped in 05/2013 and tried again in 2017 >> stopped again b/c yeast inf. She tried Farxiga  but stopped because yeast infection 04/2018 We stopped Humulin 70/30 25 units bid. Januvia - did not help.  Glumetza  not covered by insurance. She was previously on glipizide  XL in the morning, stopped 11/2019.  Currently on: - Metformin  ER 1000 mg 2x a day - Ozempic  2 mg weekly - Lantus  30 >> 38 units at bedtime - CeQur with Lyumjev  before meals >> now pens - ICR 4  She checks her sugars more than 4 times a day with her freestyle libre CGM (with receiver):  Previously:  Previously:  Lowest CBG: 21!! (at night) ...>> LO >> 40 >> 60s; she has hypoglycemia awareness in the 3s. Highest >300 >> .Aaron AasAaron Aas200s >> 240 >> 280.  Pt's meals are: - Breakfast: yoghurt + granola, cereals  - Lunch: sandwich, sushi, [place]  - Dinner: home cooked meal: stews, etc. - Snacks: 3-4   -No CKD, last BUN/creatinine:  Lab Results  Component Value Date   BUN 19 01/22/2023   CREATININE 0.89 01/22/2023   Lab Results  Component Value Date   MICRALBCREAT 1.4 01/23/2023   MICRALBCREAT 0.8 10/27/2021  MICRALBCREAT 2.0 11/01/2020   MICRALBCREAT 1.7 07/22/2020   MICRALBCREAT 4.3 03/30/2019   MICRALBCREAT 3.4 03/13/2018   MICRALBCREAT 9.6 08/21/2016   MICRALBCREAT 3.4 04/30/2014   MICRALBCREAT 1.1 11/12/2012   MICRALBCREAT 2.9 05/13/2012  She developed angioedema from ACE inhibitors in the past.  -+ HL; last set of lipids: Lab Results  Component Value Date   CHOL 127 10/23/2022   HDL 39.40 10/23/2022   LDLCALC 44 10/23/2022   LDLDIRECT 125.0 08/21/2017   TRIG 219.0 (H) 10/23/2022   CHOLHDL 3 10/23/2022  On pravastatin  added back in 08/2017 >> but feels hungry and "off" >> stopped.  Tried Zocor  >> she did not like how she felt on it.   Now on Lipitor 20 mg daily.  - last eye exam was on 08/28/2022: no DR.  -+  Mild numbness and tingling in her feet.  Last foot exam 10/23/2022.  She also has a history of MS (neuro Dr Hortensia Ma) - last 2 atacks: 02/2012, 2010; also HTN, fatty liver, endometriosis. She had a kidney stone in 11/2016 (calcium  oxalate)- had litotripsy. She had a total of 20-25 kidney stones throughout her life. 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  She sees cardiology. She decreased Amlodipine  2/2 itching, leg swelling. She was started on a diuretic.  Beta-blocker has been increased - summer 2022.  ROS:  I reviewed pt's medications, allergies, PMH, social hx, family hx, and changes were documented in the history of present illness. Otherwise, unchanged from my initial visit note.  Past Medical History:  Diagnosis Date   Arthritis    mild   Depression    Diabetes mellitus    Type 2   Endometriosis    GERD (gastroesophageal reflux disease)    Heart murmur    History of kidney stones    Hyperlipidemia    Hypertension    Multiple sclerosis (HCC)    Type II or unspecified type diabetes mellitus with peripheral circulatory disorders, uncontrolled(250.72) 11/27/2012   Past Surgical History:  Procedure Laterality Date   EXPLORATORY LAPAROTOMY     endometriosis   EXTRACORPOREAL SHOCK WAVE LITHOTRIPSY Right 11/19/2016   Procedure: RIGHT EXTRACORPOREAL SHOCK WAVE LITHOTRIPSY (ESWL);  Surgeon: Christina Coyer, MD;  Location: [facility];  Service: Urology;  Laterality: Right;   LUMBAR DISC SURGERY     LS spine 2005 or so   WISDOM TOOTH EXTRACTION     Social History   Socioeconomic History   Marital status: Single    Spouse name: Not on file   Number of children: Not on file   Years of education: Not on file   Highest education level: Not on file  Occupational History   Not on file  Tobacco Use   Smoking status: Never   Smokeless tobacco: Never  Substance and Sexual Activity   Alcohol use: Yes    Alcohol/week: 0.0 standard  drinks of alcohol    Comment: occasional   Drug use: No   Sexual activity: Not on file  Other Topics Concern   Not on file  Social History Narrative   Single. Lives alone. No children. Pet cat.       Works: transitioned in 2024 to [employer] (much smaller)- challenging job   Prior Advertising account planner for 23 years: [employer]       Regular exercise: not lately   Caffeine use: daily; during to the week      Hobbies: travel, genealogy , read, paper  craft   Social Drivers of Corporate investment banker Strain: Not on file  Food Insecurity: Not on file  Transportation Needs: Not on file  Physical Activity: Not on file  Stress: Not on file  Social Connections: Not on file  Intimate Partner Violence: Not on file   Current Outpatient Medications on File Prior to Visit  Medication Sig Dispense Refill   amLODipine  (NORVASC ) 5 MG tablet Take 1 tablet (5 mg total) by mouth daily. 90 tablet 3   atorvastatin  (LIPITOR) 20 MG tablet Take 1 tablet (20 mg total) by mouth daily. 90 tablet 3   carvedilol  (COREG ) 12.5 MG tablet Take 1 tablet (12.5 mg total) by mouth 2 (two) times daily. 180 tablet 3   Continuous Blood Gluc Receiver (FREESTYLE LIBRE 2 READER) DEVI 1 each by Does not apply route daily. 1 each 3   Continuous Glucose Sensor (FREESTYLE LIBRE 2 SENSOR) MISC 1 each by Does not apply route every 14 (fourteen) days. 6 each 3   DULoxetine  (CYMBALTA ) 60 MG capsule TAKE 1 CAPSULE DAILY 90 capsule 3   hydrochlorothiazide  (HYDRODIURIL ) 25 MG tablet TAKE 1 TABLET BY MOUTH DAILY 90 tablet 3   insulin  glargine (LANTUS  SOLOSTAR) 100 UNIT/ML Solostar Pen inject 38 Units into the skin at bedtime. 45 mL 2   Insulin  Lispro-aabc (LYUMJEV  KWIKPEN) 200 UNIT/ML KwikPen Inject 10-20 Units into the skin 3 (three) times daily before meals. 18 mL 3   Insulin  Pen Needle 32G X 4 MM MISC Use 4x a day 400 each 3   levocetirizine (XYZAL) 5 MG tablet Take 5 mg by mouth every evening.     levonorgestrel-ethinyl  estradiol (LYBREL,AMETHYST) 90-20 MCG tablet Take 1 tablet by mouth daily.      Melatonin 5 MG CAPS Take by mouth.     metFORMIN  (GLUCOPHAGE -XR) 500 MG 24 hr tablet TAKE 2 TABLETS (1,000 MG TOTAL) TWICE A DAY WITH A MEAL 360 tablet 3   modafinil  (PROVIGIL ) 200 MG tablet Take 1 tablet (200 mg total) by mouth daily as needed. 90 tablet 1   Naproxen Sodium 220 MG CAPS Take 440 mg by mouth 2 (two) times daily as needed (for pain.).      natalizumab  (TYSABRI ) 300 MG/15ML injection Inject 300 mg into the vein every 30 (thirty) days.     Probiotic Product (PROBIOTIC PO) Take by mouth.     Semaglutide , 2 MG/DOSE, (OZEMPIC , 2 MG/DOSE,) 8 MG/3ML SOPN INJECT 2 MG ONCE A WEEK AS DIRECTED 9 mL 3   No current facility-administered medications on file prior to visit.   Allergies  Allergen Reactions   Ace Inhibitors Swelling    ANGIOEDEMA   Family History  Problem Relation Age of Onset   Breast cancer Mother        early 68s   Diabetes Mother        maternal grandmother as well   Diabetes Father    Other Father        progressive supranuclear palsy   Colon cancer Neg Hx    Colon polyps Neg Hx    Esophageal cancer Neg Hx    Rectal cancer Neg Hx    Stomach cancer Neg Hx    PE: BP 120/70   Pulse 89   Ht 5\' 6"  (1.676 m)   Wt 227 lb 12.8 oz (103.3 kg)   SpO2 97%   BMI 36.77 kg/m   Wt Readings from Last 3 Encounters:  05/16/23 227 lb 12.8 oz (103.3 kg)  03/21/23  227 lb (103 kg)  01/22/23 223 lb (101.2 kg)   Constitutional: overweight, in NAD Eyes: EOMI, no exophthalmos ENT: no thyromegaly, no cervical lymphadenopathy Cardiovascular: RRR, No RG, +1/6 SEM Respiratory: CTA B Musculoskeletal: no deformities Skin: no rashes Neurological: no tremor with outstretched hands  ASSESSMENT: 1. DM2, insulin -dependent, uncontrolled, with complications: - DR  2. Obesity class 2  3. HL  PLAN:  1. Patient with longstanding, uncontrolled, type 2 diabetes, on metformin , GLP-1 receptor agonist  and basal-bolus insulin  regimen, with suboptimal control exacerbated by steroid courses for MS and also traveling for work.  At last visit HbA1c was 7.2%, stable.  Sugars were fluctuating within the target range mostly during the day, but increasing in the late afternoon and after dinner.  She did relax her diet and we discussed about improving this.  We reduced the dose of Lantus  due to lower blood sugars throughout the day, and we continued Lyumjev  based on an insulin  to carb ratio of 1:4. - We had previous insurance, neither VGo or the CeQur simplicity pump were covered while OmniPod was.  After changing insurance, CeQur simplicity system was covered.  She started this before our last visit but she did not like it too much and was interested in switching back to pens.  She is now using pens. CGM interpretation: -At today's visit, we reviewed her CGM downloads: It appears that 79% of values are in target range (goal >70%), while 21% are higher than 180 (goal <25%), and 0% are lower than 70 (goal <4%).  The calculated average blood sugar is 145.  The projected HbA1c for the next 3 months (GMI) is 6.8%. -Reviewing the CGM trends, sugars appear to be better controlled during the day, but they increase significantly at night.  She attributes the increase in eating sweets after dinner including cookies and icecream.  We discussed about healthier alternatives, but I did advise her that if she does have desserts she will probably need few units more of insulin  before the meal.  We also discussed about possibly switching to Mounjaro for stronger effect on blood sugars.  I did recommend 5 mg weekly if her insurance covers it.  We can continue the rest of the regimen. -  I advised her to:  Patient Instructions  Please continue: - Metformin  ER 1000 mg 2x a day - Ozempic  2 mg weekly/Mounjaro 5 mg weekly - Lantus  35-36 units at bedtime - Lyumjev  pens before meals - insulin  to carb ratio 1:4 Add few units of insulin   to account for dessert.  Please return in 4-6 months.  - we checked her HbA1c: 7.2% (stable) - advised to check sugars at different times of the day - 4x a day, rotating check times - advised for yearly eye exams >> she is UTD - return to clinic in 4-6 months  2. Obesity class 2 - She continues Ozempic  which should help with weight loss, also - She lost 9 pounds before the last 2 visits combined - Weight is stable at today's visit  3. HL - Latest lipid panel reviewed: LDL at goal, triglycerides elevated: Lab Results  Component Value Date   CHOL 127 10/23/2022   HDL 39.40 10/23/2022   LDLCALC 44 10/23/2022   LDLDIRECT 125.0 08/21/2017   TRIG 219.0 (H) 10/23/2022   CHOLHDL 3 10/23/2022  -She tried pravastatin  and simvastatin  but she did not like how this made her feel.  However, she is now tolerating well Lipitor 20 mg daily  Tou Hayner, MD  PhD [facility] Endocrinology

## 2023-05-16 NOTE — Patient Instructions (Addendum)
 Please continue: - Metformin  ER 1000 mg 2x a day - Ozempic  2 mg weekly/Mounjaro 5 mg weekly - Lantus  35-36 units at bedtime - Lyumjev  pens before meals - insulin  to carb ratio 1:4 Add few units of insulin  to account for dessert.  Please return in 4-6 months.

## 2023-05-21 NOTE — Telephone Encounter (Signed)
 Pharmacy Patient Advocate Encounter  Received notification from [employer] that Prior Authorization for Ozempic  has been APPROVED through 05/07/2024   PA #/Case ID/Reference #: [id number]

## 2023-05-22 ENCOUNTER — Other Ambulatory Visit: Payer: Self-pay | Admitting: *Deleted

## 2023-05-22 ENCOUNTER — Other Ambulatory Visit: Payer: Self-pay

## 2023-05-22 ENCOUNTER — Telehealth: Payer: Self-pay | Admitting: *Deleted

## 2023-05-22 DIAGNOSIS — Z79899 Other long term (current) drug therapy: Secondary | ICD-10-CM

## 2023-05-22 DIAGNOSIS — G35 Multiple sclerosis: Secondary | ICD-10-CM

## 2023-05-22 NOTE — Telephone Encounter (Signed)
 Placed JVC in [facility] 05/22/2023

## 2023-05-23 LAB — CBC WITH DIFFERENTIAL/PLATELET
Basophils Absolute: 0.1 10*3/uL (ref 0.0–0.2)
Basos: 1 %
EOS (ABSOLUTE): 0.7 10*3/uL — ABNORMAL HIGH (ref 0.0–0.4)
Eos: 6 %
Hematocrit: 42.2 % (ref 34.0–46.6)
Hemoglobin: 13.4 g/dL (ref 11.1–15.9)
Immature Grans (Abs): 0 10*3/uL (ref 0.0–0.1)
Immature Granulocytes: 0 %
Lymphocytes Absolute: 4.5 10*3/uL — ABNORMAL HIGH (ref 0.7–3.1)
Lymphs: 40 %
MCH: 27.3 pg (ref 26.6–33.0)
MCHC: 31.8 g/dL (ref 31.5–35.7)
MCV: 86 fL (ref 79–97)
Monocytes Absolute: 0.5 10*3/uL (ref 0.1–0.9)
Monocytes: 4 %
Neutrophils Absolute: 5.5 10*3/uL (ref 1.4–7.0)
Neutrophils: 49 %
Platelets: 425 10*3/uL (ref 150–450)
RBC: 4.91 x10E6/uL (ref 3.77–5.28)
RDW: 14.1 % (ref 11.7–15.4)
WBC: 11.3 10*3/uL — ABNORMAL HIGH (ref 3.4–10.8)

## 2023-05-28 ENCOUNTER — Other Ambulatory Visit: Payer: Self-pay | Admitting: Internal Medicine

## 2023-06-12 ENCOUNTER — Other Ambulatory Visit: Payer: Self-pay | Admitting: Internal Medicine

## 2023-06-12 NOTE — Telephone Encounter (Signed)
 Requested Prescriptions   Pending Prescriptions Disp Refills   Semaglutide , 2 MG/DOSE, (OZEMPIC , 2 MG/DOSE,) 8 MG/3ML SOPN [Pharmacy Med Name: OZEMPIC   2MG  SOLUTION PEN-INJECTOR  PEN DOSE] 9 mL 3    Sig: INJECT SUBCUTANEOUSLY 2 MG EVERY WEEK AS DIRECTED

## 2023-06-15 ENCOUNTER — Other Ambulatory Visit: Payer: Self-pay | Admitting: Internal Medicine

## 2023-06-18 NOTE — Telephone Encounter (Signed)
 Received message via epic that pt had not read mychart. I called and LVM relaying mychart message. Asked her to call back if she has any more questions.

## 2023-07-22 ENCOUNTER — Other Ambulatory Visit: Payer: Self-pay | Admitting: Internal Medicine

## 2023-07-22 NOTE — Telephone Encounter (Signed)
 Refill request complete

## 2023-07-29 ENCOUNTER — Ambulatory Visit (INDEPENDENT_AMBULATORY_CARE_PROVIDER_SITE_OTHER): Admitting: Family Medicine

## 2023-07-29 ENCOUNTER — Ambulatory Visit: Payer: Self-pay

## 2023-07-29 ENCOUNTER — Encounter: Payer: Self-pay | Admitting: Family Medicine

## 2023-07-29 VITALS — BP 131/86 | HR 83 | Temp 97.8°F | Ht 66.0 in | Wt 221.0 lb

## 2023-07-29 DIAGNOSIS — R109 Unspecified abdominal pain: Secondary | ICD-10-CM | POA: Diagnosis not present

## 2023-07-29 DIAGNOSIS — E119 Type 2 diabetes mellitus without complications: Secondary | ICD-10-CM

## 2023-07-29 DIAGNOSIS — D849 Immunodeficiency, unspecified: Secondary | ICD-10-CM

## 2023-07-29 DIAGNOSIS — Z794 Long term (current) use of insulin: Secondary | ICD-10-CM

## 2023-07-29 DIAGNOSIS — E860 Dehydration: Secondary | ICD-10-CM | POA: Diagnosis not present

## 2023-07-29 DIAGNOSIS — R197 Diarrhea, unspecified: Secondary | ICD-10-CM

## 2023-07-29 DIAGNOSIS — Z87442 Personal history of urinary calculi: Secondary | ICD-10-CM

## 2023-07-29 NOTE — Patient Instructions (Signed)
 Please follow up if symptoms do not improve or as needed.     VISIT SUMMARY: Today, you were seen for diarrhea and nausea that started last week, along with a history of kidney stones and multiple sclerosis. You experienced back pain, fever, and leg weakness, which have since improved. Your diarrhea has been persistent but is now less frequent. You also reported flank pain similar to previous kidney stone episodes. Your blood sugar levels have been lower than usual during this illness.  YOUR PLAN: -ACUTE GASTROENTERITIS: Acute gastroenteritis is an inflammation of the stomach and intestines, often caused by a viral infection, leading to symptoms like diarrhea and nausea. You should start taking loperamide for diarrhea, stay hydrated with water and electrolyte solutions, and use the provided stool study kit if your symptoms persist.  -SUSPECTED NEPHROLITHIASIS: Nephrolithiasis refers to kidney stones, which can cause flank pain. A urinalysis has been ordered to check for blood and infection in your urine.  -TYPE 2 DIABETES MELLITUS: Type 2 diabetes is a condition that affects how your body processes blood sugar. Due to your current illness and lower food intake, your blood sugar levels have been lower. You should hold off on taking metformin , semaglutide , and hydrochlorothiazide  until you are eating normally again. Monitor your blood glucose levels and adjust your insulin  glargine dosing if needed.  -MULTIPLE SCLEROSIS: Multiple sclerosis is a disease that affects the central nervous system, causing symptoms like leg weakness. Your recent leg weakness was likely due to your fever and has resolved as the fever went away.  INSTRUCTIONS: Please follow up in 48-72 hours to monitor your symptoms and prevent any complications.                      Contains text generated by [employer]bridge.                                 Contains text generated by Abridge.

## 2023-07-29 NOTE — Progress Notes (Signed)
 Subjective  CC:  Chief Complaint  Patient presents with   Diarrhea    Pt stated th she has had diarrhea since last Wednesday but it has gotten better but not gone    HPI: Kristin Mathews is a 53 y.o. female who presents to the office today to address the problems listed above in the chief complaint. Discussed the use of AI scribe software for clinical note transcription with the patient, who gave verbal consent to proceed.  History of Present Illness Kristin Mathews is a 53 year old female with a history of kidney stones and multiple sclerosis who presents with diarrhea and nausea.  She experienced back pain on Tuesday of last week, initially attributing it to a kidney stone due to her long history with them, although it has been eight years since her last occurrence.  The following day, she developed nausea, diarrhea, and fever. The fever and heat exacerbated her multiple sclerosis symptoms, causing weakness in her legs and difficulty moving around. The fever and weakness resolved by the next night.  The diarrhea has been persistent, with up to 20 episodes in a day initially. Although the frequency has decreased, she still experiences watery diarrhea, with three episodes occurring today. The diarrhea seemed to improve yesterday with more formed stools but returned to watery consistency this morning. No blood or mucus in the stool and only mild abdominal cramping. Nausea returned today, although she denies vomiting. No recent travel, dietary changes, or contact with others who are ill. Concerned about dehydration but is able to drink fluids and urinate normally. Reports decreased appetite and dry mouth.  She mentions experiencing flank pain again this morning, similar to her previous kidney stone episodes, but denies any blood in her urine. She has not taken any medication for the diarrhea and has not left her house since last Wednesday.  She has a past medical history of diabetes, and her  blood sugar levels have been lower than usual during this illness. She has not been on any recent antibiotics and has no history of diverticular disease, although her sister has it.   Assessment  1. Diarrhea of presumed infectious origin   2. Left flank pain   3. History of kidney stones   4. Mild dehydration   5. Immunosuppressed status (HCC)   6. Insulin -requiring or dependent type II diabetes mellitus (HCC)      Plan  Assessment and Plan Assessment & Plan Acute Gastroenteritis Symptoms suggest viral gastroenteritis with persistent diarrhea and nausea. Mild Dehydration is a concern but no sxs are active. Bacterial infection remains a differential diagnosis. - Start loperamide for diarrhea. - Encourage hydration with water and electrolyte solutions. - Provide stool study kit for bacterial infection testing if symptoms persist.  Possible Nephrolithiasis or UTI Flank pain suggests nephrolithiasis. Urinalysis needed to rule out hematuria or infection. - Order urinalysis to check for blood and infection.  Type 2 Diabetes Mellitus Blood glucose levels are lower due to decreased intake and possible infection. Risk of hypoglycemia with current medication regimen. - Hold metformin , semaglutide , and hydrochlorothiazide  until normal intake resumes. - Monitor blood glucose levels. - Adjust insulin  glargine dosing if needed.  Multiple Sclerosis Transient leg weakness likely related to febrile illness, resolved with fever resolution.  Follow-up Follow-up needed to monitor symptom resolution and prevent complications. - Follow up in 48-72 hours.    Follow up: as above Orders Placed This Encounter  Procedures   Urine Culture   Gastrointestinal Pathogen Pnl RT,  PCR   Urinalysis, Routine w reflex microscopic   No orders of the defined types were placed in this encounter.    I reviewed the patients updated PMH, FH, and SocHx.  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/02/2019   High risk medication use 04/01/2019   Pulmonary nodule 08/21/2017   Immunosuppressed status (HCC) 03/05/2017   Class II obesity 10/09/2016   Nephrolithiasis 08/21/2016   Gait disturbance 04/17/2016   Tingling 04/17/2016   Snoring 04/17/2016   Urinary urgency 04/17/2016   Excessive sleepiness 04/17/2016   Allergic rhinitis 04/27/2014   Low back pain 04/27/2014   Diabetes mellitus type II, controlled (HCC) 11/27/2012   Depression 02/25/2008   Hyperlipidemia 08/29/2006   Relapsing remitting multiple sclerosis (HCC) 07/05/2006   Essential hypertension 07/05/2006   Endometriosis 07/05/2006   Current Meds  Medication Sig   amLODipine  (NORVASC ) 5 MG tablet Take 1 tablet (5 mg total) by mouth daily.   atorvastatin  (LIPITOR) 20 MG tablet Take 1 tablet (20 mg total) by mouth daily.   carvedilol  (COREG ) 12.5 MG tablet Take 1 tablet (12.5 mg total) by mouth 2 (two) times daily.   Continuous Blood Gluc Receiver (FREESTYLE LIBRE 2 READER) DEVI 1 each by Does not apply route daily.   Continuous Glucose Sensor (FREESTYLE LIBRE 2 SENSOR) MISC 1 each by Does not apply route every 14 (fourteen) days.   DULoxetine  (CYMBALTA ) 60 MG capsule TAKE 1 CAPSULE DAILY   hydrochlorothiazide  (HYDRODIURIL ) 25 MG tablet TAKE 1 TABLET BY MOUTH DAILY   insulin  glargine (LANTUS  SOLOSTAR) 100 UNIT/ML Solostar Pen inject 38 Units into the skin at bedtime.   Insulin  Lispro-aabc (LYUMJEV  KWIKPEN) 200 UNIT/ML KwikPen INJECT 10-20 UNITS INTO THE SKIN 3 (THREE) TIMES DAILY BEFORE MEALS.   Insulin  Pen Needle 32G X 4 MM MISC Use 4x a day   levocetirizine (XYZAL) 5 MG tablet Take 5 mg by mouth every evening.   levonorgestrel-ethinyl estradiol (LYBREL,AMETHYST) 90-20 MCG tablet Take 1 tablet by mouth daily.    Melatonin 5 MG CAPS Take by mouth.   metFORMIN  (GLUCOPHAGE -XR) 500 MG  24 hr tablet TAKE 2 TABLETS BY MOUTH TWICE  DAILY WITH MEALS   modafinil  (PROVIGIL ) 200 MG tablet Take 1 tablet (200 mg total) by mouth daily as needed.   Naproxen Sodium 220 MG CAPS Take 440 mg by mouth 2 (two) times daily as needed (for pain.).    natalizumab  (TYSABRI ) 300 MG/15ML injection Inject 300 mg into the vein every 30 (thirty) days.   Probiotic Product (PROBIOTIC PO) Take by mouth.   Semaglutide , 2 MG/DOSE, (OZEMPIC , 2 MG/DOSE,) 8 MG/3ML SOPN INJECT SUBCUTANEOUSLY 2 MG EVERY WEEK AS DIRECTED   tirzepatide  (MOUNJARO ) 5 MG/0.5ML Pen Inject 5 mg into the skin once a week.   Allergies: Patient is allergic to ace inhibitors. Family History: Patient family history includes Breast cancer in her mother; Diabetes in her father and mother; Other in her father. Social History:  Patient  reports that she has never smoked. She has never used smokeless tobacco. She reports current alcohol use. She reports that she does not use drugs.  Review of Systems: Constitutional: Negative for fever malaise or anorexia Cardiovascular: negative for chest pain Respiratory: negative for SOB or persistent cough Gastrointestinal: negative for abdominal pain  Objective  Vitals: BP 131/86   Pulse 83   Temp 97.8 F (36.6 C)  Ht 5' 6 (1.676 m)   Wt 221 lb (100.2 kg)   SpO2 95%   BMI 35.67 kg/m  General: no acute distress , A&Ox3 HEENT: PEERL, conjunctiva normal, neck is supple, mild dry oral mucosa Cardiovascular:  RRR without murmur or gallop.  Respiratory:  Good breath sounds bilaterally, CTAB with normal respiratory effort Gastrointestinal: soft, flat abdomen, normal active bowel sounds, no palpable masses, no hepatosplenomegaly, no appreciated hernias Mild left CVAT Skin:  Warm, no rashes Commons side effects, risks, benefits, and alternatives for medications and treatment plan prescribed today were discussed, and the patient expressed understanding of the given instructions. Patient is  instructed to call or message via MyChart if he/she has any questions or concerns regarding our treatment plan. No barriers to understanding were identified. We discussed Red Flag symptoms and signs in detail. Patient expressed understanding regarding what to do in case of urgent or emergency type symptoms.  Medication list was reconciled, printed and provided to the patient in AVS. Patient instructions and summary information was reviewed with the patient as documented in the AVS. This note was prepared with assistance of Dragon voice recognition software. Occasional wrong-word or sound-a-like substitutions may have occurred due to the inherent limitations of voice recognition software

## 2023-07-29 NOTE — Telephone Encounter (Signed)
 FYI Only or Action Required?: FYI only for provider.  Patient was last seen in primary care on 01/22/2023 by Katrinka Garnette KIDD, MD.  Called Nurse Triage reporting GI Problem.  Symptoms began a week ago.  Interventions attempted: Rest, hydration, or home remedies.  Symptoms are: gradually improving.  Triage Disposition: See Physician Within 24 Hours  Patient/caregiver understands and will follow disposition?: Yes, will follow disposition  Copied from CRM [id number]. Topic: Clinical - Red Word Triage >> Jul 29, 2023 10:21 AM Robinson H wrote: [facility] that prompted transfer to Nurse Triage: Stomach issue, diarrhea, nausea, back pain on left side 6 days Reason for Disposition  [1] SEVERE diarrhea (e.g., 7 or more times / day more than normal) AND [2] present > 24 hours (1 day)  Answer Assessment - Initial Assessment Questions 1. DIARRHEA SEVERITY: How bad is the diarrhea? How many more stools have you had in the past 24 hours than normal?      3 2. ONSET: When did the diarrhea begin?      6 days ago 3. STOOL DESCRIPTION:  How loose or watery is the diarrhea? What is the stool color? Is there any blood or mucous in the stool?     Denies blood, mucus. Loose and watery 4. VOMITING: Are you also vomiting? If Yes, ask: How many times in the past 24 hours?      denies 5. ABDOMEN PAIN: Are you having any abdomen pain? If Yes, ask: What does it feel like? (e.g., crampy, dull, intermittent, constant)      Denies abd pain, states she is having L flank pain 6. ABDOMEN PAIN SEVERITY: If present, ask: How bad is the pain?  (e.g., Scale 1-10; mild, moderate, or severe)     Flank pain-2 7. ORAL INTAKE: If vomiting, Have you been able to drink liquids? How much liquids have you had in the past 24 hours?     States she feels like she is drinking enough water, but states that she could be dehydrated with the amount of water she has lost in the last 6 days.  8. HYDRATION: Any  signs of dehydration? (e.g., dry mouth [not just dry lips], too weak to stand, dizziness, new weight loss) When did you last urinate?     States little bit of dizziness 9. EXPOSURE: Have you traveled to a foreign country recently? Have you been exposed to anyone with diarrhea? Could you have eaten any food that was spoiled?     denies 10. ANTIBIOTIC USE: Are you taking antibiotics now or have you taken antibiotics in the past 2 months?       denies 11. OTHER SYMPTOMS: Do you have any other symptoms? (e.g., fever, blood in stool)       Denies fever  Protocols used: Diarrhea-A-AH

## 2023-07-30 ENCOUNTER — Encounter: Payer: Self-pay | Admitting: Family Medicine

## 2023-07-30 LAB — URINE CULTURE
MICRO NUMBER:: 16694844
SPECIMEN QUALITY:: ADEQUATE

## 2023-07-30 MED ORDER — TRAMADOL HCL 50 MG PO TABS: 50.0000 mg | ORAL_TABLET | Freq: Four times a day (QID) | ORAL | 0 refills | Status: DC | PRN

## 2023-07-31 ENCOUNTER — Other Ambulatory Visit

## 2023-07-31 ENCOUNTER — Ambulatory Visit: Payer: Self-pay | Admitting: Family Medicine

## 2023-07-31 DIAGNOSIS — G35 Multiple sclerosis: Secondary | ICD-10-CM

## 2023-08-02 ENCOUNTER — Encounter: Payer: Self-pay | Admitting: Family Medicine

## 2023-08-04 LAB — GASTROINTESTINAL PATHOGEN PNL
CampyloBacter Group: NOT DETECTED
Norovirus GI/GII: DETECTED — AB
Rotavirus A: NOT DETECTED
Salmonella species: DETECTED — AB
Shiga Toxin 1: NOT DETECTED
Shiga Toxin 2: NOT DETECTED
Shigella Species: NOT DETECTED
Vibrio Group: NOT DETECTED
Yersinia enterocolitica: NOT DETECTED

## 2023-08-04 LAB — ISOLATE REFERRAL PH
MICRO NUMBER:: 16721750
SPECIMEN QUALITY:: ADEQUATE

## 2023-08-05 MED ORDER — CIPROFLOXACIN HCL 500 MG PO TABS: 500.0000 mg | ORAL_TABLET | Freq: Two times a day (BID) | ORAL | 0 refills | Status: AC

## 2023-08-05 NOTE — Progress Notes (Signed)
 See mychart note Dear Ms. Hendler, As you are now aware, your stool shows a viral and bacterial infection of the colon. Both types of infections typically clear on their own; however, given your medical history, if you are still having diarrhea, I recommend taking an antibiotic to help clear the salmonella infection. I have ordered cipro  to take twice a day for 5 days. Continue to hydrate and rest.  Sincerely, Dr. Jodie

## 2023-08-15 ENCOUNTER — Telehealth: Payer: Self-pay | Admitting: *Deleted

## 2023-08-15 ENCOUNTER — Other Ambulatory Visit (INDEPENDENT_AMBULATORY_CARE_PROVIDER_SITE_OTHER): Payer: Self-pay

## 2023-08-15 DIAGNOSIS — Z0289 Encounter for other administrative examinations: Secondary | ICD-10-CM

## 2023-08-15 NOTE — Telephone Encounter (Signed)
 Placed [facility] lab in [facility] lock box for routine lab pick up. Results pending.

## 2023-08-22 NOTE — Telephone Encounter (Signed)
 SABRA

## 2023-09-18 ENCOUNTER — Ambulatory Visit: Admitting: Family Medicine

## 2023-09-18 ENCOUNTER — Encounter: Payer: Self-pay | Admitting: Family Medicine

## 2023-09-18 VITALS — BP 137/86 | HR 83 | Temp 97.9°F | Ht 66.0 in | Wt 211.4 lb

## 2023-09-18 DIAGNOSIS — R197 Diarrhea, unspecified: Secondary | ICD-10-CM | POA: Diagnosis not present

## 2023-09-18 DIAGNOSIS — A029 Salmonella infection, unspecified: Secondary | ICD-10-CM

## 2023-09-18 DIAGNOSIS — Z7985 Long-term (current) use of injectable non-insulin antidiabetic drugs: Secondary | ICD-10-CM

## 2023-09-18 DIAGNOSIS — E118 Type 2 diabetes mellitus with unspecified complications: Secondary | ICD-10-CM

## 2023-09-18 DIAGNOSIS — Z23 Encounter for immunization: Secondary | ICD-10-CM | POA: Diagnosis not present

## 2023-09-18 DIAGNOSIS — I10 Essential (primary) hypertension: Secondary | ICD-10-CM

## 2023-09-18 LAB — CBC WITH DIFFERENTIAL/PLATELET
Basophils Absolute: 0.1 K/uL (ref 0.0–0.1)
Basophils Relative: 1 % (ref 0.0–3.0)
Eosinophils Absolute: 0.4 K/uL (ref 0.0–0.7)
Eosinophils Relative: 5.4 % — ABNORMAL HIGH (ref 0.0–5.0)
HCT: 42 % (ref 36.0–46.0)
Hemoglobin: 13.5 g/dL (ref 12.0–15.0)
Lymphocytes Relative: 43.3 % (ref 12.0–46.0)
Lymphs Abs: 3.2 K/uL (ref 0.7–4.0)
MCHC: 32.1 g/dL (ref 30.0–36.0)
MCV: 85.8 fl (ref 78.0–100.0)
Monocytes Absolute: 0.4 K/uL (ref 0.1–1.0)
Monocytes Relative: 5.8 % (ref 3.0–12.0)
Neutro Abs: 3.3 K/uL (ref 1.4–7.7)
Neutrophils Relative %: 44.5 % (ref 43.0–77.0)
Platelets: 207 K/uL (ref 150.0–400.0)
RBC: 4.89 Mil/uL (ref 3.87–5.11)
RDW: 17.2 % — ABNORMAL HIGH (ref 11.5–15.5)
WBC: 7.4 K/uL (ref 4.0–10.5)

## 2023-09-18 LAB — COMPLETE METABOLIC PANEL WITHOUT GFR
AG Ratio: 1.8 (calc) (ref 1.0–2.5)
ALT: 137 U/L — ABNORMAL HIGH (ref 6–29)
AST: 94 U/L — ABNORMAL HIGH (ref 10–35)
Albumin: 4.2 g/dL (ref 3.6–5.1)
Alkaline phosphatase (APISO): 116 U/L (ref 37–153)
BUN/Creatinine Ratio: 16 (calc) (ref 6–22)
BUN: 17 mg/dL (ref 7–25)
CO2: 27 mmol/L (ref 20–32)
Calcium: 9.2 mg/dL (ref 8.6–10.4)
Chloride: 102 mmol/L (ref 98–110)
Creat: 1.04 mg/dL — ABNORMAL HIGH (ref 0.50–1.03)
Globulin: 2.3 g/dL (ref 1.9–3.7)
Glucose, Bld: 160 mg/dL — ABNORMAL HIGH (ref 65–99)
Potassium: 3.7 mmol/L (ref 3.5–5.3)
Sodium: 141 mmol/L (ref 135–146)
Total Bilirubin: 0.8 mg/dL (ref 0.2–1.2)
Total Protein: 6.5 g/dL (ref 6.1–8.1)

## 2023-09-18 LAB — SEDIMENTATION RATE: Sed Rate: 5 mm/h (ref 0–30)

## 2023-09-18 LAB — TSH: TSH: 3.27 u[IU]/mL (ref 0.35–5.50)

## 2023-09-18 NOTE — Progress Notes (Signed)
 Subjective  CC:  Chief Complaint  Patient presents with   Diarrhea    Pt stated that she was dx with the noro virus and salmonella back in July and she is still experiencing symptoms from it like diarrhea.    HPI: Kristin Mathews is a 53 y.o. female who presents to the office today to address the problems listed above in the chief complaint. Discussed the use of AI scribe software for clinical note transcription with the patient, who gave verbal consent to proceed.  History of Present Illness Kristin Mathews is a 53 year old female with multiple sclerosis and diabetes who presents with chronic diarrhea and gastrointestinal issues.  I evaluated her in July and diagnosed her with norovirus and Salmonella.  She was treated supportively, never took Cipro .  She is back now with persistent symptoms.  She has experienced chronic diarrhea and gastrointestinal issues for over 20 years, with bowel movements that are variable and often urgent. She describes them as 'urgent sometimes' and 'blue swattering,' meaning she sometimes has to rush to the bathroom. These symptoms were present before her recent infections with norovirus and salmonella, diagnosed during her last visit.  She has been attempting to identify potential triggers for her symptoms. She initially suspected metformin  as a trigger, noting some improvement when she stopped taking it, but symptoms persisted. She also experimented with a gluten-free diet after severe symptoms following the consumption of tomato sandwiches, which seemed to help slightly. Additionally, she noticed an improvement when she accidentally stopped taking carvedilol , but symptoms worsened upon resuming the medication.  Her current medications include Ozempic  and metformin  for diabetes management. She has been on Ozempic  for two to three years and metformin  for an unspecified duration. She also takes Fortify probiotics nightly, which she has been using for about a year. She  reports a recent weight loss of ten pounds, which she attributes to her ongoing gastrointestinal issues.  In terms of her diabetes management, her last A1c was 7.1. She has not been checking her blood sugars recently. She has an endocrinologist who manages her diabetes care.  She has a history of multiple medical conditions, including multiple sclerosis and diabetes, which she believes have impacted her bowel health. She has previously consulted a gastroenterologist and had a colonoscopy two years ago, but did not pursue further investigation at that time.   Assessment  1. Diarrhea, unspecified type   2. Need for influenza vaccination   3. Salmonella   4. Controlled type 2 diabetes mellitus with complication, without long-term current use of insulin  (HCC)   5. Essential hypertension      Plan  Assessment and Plan Assessment & Plan Chronic diarrhea with urgency and weight loss, likely multifactorial (history of norovirus and salmonella gastroenteritis, postinfectious complications like SIBO, medication-induced, underlying irritable bowel syndrome) Chronic diarrhea with urgency and weight loss, likely multifactorial. Contributing factors include norovirus and salmonella gastroenteritis, medication-induced effects, and underlying irritable bowel syndrome. Differential diagnosis includes possible C. difficile infection or other gastrointestinal issues. Previous colonoscopy was unremarkable, further evaluation by gastroenterology may be warranted.  She saw Dr. Stacia in the past. - Order stool studies to rule out C. difficile and other infections. - Hold metformin  for six weeks to assess impact on gastrointestinal symptoms. - Reduce Ozempic  dose to 1 mg for several weeks. - Continue probiotics (Fortify). - Use Imodium as needed to manage diarrhea. - Refer to primary care physician for further management and potential referral to gastroenterology.  Type 2  diabetes mellitus Type 2 diabetes  mellitus with recent A1c of 7.1. Current management includes Ozempic  and metformin , though metformin  is being held to assess its impact on gastrointestinal symptoms. - Hold metformin  for six weeks. - Reduce Ozempic  dose to 1 mg for several weeks. - Monitor blood glucose levels regularly.  Monitor diet closely. - Coordinate diabetes management with endocrinologist.  Hypertension Hypertension managed with carvedilol , currently held due to gastrointestinal side effects. Blood pressure remains well-controlled despite holding medication. - Continue to hold carvedilol  as long as blood pressure remains well-controlled. - Monitor blood pressure regularly. - Coordinate hypertension management with primary care physician.  Follow-Up Follow-up is necessary to monitor progress and adjust treatment plans as needed. - Follow up with primary care physician for further management and potential referral to gastroenterology. - Check MyChart for stool study results or expect a call with results. - Consider follow-up with gastroenterologist if symptoms persist.   Orders Placed This Encounter  Procedures   Calprotectin, Fecal   Clostridium difficile Toxin B, Qualitative, Real-Time PCR   Flu vaccine trivalent PF, 6mos and older(Flulaval,Afluria,Fluarix,Fluzone)   CBC with Differential/Platelet   Gastrointestinal Pathogen Pnl RT, PCR   Sedimentation rate   COMPLETE METABOLIC PANEL WITHOUT GFR   Glia (IgA/G) + tTG IgA   Celiac Ab tTG DGP TIgA   TSH   No orders of the defined types were placed in this encounter.    I reviewed the patients updated PMH, FH, and SocHx.  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/02/2019   High risk medication use 04/01/2019   Pulmonary nodule 08/21/2017   Immunosuppressed status (HCC) 03/05/2017   Class II obesity  10/09/2016   Nephrolithiasis 08/21/2016   Gait disturbance 04/17/2016   Tingling 04/17/2016   Snoring 04/17/2016   Urinary urgency 04/17/2016   Excessive sleepiness 04/17/2016   Allergic rhinitis 04/27/2014   Low back pain 04/27/2014   Diabetes mellitus type II, controlled (HCC) 11/27/2012   Depression 02/25/2008   Hyperlipidemia 08/29/2006   Relapsing remitting multiple sclerosis (HCC) 07/05/2006   Essential hypertension 07/05/2006   Endometriosis 07/05/2006   Current Meds  Medication Sig   amLODipine  (NORVASC ) 5 MG tablet Take 1 tablet (5 mg total) by mouth daily.   atorvastatin  (LIPITOR) 20 MG tablet Take 1 tablet (20 mg total) by mouth daily.   Continuous Blood Gluc Receiver (FREESTYLE LIBRE 2 READER) DEVI 1 each by Does not apply route daily.   Continuous Glucose Sensor (FREESTYLE LIBRE 2 SENSOR) MISC 1 each by Does not apply route every 14 (fourteen) days.   DULoxetine  (CYMBALTA ) 60 MG capsule TAKE 1 CAPSULE DAILY   hydrochlorothiazide  (HYDRODIURIL ) 25 MG tablet TAKE 1 TABLET BY MOUTH DAILY   insulin  glargine (LANTUS  SOLOSTAR) 100 UNIT/ML Solostar Pen inject 38 Units into the skin at bedtime.   Insulin  Lispro-aabc (LYUMJEV  KWIKPEN) 200 UNIT/ML KwikPen INJECT 10-20 UNITS INTO THE SKIN 3 (THREE) TIMES DAILY BEFORE MEALS.   Insulin  Pen Needle 32G X 4 MM MISC Use 4x a day   levocetirizine (XYZAL) 5 MG tablet Take 5 mg by mouth every evening.   levonorgestrel-ethinyl estradiol (LYBREL,AMETHYST) 90-20 MCG tablet Take 1 tablet by mouth daily.    Melatonin 5 MG CAPS Take by mouth.   modafinil  (PROVIGIL ) 200 MG tablet Take 1 tablet (200 mg total) by mouth daily as needed.   Naproxen Sodium 220 MG CAPS  Take 440 mg by mouth 2 (two) times daily as needed (for pain.).    natalizumab  (TYSABRI ) 300 MG/15ML injection Inject 300 mg into the vein every 30 (thirty) days.   Probiotic Product (PROBIOTIC PO) Take by mouth.   Semaglutide , 2 MG/DOSE, (OZEMPIC , 2 MG/DOSE,) 8 MG/3ML SOPN INJECT  SUBCUTANEOUSLY 2 MG EVERY WEEK AS DIRECTED   tirzepatide  (MOUNJARO ) 5 MG/0.5ML Pen Inject 5 mg into the skin once a week.   traMADol  (ULTRAM ) 50 MG tablet Take 1 tablet (50 mg total) by mouth every 6 (six) hours as needed for moderate pain (pain score 4-6).   Allergies: Patient is allergic to ace inhibitors. Family History: Patient family history includes Breast cancer in her mother; Diabetes in her father and mother; Other in her father. Social History:  Patient  reports that she has never smoked. She has never used smokeless tobacco. She reports current alcohol use. She reports that she does not use drugs.  Review of Systems: Constitutional: Negative for fever malaise or anorexia Cardiovascular: negative for chest pain Respiratory: negative for SOB or persistent cough Gastrointestinal: negative for abdominal pain  Objective  Vitals: BP 137/86   Pulse 83   Temp 97.9 F (36.6 C)   Ht 5' 6 (1.676 m)   Wt 211 lb 6.4 oz (95.9 kg)   SpO2 98%   BMI 34.12 kg/m  General: no acute distress , A&Ox3, appears well HEENT: PEERL, conjunctiva normal, neck is supple Cardiovascular:  RRR without murmur or gallop.  Respiratory:  Good breath sounds bilaterally, CTAB with normal respiratory effort Abdomen: Soft, mild bloating, nontender, normal bowel sounds, no masses Skin:  Warm, no rashes  Commons side effects, risks, benefits, and alternatives for medications and treatment plan prescribed today were discussed, and the patient expressed understanding of the given instructions. Patient is instructed to call or message via MyChart if he/she has any questions or concerns regarding our treatment plan. No barriers to understanding were identified. We discussed Red Flag symptoms and signs in detail. Patient expressed understanding regarding what to do in case of urgent or emergency type symptoms.  Medication list was reconciled, printed and provided to the patient in AVS. Patient instructions and  summary information was reviewed with the patient as documented in the AVS. This note was prepared with assistance of Dragon voice recognition software. Occasional wrong-word or sound-a-like substitutions may have occurred due to the inherent limitations of voice recognition software

## 2023-09-18 NOTE — Patient Instructions (Addendum)
 Please follow up if symptoms do not improve or as needed.   I recommend considering a follow-up with your PCP, endocrinologist and gastroenterologist.  Your gastroenterologist is Glendia Holt, MD:  [phone]    VISIT SUMMARY: During your visit, we discussed your chronic diarrhea and gastrointestinal issues, which have been ongoing for over 20 years. We also reviewed your diabetes and hypertension management. We have made some adjustments to your medications and planned further evaluations to better understand and manage your symptoms.  YOUR PLAN: -CHRONIC DIARRHEA WITH URGENCY AND WEIGHT LOSS: Chronic diarrhea with urgency and weight loss can be caused by multiple factors, including infections, postinfectious complications including bacterial overgrowth syndromes and inflammation, medications, and underlying conditions like irritable bowel syndrome. We will conduct stool studies to rule out infections, hold metformin  for six weeks, reduce your Ozempic  dose to 1 mg, continue your probiotics, and use Imodium as needed. We will also refer you to your primary care physician for further management and recommend that you see your gastroenterologist.  -TYPE 2 DIABETES MELLITUS: Type 2 diabetes mellitus is a condition where your body does not use insulin  properly, leading to high blood sugar levels. Your recent A1c was 7.1. We will hold metformin  for six weeks, reduce your Ozempic  dose to 1 mg, and ask you to monitor your blood glucose levels regularly. We will coordinate your diabetes management with your endocrinologist.  -HYPERTENSION: Hypertension is high blood pressure. Your blood pressure is currently well-controlled, so we will continue to hold carvedilol  as long as it remains stable. Please monitor your blood pressure regularly and coordinate with your primary care physician for ongoing management.  INSTRUCTIONS: Please follow up with your primary care physician for further management and a  potential referral to a gastroenterologist. Check MyChart for stool study results or expect a call with the results. Consider a follow-up with a gastroenterologist if your symptoms persist.                      Contains text generated by [employer]bridge.                                 Contains text generated by Abridge.

## 2023-09-20 ENCOUNTER — Ambulatory Visit: Payer: Self-pay | Admitting: Family Medicine

## 2023-09-20 LAB — CELIAC AB TTG DGP TIGA
IgA/Immunoglobulin A, Serum: 202 mg/dL (ref 87–352)
Tissue Transglut Ab: <2 U/mL (ref 0–5)

## 2023-09-20 LAB — GLIA (IGA/G) + TTG IGA
Antigliadin Abs, IgA: 2 U (ref 0–19)
Gliadin IgG: 1 U (ref 0–19)
Transglutaminase IgA: <2 U/mL (ref 0–3)

## 2023-09-20 NOTE — Progress Notes (Signed)
 Dr. Katrinka, RICK.  This patient followed up with me.  She may need to get back with gastroenterology.  Stool studies are still pending. Dear Ms. Deleon, Your blood test overall look good however your liver enzymes are mildly elevated.  I am not certain if this is related to your symptoms of diarrhea or not.  At this time, please hold your atorvastatin  and follow-up with Dr. Katrinka for recheck as we discussed.  Your celiac testing is all negative at this time.  I am awaiting your stool studies. Sincerely, Dr. Jodie

## 2023-09-24 ENCOUNTER — Ambulatory Visit (INDEPENDENT_AMBULATORY_CARE_PROVIDER_SITE_OTHER): Admitting: Family Medicine

## 2023-09-24 ENCOUNTER — Encounter: Payer: Self-pay | Admitting: Family Medicine

## 2023-09-24 VITALS — BP 118/78 | HR 79 | Temp 97.2°F | Ht 66.0 in | Wt 210.6 lb

## 2023-09-24 DIAGNOSIS — E119 Type 2 diabetes mellitus without complications: Secondary | ICD-10-CM | POA: Diagnosis not present

## 2023-09-24 DIAGNOSIS — R7989 Other specified abnormal findings of blood chemistry: Secondary | ICD-10-CM | POA: Diagnosis not present

## 2023-09-24 DIAGNOSIS — Z7985 Long-term (current) use of injectable non-insulin antidiabetic drugs: Secondary | ICD-10-CM

## 2023-09-24 DIAGNOSIS — I10 Essential (primary) hypertension: Secondary | ICD-10-CM

## 2023-09-24 DIAGNOSIS — Z7984 Long term (current) use of oral hypoglycemic drugs: Secondary | ICD-10-CM

## 2023-09-24 NOTE — Progress Notes (Signed)
 Phone [phone] In person visit   Subjective:   Kristin Mathews is a 53 y.o. year old very pleasant female patient who presents for/with See problem oriented charting Chief Complaint  Patient presents with   Medical Management of Chronic Issues    Would like to discuss lab work from last week, elevated liver function; no alcohol consumption;    Discussed the use of AI scribe software for clinical note transcription with the patient, who gave verbal consent to proceed.  History of Present Illness   Kristin Mathews is a 53 year old female with multiple sclerosis who presents for follow-up of gastrointestinal symptoms after prior salmonella and norovirus infection and medication management.  She has been experiencing gastrointestinal symptoms, including diarrhea with urgency and weight loss, since early july 2025. Previously diagnosed with norovirus and salmonella gastroenteritis, her condition may have been complicated by immunosuppression due to multiple sclerosis treatment with Tysabri . A prior GI pathogen panel confirmed these infections, and a colonoscopy in February 2023 was reassuring, making inflammatory bowel disease less likely plus recent ESR reassuring   She has seen Dr. Jodie twice in this time most recently september 3rd. She held metformin  and reduced the dose of Ozempic  to 1 mg from 2 mg, which led to improvement in her symptoms she reports today. She also stopped carvedilol , which further alleviated her gastrointestinal issues. Recently, she stopped atorvastatin  due to elevated liver enzymes (AST 94, ALT 137) as of last night  Her blood sugar levels have increased slightly since holding metformin  and reducing Ozempic , with fasting levels around 150 mg/dL. She has lost 15 pounds since May and notes an improvement in her appetite and taste for food over the past week with ozempic . She experienced a light menstrual period last month after stopping birth control pills, which she had  been using to manage menopausal symptoms. mild upper abdominal pain with palpation but otherwise no pacn.   No recent diarrhea since Monday, no significant abdominal pain unless pressed, and no fever. She has a family history of gallbladder issues, as both her mother and sister have had their gallbladders removed.        Past Medical History-  Patient Active Problem List   Diagnosis Date Noted   Pulmonary nodule 08/21/2017    Priority: High   Immunosuppressed status (HCC) 03/05/2017    Priority: High   Diabetes mellitus type II, controlled (HCC) 11/27/2012    Priority: High   Relapsing remitting multiple sclerosis (HCC) 07/05/2006    Priority: High   Nephrolithiasis 08/21/2016    Priority: Medium    Depression 02/25/2008    Priority: Medium    Hyperlipidemia 08/29/2006    Priority: Medium    Essential hypertension 07/05/2006    Priority: Medium    Gait disturbance 04/17/2016    Priority: Low   Tingling 04/17/2016    Priority: Low   Snoring 04/17/2016    Priority: Low   Urinary urgency 04/17/2016    Priority: Low   Excessive sleepiness 04/17/2016    Priority: Low   Allergic rhinitis 04/27/2014    Priority: Low   Low back pain 04/27/2014    Priority: Low   Endometriosis 07/05/2006    Priority: Low   Major depressive disorder with single episode, in full remission (HCC) 07/22/2020   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 90 tablet 3   Continuous Blood Gluc Receiver (FREESTYLE LIBRE 2 READER) DEVI 1 each by Does not apply route daily. 1 each 3   Continuous Glucose Sensor (FREESTYLE LIBRE 2 SENSOR) MISC 1 each by Does not apply route every 14 (fourteen) days. 6 each 3   DULoxetine  (CYMBALTA ) 60 MG  capsule TAKE 1 CAPSULE DAILY 90 capsule 3   hydrochlorothiazide  (HYDRODIURIL ) 25 MG tablet TAKE 1 TABLET BY MOUTH DAILY 90 tablet 3   insulin  glargine (LANTUS  SOLOSTAR) 100 UNIT/ML Solostar Pen inject 38 Units into the skin at bedtime. 45 mL 2   Insulin  Lispro-aabc (LYUMJEV  KWIKPEN) 200 UNIT/ML KwikPen INJECT 10-20 UNITS INTO THE SKIN 3 (THREE) TIMES DAILY BEFORE MEALS. 12 mL 3   Insulin  Pen Needle 32G X 4 MM MISC Use 4x a day 400 each 3   levocetirizine (XYZAL) 5 MG tablet Take 5 mg by mouth every evening.     Melatonin 5 MG CAPS Take by mouth.     modafinil  (PROVIGIL ) 200 MG tablet Take 1 tablet (200 mg total) by mouth daily as needed. 90 tablet 1   Naproxen Sodium 220 MG CAPS Take 440 mg by mouth 2 (two) times daily as needed (for pain.).      natalizumab  (TYSABRI ) 300 MG/15ML injection Inject 300 mg into the vein every 30 (thirty) days.     Probiotic Product (PROBIOTIC PO) Take by mouth.     Semaglutide , 2 MG/DOSE, (OZEMPIC , 2 MG/DOSE,) 8 MG/3ML SOPN INJECT SUBCUTANEOUSLY 2 MG EVERY WEEK AS DIRECTED 9 mL 3   atorvastatin  (LIPITOR) 20 MG tablet Take 1 tablet (20 mg total) by mouth daily. (Patient not taking: Reported on 09/24/2023) 90 tablet 3   carvedilol  (COREG ) 12.5 MG tablet Take 1 tablet (12.5 mg total) by mouth 2 (two) times daily. (Patient not taking: Reported on 09/24/2023) 180 tablet 3   levonorgestrel-ethinyl estradiol (LYBREL,AMETHYST) 90-20 MCG tablet Take 1 tablet by mouth daily.  (Patient not taking: Reported on 09/24/2023)     metFORMIN  (GLUCOPHAGE -XR) 500 MG 24 hr tablet TAKE 2 TABLETS BY MOUTH TWICE  DAILY WITH MEALS (Patient not taking: Reported on 09/24/2023) 360 tablet 3   tirzepatide  (MOUNJARO ) 5 MG/0.5ML Pen Inject 5 mg into the skin once a week. (Patient not taking: Reported on 09/24/2023) 6 mL 3   traMADol  (ULTRAM ) 50 MG tablet Take 1 tablet (50 mg total) by mouth every 6 (six) hours as needed for moderate pain (pain score 4-6). (Patient not taking: Reported on 09/24/2023) 20 tablet 0    No current facility-administered medications for this visit.     Objective:  BP 118/78 (BP Location: Left Arm, Patient Position: Sitting, Cuff Size: Normal)   Pulse 79   Temp (!) 97.2 F (36.2 C) (Temporal)   Ht 5' 6 (1.676 m)   Wt 210 lb 9.6 oz (95.5 kg)   SpO2 98%   BMI 33.99 kg/m  Gen: NAD, resting comfortably CV: RRR no murmurs rubs or gallops Lungs: CTAB no crackles, wheeze, rhonchi ABDOMEN: Tenderness in the upper abdomen, worse on the right side/nondistended/normal bowel sounds. No rebound or guarding.  Ext: no edema Skin: warm, dry Neuro: grossly normal, moves all extremities   LABS GI Pathogen Panel: Detected norovirus and Salmonella (07/31/2023) Celiac Testing: Negative Sedimentation Rate: Not elevated TSH: Normal CBC: Within normal limits CMP: Elevated AST 94, ALT 137     Assessment and Plan      #  Recent norovirus and salmonella gastroenteritis with ongoing post-infectious gastrointestinal symptoms. Symptoms improved after discontinuation of metformin  and carvedilol  as well as reducing ozempic  from 2mg  to 1 mg. Upper abdominal pain present but not severe and only with palpation. No recent diarrhea since Monday. Celiac disease and inflammatory bowel disease less likely due to negative tests and prior colonoscopy.  No current need for repeat GI pathogen panel as diarrhea has resolved. - Follow up if new or worsening symptoms such as abdominal pain, recurrent diarrhea, or fever. - at least until repeat labs- Continue for now to hold the coreg , atorvastatin , metformin  and stay on reduced Ozempic . With how well you did with Ozempic - may try to increase that one first. If blood pressure remains controlled perhaps we can stay off of that.   #Elevated AST and ALT likely related to recent GI illness or medication effects. Atorvastatin  held to assess impact on liver enzymes. Levels not critically elevated, reducing concern for acute hepatitis such as A, B,C. Plan to  monitor for downward trend in liver enzymes. - Hold atorvastatin  until liver function tests normalize or at least improve - Recheck liver function tests in two weeks. - Ordered future CMP for liver function assessment.  #Type 2 diabetes (managed by endocrine) with recent medication adjustments including holding metformin  and reducing Ozempic  dose. Blood sugar levels slightly elevated in the morning, but not significantly concerning. Potential impact on diabetes management acknowledged. - Continue reduced Ozempic  dose from 2 mg to 1 mg - Monitor blood sugar levels. - Discuss diabetes management with endocrinologist at next appointment on October 2nd. - Order urine microalbumin test  with next labs  #Hypertension with recent medication adjustments including holding carvedilol  WHILE CONTINUING amlodipine  and hydrochlorothiazide . Blood pressure well-controlled during visit. Consider reintroducing carvedilol  once GI symptoms fully resolve if needed but BP looks great - Continue amlodipine  5 mg and hydrochlorothiazide  25 mg - Consider reintroducing carvedilol  once GI symptoms resolve.  #Multiple sclerosis on immunosuppressive therapy Multiple sclerosis managed with Tysabri  with reasonabel control  #Menstrual irregularity, perimenopausal Menstrual irregularity with recent light period after discontinuation of birth control pills. Monitoring for further menstrual changes as part of perimenopausal transition. upcoming gyn visit - Monitor menstrual cycle for further irregularities.     Recommended follow up: Return for next already scheduled visit or sooner if needed. Future Appointments  Date Time Provider Department Center  10/08/2023  9:00 AM [facility]  10/17/2023  4:00 PM Trixie File, MD [facility] None  10/24/2023  4:00 PM Sater, Charlie LABOR, MD [facility] None  01/23/2024 11:00 AM Katrinka Garnette KIDD, MD [facility]    Lab/Order associations:   ICD-10-CM    1. Elevated LFTs  R79.89 Comprehensive metabolic panel with GFR    2. Controlled type 2 diabetes mellitus without complication, without long-term current use of insulin  (HCC)  E11.9 Microalbumin / creatinine urine ratio    3. Long term current use of oral hypoglycemic drug  Z79.84     4. Long-term current use of injectable noninsulin antidiabetic medication  Z79.85     5. Essential hypertension  I10       No orders of the defined types were placed in this encounter.   Return precautions advised.  Garnette Katrinka, MD

## 2023-09-24 NOTE — Patient Instructions (Addendum)
 Come back for labs in about 2 weeks- schedule at checkout  Follow up if new or worsening symptoms such as abdominal pain or recurrent diarrhea or fever  Continue for now to hold the coreg , atorvastatin , metformin  and stay on reduced Ozempic . With how well you did with Ozempic - may try to increase that one first. If blood pressure remains controlled perhaps we can stay off of that.   Recommended follow up: Return for next already scheduled visit or sooner if needed.

## 2023-10-08 ENCOUNTER — Other Ambulatory Visit (INDEPENDENT_AMBULATORY_CARE_PROVIDER_SITE_OTHER)

## 2023-10-08 ENCOUNTER — Ambulatory Visit: Payer: Self-pay | Admitting: Family Medicine

## 2023-10-08 DIAGNOSIS — R7989 Other specified abnormal findings of blood chemistry: Secondary | ICD-10-CM

## 2023-10-08 LAB — COMPREHENSIVE METABOLIC PANEL WITH GFR
ALT: 117 U/L — ABNORMAL HIGH (ref 0–35)
AST: 80 U/L — ABNORMAL HIGH (ref 0–37)
Albumin: 4.1 g/dL (ref 3.5–5.2)
Alkaline Phosphatase: 131 U/L — ABNORMAL HIGH (ref 39–117)
BUN: 11 mg/dL (ref 6–23)
CO2: 31 meq/L (ref 19–32)
Calcium: 9.5 mg/dL (ref 8.4–10.5)
Chloride: 98 meq/L (ref 96–112)
Creatinine, Ser: 0.98 mg/dL (ref 0.40–1.20)
GFR: 65.58 mL/min (ref >60.00)
Glucose, Bld: 159 mg/dL — ABNORMAL HIGH (ref 70–99)
Potassium: 3.8 meq/L (ref 3.5–5.1)
Sodium: 138 meq/L (ref 135–145)
Total Bilirubin: 0.5 mg/dL (ref 0.2–1.2)
Total Protein: 6.9 g/dL (ref 6.0–8.3)

## 2023-10-09 ENCOUNTER — Other Ambulatory Visit: Payer: Self-pay | Admitting: Family Medicine

## 2023-10-09 DIAGNOSIS — R7989 Other specified abnormal findings of blood chemistry: Secondary | ICD-10-CM

## 2023-10-17 ENCOUNTER — Encounter: Payer: Self-pay | Admitting: Internal Medicine

## 2023-10-17 ENCOUNTER — Other Ambulatory Visit: Payer: Self-pay | Admitting: Neurology

## 2023-10-17 ENCOUNTER — Ambulatory Visit: Admitting: Internal Medicine

## 2023-10-17 VITALS — BP 120/60 | HR 91 | Ht 66.0 in | Wt 212.4 lb

## 2023-10-17 DIAGNOSIS — E785 Hyperlipidemia, unspecified: Secondary | ICD-10-CM | POA: Diagnosis not present

## 2023-10-17 DIAGNOSIS — E1165 Type 2 diabetes mellitus with hyperglycemia: Secondary | ICD-10-CM | POA: Diagnosis not present

## 2023-10-17 DIAGNOSIS — E1159 Type 2 diabetes mellitus with other circulatory complications: Secondary | ICD-10-CM | POA: Diagnosis not present

## 2023-10-17 DIAGNOSIS — Z794 Long term (current) use of insulin: Secondary | ICD-10-CM

## 2023-10-17 DIAGNOSIS — E66812 Obesity, class 2: Secondary | ICD-10-CM | POA: Diagnosis not present

## 2023-10-17 DIAGNOSIS — Z7985 Long-term (current) use of injectable non-insulin antidiabetic drugs: Secondary | ICD-10-CM | POA: Diagnosis not present

## 2023-10-17 LAB — POCT GLYCOSYLATED HEMOGLOBIN (HGB A1C): Hemoglobin A1C: 7.3 % — AB (ref 4.0–5.6)

## 2023-10-17 MED ORDER — FREESTYLE LIBRE 3 PLUS SENSOR MISC: 1.0000 | 3 refills | Status: DC

## 2023-10-17 MED ORDER — LANTUS SOLOSTAR 100 UNIT/ML ~~LOC~~ SOPN: PEN_INJECTOR | SUBCUTANEOUS | 2 refills | Status: AC

## 2023-10-17 MED ORDER — FREESTYLE LIBRE 3 READER DEVI: 1.0000 | Freq: Once | 0 refills | Status: AC

## 2023-10-17 NOTE — Progress Notes (Signed)
 Patient ID: Kristin Mathews, female   DOB: Apr 10, 1970, 53 y.o.   MRN: [id number]  HPI: Kristin Mathews is a 53 y.o.-year-old female, presenting for f/u for DM2, dx 2009, insulin -dependent since 2009, uncontrolled, with complications (diabetic retinopathy). Last visit 5 months ago. She now has [employer].  Her co-pays are higher.  Interim history: No increased urination, blurry vision, nausea, chest pain. She came off Metformin  and Ozempic  dose was decreased to 1 mg weekly - over the summer after an episode of gastroenteritis with salmonella and norovirus in 07/2023. Celiac tests negative. Taking probiotics.  Her liver tests have been elevated and PCP is keeping an eye on them.  They are improving.  She is feeling much better now.  Reviewed HbA1c levels: Lab Results  Component Value Date   HGBA1C 7.2 (A) 05/16/2023   HGBA1C 7.2 (A) 10/23/2022   HGBA1C 7.2 (A) 04/23/2022   HGBA1C 8.0 (A) 10/27/2021   HGBA1C 7.4 (A) 09/02/2020   HGBA1C 6.9 (A) 04/08/2020   HGBA1C 7.0 (A) 10/26/2019   HGBA1C 7.4 (A) 06/29/2019   HGBA1C 8.7 (A) 03/30/2019   HGBA1C 8.4 (A) 04/22/2018   HGBA1C 7.9 (A) 10/07/2017   HGBA1C 9 01/31/2017   HGBA1C 8.3 (H) 08/21/2016   HGBA1C 7.6 01/23/2016   HGBA1C 9.0 09/26/2015   HGBA1C 8.5 (H) 02/22/2015   HGBA1C 8.2 08/03/2014   HGBA1C 8.6 (H) 04/30/2014   HGBA1C 9.3 (H) 11/02/2013   HGBA1C 7.1 (H) 04/14/2013   HGBA1C 8.0 (H) 11/12/2012   HGBA1C 7.3 (H) 05/13/2012   HGBA1C 6.8 (H) 08/21/2011   HGBA1C 6.5 11/15/2010   HGBA1C 7.3 (H) 07/04/2010   HGBA1C 8.1 (H) 02/17/2010   HGBA1C 7.3 (H) 10/13/2009   HGBA1C 7.4 (H) 06/07/2009   HGBA1C 8.4 (H) 01/18/2009   HGBA1C 6.6 (H) 06/29/2008  03/09/2021: HbA1c 7.3%  She prev. Had frequent steroid courses for MS. Not recently.  Previously on: - Metformin  ER 1000 mg 2x a day - Victoza  1.8 mg daily in a.m. >> Ozempic  1 >> 2 mg weekly - VGo 40 - 1 click for 15g >> 10 g carbs >> 5 clicks per L and 8-10 clicks per D >>  VGo30 3-4 clicks before b'fast 5-6 clicks before lunch 9-11 clicks before dinner She was on Invokana  100 >> 300 >> had few yeast infections - stopped in 05/2013 and tried again in 2017 >> stopped again b/c yeast inf. She tried Farxiga  but stopped because yeast infection 04/2018 We stopped Humulin 70/30 25 units bid. Januvia - did not help.  Glumetza  not covered by insurance. She was previously on glipizide  XL in the morning, stopped 11/2019.  Currently on: - Metformin  ER 1000 mg 2x a day >> off now - Ozempic  2 mg weekly (was not able to change to Mounjaro  05/2023) >> 1 mg weekly - Lantus  30 >> 38 units at bedtime -  Lyumjev  before meals >> now pens - ICR 4  She checks her sugars more than 4 times a day with her freestyle libre CGM (with receiver):  Previously:  Previously:  Previously: Lowest CBG: 21!! (at night) ...40 >> 60s; she has hypoglycemia awareness in the 57s. Highest >300 >> .SABRA.240 >> 280.  Pt's meals are: - Breakfast: yoghurt + granola, cereals  - Lunch: sandwich, sushi, [place]  - Dinner: home cooked meal: stews, etc. - Snacks: 3-4   -No CKD, last BUN/creatinine:  Lab Results  Component Value Date   BUN 11 10/08/2023   CREATININE 0.98 10/08/2023  Lab Results  Component Value Date   MICRALBCREAT 13.8 08/29/2006  She developed angioedema from ACE inhibitors in the past.  -+ HL; last set of lipids: Lab Results  Component Value Date   CHOL 127 10/23/2022   HDL 39.40 10/23/2022   LDLCALC 44 10/23/2022   LDLDIRECT 125.0 08/21/2017   TRIG 219.0 (H) 10/23/2022   CHOLHDL 3 10/23/2022  On pravastatin  added back in 08/2017 >> but feels hungry and off >> stopped.  Tried Zocor  >> she did not like how she felt on it.   Tried Lipitor 20 mg daily >> now off.  - last eye exam was on 08/28/2022: no DR.  -+ Mild numbness and tingling in her feet.  Last foot exam 10/23/2022.  She also has a history of MS (neuro Dr Charlie Crete) - last 2 atacks: 02/2012, 2010; also  HTN, fatty liver, endometriosis. She had a kidney stone in 11/2016 (calcium  oxalate)- had litotripsy. She had a total of 20-25 kidney stones throughout her life. 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  She sees cardiology. She decreased Amlodipine  2/2 itching, leg swelling. She was started on a diuretic.  Beta-blocker has been increased - summer 2022.  ROS:  I reviewed pt's medications, allergies, PMH, social hx, family hx, and changes were documented in the history of present illness. Otherwise, unchanged from my initial visit note.  Past Medical History:  Diagnosis Date   Arthritis    mild   Depression    Diabetes mellitus    Type 2   Endometriosis    GERD (gastroesophageal reflux disease)    Heart murmur    History of kidney stones    Hyperlipidemia    Hypertension    Multiple sclerosis    Type II or unspecified type diabetes mellitus with peripheral circulatory disorders, uncontrolled(250.72) 11/27/2012   Past Surgical History:  Procedure Laterality Date   EXPLORATORY LAPAROTOMY     endometriosis   EXTRACORPOREAL SHOCK WAVE LITHOTRIPSY Right 11/19/2016   Procedure: RIGHT EXTRACORPOREAL SHOCK WAVE LITHOTRIPSY (ESWL);  Surgeon: Nieves Cough, MD;  Location: [facility];  Service: Urology;  Laterality: Right;   LUMBAR DISC SURGERY     LS spine 2005 or so   WISDOM TOOTH EXTRACTION     Social History   Socioeconomic History   Marital status: Single    Spouse name: Not on file   Number of children: Not on file   Years of education: Not on file   Highest education level: Not on file  Occupational History   Not on file  Tobacco Use   Smoking status: Never   Smokeless tobacco: Never  Substance and Sexual Activity   Alcohol use: Yes    Alcohol/week: 0.0 standard drinks of alcohol    Comment: occasional   Drug use: No   Sexual activity: Not on file  Other Topics Concern   Not on file  Social History Narrative   Single. Lives  alone. No children. Pet cat.       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 90 tablet 3   atorvastatin  (LIPITOR) 20 MG tablet Take 1 tablet (20 mg total) by mouth daily. (Patient not taking: Reported on 09/24/2023) 90 tablet 3   carvedilol  (COREG ) 12.5 MG tablet Take 1 tablet (12.5 mg total) by mouth 2 (two) times daily. (Patient not taking: Reported on 09/24/2023) 180 tablet 3   Continuous Blood Gluc Receiver (FREESTYLE LIBRE 2 READER) DEVI 1 each by Does not apply route daily. 1 each 3   Continuous Glucose Sensor (FREESTYLE LIBRE 2 SENSOR) MISC 1 each by Does not apply route every 14 (fourteen) days. 6 each 3   DULoxetine  (CYMBALTA ) 60 MG capsule TAKE 1 CAPSULE DAILY 90 capsule 3   hydrochlorothiazide  (HYDRODIURIL ) 25 MG tablet TAKE 1 TABLET BY MOUTH DAILY 90 tablet 3   insulin  glargine (LANTUS  SOLOSTAR) 100 UNIT/ML Solostar Pen inject 38 Units into the skin at bedtime. 45 mL 2   Insulin  Lispro-aabc (LYUMJEV  KWIKPEN) 200 UNIT/ML KwikPen INJECT 10-20 UNITS INTO THE SKIN 3 (THREE) TIMES DAILY BEFORE MEALS. 12 mL 3   Insulin  Pen Needle 32G X 4 MM MISC Use 4x a day 400 each 3   levocetirizine (XYZAL) 5 MG tablet Take 5 mg by mouth every evening.     levonorgestrel-ethinyl estradiol (LYBREL,AMETHYST) 90-20 MCG tablet Take 1 tablet by mouth daily.  (Patient not  taking: Reported on 09/24/2023)     Melatonin 5 MG CAPS Take by mouth.     metFORMIN  (GLUCOPHAGE -XR) 500 MG 24 hr tablet TAKE 2 TABLETS BY MOUTH TWICE  DAILY WITH MEALS (Patient not taking: Reported on 09/24/2023) 360 tablet 3   modafinil  (PROVIGIL ) 200 MG tablet Take 1 tablet (200 mg total) by mouth daily as needed. 90 tablet 1   Naproxen Sodium 220 MG CAPS Take 440 mg by mouth 2 (two) times daily as needed (for pain.).      natalizumab  (TYSABRI ) 300 MG/15ML injection Inject 300 mg into the vein every 30 (thirty) days.     Probiotic Product (PROBIOTIC PO) Take by mouth.     Semaglutide , 2 MG/DOSE, (OZEMPIC , 2 MG/DOSE,) 8 MG/3ML SOPN INJECT SUBCUTANEOUSLY 2 MG EVERY WEEK AS DIRECTED 9 mL 3   tirzepatide  (MOUNJARO ) 5 MG/0.5ML Pen Inject 5 mg into the skin once a week. (Patient not taking: Reported on 09/24/2023) 6 mL 3   traMADol  (ULTRAM ) 50 MG tablet Take 1 tablet (50 mg total) by mouth every 6 (six) hours as needed for moderate pain (pain score 4-6). (Patient not taking: Reported on 09/24/2023) 20 tablet 0   No current facility-administered medications on file prior to visit.   Allergies  Allergen Reactions   Ace Inhibitors Swelling    ANGIOEDEMA   Family History  Problem Relation Age of Onset   Breast cancer Mother        early 74s   Diabetes Mother        maternal grandmother as well   Diabetes Father    Other Father        progressive supranuclear palsy   Colon cancer Neg Hx    Colon polyps Neg Hx    Esophageal cancer Neg Hx    Rectal cancer Neg Hx  Stomach cancer Neg Hx    PE: BP 120/60   Pulse 91   Ht 5' 6 (1.676 m)   Wt 212 lb 6.4 oz (96.3 kg)   SpO2 98%   BMI 34.28 kg/m   Wt Readings from Last 10 Encounters:  10/17/23 212 lb 6.4 oz (96.3 kg)  09/24/23 210 lb 9.6 oz (95.5 kg)  09/18/23 211 lb 6.4 oz (95.9 kg)  07/29/23 221 lb (100.2 kg)  05/16/23 227 lb 12.8 oz (103.3 kg)  03/21/23 227 lb (103 kg)  01/22/23 223 lb (101.2 kg)  10/23/22 220 lb 9.6 oz (100.1 kg)   09/11/22 220 lb (99.8 kg)  04/23/22 225 lb 3.2 oz (102.2 kg)   Constitutional: overweight, in NAD Eyes: EOMI, no exophthalmos ENT: no thyromegaly, no cervical lymphadenopathy Cardiovascular: RRR, No RG, +1/6 SEM Respiratory: CTA B Musculoskeletal: no deformities Skin: no rashes Neurological: no tremor with outstretched hands Diabetic Foot Exam - Simple   Simple Foot Form Diabetic Foot exam was performed with the following findings: Yes 10/17/2023  4:31 PM  Visual Inspection No deformities, no ulcerations, no other skin breakdown bilaterally: Yes Sensation Testing Intact to touch and monofilament testing bilaterally: Yes Pulse Check Posterior Tibialis and Dorsalis pulse intact bilaterally: Yes Comments    ASSESSMENT: 1. DM2, insulin -dependent, uncontrolled, with complications: - DR  2. Obesity class 2  3. HL  PLAN:  1. Patient with uncontrolled type 2 diabetes, on metformin , GLP-1 receptor agonist and basal/bolus insulin  regimen, with suboptimal control exacerbated by steroid courses for MS and also traveling for work.  At last visit, HbA1c was stable, at 7.2%, above target.  She was on the CeQur simplicity plan but she did not like it too much and before last visit she switched to insulin  pens.  Reviewing the CGM tracings at that time, sugars appears to be better controlled during the day but they were increasing significantly at night.  She was attributing the higher blood sugars overnight to eating more sweets after dinner including N ice cream.  We discussed about healthier alternatives but I did advise her that if she did have dessert, she probably needed a few more units of mealtime insulin  before the respective meal.  We also discussed about possibly switching from Ozempic  to Mounjaro  for stronger effect on blood sugars.  She was not able to start this. CGM interpretation: -At today's visit, we reviewed her CGM downloads: It appears that 89% of values are in target range  (goal >70%), while 8% are higher than 180 (goal <25%), and 3% are lower than 70 (goal <4%).  The calculated average blood sugar is 127.  The projected HbA1c for the next 3 months (GMI) is 6.1-6.2%%. -Reviewing the CGM trends, sugars appear to be mostly fluctuating within the target range, but with lower values, sometimes even under 70s in the late afternoon, before dinner.  I believe that these are related to taking too much insulin  for lunch.  I advised her to relax her insulin  to carb ratio with this meal.  Sugars are increasing after dinner, and I believe that this would improve after we eliminate the drops in blood sugars before this meal.  She did have some low blood sugars after dinner so I am reticent to increase her insulin  with this meal for now.  Since some of her blood sugars before breakfast are also low, and the sugars are decreasing overnight, I advised her to decrease the dose of her Lantus . -For now, since she mentions that she  fell very good from her GI point of view, I did not recommend to add back metformin  or to increase the dose of Ozempic  back to 2 mg weekly.  The HbA1c obtained today does not align very well with her blood sugars at home. -  I advised her to:  Patient Instructions  Please continue: - Ozempic  1 mg weekly - Lyumjev  pens before meals - insulin  to carb ratio 1:4, but 1:5 with lunch  Please decrease: - Lantus  32 units at bedtime   Please return in 4-6 months.  - we checked her HbA1c: 7.3% (higher) - advised to check sugars at different times of the day - 4x a day, rotating check times - advised for yearly eye exams >> she is UTD - return to clinic in 4-6 months  2. Obesity class 2 - She continues Ozempic  which should help with weight loss, also.  She was not able to switch to Mounjaro  as recommended at last visit. - Weight was stable at last visit but she lost 15 pounds since then!  Most of these were lost during her gastroenteritis episode, but she did not  gain a significant amount of weight afterwards  3. HL - Latest lipid panel was reviewed from 10/2022: LDL at goal, triglycerides elevated: Lab Results  Component Value Date   CHOL 127 10/23/2022   HDL 39.40 10/23/2022   LDLCALC 44 10/23/2022   LDLDIRECT 125.0 08/21/2017   TRIG 219.0 (H) 10/23/2022   CHOLHDL 3 10/23/2022  -She tried pravastatin  and simvastatin  but she did not like how this made her feel.  At last visit she was on Lipitor 20 grams daily, now off since she had gastroenteritis and then LFT elevation during the summer.  Lela Fendt, MD PhD [facility]

## 2023-10-17 NOTE — Patient Instructions (Addendum)
 Please continue: - Ozempic  1 mg weekly - Lyumjev  pens before meals - insulin  to carb ratio 1:4, but 1:5 with lunch  Please decrease: - Lantus  32 units at bedtime   Please return in 4-6 months.

## 2023-10-21 NOTE — Telephone Encounter (Signed)
 Last seen on 03/21/23 Follow up scheduled on 10/24/23    Dispensed Days Supply Quantity Provider Pharmacy  MODAFINIL   200 MG TABS 03/21/2023 90 90 tablet Sater, Charlie LABOR, MD [facility]   Rx pending to be signed

## 2023-10-23 ENCOUNTER — Other Ambulatory Visit: Payer: Self-pay | Admitting: Family Medicine

## 2023-10-24 ENCOUNTER — Encounter: Payer: Self-pay | Admitting: Neurology

## 2023-10-24 ENCOUNTER — Ambulatory Visit: Admitting: Neurology

## 2023-10-24 ENCOUNTER — Other Ambulatory Visit

## 2023-10-24 VITALS — BP 134/84 | HR 85 | Ht 66.0 in | Wt 211.0 lb

## 2023-10-24 DIAGNOSIS — Z79899 Other long term (current) drug therapy: Secondary | ICD-10-CM

## 2023-10-24 DIAGNOSIS — E119 Type 2 diabetes mellitus without complications: Secondary | ICD-10-CM

## 2023-10-24 DIAGNOSIS — G35A Relapsing-remitting multiple sclerosis: Secondary | ICD-10-CM

## 2023-10-24 DIAGNOSIS — R269 Unspecified abnormalities of gait and mobility: Secondary | ICD-10-CM

## 2023-10-24 DIAGNOSIS — Z794 Long term (current) use of insulin: Secondary | ICD-10-CM

## 2023-10-24 NOTE — Progress Notes (Signed)
 [facility]  PATIENT: Kristin Mathews DOB: 03/09/1970  REFERRING CLINICIAN: Dr. Tammie  HISTORY FROM: Patient  REASON FOR VISIT: MS   HISTORICAL  CHIEF COMPLAINT:  Chief Complaint  Patient presents with   RM11/MS    Pt is here Alone. Pt states that she was sick during the summer and lost 50 lbs in 2 months.     HISTORY OF PRESENT ILLNESS:  Kristin Mathews is a 53 y.o. woman with relapsing remitting MS dx 2001.  Update 10/24/2023 She feels her MS is stable.  She has no recent exacerbations and continues on Tysabri . And next infusion 11/06/2023.  She tolerates it well and has now been on for greater than 10 years..  She is JCV Ab negative (07/2023).    However, JCV has been borderline multiple times and we reduced her from every 4 weeks to every 6 weeks..   Her last brain MRI 01/20/2018 showed an overall small plaque burden.  We have no recent MRI of the cervical spine however, previous MRIs have shown 4 or 5 plaques in the cervical and thoracic spine..  Last month, she had noravirus and when she had a fever, her legs felt shaky.   LFTS were elevatated.     She has several meds including Lipitor and OCP on hold.  The norovirus was not associated with any traveling  She has noted mild worsening in her gait over the last few years.  She does have 4 or 5 spinal cord plaques.  She has a reduced balance and gait.  She keeps up with others for one mile but not longer.    She uses the banister on stairs.  She has had rare falls.  Strength is normal but if she walks far her legs start trembling some.    She reports normal sensation except for mild bilateral foot tingling (also has IDDM T2).       She has mild urinary urgency.   Vision function is ok but she has had diabetic retinopathy that is being followed closely.    She has fatigue and does better after nap.    She is sleeping well most nights (6-8 hours during the work week and 12-15 hours on weekends).   Modafinil  helps  some.  Due to HTN prefers not to try a stimulant.   She has had a PSG in the past and she had no OSA.    She denies any difficulties with cognition.  Her mood is fine.  She tested for BRCA and was negative (mom had breast cancer)  MS History: She presented with unilateral numbness in May 2001 and was diagnosed with transverse myelitis.    MRI only showed one spot and an LP was non-diagnostic.   She had another episode of numbness 6 months later and MRI showed several newer lesions.    She was placed on Rebif as part of the Rebiject study.     She had an exacerbation in 2007 and in 2010 with numbness and then optic neuritis.   In 2015, she had another numbness exacerbation and we decided to switch to Tysabri .    She tolerates Tysabri  well.    Imaging: MRI of the brain 01/20/2018 showed T2/flair hyperintense foci in the periventricular, juxtacortical and deep white matter.  These are consistent with chronic demyelinating plaque associated with multiple sclerosis though some of the foci are nonspecific.  MRI brain 12/29/2020 showed no new lesions  MRI cervical spine 12/29/2020 showed  T2  hyperintense foci are noted anteriorly at the cervicomedullary junction, posterior laterally to the left adjacent to C2-C3, posterolaterally to the right adjacent to C3-C4.  There is a possible subtle focus posteriorly to the left at C6-C7.   MRI thoracic spine 12/29/2020 showed T2 hyperintense focus within the spinal cord centrally adjacent to T7-T8.  This is nonspecific but is consistent with her diagnosis of multiple sclerosis.      No degenerative changes are noted    REVIEW OF SYSTEMS:  Constitutional: No fevers, chills, sweats, or change in appetite.   Has fatigue.  Has Poor sleep Eyes: No visual changes, double vision, eye pain Ear, nose and throat: No hearing loss, ear pain, nasal congestion, sore throat Cardiovascular: No chest pain, palpitations Respiratory:  No shortness of breath at rest or with  exertion.   No wheezes.   Snoring GastrointestinaI: No nausea, vomiting, diarrhea, abdominal pain.   Rare fecal incontinence Genitourinary:    She reports urgency with rare incontinence Musculoskeletal:  No neck pain, back pain Integumentary: No rash, pruritus, skin lesions Neurological: as above Psychiatric: No depression at this time.  No anxiety Endocrine: No palpitations, diaphoresis, change in appetite, change in weigh or increased thirst Hematologic/Lymphatic:  No anemia, purpura, petechiae. Allergic/Immunologic: No itchy/runny eyes, nasal congestion, recent allergic reactions, rashes  ALLERGIES: Allergies  Allergen Reactions   Ace Inhibitors Swelling    ANGIOEDEMA    HOME MEDICATIONS: Outpatient Medications Prior to Visit  Medication Sig Dispense Refill   amLODipine  (NORVASC ) 5 MG tablet Take 1 tablet (5 mg total) by mouth daily. 90 tablet 3   Continuous Glucose Sensor (FREESTYLE LIBRE 3 PLUS SENSOR) MISC 1 each by Does not apply route every 14 (fourteen) days. 6 each 3   DULoxetine  (CYMBALTA ) 60 MG capsule TAKE 1 CAPSULE DAILY 90 capsule 3   hydrochlorothiazide  (HYDRODIURIL ) 25 MG tablet TAKE 1 TABLET BY MOUTH DAILY 90 tablet 3   insulin  glargine (LANTUS  SOLOSTAR) 100 UNIT/ML Solostar Pen inject 32 Units into the skin at bedtime. 45 mL 2   Insulin  Lispro-aabc (LYUMJEV  KWIKPEN) 200 UNIT/ML KwikPen INJECT 10-20 UNITS INTO THE SKIN 3 (THREE) TIMES DAILY BEFORE MEALS. 12 mL 3   Insulin  Pen Needle 32G X 4 MM MISC Use 4x a day 400 each 3   levocetirizine (XYZAL) 5 MG tablet Take 5 mg by mouth every evening.     Melatonin 5 MG CAPS Take by mouth.     modafinil  (PROVIGIL ) 200 MG tablet TAKE 1 TABLET BY MOUTH DAILY AS  NEEDED 90 tablet 1   Naproxen Sodium 220 MG CAPS Take 440 mg by mouth 2 (two) times daily as needed (for pain.).      natalizumab  (TYSABRI ) 300 MG/15ML injection Inject 300 mg into the vein every 30 (thirty) days.     Probiotic Product (PROBIOTIC PO) Take by mouth.      Semaglutide , 2 MG/DOSE, (OZEMPIC , 2 MG/DOSE,) 8 MG/3ML SOPN INJECT SUBCUTANEOUSLY 2 MG EVERY WEEK AS DIRECTED 9 mL 3   atorvastatin  (LIPITOR) 20 MG tablet Take 1 tablet (20 mg total) by mouth daily. (Patient not taking: Reported on 10/24/2023) 90 tablet 3   levonorgestrel-ethinyl estradiol (LYBREL,AMETHYST) 90-20 MCG tablet Take 1 tablet by mouth daily.  (Patient not taking: Reported on 10/24/2023)     No facility-administered medications prior to visit.    PAST MEDICAL HISTORY: Past Medical History:  Diagnosis Date   Arthritis    mild   Depression    Diabetes mellitus    Type 2  Endometriosis    GERD (gastroesophageal reflux disease)    Heart murmur    History of kidney stones    Hyperlipidemia    Hypertension    Multiple sclerosis    Type II or unspecified type diabetes mellitus with peripheral circulatory disorders, uncontrolled(250.72) 11/27/2012    PAST SURGICAL HISTORY: Past Surgical History:  Procedure Laterality Date   EXPLORATORY LAPAROTOMY     endometriosis   EXTRACORPOREAL SHOCK WAVE LITHOTRIPSY Right 11/19/2016   Procedure: RIGHT EXTRACORPOREAL SHOCK WAVE LITHOTRIPSY (ESWL);  Surgeon: Nieves Cough, MD;  Location: [facility];  Service: Urology;  Laterality: Right;   LUMBAR DISC SURGERY     LS spine 2005 or so   WISDOM TOOTH EXTRACTION      FAMILY HISTORY: Family History  Problem Relation Age of Onset   Breast cancer Mother        early 55s   Diabetes Mother        maternal grandmother as well   Diabetes Father    Other Father        progressive supranuclear palsy   Colon cancer Neg Hx    Colon polyps Neg Hx    Esophageal cancer Neg Hx    Rectal cancer Neg Hx    Stomach cancer Neg Hx     SOCIAL HISTORY:  Social History   Socioeconomic History   Marital status: Single    Spouse name: Not on file   Number of children: Not on file   Years of education: Not on file   Highest education level: Not on file  Occupational History   Not on file   Tobacco Use   Smoking status: Never   Smokeless tobacco: Never  Substance and Sexual Activity   Alcohol use: Yes    Alcohol/week: 0.0 standard drinks of alcohol    Comment: occasional   Drug use: No   Sexual activity: Not on file  Other Topics Concern   Not on file  Social History Narrative   Single. Lives alone. No children. Pet cat.       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   General: The patient is well-developed and well-nourished and in no acute distress   Neurologic Exam  Mental status: The patient is alert and oriented x 3 at the time of the examination. The patient has apparent normal recent and remote memory, with an apparently normal attention span and concentration ability.   Speech is normal.  Cranial nerves: Extraocular movements are full.  Her vision and, vision is symmetric.SABRA There is mild reduced right facial sensation to soft touch.  Facial strength is normal.  Trapezius and sternocleidomastoid strength is normal. No dysarthria is noted.   no obvious  hearing deficits are noted.  Motor:  Muscle bulk and tone are normal. Strength is  5 / 5 in all 4 extremities.  No foot drop.  Sensory: Sensory testing is intact to pinprick, soft touch, vibration sensation, and position sense in her hands and knees but mildly  reduced vibration sensation in the toes  Coordination: Cerebellar testing reveals Good finger-to-nose and heel-to-shin bilaterally.  Gait and station: Station is normal.  The gait is mildly wide.  The tandem gait is wide.  Romberg is negative.  Reflexes: Deep tendon reflexes are symmetric and normal bilaterally     DIAGNOSTIC DATA (LABS, IMAGING, TESTING) - I reviewed patient records, labs, notes, testing and imaging myself where available.  Lab Results  Component Value Date   WBC 7.4 09/18/2023   HGB 13.5 09/18/2023   HCT 42.0 09/18/2023   MCV 85.8 09/18/2023   PLT 207.0 09/18/2023      Component Value Date/Time   NA 138 10/08/2023 0907   NA 138 12/31/2017 1421   K 3.8 10/08/2023 0907   CL 98 10/08/2023 0907   CO2 31 10/08/2023 0907   GLUCOSE 159 (H) 10/08/2023 0907   BUN 11 10/08/2023 0907   BUN 14 12/31/2017 1421   CREATININE 0.98 10/08/2023 0907   CREATININE 1.04 (H) 09/18/2023 0941   CALCIUM  9.5 10/08/2023 0907   PROT 6.9 10/08/2023 0907   PROT 6.6 12/31/2017 1421   ALBUMIN 4.1 10/08/2023 0907   ALBUMIN 4.2 12/31/2017 1421   AST 80 (H) 10/08/2023 0907   ALT 117 (H) 10/08/2023 0907   ALKPHOS 131 (H) 10/08/2023 0907   BILITOT 0.5 10/08/2023 0907   BILITOT 0.2 12/31/2017 1421   GFRNONAA 78 12/31/2017 1421   GFRNONAA >89 04/30/2014 1109   GFRAA 90 12/31/2017 1421   GFRAA >89 04/30/2014 1109   Lab Results  Component Value Date   CHOL 127 10/23/2022   HDL 39.40 10/23/2022   LDLCALC 44 10/23/2022   LDLDIRECT 125.0 08/21/2017   TRIG 219.0 (H) 10/23/2022   CHOLHDL 3 10/23/2022   Lab Results  Component Value Date   HGBA1C 7.3 (A) 10/17/2023   No results found for: VITAMINB12 Lab Results  Component Value Date   TSH 3.27 09/18/2023       ASSESSMENT AND PLAN  Relapsing remitting multiple sclerosis  High risk medication use  Gait disturbance  Diabetes mellitus type II, controlled (HCC)   1.   Continue Tysabri .  She is currently JCV ab  negative but always indeterminate so is doing the infusions every 6 weeks.  Over the past few years, she has had some mild worsening of her gait in a progressive manner.  I would still consider her relapsing remitting MS though she could be transitioning to an active secondary progressive MS..  We also discussed the BTK inhibitors might have some benefit for progressive aspects of MS and 1 could be available next year.  There was some liver safety issues so she may not be the best candidate and several other BTK inhibitors, hopefully with better tolerability/safety may also be available over the next couple of years. 2.   Stay active and exercise as tolerated.   3    continue Provigil  for hypersomnia  4.   she will return to see me in 6 months if stable or sooner if she notes other new or worsening symptoms.  Wendie Diskin A. Vear, MD, PhD 10/24/2023, 8:02 PM Certified in Neurology, Clinical Neurophysiology, Sleep Medicine, Pain Medicine and Neuroimaging  [facility]  [address], [address] [place], [place] [address] [phone]

## 2023-10-25 ENCOUNTER — Ambulatory Visit: Payer: Self-pay | Admitting: Internal Medicine

## 2023-10-25 LAB — MICROALBUMIN / CREATININE URINE RATIO
Creatinine, Urine: 199 mg/dL (ref 20–275)
Microalb Creat Ratio: 5 mg/g{creat} (ref <30)
Microalb, Ur: 1 mg/dL

## 2023-11-05 LAB — OPHTHALMOLOGY REPORT-SCANNED

## 2023-11-06 ENCOUNTER — Other Ambulatory Visit

## 2023-11-06 ENCOUNTER — Other Ambulatory Visit: Payer: Self-pay | Admitting: *Deleted

## 2023-11-06 ENCOUNTER — Other Ambulatory Visit: Payer: Self-pay

## 2023-11-06 ENCOUNTER — Telehealth: Payer: Self-pay

## 2023-11-06 DIAGNOSIS — Z79899 Other long term (current) drug therapy: Secondary | ICD-10-CM

## 2023-11-06 DIAGNOSIS — G35A Relapsing-remitting multiple sclerosis: Secondary | ICD-10-CM

## 2023-11-06 NOTE — Progress Notes (Signed)
Order placed per MD request

## 2023-11-06 NOTE — Telephone Encounter (Signed)
 Placed JCV in [facility] on 11/06/2023

## 2023-11-07 ENCOUNTER — Other Ambulatory Visit

## 2023-11-07 DIAGNOSIS — E119 Type 2 diabetes mellitus without complications: Secondary | ICD-10-CM

## 2023-11-07 DIAGNOSIS — R7989 Other specified abnormal findings of blood chemistry: Secondary | ICD-10-CM

## 2023-11-07 LAB — COMPREHENSIVE METABOLIC PANEL WITH GFR
ALT: 43 U/L — ABNORMAL HIGH (ref 0–35)
AST: 28 U/L (ref 0–37)
Albumin: 4.1 g/dL (ref 3.5–5.2)
Alkaline Phosphatase: 118 U/L — ABNORMAL HIGH (ref 39–117)
BUN: 16 mg/dL (ref 6–23)
CO2: 33 meq/L — ABNORMAL HIGH (ref 19–32)
Calcium: 9.2 mg/dL (ref 8.4–10.5)
Chloride: 98 meq/L (ref 96–112)
Creatinine, Ser: 0.93 mg/dL (ref 0.40–1.20)
GFR: 69.8 mL/min (ref >60.00)
Glucose, Bld: 155 mg/dL — ABNORMAL HIGH (ref 70–99)
Potassium: 4.1 meq/L (ref 3.5–5.1)
Sodium: 141 meq/L (ref 135–145)
Total Bilirubin: 0.4 mg/dL (ref 0.2–1.2)
Total Protein: 6.4 g/dL (ref 6.0–8.3)

## 2023-11-07 LAB — CBC WITH DIFFERENTIAL/PLATELET
Basophils Absolute: 0.1 x10E3/uL (ref 0.0–0.2)
Basos: 1 %
EOS (ABSOLUTE): 0.5 x10E3/uL — ABNORMAL HIGH (ref 0.0–0.4)
Eos: 5 %
Hematocrit: 44 % (ref 34.0–46.6)
Hemoglobin: 14.1 g/dL (ref 11.1–15.9)
Immature Grans (Abs): 0 x10E3/uL (ref 0.0–0.1)
Immature Granulocytes: 0 %
Lymphocytes Absolute: 4.8 x10E3/uL — ABNORMAL HIGH (ref 0.7–3.1)
Lymphs: 47 %
MCH: 28.5 pg (ref 26.6–33.0)
MCHC: 32 g/dL (ref 31.5–35.7)
MCV: 89 fL (ref 79–97)
Monocytes Absolute: 0.5 x10E3/uL (ref 0.1–0.9)
Monocytes: 5 %
Neutrophils Absolute: 4.4 x10E3/uL (ref 1.4–7.0)
Neutrophils: 42 %
Platelets: 305 x10E3/uL (ref 150–450)
RBC: 4.95 x10E6/uL (ref 3.77–5.28)
RDW: 15.2 % (ref 11.7–15.4)
WBC: 10.4 x10E3/uL (ref 3.4–10.8)

## 2023-11-07 LAB — MICROALBUMIN / CREATININE URINE RATIO
Creatinine,U: 172.2 mg/dL
Microalb Creat Ratio: 7.9 mg/g (ref 0.0–30.0)
Microalb, Ur: 1.4 mg/dL (ref 0.0–1.9)

## 2023-11-11 NOTE — Telephone Encounter (Signed)
 Faxed below form to [facility], received fax confirmation.

## 2023-11-13 ENCOUNTER — Other Ambulatory Visit: Payer: Self-pay

## 2023-11-13 DIAGNOSIS — E1159 Type 2 diabetes mellitus with other circulatory complications: Secondary | ICD-10-CM

## 2023-11-13 MED ORDER — FREESTYLE LIBRE 3 PLUS SENSOR MISC
3 refills | Status: AC
Start: 2023-11-13 — End: ?

## 2023-11-13 NOTE — Telephone Encounter (Signed)
 Kristin Mathews

## 2023-11-28 ENCOUNTER — Telehealth: Payer: Self-pay | Admitting: Pharmacy Technician

## 2023-11-28 NOTE — Telephone Encounter (Addendum)
 Pharmacy Patient Advocate Encounter   Received notification from CoverMyMeds that prior authorization for FreeStyle Libre 2 Sensor is due for renewal.   Insurance verification completed.   The patient is insured through [facility].  Action: Medication has been discontinued. Archived Key: [id number] **She is using Freestyle libre 3 plus.**

## 2023-12-10 ENCOUNTER — Encounter: Payer: Self-pay | Admitting: Neurology

## 2024-01-23 ENCOUNTER — Encounter: Payer: Self-pay | Admitting: Family Medicine

## 2024-01-23 ENCOUNTER — Ambulatory Visit: Payer: Self-pay | Admitting: Family Medicine

## 2024-01-23 ENCOUNTER — Ambulatory Visit: Payer: 59 | Admitting: Family Medicine

## 2024-01-23 VITALS — BP 128/80 | HR 94 | Temp 97.7°F | Ht 66.0 in | Wt 201.6 lb

## 2024-01-23 DIAGNOSIS — Z0000 Encounter for general adult medical examination without abnormal findings: Secondary | ICD-10-CM

## 2024-01-23 DIAGNOSIS — E785 Hyperlipidemia, unspecified: Secondary | ICD-10-CM

## 2024-01-23 DIAGNOSIS — N914 Secondary oligomenorrhea: Secondary | ICD-10-CM

## 2024-01-23 DIAGNOSIS — I10 Essential (primary) hypertension: Secondary | ICD-10-CM | POA: Diagnosis not present

## 2024-01-23 DIAGNOSIS — E119 Type 2 diabetes mellitus without complications: Secondary | ICD-10-CM

## 2024-01-23 DIAGNOSIS — Z794 Long term (current) use of insulin: Secondary | ICD-10-CM

## 2024-01-23 LAB — COMPREHENSIVE METABOLIC PANEL WITH GFR
ALT: 32 U/L (ref 3–35)
AST: 24 U/L (ref 5–37)
Albumin: 4.1 g/dL (ref 3.5–5.2)
Alkaline Phosphatase: 102 U/L (ref 39–117)
BUN: 18 mg/dL (ref 6–23)
CO2: 34 meq/L — ABNORMAL HIGH (ref 19–32)
Calcium: 9.3 mg/dL (ref 8.4–10.5)
Chloride: 100 meq/L (ref 96–112)
Creatinine, Ser: 0.86 mg/dL (ref 0.40–1.20)
GFR: 76.56 mL/min
Glucose, Bld: 149 mg/dL — ABNORMAL HIGH (ref 70–99)
Potassium: 3.3 meq/L — ABNORMAL LOW (ref 3.5–5.1)
Sodium: 140 meq/L (ref 135–145)
Total Bilirubin: 0.3 mg/dL (ref 0.2–1.2)
Total Protein: 6.9 g/dL (ref 6.0–8.3)

## 2024-01-23 LAB — CBC WITH DIFFERENTIAL/PLATELET
Basophils Absolute: 0.1 K/uL (ref 0.0–0.1)
Basophils Relative: 0.7 % (ref 0.0–3.0)
Eosinophils Absolute: 0.4 K/uL (ref 0.0–0.7)
Eosinophils Relative: 4.4 % (ref 0.0–5.0)
HCT: 40.2 % (ref 36.0–46.0)
Hemoglobin: 13.5 g/dL (ref 12.0–15.0)
Lymphocytes Relative: 46.4 % — ABNORMAL HIGH (ref 12.0–46.0)
Lymphs Abs: 4.3 K/uL — ABNORMAL HIGH (ref 0.7–4.0)
MCHC: 33.6 g/dL (ref 30.0–36.0)
MCV: 88.3 fl (ref 78.0–100.0)
Monocytes Absolute: 0.5 K/uL (ref 0.1–1.0)
Monocytes Relative: 5.7 % (ref 3.0–12.0)
Neutro Abs: 4 K/uL (ref 1.4–7.7)
Neutrophils Relative %: 42.8 % — ABNORMAL LOW (ref 43.0–77.0)
Platelets: 269 K/uL (ref 150.0–400.0)
RBC: 4.55 Mil/uL (ref 3.87–5.11)
RDW: 15.6 % — ABNORMAL HIGH (ref 11.5–15.5)
WBC: 9.3 K/uL (ref 4.0–10.5)

## 2024-01-23 LAB — LIPID PANEL
Cholesterol: 181 mg/dL (ref 28–200)
HDL: 44.3 mg/dL
LDL Cholesterol: 106 mg/dL — ABNORMAL HIGH (ref 10–99)
NonHDL: 136.76
Total CHOL/HDL Ratio: 4
Triglycerides: 155 mg/dL — ABNORMAL HIGH (ref 10.0–149.0)
VLDL: 31 mg/dL (ref 0.0–40.0)

## 2024-01-23 LAB — FOLLICLE STIMULATING HORMONE: FSH: 67.9 m[IU]/mL

## 2024-01-23 LAB — LUTEINIZING HORMONE: LH: 41.71 m[IU]/mL

## 2024-01-23 NOTE — Patient Instructions (Addendum)
 Please stop by lab before you go If you have mychart- we will send your results within 3 business days of us  receiving them.  If you do not have mychart- we will call you about results within 5 business days of us  receiving them.  *please also note that you will see labs on mychart as soon as they post. I will later go in and write notes on them- will say notes from Dr. Katrinka   I love your idea of starting regular exercise/stretching back in  Plans on COVID vaccine- send us  the date if you don't mind    Recommended follow up: Return in about 1 year (around 01/22/2025) for physical or sooner if needed.Schedule b4 you leave. Unless liver not improving or worsening or other lab concerns

## 2024-01-23 NOTE — Progress Notes (Signed)
 " Phone [phone]   Subjective:  Patient presents today for their annual physical. Chief complaint-noted.   See problem oriented charting- ROS- full  review of systems was completed and negative except for topics noted under acute/chronic concerns  The following were reviewed and entered/updated in epic: Past Medical History:  Diagnosis Date   Arthritis    mild   Depression    Diabetes mellitus    Type 2   Endometriosis    GERD (gastroesophageal reflux disease)    Heart murmur    History of kidney stones    Hyperlipidemia    Hypertension    Multiple sclerosis    Type II or unspecified type diabetes mellitus with peripheral circulatory disorders, uncontrolled(250.72) 11/27/2012   Patient Active Problem List   Diagnosis Date Noted   Pulmonary nodule 08/21/2017    Priority: High   Immunosuppressed status 03/05/2017    Priority: High   Diabetes mellitus type II, controlled (HCC) 11/27/2012    Priority: High   Relapsing remitting multiple sclerosis (HCC) 07/05/2006    Priority: High   Nephrolithiasis 08/21/2016    Priority: Medium    Depression 02/25/2008    Priority: Medium    Hyperlipidemia 08/29/2006    Priority: Medium    Essential hypertension 07/05/2006    Priority: Medium    Gait disturbance 04/17/2016    Priority: Low   Tingling 04/17/2016    Priority: Low   Snoring 04/17/2016    Priority: Low   Urinary urgency 04/17/2016    Priority: Low   Excessive sleepiness 04/17/2016    Priority: Low   Allergic rhinitis 04/27/2014    Priority: Low   Low back pain 04/27/2014    Priority: Low   Endometriosis 07/05/2006    Priority: Low   Major depressive disorder with single episode, in full remission 07/22/2020   Idiopathic medial aortopathy and arteriopathy (HCC) 12/02/2019   High risk medication use 04/01/2019   Class II obesity 10/09/2016   Past Surgical History:  Procedure Laterality Date   EXPLORATORY LAPAROTOMY     endometriosis   EXTRACORPOREAL  SHOCK WAVE LITHOTRIPSY Right 11/19/2016   Procedure: RIGHT EXTRACORPOREAL SHOCK WAVE LITHOTRIPSY (ESWL);  Surgeon: Nieves Cough, MD;  Location: [facility];  Service: Urology;  Laterality: Right;   LUMBAR DISC SURGERY     LS spine 2005 or so   WISDOM TOOTH EXTRACTION      Family History  Problem Relation Age of Onset   Breast cancer Mother        early 36s   Diabetes Mother        maternal grandmother as well   Diabetes Father    Other Father        progressive supranuclear palsy   Colon cancer Neg Hx    Colon polyps Neg Hx    Esophageal cancer Neg Hx    Rectal cancer Neg Hx    Stomach cancer Neg Hx     Medications- reviewed and updated Current Outpatient Medications  Medication Sig Dispense Refill   amLODipine  (NORVASC ) 5 MG tablet Take 1 tablet (5 mg total) by mouth daily. 90 tablet 3   Continuous Glucose Sensor (FREESTYLE LIBRE 3 PLUS SENSOR) MISC Change sensor every 15 days 6 each 3   DULoxetine  (CYMBALTA ) 60 MG capsule TAKE 1 CAPSULE DAILY 90 capsule 3   hydrochlorothiazide  (HYDRODIURIL ) 25 MG tablet TAKE 1 TABLET BY MOUTH DAILY 90 tablet 3   insulin  glargine (LANTUS  SOLOSTAR) 100 UNIT/ML Solostar Pen inject 32 Units into the  skin at bedtime. 45 mL 2   Insulin  Lispro-aabc (LYUMJEV  KWIKPEN) 200 UNIT/ML KwikPen INJECT 10-20 UNITS INTO THE SKIN 3 (THREE) TIMES DAILY BEFORE MEALS. 12 mL 3   Insulin  Pen Needle 32G X 4 MM MISC Use 4x a day 400 each 3   levocetirizine (XYZAL) 5 MG tablet Take 5 mg by mouth every evening.     Melatonin 5 MG CAPS Take by mouth.     modafinil  (PROVIGIL ) 200 MG tablet TAKE 1 TABLET BY MOUTH DAILY AS  NEEDED 90 tablet 1   Naproxen Sodium 220 MG CAPS Take 440 mg by mouth 2 (two) times daily as needed (for pain.).      natalizumab  (TYSABRI ) 300 MG/15ML injection Inject 300 mg into the vein every 30 (thirty) days.     Probiotic Product (PROBIOTIC PO) Take by mouth.     Semaglutide , 2 MG/DOSE, (OZEMPIC , 2 MG/DOSE,) 8 MG/3ML SOPN INJECT SUBCUTANEOUSLY 2 MG  EVERY WEEK AS DIRECTED 9 mL 3   atorvastatin  (LIPITOR) 20 MG tablet Take 1 tablet (20 mg total) by mouth daily. (Patient not taking: Reported on 01/23/2024) 90 tablet 3   No current facility-administered medications for this visit.    Allergies-reviewed and updated Allergies[1]  Social History   Social History Narrative   Single. Lives alone. No children. Pet cat.       Works: transitioned in 2024 to [employer] (much smaller)- challenging job   Prior Advertising Account Planner for 23 years: [employer], [employer]      Regular exercise: not lately   Caffeine use: daily; during to the week      Hobbies: travel, genealogy , read, paper craft   Objective  Objective:  BP 128/80 (BP Location: Left Arm, Patient Position: Sitting, Cuff Size: Normal)   Pulse 94   Temp 97.7 F (36.5 C) (Temporal)   Ht 5' 6 (1.676 m)   Wt 201 lb 9.6 oz (91.4 kg)   SpO2 98%   BMI 32.54 kg/m  Gen: NAD, resting comfortably HEENT: Mucous membranes are moist. Oropharynx normal Neck: no thyromegaly CV: RRR no murmurs rubs or gallops Lungs: CTAB no crackles, wheeze, rhonchi Abdomen: soft/nontender/nondistended/normal bowel sounds. No rebound or guarding.  Ext: minimal edema Skin: warm, dry Neuro: grossly normal, moves all extremities, PERRLA   Assessment and Plan   54 y.o. female presenting for annual physical.  Health Maintenance counseling: 1. Anticipatory guidance: Patient counseled regarding regular dental exams -q6 months, eye exams - yearly and requesting,  avoiding smoking and second hand smoke , limiting alcohol to 1 beverage per day- rare social , no illicit drugs .   2. Risk factor reduction:  Advised patient of need for regular exercise and diet rich and fruits and vegetables to reduce risk of heart attack and stroke.  Exercise- feels has room for improvement- has started doing some stretching while at home- wants to be more consistent.  Diet/weight management-weight down 22 pounds in the last year- she had  gastroenterology illness and after that stopped some medications and feels Ozempic  more helpful at this point- had been helping a1c but feels finally helping weight agian.  Wt Readings from Last 3 Encounters:  01/23/24 201 lb 9.6 oz (91.4 kg)  10/24/23 211 lb (95.7 kg)  10/17/23 212 lb 6.4 oz (96.3 kg)  3. Immunizations/screenings/ancillary studies-tolerated 2nd shingrix even though debilitating first one- otherwise up to date outside of planning on COVID vaccine Immunization History  Administered Date(s) Administered   INFLUENZA, HIGH DOSE SEASONAL PF 02/03/2020  Influenza Whole 10/14/2008   Influenza, Seasonal, Injecte, Preservative Fre 09/18/2023   Influenza,inj,Quad PF,6+ Mos 11/02/2013, 10/09/2016, 10/07/2017, 10/31/2020, 10/27/2021   Influenza-Unspecified 10/05/2019, 11/30/2022   Moderna Covid-19 Fall Seasonal Vaccine 34yrs & older 11/30/2022   PFIZER Comirnaty [place] Top)Covid-19 Tri-Sucrose Vaccine 10/27/2021   PFIZER(Purple Top)SARS-COV-2 Vaccination 04/10/2019, 05/01/2019, 05/27/2019, 10/02/2019, 06/17/2020   PNEUMOCOCCAL CONJUGATE-20 01/22/2023   Pfizer(Comirnaty )Fall Seasonal Vaccine 12 years and older 10/27/2021   Pneumococcal Polysaccharide-23 01/16/2007   Tdap 01/16/2007, 08/21/2017   Zoster, Unspecified 06/17/2020  4. Cervical cancer screening- continues to follow Dr. Vela on file March 2022 but we will request for any new exams 5. Breast cancer screening-  breast exam with GYN and mammogram -MRI breast 03/01/2023 ordered by Dr. Mat-  has upcoming visit 6. Colon cancer screening - 03/08/2021 with 10-year repeat planned 7. Skin cancer screening-does not see dermatology.advised regular sunscreen use. Denies worrisome, changing, or new skin lesions.  8. Birth control/STD check-off birth control right now. If active would use protection always 9. Osteoporosis screening at 65- bone densities with GYN-believe she has had 1 in past 10. Smoking associated screening -never  smoker  Status of chronic or acute concerns   #social update- with recession has been tougher- missing her big bonus -caring for aging mom has been very hard- also forgetful  #Light cycle in August off of birth control and hot flashes have been really bad- she stopped birth control after the gastroenterology illnesses she had. She plans to discuss further with Dr. Mat later this month.  -we mentioned Veozah if liver better  # Multiple sclerosis-follows with Dr. Vear S:Medication:  tysarbri 300mg  every 6 weeks with JCV antibodies indeterminate -he also has her on modafinil   for hypersomnolence- finds helpful A/P: overall stable- continue current medications    # Elevated LFTs peaked 09/18/2023 with AST of 94 and ALT of 137 trended down by October to 28 and 43-may have been related to prior GI illness and we also held her atorvastatin - she's still off of this- may be able to restart  # Diabetes- sees Dr. Trixie S: Medication:short and long-acting now, Ozempic  2 mg Lab Results  Component Value Date   HGBA1C 7.3 (A) 10/17/2023   HGBA1C 7.2 (A) 05/16/2023   HGBA1C 7.2 (A) 10/23/2022   A/P: close to ideal control 7 or less- continue close follow up with Dr. Trixie   #hypertension S: medication: Amlodipine  5 mg, hydrochlorothiazide  25 mg BP Readings from Last 3 Encounters:  01/23/24 128/80  10/24/23 134/84  10/17/23 120/60  A/P: well controlled continue current medications- better off coreg   #hyperlipidemia-mild coronary artery calcifications on chest CT S: Medication: Atorvastatin  20 mg  daily  in past A/P: update lipids- with prior calcium  on coronary arteries though mild- may be worth restarting even if at lower does if liver better today   # Depression S: Medication:Cymbalta  60 mg    01/23/2024   11:01 AM 09/24/2023   10:35 AM 09/18/2023    8:59 AM  Depression screen PHQ 2/9  Decreased Interest 0 0 0  Down, Depressed, Hopeless 0 0 0  PHQ - 2 Score 0 0 0  Altered sleeping 1  0   Tired, decreased energy 3 3   Change in appetite 0 2   Feeling bad or failure about yourself  0 0   Trouble concentrating 1 0   Moving slowly or fidgety/restless 0 0   Suicidal thoughts 0 0   PHQ-9 Score 5 5    Difficult doing work/chores Somewhat difficult  Somewhat difficult      Data saved with a previous flowsheet row definition  A/P: reasonable control but tough caregiver burden right now- continue to monitor    # Pulmonary nodule only 6 mm.  Segment of the right lower lobe-recommended repeatt CT of the chest in 1 year from 02/28/2023 but the way this was written appeared to be as if lung cancer screening program and she is not in that- this was for isolated nodule in 2022. We offered yearly or every other year- we were honestly hoping they would state no further CT- perhaps repeat next year after conversation -prior possible aneurysm was not noted thankfully  # Leukocytosis-Dr. Sherlyn has stated likely related to being on Tysabri   Recommended follow up: Return in about 1 year (around 01/22/2025) for physical or sooner if needed.Schedule b4 you leave. Future Appointments  Date Time Provider Department Center  04/21/2024  4:00 PM Trixie File, MD [id number] None  05/28/2024  4:00 PM Sater, Charlie LABOR, MD [facility] None   Lab/Order associations:NOT fasting   ICD-10-CM   1. Preventative health care  Z00.00     2. Controlled type 2 diabetes mellitus without complication, without long-term current use of insulin  (HCC)  E11.9     3. Controlled type 2 diabetes mellitus without complication, with long-term current use of insulin  (HCC)  E11.9 CBC with Differential/Platelet   Z79.4 Comprehensive metabolic panel    4. Essential hypertension  I10 CBC with Differential/Platelet    Comprehensive metabolic panel    5. Hyperlipidemia, unspecified hyperlipidemia type  E78.5 CBC with Differential/Platelet    Comprehensive metabolic panel    Lipid panel    6. Secondary oligomenorrhea  N91.4  FSH    LH    Estradiol       No orders of the defined types were placed in this encounter.   Return precautions advised.  Garnette Lukes, MD      [1]  Allergies Allergen Reactions   Ace Inhibitors Swelling    ANGIOEDEMA   "

## 2024-01-24 LAB — ESTRADIOL: Estradiol: <30 pg/mL

## 2024-01-27 ENCOUNTER — Other Ambulatory Visit: Payer: Self-pay | Admitting: Family Medicine

## 2024-01-27 MED ORDER — ATORVASTATIN CALCIUM 10 MG PO TABS: 10.0000 mg | ORAL_TABLET | Freq: Every day | ORAL | 3 refills | Status: AC

## 2024-02-03 ENCOUNTER — Other Ambulatory Visit: Payer: Self-pay

## 2024-02-03 DIAGNOSIS — R269 Unspecified abnormalities of gait and mobility: Secondary | ICD-10-CM

## 2024-02-03 DIAGNOSIS — G35A Relapsing-remitting multiple sclerosis: Secondary | ICD-10-CM

## 2024-02-03 DIAGNOSIS — Z79899 Other long term (current) drug therapy: Secondary | ICD-10-CM

## 2024-02-13 LAB — STRATIFY JCV(TM) AB W/INDEX
JCV Antibody by Inhibition: NEGATIVE
JCV Antibody: UNDETERMINED
JCV Index Value: 0.28

## 2024-02-18 ENCOUNTER — Other Ambulatory Visit: Payer: Self-pay | Admitting: Obstetrics and Gynecology

## 2024-02-18 DIAGNOSIS — R928 Other abnormal and inconclusive findings on diagnostic imaging of breast: Secondary | ICD-10-CM

## 2024-02-19 ENCOUNTER — Inpatient Hospital Stay
Admission: RE | Admit: 2024-02-19 | Discharge: 2024-02-19 | Attending: Obstetrics and Gynecology | Admitting: Obstetrics and Gynecology

## 2024-02-19 DIAGNOSIS — R928 Other abnormal and inconclusive findings on diagnostic imaging of breast: Secondary | ICD-10-CM

## 2024-02-20 ENCOUNTER — Other Ambulatory Visit: Payer: Self-pay | Admitting: Obstetrics and Gynecology

## 2024-02-20 DIAGNOSIS — R928 Other abnormal and inconclusive findings on diagnostic imaging of breast: Secondary | ICD-10-CM

## 2024-02-26 ENCOUNTER — Encounter

## 2024-04-21 ENCOUNTER — Ambulatory Visit: Admitting: Internal Medicine

## 2024-05-28 ENCOUNTER — Ambulatory Visit: Admitting: Neurology

## 2025-01-26 ENCOUNTER — Encounter: Admitting: Family Medicine
